# Patient Record
Sex: Male | Born: 1955 | Race: White | Hispanic: No | Smoking: Former smoker
Health system: Southern US, Community
[De-identification: ages and names within clinical notes are randomized; demographics above are authoritative.]

## PROBLEM LIST (undated history)

## (undated) DIAGNOSIS — G629 Polyneuropathy, unspecified: Secondary | ICD-10-CM

## (undated) DIAGNOSIS — E349 Endocrine disorder, unspecified: Secondary | ICD-10-CM

## (undated) DIAGNOSIS — T1491XA Suicide attempt, initial encounter: Secondary | ICD-10-CM

## (undated) DIAGNOSIS — E785 Hyperlipidemia, unspecified: Secondary | ICD-10-CM

## (undated) DIAGNOSIS — I10 Essential (primary) hypertension: Secondary | ICD-10-CM

## (undated) DIAGNOSIS — F329 Major depressive disorder, single episode, unspecified: Secondary | ICD-10-CM

## (undated) DIAGNOSIS — R0989 Other specified symptoms and signs involving the circulatory and respiratory systems: Secondary | ICD-10-CM

## (undated) DIAGNOSIS — A4902 Methicillin resistant Staphylococcus aureus infection, unspecified site: Secondary | ICD-10-CM

## (undated) DIAGNOSIS — E119 Type 2 diabetes mellitus without complications: Secondary | ICD-10-CM

## (undated) DIAGNOSIS — M25569 Pain in unspecified knee: Secondary | ICD-10-CM

## (undated) DIAGNOSIS — G8929 Other chronic pain: Secondary | ICD-10-CM

## (undated) DIAGNOSIS — F32A Depression, unspecified: Secondary | ICD-10-CM

## (undated) HISTORY — DX: Pain in unspecified knee: M25.569

## (undated) HISTORY — DX: Other specified symptoms and signs involving the circulatory and respiratory systems: R09.89

## (undated) HISTORY — DX: Methicillin resistant Staphylococcus aureus infection, unspecified site: A49.02

## (undated) HISTORY — DX: Essential (primary) hypertension: I10

## (undated) HISTORY — DX: Other chronic pain: G89.29

## (undated) HISTORY — PX: OTHER SURGICAL HISTORY: SHX169

## (undated) HISTORY — DX: Polyneuropathy, unspecified: G62.9

## (undated) HISTORY — DX: Suicide attempt, initial encounter: T14.91XA

## (undated) HISTORY — DX: Hyperlipidemia, unspecified: E78.5

## (undated) HISTORY — DX: Type 2 diabetes mellitus without complications: E11.9

## (undated) HISTORY — DX: Depression, unspecified: F32.A

## (undated) HISTORY — DX: Endocrine disorder, unspecified: E34.9

## (undated) HISTORY — DX: Major depressive disorder, single episode, unspecified: F32.9

---
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
## 1999-11-26 ENCOUNTER — Emergency Department (HOSPITAL_COMMUNITY): Admission: EM | Admit: 1999-11-26 | Discharge: 1999-11-26 | Payer: Self-pay | Admitting: Emergency Medicine

## 1999-11-28 ENCOUNTER — Encounter: Payer: Self-pay | Admitting: Urology

## 1999-11-28 ENCOUNTER — Encounter: Admission: RE | Admit: 1999-11-28 | Discharge: 1999-11-28 | Payer: Self-pay | Admitting: Urology

## 1999-12-05 ENCOUNTER — Encounter: Admission: RE | Admit: 1999-12-05 | Discharge: 1999-12-05 | Payer: Self-pay | Admitting: Urology

## 1999-12-05 ENCOUNTER — Encounter: Payer: Self-pay | Admitting: Urology

## 2000-02-03 ENCOUNTER — Encounter: Payer: Self-pay | Admitting: Urology

## 2000-02-03 ENCOUNTER — Inpatient Hospital Stay (HOSPITAL_COMMUNITY): Admission: EM | Admit: 2000-02-03 | Discharge: 2000-02-07 | Payer: Self-pay | Admitting: *Deleted

## 2000-02-04 ENCOUNTER — Encounter: Payer: Self-pay | Admitting: Urology

## 2000-02-18 ENCOUNTER — Ambulatory Visit (HOSPITAL_COMMUNITY): Admission: RE | Admit: 2000-02-18 | Discharge: 2000-02-18 | Payer: Self-pay | Admitting: Urology

## 2000-02-18 ENCOUNTER — Encounter: Payer: Self-pay | Admitting: Urology

## 2004-08-21 ENCOUNTER — Ambulatory Visit: Payer: Self-pay | Admitting: Family Medicine

## 2004-08-27 ENCOUNTER — Ambulatory Visit: Payer: Self-pay | Admitting: Family Medicine

## 2004-09-27 ENCOUNTER — Ambulatory Visit: Payer: Self-pay | Admitting: Family Medicine

## 2004-10-27 ENCOUNTER — Ambulatory Visit: Payer: Self-pay | Admitting: Family Medicine

## 2007-06-30 ENCOUNTER — Encounter: Payer: Self-pay | Admitting: Physician Assistant

## 2008-03-29 HISTORY — PX: COLONOSCOPY: SHX174

## 2008-04-06 ENCOUNTER — Encounter: Payer: Self-pay | Admitting: Internal Medicine

## 2008-04-06 ENCOUNTER — Ambulatory Visit (HOSPITAL_COMMUNITY): Admission: RE | Admit: 2008-04-06 | Discharge: 2008-04-06 | Payer: Self-pay | Admitting: Internal Medicine

## 2008-04-06 ENCOUNTER — Ambulatory Visit: Payer: Self-pay | Admitting: Internal Medicine

## 2008-12-10 ENCOUNTER — Observation Stay (HOSPITAL_COMMUNITY): Admission: EM | Admit: 2008-12-10 | Discharge: 2008-12-13 | Payer: Self-pay | Admitting: Emergency Medicine

## 2008-12-26 ENCOUNTER — Encounter: Payer: Self-pay | Admitting: Physician Assistant

## 2009-07-18 ENCOUNTER — Encounter: Payer: Self-pay | Admitting: Physician Assistant

## 2009-07-29 ENCOUNTER — Encounter: Payer: Self-pay | Admitting: Physician Assistant

## 2009-10-22 ENCOUNTER — Encounter: Payer: Self-pay | Admitting: Physician Assistant

## 2009-12-12 ENCOUNTER — Ambulatory Visit: Payer: Self-pay | Admitting: Family Medicine

## 2009-12-12 DIAGNOSIS — I739 Peripheral vascular disease, unspecified: Secondary | ICD-10-CM | POA: Insufficient documentation

## 2009-12-12 DIAGNOSIS — E785 Hyperlipidemia, unspecified: Secondary | ICD-10-CM | POA: Insufficient documentation

## 2009-12-12 DIAGNOSIS — I10 Essential (primary) hypertension: Secondary | ICD-10-CM | POA: Insufficient documentation

## 2009-12-12 DIAGNOSIS — E1169 Type 2 diabetes mellitus with other specified complication: Secondary | ICD-10-CM | POA: Insufficient documentation

## 2009-12-12 DIAGNOSIS — E669 Obesity, unspecified: Secondary | ICD-10-CM | POA: Insufficient documentation

## 2009-12-12 LAB — HM DIABETES FOOT EXAM

## 2009-12-13 ENCOUNTER — Encounter: Payer: Self-pay | Admitting: Physician Assistant

## 2009-12-19 ENCOUNTER — Telehealth: Payer: Self-pay | Admitting: Physician Assistant

## 2009-12-19 ENCOUNTER — Emergency Department (HOSPITAL_COMMUNITY): Admission: EM | Admit: 2009-12-19 | Discharge: 2009-12-19 | Payer: Self-pay | Admitting: Emergency Medicine

## 2009-12-20 ENCOUNTER — Ambulatory Visit: Payer: Self-pay | Admitting: Endocrinology

## 2009-12-20 ENCOUNTER — Telehealth: Payer: Self-pay | Admitting: Physician Assistant

## 2009-12-20 ENCOUNTER — Ambulatory Visit: Payer: Self-pay | Admitting: Family Medicine

## 2009-12-31 ENCOUNTER — Ambulatory Visit: Payer: Self-pay | Admitting: Endocrinology

## 2010-01-13 ENCOUNTER — Telehealth: Payer: Self-pay | Admitting: Family Medicine

## 2010-01-13 ENCOUNTER — Ambulatory Visit: Payer: Self-pay | Admitting: Family Medicine

## 2010-01-13 ENCOUNTER — Ambulatory Visit (HOSPITAL_COMMUNITY): Admission: RE | Admit: 2010-01-13 | Discharge: 2010-01-13 | Payer: Self-pay | Admitting: Family Medicine

## 2010-01-13 DIAGNOSIS — R0602 Shortness of breath: Secondary | ICD-10-CM | POA: Insufficient documentation

## 2010-01-13 DIAGNOSIS — M79609 Pain in unspecified limb: Secondary | ICD-10-CM | POA: Insufficient documentation

## 2010-01-13 DIAGNOSIS — I509 Heart failure, unspecified: Secondary | ICD-10-CM | POA: Insufficient documentation

## 2010-01-13 LAB — CONVERTED CEMR LAB
CO2: 18 meq/L — ABNORMAL LOW (ref 19–32)
Calcium: 8.9 mg/dL (ref 8.4–10.5)
Chloride: 106 meq/L (ref 96–112)
Creatinine, Ser: 0.87 mg/dL (ref 0.40–1.50)
Sodium: 140 meq/L (ref 135–145)

## 2010-01-14 ENCOUNTER — Ambulatory Visit (HOSPITAL_COMMUNITY): Admission: RE | Admit: 2010-01-14 | Discharge: 2010-01-14 | Payer: Self-pay | Admitting: Family Medicine

## 2010-01-21 ENCOUNTER — Telehealth: Payer: Self-pay | Admitting: Physician Assistant

## 2010-01-27 ENCOUNTER — Ambulatory Visit: Payer: Self-pay | Admitting: Family Medicine

## 2010-01-27 DIAGNOSIS — E291 Testicular hypofunction: Secondary | ICD-10-CM | POA: Insufficient documentation

## 2010-01-27 DIAGNOSIS — F341 Dysthymic disorder: Secondary | ICD-10-CM | POA: Insufficient documentation

## 2010-01-27 DIAGNOSIS — B029 Zoster without complications: Secondary | ICD-10-CM | POA: Insufficient documentation

## 2010-01-29 ENCOUNTER — Encounter: Payer: Self-pay | Admitting: Physician Assistant

## 2010-02-03 LAB — CONVERTED CEMR LAB
Cholesterol: 147 mg/dL (ref 0–200)
HDL: 36 mg/dL — ABNORMAL LOW (ref >39)
LDL Cholesterol: 62 mg/dL (ref 0–99)
Triglycerides: 246 mg/dL — ABNORMAL HIGH (ref <150)
VLDL: 49 mg/dL — ABNORMAL HIGH (ref 0–40)

## 2010-02-14 ENCOUNTER — Telehealth: Payer: Self-pay | Admitting: Physician Assistant

## 2010-02-28 ENCOUNTER — Ambulatory Visit: Payer: Self-pay | Admitting: Physician Assistant

## 2010-03-04 ENCOUNTER — Telehealth: Payer: Self-pay | Admitting: Family Medicine

## 2010-03-18 ENCOUNTER — Telehealth: Payer: Self-pay | Admitting: Physician Assistant

## 2010-03-24 ENCOUNTER — Ambulatory Visit: Payer: Self-pay | Admitting: Orthopedic Surgery

## 2010-03-24 DIAGNOSIS — G5600 Carpal tunnel syndrome, unspecified upper limb: Secondary | ICD-10-CM | POA: Insufficient documentation

## 2010-03-25 ENCOUNTER — Encounter (INDEPENDENT_AMBULATORY_CARE_PROVIDER_SITE_OTHER): Payer: Self-pay | Admitting: *Deleted

## 2010-03-31 ENCOUNTER — Encounter: Payer: Self-pay | Admitting: Orthopedic Surgery

## 2010-04-01 ENCOUNTER — Telehealth: Payer: Self-pay | Admitting: Physician Assistant

## 2010-04-01 ENCOUNTER — Ambulatory Visit: Payer: Self-pay | Admitting: Family Medicine

## 2010-04-01 DIAGNOSIS — R5383 Other fatigue: Secondary | ICD-10-CM

## 2010-04-01 DIAGNOSIS — R5381 Other malaise: Secondary | ICD-10-CM | POA: Insufficient documentation

## 2010-04-02 ENCOUNTER — Encounter: Payer: Self-pay | Admitting: Physician Assistant

## 2010-04-04 ENCOUNTER — Telehealth: Payer: Self-pay | Admitting: Physician Assistant

## 2010-04-10 ENCOUNTER — Telehealth: Payer: Self-pay | Admitting: Family Medicine

## 2010-05-07 ENCOUNTER — Ambulatory Visit: Payer: Self-pay | Admitting: Orthopedic Surgery

## 2010-05-12 ENCOUNTER — Ambulatory Visit: Payer: Self-pay | Admitting: Endocrinology

## 2010-05-12 LAB — CONVERTED CEMR LAB
Creatinine,U: 63.1 mg/dL
Microalb, Ur: 11.2 mg/dL — ABNORMAL HIGH (ref 0.0–1.9)

## 2010-05-13 ENCOUNTER — Telehealth: Payer: Self-pay | Admitting: Endocrinology

## 2010-05-14 ENCOUNTER — Telehealth: Payer: Self-pay | Admitting: Family Medicine

## 2010-05-16 ENCOUNTER — Telehealth: Payer: Self-pay | Admitting: Endocrinology

## 2010-07-03 ENCOUNTER — Telehealth (INDEPENDENT_AMBULATORY_CARE_PROVIDER_SITE_OTHER): Payer: Self-pay | Admitting: *Deleted

## 2010-07-07 ENCOUNTER — Telehealth: Payer: Self-pay | Admitting: Family Medicine

## 2010-07-07 DIAGNOSIS — G8929 Other chronic pain: Secondary | ICD-10-CM | POA: Insufficient documentation

## 2010-07-15 ENCOUNTER — Telehealth (INDEPENDENT_AMBULATORY_CARE_PROVIDER_SITE_OTHER): Payer: Self-pay | Admitting: *Deleted

## 2010-07-21 ENCOUNTER — Telehealth: Payer: Self-pay | Admitting: Endocrinology

## 2010-07-23 ENCOUNTER — Ambulatory Visit
Admission: RE | Admit: 2010-07-23 | Discharge: 2010-07-23 | Payer: Self-pay | Source: Home / Self Care | Attending: Family Medicine | Admitting: Family Medicine

## 2010-07-23 ENCOUNTER — Encounter: Payer: Self-pay | Admitting: Endocrinology

## 2010-07-23 ENCOUNTER — Encounter: Payer: Self-pay | Admitting: Family Medicine

## 2010-07-29 NOTE — Progress Notes (Signed)
Summary: medication  Phone Note Call from Patient   Summary of Call: needs a fluid pill. pt is swelling up. [facility] [phone] Initial call taken by: Rudene Anda,  April 10, 2010 9:56 AM  Follow-up for Phone Call        Advise pt that I prescribed furosemide for him.  This is the same water pill he was on previusly.  Also prescribed K+. He needs to take one on the days he takes the fluid pill Follow-up by: Esperanza Sheets PA,  April 10, 2010 1:07 PM  Additional Follow-up for Phone Call Additional follow up Details #1::        patient aware Additional Follow-up by: Everitt Amber LPN,  April 10, 2010 1:47 PM    New/Updated Medications: FUROSEMIDE 20 MG TABS (FUROSEMIDE) take one daily as needed for swelling KLOR-CON M20 20 MEQ CR-TABS (POTASSIUM CHLORIDE CRYS CR) take one daily one the days take furosemide Prescriptions: KLOR-CON M20 20 MEQ CR-TABS (POTASSIUM CHLORIDE CRYS CR) take one daily one the days take furosemide  #30 x 1   Entered by:   Esperanza Sheets PA   Authorized by:   Syliva Overman MD   Signed by:   Esperanza Sheets PA on 04/10/2010   Method used:   Electronically to        [facility]* (retail)       [address] [place], [place]  [address]       Ph: [phone]       Fax: [phone]   RxID:   [id number] FUROSEMIDE 20 MG TABS (FUROSEMIDE) take one daily as needed for swelling  #30 x 1   Entered by:   Esperanza Sheets PA   Authorized by:   Syliva Overman MD   Signed by:   Esperanza Sheets PA on 04/10/2010   Method used:   Electronically to        [facility] (retail)       [address] [place], [place]  [address]       Ph: [phone]       Fax: [phone]   RxID:   [id number]

## 2010-07-29 NOTE — Progress Notes (Signed)
Summary: refill  Phone Note Call from Patient   Summary of Call: machine is element infopia. sugar tester  [phone]  Initial call taken by: Rudene Anda,  April 01, 2010 2:18 PM  Follow-up for Phone Call        Can you write script for this/supplies.   Additional Follow-up for Phone Call Additional follow up Details #1::        Done Additional Follow-up by: Esperanza Sheets PA,  April 02, 2010 9:11 AM

## 2010-07-29 NOTE — Assessment & Plan Note (Signed)
Summary: office visit- room 1   Vital Signs:  Patient profile:   55 year old male Height:      71.5 inches Weight:      426 pounds BMI:     58.80 O2 Sat:      93 % on Room air Pulse rate:   87 / minute Resp:     16 per minute BP sitting:   110 / 60  (left arm)  Vitals Entered By: Adella Hare LPN (January 27, 2010 10:40 AM) CC: follow-up visit Is Patient Diabetic? Yes Did you bring your meter with you today? Yes Comments did not bring meds to ov   Referring Provider:  sampson  CC:  follow-up visit.  History of Present Illness: Nicholas [name] presents today for check up.  Nicholas [name] states that he is not sleeping well.  His depression and anxiety are bothering him.  Currently taking Citalopram 40 mg.  Had tried Prozac in the past, but didnt help.  No SI, or HI.  He is also on Lamictal.    Also has joint pains.  Knees, back & Rt thumb area. Hx of DJD.  Was using Tramadol in the past 3 times a day & worked well for him.  Nicholas [name] is seeing Endocrinologist for mgmt of Diabetes.  Blood sugars are improving though not controlled yet. He is going to the [facility] regularly for exercise. Also c/o a painful spot on his Rt back x 4 days.  Is worried that this is MRSA.  States he had  a MRSA infection yrs ago.  Nicholas [name] states this area is very painful.  Worse to touch, even leaning back against a chair.       Allergies (verified): No Known Drug Allergies  Past History:  Past medical history reviewed for relevance to current acute and chronic problems.  Past Medical History: Diabetes mellitus, type II Hyperlipidemia Testosterone deficency Hypertension Poor leg circulation MRSA Chronic knee pain Depression Glaucoma Hx of suicide attempt  Review of Systems General:  Denies chills and fever. MS:  Complains of joint pain and low back pain; denies joint redness and joint swelling. Derm:  Complains of lesion(s) and rash; denies itching. Psych:  Complains of anxiety and depression; denies suicidal  thoughts/plans and thoughts /plans of harming others.  Physical Exam  General:  Well-developed,well-nourished,in no acute distress; alert,appropriate and cooperative throughout examination Head:  Normocephalic and atraumatic without obvious abnormalities. No apparent alopecia or balding. Ears:  External ear exam shows no significant lesions or deformities.  Otoscopic examination reveals clear canals, tympanic membranes are intact bilaterally without bulging, retraction, inflammation or discharge. Hearing is grossly normal bilaterally. Nose:  External nasal examination shows no deformity or inflammation. Nasal mucosa are pink and moist without lesions or exudates. Mouth:  Oral mucosa and oropharynx without lesions or exudates.   Neck:  No deformities, masses, or tenderness noted. Lungs:  Normal respiratory effort, chest expands symmetrically. Lungs are clear to auscultation, no crackles or wheezes. Heart:  Normal rate and regular rhythm. S1 and S2 normal without gallop, murmur, click, rub or other extra sounds. Skin:  scabbed erythematous papules grouped, in linear distribution Rt posterior thorax, approx T7 dermatome  Erythma bilat anterior lower extremities, with scabbed area on Lt anterior shin Cervical Nodes:  No lymphadenopathy noted Psych:  Cognition and judgment appear intact. Alert and cooperative with normal attention span and concentration. No apparent delusions, illusions, hallucinations   Impression & Recommendations:  Problem # 1:  HERPES ZOSTER (ICD-053.9) Assessment New Discussed  shingles with Nicholas [name]. Rx Valacyclovir, and Lidoderm patches for pain.  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  Nicholas [name] declined stating his legs are getting better.  Problem # 4:  DIABETES MELLITUS,  TYPE II, UNCONTROLLED (ICD-250.02) Assessment: Comment Only  His updated medication list for this problem includes:    Metformin Hcl 1000 Mg Tabs (Metformin hcl) ..... One tab by mouth two times a day    Aspirin 81 Mg Tabs (Aspirin) ..... One tab by mouth once daily    Lisinopril 20 Mg Tabs (Lisinopril) ..... One tab by mouth once daily    Humulin 70/30 70-30 % Susp (Insulin isophane & regular) .Marland KitchenMarland KitchenMarland KitchenMarland Kitchen 170 units two times a day, and 1 cc syringes two times a day  Complete Medication List: 1)  Niaspan 750 Mg Cr-tabs (Niacin (antihyperlipidemic)) .... Two tabs by mouth at bedtime 2)  Metformin Hcl 1000 Mg Tabs (Metformin hcl) .... One tab by mouth two times a day 3)  Androgel Pump 1 % Gel (Testosterone) .... Apply four pumps once a day as directed 4)  Aspirin 81 Mg Tabs (Aspirin) .... One tab by mouth once daily 5)  Simvastatin 40 Mg Tabs (Simvastatin) .... One tab by mouth once daily 6)  Lisinopril 20 Mg Tabs (Lisinopril) .... One tab by mouth once daily 7)  Nabumetone 750 Mg Tabs (Nabumetone) .... One tab by mouth two times a day 8)  Lamotrigine 200 Mg Tabs (Lamotrigine) .... One tab by mouth once daily 9)  Combigan 0.2-0.5 % Soln (Brimonidine tartrate-timolol) .... One drop in each eye two times a day 10)  Bd Pen Needle Short U/f 31g X 8 Mm Misc (Insulin pen needle) .... Use two times a day as directed 11)  Humulin 70/30 70-30 % Susp (Insulin isophane & regular) .Marland KitchenMarland Kitchen. 170 units two times a day, and 1 cc syringes two times a day 12)  Citalopram Hydrobromide 40 Mg Tabs (Citalopram hydrobromide) .Marland Kitchen.. 1 tablet once daily 13)  Tramadol Hcl 50 Mg Tabs (Tramadol hcl) .... Take 1 tablet by mouth three times a day as needed 14)  Furosemide 40 Mg Tabs (Furosemide) .... Take 1 tablet by mouth two times a day 15)  Potassium Chloride Crys Cr 20 Meq Cr-tabs (Potassium chloride crys cr) .... Take 1 tablet by mouth two times a day 16)  Valacyclovir Hcl 1 Gm Tabs (Valacyclovir hcl) .... Take 1 three times a  day x 7 days for shingles 17)  Lidoderm 5 % Ptch (Lidocaine) .... Apply patch to shingles x 12 hrs, then off x 12 hrs.  Other Orders: Glucose, (CBG) ([address]) T-Testosterone; Total [phone]) T-Lipid Profile [phone])  Patient Instructions: 1)  Please schedule a follow-up appointment in 1 month. 2)  I have referred you to mental health. 3)  I have refilled your Tramadol for pain. 4)  I have prescribed medication for your shingles. 5)  Continue seeing Dr Everardo All for your diabetes. Prescriptions: LIDODERM 5 % PTCH (LIDOCAINE) apply patch to shingles x 12 hrs, then off x 12 hrs.  #10 patches x 0   Entered and Authorized by:   Esperanza Sheets PA   Signed by:   Esperanza Sheets PA on 01/27/2010   Method used:   Electronically to        [facility] (retail)       [address]  [place], [place]  [address]       Ph: [phone]       Fax: [phone]   RxID:   [id number] TRAMADOL HCL 50 MG TABS (TRAMADOL HCL) Take 1 tablet by mouth three times a day as needed  #90 x 1   Entered and Authorized by:   Esperanza Sheets PA   Signed by:   Esperanza Sheets PA on 01/27/2010   Method used:   Electronically to        [facility] (retail)       [address]       [place], [place]  [address]       Ph: [phone]       Fax: [phone]   RxID:   [id number] VALACYCLOVIR HCL 1 GM TABS (VALACYCLOVIR HCL) take 1 three times a day x 7 days for shingles  #21 x 0   Entered and Authorized by:   Esperanza Sheets PA   Signed by:   Esperanza Sheets PA on 01/27/2010   Method used:   Electronically to        [facility] (retail)       [address]       [place], [place]  [address]       Ph: [phone]       Fax: [phone]   RxID:   [id number]   Laboratory Results   Blood Tests   Date/Time Received: January 27, 2010 10:41 AM  Date/Time Reported: January 27, 2010 10:41  AM   Glucose (random): 229 mg/dL   (Normal Range: 51-761)

## 2010-07-29 NOTE — Progress Notes (Signed)
Summary: insulin rx  Phone Note From Pharmacy   Caller: [facility]* Details for Reason: New Rx Summary of Call: [facility] sent fax to clarify new rx for pt's insulin. They want to verify that the units are correct and that the pt is to receive vials instead of pens. Initial call taken by: Brenton Grills CMA Duncan Dull),  May 13, 2010 10:03 AM  Follow-up for Phone Call        yes.  i have reduced to 170 units am adn 160 pm Follow-up by: Minus Breeding MD,  May 13, 2010 12:32 PM  Additional Follow-up for Phone Call Additional follow up Details #1::        Pharmacist called and informed of new rx Additional Follow-up by: Brenton Grills CMA Duncan Dull),  May 13, 2010 2:23 PM    New/Updated Medications: HUMULIN 70/30 70-30 % SUSP (INSULIN ISOPHANE & REGULAR) 170 units with breakfast, and 160 with evening meal), and 1 cc syringes (2 syringes, two times a day) Prescriptions: HUMULIN 70/30 70-30 % SUSP (INSULIN ISOPHANE & REGULAR) 170 units with breakfast, and 160 with evening meal), and 1 cc syringes (2 syringes, two times a day)  #13 vials x 11   Entered and Authorized by:   Minus Breeding MD   Signed by:   Minus Breeding MD on 05/13/2010   Method used:   Electronically to        [facility] (retail)       [address] [place], [place]  [address]       Ph: [phone]       Fax: [phone]   RxID:   [id number]

## 2010-07-29 NOTE — Assessment & Plan Note (Signed)
Summary: f/u appt/#/cd   Vital Signs:  Patient profile:   55 year old male Height:      71.5 inches (181.61 cm) Weight:      454.38 pounds (206.54 kg) BMI:     62.72 O2 Sat:      93 % on Room air Temp:     98.5 degrees F (36.94 degrees C) oral Pulse rate:   97 / minute BP sitting:   120 / 70  (left arm) Cuff size:   large  Vitals Entered By: Brenton Grills CMA Duncan Dull) (May 12, 2010 1:09 PM)  O2 Flow:  Room air CC: Follow-up visit/pt is no longer takign Furosemide, Postassium Chloride or Meloxicam/aj Is Patient Diabetic? Yes   Referring Provider:  Esperanza Sheets, PA Primary Provider:  Esperanza Sheets PA  CC:  Follow-up visit/pt is no longer takign Furosemide and Postassium Chloride or Meloxicam/aj.  History of Present Illness: no cbg record, but states cbg's are sometimes mildly low in the morning.  he otherwise does not know what time of day. pt asks about getting refill for pain meds.  i have advised him to direct this request to dr simpson.  Current Medications (verified): 1)  Niaspan 750 Mg Cr-Tabs (Niacin (Antihyperlipidemic)) .... Two Tabs By Mouth At Bedtime 2)  Metformin Hcl 1000 Mg Tabs (Metformin Hcl) .... One Tab By Mouth Two Times A Day 3)  Androgel Pump 1 % Gel (Testosterone) .... Apply 3 Pumps Once A Day As Directed 4)  Aspirin 81 Mg Tabs (Aspirin) .... One Tab By Mouth Once Daily 5)  Simvastatin 40 Mg Tabs (Simvastatin) .... One Tab By Mouth Once Daily 6)  Lisinopril 20 Mg Tabs (Lisinopril) .... One Tab By Mouth Once Daily 7)  Nabumetone 750 Mg Tabs (Nabumetone) .... One Tab By Mouth Two Times A Day 8)  Lamotrigine 200 Mg Tabs (Lamotrigine) .... One Tab By Mouth Once Daily 9)  Combigan 0.2-0.5 % Soln (Brimonidine Tartrate-Timolol) .... One Drop in Each Eye Two Times A Day 10)  Bd Pen Needle Short U/f 31g X 8 Mm Misc (Insulin Pen Needle) .... Use Two Times A Day As Directed 11)  Humulin 70/30 70-30 % Susp (Insulin Isophane & Regular) .Marland KitchenMarland KitchenMarland Kitchen 170 Units Two Times A  Day, and 1 Cc Syringes Two Times A Day 12)  Citalopram Hydrobromide 40 Mg Tabs (Citalopram Hydrobromide) .Marland Kitchen.. 1 Tablet Once Daily 13)  Tramadol Hcl 50 Mg Tabs (Tramadol Hcl) .... Take 1 Tablet By Mouth Three Times A Day As Needed 14)  Meloxicam 15 Mg Tabs (Meloxicam) .... Take 1 Daily For Arm Pain 15)  Hydrocodone-Acetaminophen 7.5-325 Mg Tabs (Hydrocodone-Acetaminophen) .... Take One Tablet Two Times A Day For Carpal Tunnel Pain 16)  Neurontin 100 Mg Caps (Gabapentin) .Marland Kitchen.. 1 By Mouth Three Times A Day 17)  Alphagan P 0.1 % Soln (Brimonidine Tartrate) .... One Drop in Each Eye Two Times A Day 18)  Furosemide 20 Mg Tabs (Furosemide) .... Take One Daily As Needed For Swelling 19)  Klor-Con M20 20 Meq Cr-Tabs (Potassium Chloride Crys Cr) .... Take One Daily One The Days Take Furosemide 20)  Alphagan P 0.15 % Soln (Brimonidine Tartrate) .Marland Kitchen.. 1 Drop in Both Eyes Two Times A Day 21)  Vitamin B-6 50 Mg Tabs (Pyridoxine Hcl) .Marland Kitchen.. 1 By Mouth Two Times A Day  Allergies (verified): No Known Drug Allergies  Past History:  Past Medical History: Last updated: 01/27/2010 Diabetes mellitus, type II Hyperlipidemia Testosterone deficency Hypertension Poor leg circulation MRSA Chronic knee pain Depression  Glaucoma Hx of suicide attempt  Review of Systems  The patient denies syncope.    Physical Exam  General:  morbidly obese.  no distress  Pulses:  dorsalis pedis intact bilat, but decreased from normal.  Extremities:  no deformity.  no ulcer on the feet.  feet are of normal temp, but there are hyperpigmented patches (healed ulcers) at the legs.  no edema.  there are bilteral varicosities.  mycotic toenails.   Neurologic:  sensation is intact to touch on the feet, but decreased from normal. Additional Exam:  Hemoglobin A1C       [H]  7.0 %    Impression & Recommendations:  Problem # 1:  DIABETES MELLITUS, TYPE II, UNCONTROLLED (ICD-250.02) this control is too aggressive, given this  regimen, chosen for its simplitity, which does match insulin to her changing needs throughout the day  Medications Added to Medication List This Visit: 1)  Humulin 70/30 70-30 % Susp (Insulin isophane & regular) .Marland KitchenMarland Kitchen. 170 units two times a day, and 1 cc syringes 2-two times a day 2)  Alphagan P 0.15 % Soln (Brimonidine tartrate) .Marland Kitchen.. 1 drop in both eyes two times a day 3)  Vitamin B-6 50 Mg Tabs (Pyridoxine hcl) .Marland Kitchen.. 1 by mouth two times a day  Other Orders: TLB-A1C / Hgb A1C (Glycohemoglobin) (83036-A1C) TLB-Microalbumin/Creat Ratio, Urine (82043-MALB) Est. Patient Level III ([address])  Patient Instructions: 1)  check your blood sugar 2 times a day.  vary the time of day when you check, between before the 3 meals, and at bedtime.  also check if you have symptoms of your blood sugar being too high or too low.  please keep a record of the readings and bring it to your next appointment here.  please call us sooner if you are having low blood sugar episodes.   2)  blood tests are being ordered for you today.  please call [phone] to hear your test results. 3)  pending the test results, please continue humulin 70/30 to 170 units two times a day (just before the first and last meals of the day).   4)  Please schedule a follow-up appointment in 1 month.   5)  (update: i left message on phone tree: reduce pm insulin to 160 units) Prescriptions: HUMULIN 70/30 70-30 % SUSP (INSULIN ISOPHANE & REGULAR) 170 units two times a day, and 1 cc syringes 2-two times a day  #7 vials x 11   Entered and Authorized by:   Minus Breeding MD   Signed by:   Minus Breeding MD on 05/12/2010   Method used:   Electronically to        [facility] (retail)       [address]       [place], [place]  [address]       Ph: [phone]       Fax: [phone]   RxID:   [id number]    Orders Added: 1)  TLB-A1C / Hgb A1C (Glycohemoglobin) [83036-A1C] 2)  TLB-Microalbumin/Creat Ratio,  Urine [82043-MALB] 3)  Est. Patient Level III [[address]]

## 2010-07-29 NOTE — Progress Notes (Signed)
Summary: insulin & syringes  Phone Note Call from Patient Call back at Home Phone [phone]   Caller: Patient Call For: Dr. Everardo All Reason for Call: Talk to Nurse Summary of Call: Pt left msg on vm saw md on Monday was suppose to send in rx for his strips and insulin. Per pharamcy have'nt recieved. Requesting call back Initial call taken by: Orlan Leavens RMA,  May 16, 2010 3:00 PM  Follow-up for Phone Call        Called pt back he states pharmacy didnt recieved rx for insulin & syringes. Let hin know will resend to [facility] Follow-up by: Orlan Leavens RMA,  May 16, 2010 3:04 PM    Prescriptions: HUMULIN 70/30 70-30 % SUSP (INSULIN ISOPHANE & REGULAR) 170 units with breakfast, and 160 with evening meal), and 1 cc syringes (2 syringes, two times a day)  #13 vials x 11   Entered by:   Orlan Leavens RMA   Authorized by:   Minus Breeding MD   Signed by:   Orlan Leavens RMA on 05/16/2010   Method used:   Faxed to ...       [facility] (retail)       [address]       [place], [place]  [address]       Ph: [phone]       Fax: [phone]   RxID:   [id number] BD PEN NEEDLE SHORT U/F 31G X 8 MM MISC (INSULIN PEN NEEDLE) use two times a day as directed  #2 boxes x 5   Entered by:   Orlan Leavens RMA   Authorized by:   Minus Breeding MD   Signed by:   Orlan Leavens RMA on 05/16/2010   Method used:   Faxed to ...       [facility] (retail)       [address]       [place], [place]  [address]       Ph: [phone]       Fax: [phone]   RxID:   [id number]

## 2010-07-29 NOTE — Miscellaneous (Signed)
Summary: Immunizations  Immunizations   Imported By: Lind Guest 12/18/2009 11:38:06  _____________________________________________________________________  External Attachment:    Type:   Image     Comment:   External Document

## 2010-07-29 NOTE — Assessment & Plan Note (Signed)
Summary: 6 WK RE-CK FOL'G NCS/UHC/CAF   Visit Type:  Follow-up Referring Provider:  Esperanza Sheets, PA Primary Provider:  Esperanza Sheets PA  CC:  recheck cts.  History of Present Illness:  I saw Nicholas [name] in the office today for a followup visit.  He is a 55 years old man with the complaint of:  CTS.  NCS for review in EMR.  Today is 6 week recheck after Bracing, B6, Neurontin 100 mg three times a day and NCS.  Stil some numbness in fingers, his arm and wrist does not hurt anymore.  80 percent better.  He does have some basal joint arthritis symptoms as well, which we will treat with exercise.  He is basically 80% better as he said, think we can continue his medications and exercise, as well as splinting.     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 Patient Level II ([address])  Patient Instructions: 1)  CONTINUE MEDS AND BRACING  2)  RETURN AS NEEDED  3)  CALL FOR REFILLS AS NEEDED   Orders Added: 1)  Est. Patient Level II [[address]]

## 2010-07-29 NOTE — Assessment & Plan Note (Signed)
Summary: follow up- bilateral arm pain- room 1   Vital Signs:  Patient profile:   55 year old male Height:      71.5 inches Weight:      443.50 pounds BMI:     61.21 O2 Sat:      97 % on Room air Pulse rate:   69 / minute Resp:     16 per minute BP sitting:   100 / 60  (left arm)  Vitals Entered By: Adella Hare LPN (February 28, 2010 10:08 AM)  Nutrition Counseling: Patient's BMI is greater than 25 and therefore counseled on weight management options. CC: numbness, tingling, and pain in both arms Is Patient Diabetic? Yes Comments did not bring meds to ov   Referring Provider:  sampson  CC:  numbness, tingling, and and pain in both arms.  History of Present Illness: Pt reports numbness and tingling in bilat hands and wrists x 2-3 weeks.  + HS awakening.  also worse when drawing up insulin into syringe.  No neck pain.  No previous hx of .  No trauma.  Hx of DM.  Is seeing endocrinologist for mgmt. Pt brought BS readings for the last couple weeks with him.  His blood sugars have definitely improved, but he is not controlled yet.  Still is having issues with proper diet and eating regulary due to financial circumstances. Hx of htn. Taking medications as prescribed and denies side effects.  No headache, chest pain or palpitations. Depression is doing better.  Taking meds as prescribed.  No HI or SI.   Allergies (verified): No Known Drug Allergies  Past History:  Past medical history reviewed for relevance to current acute and chronic problems.  Past Medical History: Reviewed history from 01/27/2010 and no changes required. Diabetes mellitus, type II Hyperlipidemia Testosterone deficency Hypertension Poor leg circulation MRSA Chronic knee pain Depression Glaucoma Hx of suicide attempt  Review of Systems CV:  Denies chest pain or discomfort, palpitations, and swelling of hands. Resp:  Denies shortness of breath. MS:  Complains of muscle aches; denies joint pain,  joint redness, joint swelling, and thoracic pain. Neuro:  Complains of numbness and tingling.  Physical Exam  General:  Well-developed,well-nourished,in no acute distress; alert,appropriate and cooperative throughout examination Head:  Normocephalic and atraumatic without obvious abnormalities. No apparent alopecia or balding. Ears:  External ear exam shows no significant lesions or deformities.  Otoscopic examination reveals clear canals, tympanic membranes are intact bilaterally without bulging, retraction, inflammation or discharge. Hearing is grossly normal bilaterally. Nose:  External nasal examination shows no deformity or inflammation. Nasal mucosa are pink and moist without lesions or exudates. Mouth:  Oral mucosa and oropharynx without lesions or exudates.  Neck:  No deformities, masses, or tenderness noted. Lungs:  Normal respiratory effort, chest expands symmetrically. Lungs are clear to auscultation, no crackles or wheezes. Heart:  Normal rate and regular rhythm. S1 and S2 normal without gallop, murmur, click, rub or other extra sounds. Msk:  Bilat UE:  normal ROM and no joint swelling.  Pt does report some mild TTP Rt lateral and medial epicondyles of elbow.  Also has reproducable pain in ventral forearm with resistance to internal rotation of forearm/elbow. Pulses:  R radial normal and L radial normal.  Cap refill < 3 sec. Extremities:  No clubbing, cyanosis, edema, or deformity noted with normal full range of motion of all joints.   Neurologic:  sensation intact to light touch and DTRs symmetrical and normal.  Neg Tinnels  and Phalens bilat. Cervical Nodes:  No lymphadenopathy noted Psych:  Cognition and judgment appear intact. Alert and cooperative with normal attention span and concentration. No apparent delusions, illusions, hallucinations   Impression & Recommendations:  Problem # 1:  ARM PAIN (ICD-729.5) Assessment New  Bilat.  Probable tendonitis.     Orders: Orthopedic Referral (Ortho) Depo- Medrol 80mg  (J1040) Ketorolac-Toradol 15mg  (Y8657) Admin of Therapeutic Inj  intramuscular or subcutaneous (84696)  Problem # 2:  DIABETES MELLITUS, TYPE II, UNCONTROLLED (ICD-250.02) Assessment: Comment Only Improving.  Will continue f/u with endocrinologist.  His updated medication list for this problem includes:    Metformin Hcl 1000 Mg Tabs (Metformin hcl) ..... One tab by mouth two times a day    Aspirin 81 Mg Tabs (Aspirin) ..... One tab by mouth once daily    Lisinopril 20 Mg Tabs (Lisinopril) ..... One tab by mouth once daily    Humulin 70/30 70-30 % Susp (Insulin isophane & regular) .Marland KitchenMarland KitchenMarland KitchenMarland Kitchen 170 units two times a day, and 1 cc syringes two times a day  Problem # 3:  HYPERTENSION (ICD-401.9) Assessment: Comment Only  His updated medication list for this problem includes:    Lisinopril 20 Mg Tabs (Lisinopril) ..... One tab by mouth once daily    Furosemide 40 Mg Tabs (Furosemide) .Marland Kitchen... Take 1 tablet by mouth two times a day  BP today: 100/60 Prior BP: 110/60 (01/27/2010)  Labs Reviewed: K+: 5.0 (01/13/2010) Creat: : 0.87 (01/13/2010)   Chol: 147 (01/29/2010)   HDL: 36 (01/29/2010)   LDL: 62 (01/29/2010)   TG: 246 (01/29/2010)  Problem # 4:  DEPRESSION/ANXIETY (ICD-300.4) Assessment: Improved Pt states he is doing much better emotionally this mos.  He continues to go to the [facility] for exercise.  Is saddened by his weight and financial situation.  Complete Medication List: 1)  Niaspan 750 Mg Cr-tabs (Niacin (antihyperlipidemic)) .... Two tabs by mouth at bedtime 2)  Metformin Hcl 1000 Mg Tabs (Metformin hcl) .... One tab by mouth two times a day 3)  Androgel Pump 1 % Gel (Testosterone) .... Apply 3 pumps once a day as directed 4)  Aspirin 81 Mg Tabs (Aspirin) .... One tab by mouth once daily 5)  Simvastatin 40 Mg Tabs (Simvastatin) .... One tab by mouth once daily 6)  Lisinopril 20 Mg Tabs (Lisinopril) .... One tab by mouth once  daily 7)  Nabumetone 750 Mg Tabs (Nabumetone) .... One tab by mouth two times a day 8)  Lamotrigine 200 Mg Tabs (Lamotrigine) .... One tab by mouth once daily 9)  Combigan 0.2-0.5 % Soln (Brimonidine tartrate-timolol) .... One drop in each eye two times a day 10)  Bd Pen Needle Short U/f 31g X 8 Mm Misc (Insulin pen needle) .... Use two times a day as directed 11)  Humulin 70/30 70-30 % Susp (Insulin isophane & regular) .Marland KitchenMarland Kitchen. 170 units two times a day, and 1 cc syringes two times a day 12)  Citalopram Hydrobromide 40 Mg Tabs (Citalopram hydrobromide) .Marland Kitchen.. 1 tablet once daily 13)  Tramadol Hcl 50 Mg Tabs (Tramadol hcl) .... Take 1 tablet by mouth three times a day as needed 14)  Furosemide 40 Mg Tabs (Furosemide) .... Take 1 tablet by mouth two times a day 15)  Potassium Chloride Crys Cr 20 Meq Cr-tabs (Potassium chloride crys cr) .... Take 1 tablet by mouth two times a day 16)  Meloxicam 15 Mg Tabs (Meloxicam) .... Take 1 daily for arm pain  Patient Instructions: 1)  Please schedule a follow-up  appointment in 1 month. 2)  You have received Depo Medrol and Toradol injections today to help with your arm pain. 3)  I have prescribed Meloxicam also for your arms 4)  I am referring you to an orthopedic dr for evaluation. 5)  Continue follow up with Dr Everardo All. Prescriptions: MELOXICAM 15 MG TABS (MELOXICAM) take 1 daily for arm pain  #30 x 1   Entered and Authorized by:   Esperanza Sheets PA   Signed by:   Esperanza Sheets PA on 02/28/2010   Method used:   Electronically to        [facility] (retail)       [address] [place], [place]  [address]       Ph: [phone]       Fax: [phone]   RxID:   [id number]      Medication Administration  Injection # 1:    Medication: Depo- Medrol 80mg     Diagnosis: ARM PAIN (ICD-729.5)    Route: IM    Site: RUOQ gluteus    Exp Date: 09/2010    Lot #: [id number]    Mfr: Pharmacia    Comments: 80mg  given      Patient tolerated injection without complications    Given by: Everitt Amber LPN (February 28, 2010 11:19 AM)  Injection # 2:    Medication: Ketorolac-Toradol 15mg     Diagnosis: ARM PAIN (ICD-729.5)    Route: IM    Site: LUOQ gluteus    Exp Date: 08/2011    Lot #: [id number]     Mfr: [email or web address]    Comments: 60mg  given     Patient tolerated injection without complications    Given by: Everitt Amber LPN (February 28, 2010 11:20 AM)  Orders Added: 1)  Orthopedic Referral [Ortho] 2)  Depo- Medrol 80mg  [J1040] 3)  Ketorolac-Toradol 15mg  [J1885] 4)  Admin of Therapeutic Inj  intramuscular or subcutaneous [96372] 5)  Est. Patient Level IV [47425]

## 2010-07-29 NOTE — Assessment & Plan Note (Signed)
Summary: new patient - room 1   Vital Signs:  Patient profile:   55 year old male Height:      71.75 inches Weight:      415.75 pounds BMI:     56.98 O2 Sat:      97 % on Room air Pulse rate:   72 / minute Resp:     16 per minute BP sitting:   100 / 60  (left arm)  Vitals Entered By: Adella Hare LPN (December 12, 2009 9:58 AM)  Nutrition Counseling: Patient's BMI is greater than 25 and therefore counseled on weight management options. CC: new patient Is Patient Diabetic? Yes Did you bring your meter with you today? No Pain Assessment Patient in pain? yes     Location: left knee Intensity: 9 Type: sharp and aches Onset of pain  Chronic   CC:  new patient.  History of Present Illness: New pt here to establish care with new PCP.  Pt is concerned about his diabtetes.  His blood sugars run in the 400's & 500's. Has been in the 600's as recent as a last month. Pt brough his recent BS readings.  See scanned copy. Currently on Metformin and Levemir.  Was on one other medicine about 3 yrs ago but doesnt remember the name of it. He exercises at the [facility].  Frequency varies.  Not going regularly recently.  Pt states he has had decreased circulation bilat LE.  He has had testing in the past.  This is part of the reason he is on disability.  Will need to get previous records.  Pt denies pains in LE even with ambulation.  Hx of htn. Taking medications as prescribed. No headache, chest pain or palpitations.  Hx of depression.  Taking Lamictal.  Admits has problems with road rage.  No prev psychiatrist eval.  c/o fatigue.  sleep ok. He is interested in bariatric surgery. Pt states he has blood work done approx 1 mos ago.  Will try to get records.   Current Medications (verified): 1)  Niaspan 750 Mg Cr-Tabs (Niacin (Antihyperlipidemic)) .... Two Tabs By Mouth At Bedtime 2)  Metformin Hcl 1000 Mg Tabs (Metformin Hcl) .... One Tab By Mouth Two Times A Day 3)  Androgel Pump 1 % Gel  (Testosterone) .... Apply Four Pumps Once A Day As Directed 4)  Levemir Flexpen 100 Unit/ml Soln (Insulin Detemir) .... Inject 62 Units Subcutaneously At Bedtime As Directed 5)  Aspirin 81 Mg Tabs (Aspirin) .... One Tab By Mouth Once Daily 6)  Simvastatin 40 Mg Tabs (Simvastatin) .... One Tab By Mouth Once Daily 7)  Lisinopril 20 Mg Tabs (Lisinopril) .... One Tab By Mouth Once Daily 8)  Nabumetone 750 Mg Tabs (Nabumetone) .... One Tab By Mouth Two Times A Day 9)  Lamotrigine 200 Mg Tabs (Lamotrigine) .... One Tab By Mouth Once Daily 10)  Combigan 0.2-0.5 % Soln (Brimonidine Tartrate-Timolol) .... One Drop in Each Eye Two Times A Day  Allergies (verified): No Known Drug Allergies  Past History:  Past medical, surgical, family and social histories (including risk factors) reviewed for relevance to current acute and chronic problems.  Past Medical History: Diabetes mellitus, type II Hyperlipidemia Testosterone deficency Hypertension Poor leg circulation MRSA Chronic knee pain Depression Glaucoma  Past Surgical History: Surgery for kidney stones  Family History: Reviewed history and no changes required. mother deceased- DM, Leukemia father deceased- Heart dz, colon cancer, brain hemmorage, parkinsons, altzheimers, glacoma 5 brothers- health status unknown 5 sisters-  health status unknown  Social History: Reviewed history and no changes required. Disabled  since 2004 or 2005 Separated no dependents Never Smoked Alcohol use-no Drug use-no Regular exercise-yes- [employer] Status:  never Drug Use:  no Does Patient Exercise:  yes  Review of Systems ENT:  Denies earache, nasal congestion, and sore throat. CV:  Denies chest pain or discomfort and palpitations. Resp:  Denies cough and shortness of breath. GI:  Denies abdominal pain, change in bowel habits, indigestion, nausea, and vomiting. Psych:  Complains of depression and easily angered; denies suicidal thoughts/plans  and thoughts /plans of harming others.  Physical Exam  General:  Well-developed,well-nourished,in no acute distress; alert,appropriate and cooperative throughout examination Head:  Normocephalic and atraumatic without obvious abnormalities. No apparent alopecia or balding. Ears:  External ear exam shows no significant lesions or deformities.  Otoscopic examination reveals clear canals, tympanic membranes are intact bilaterally without bulging, retraction, inflammation or discharge. Hearing is grossly normal bilaterally. Nose:  External nasal examination shows no deformity or inflammation. Nasal mucosa are pink and moist without lesions or exudates. Mouth:  Oral mucosa and oropharynx without lesions or exudates.  Neck:  No deformities, masses, or tenderness noted. Lungs:  Normal respiratory effort, chest expands symmetrically. Lungs are clear to auscultation, no crackles or wheezes. Heart:  Normal rate and regular rhythm. S1 and S2 normal without gallop, murmur, click, rub or other extra sounds. Pulses:  R posterior tibial absent, R dorsalis pedis absent, L posterior tibial absent, and L dorsalis pedis absent.   Extremities:  Bilat feet cool to touch.  No PTE. Neurologic:  alert & oriented X3, sensation intact to light touch, and gait normal.   Skin:  Purplish hyperpigmentation anterior bilateral lower legs.  Cervical Nodes:  No lymphadenopathy noted Psych:  Cognition and judgment appear intact. Alert and cooperative with normal attention span and concentration. No apparent delusions, illusions, hallucinations  Diabetes Management Exam:    Foot Exam (with socks and/or shoes not present):       Sensory-Monofilament:          Left foot: diminished          Right foot: diminished       Sensory-other: Decreased sensation bilat great toes to monofilament.       Inspection:          Left foot: normal          Right foot: normal       Nails:          Left foot: normal          Right foot:  normal   Impression & Recommendations:  Problem # 1:  DIABETES MELLITUS, TYPE II (ICD-250.00) Assessment Comment Only Previous records & recent labs requested. Discussed current meds & initiation of Humulin 70/30 with Dr Lodema Hong.  His updated medication list for this problem includes:    Metformin Hcl 1000 Mg Tabs (Metformin hcl) ..... One tab by mouth two times a day    Levemir Flexpen 100 Unit/ml Soln (Insulin detemir) ..... Inject 62 units subcutaneously at bedtime as directed    Aspirin 81 Mg Tabs (Aspirin) ..... One tab by mouth once daily    Lisinopril 20 Mg Tabs (Lisinopril) ..... One tab by mouth once daily    Humulin 70/30 Pen 70-30 % Susp (Insulin isophane & regular) ..... Use 7 units two times a day as directed  Orders: Endocrinology Referral (Endocrine) Misc. Referral (Misc. 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  Has been the same x yrs. Requested previous records.  Suspect pt may need another arterial doppler exam.  Problem # 3:  HYPERTENSION (ICD-401.9) Assessment: Improved  His updated medication list for this problem includes:    Lisinopril 20 Mg Tabs (Lisinopril) ..... One tab by mouth once daily  Orders: Misc. Referral (Misc. Ref)  BP today: 100/60  Problem # 4:  MORBID OBESITY (ICD-278.01)  Orders: Misc. Referral (Misc. Ref)  Ht: 71.75 (12/12/2009)   Wt: 415.75 (12/12/2009)   BMI: 56.98 (12/12/2009)  Complete Medication List: 1)  Niaspan 750 Mg Cr-tabs (Niacin (antihyperlipidemic)) .... Two tabs by mouth at bedtime 2)  Metformin Hcl 1000 Mg Tabs (Metformin hcl) .... One tab by mouth two times a day 3)  Androgel Pump 1 % Gel (Testosterone) .... Apply four pumps once a day as directed 4)  Levemir Flexpen 100 Unit/ml Soln (Insulin detemir) .... Inject 62 units subcutaneously at bedtime as directed 5)  Aspirin 81 Mg Tabs (Aspirin) .... One tab  by mouth once daily 6)  Simvastatin 40 Mg Tabs (Simvastatin) .... One tab by mouth once daily 7)  Lisinopril 20 Mg Tabs (Lisinopril) .... One tab by mouth once daily 8)  Nabumetone 750 Mg Tabs (Nabumetone) .... One tab by mouth two times a day 9)  Lamotrigine 200 Mg Tabs (Lamotrigine) .... One tab by mouth once daily 10)  Combigan 0.2-0.5 % Soln (Brimonidine tartrate-timolol) .... One drop in each eye two times a day 11)  Humulin 70/30 Pen 70-30 % Susp (Insulin isophane & regular) .... Use 7 units two times a day as directed 12)  Bd Pen Needle Short U/f 31g X 8 Mm Misc (Insulin pen needle) .... Use two times a day as directed  Patient Instructions: 1)  Please schedule a follow-up appointment in 2 weeks. 2)  It is important that you exercise regularly at least 20 minutes 5 times a week. If you develop chest pain, have severe difficulty breathing, or feel very tired , stop exercising immediately and seek medical attention. 3)  You need to lose weight. Consider a lower calorie diet and regular exercise.  4)  I have added Humulin 70/30 to use 3 units before breakfast and lunch. 5)  Continue to check your blood sugars regularly. 6)  I have referred you to endocrinology (diabetes specialist) and for bariatric surgery consultation. Prescriptions: BD PEN NEEDLE SHORT U/F 31G X 8 MM MISC (INSULIN PEN NEEDLE) use two times a day as directed  #100 x 2   Entered by:   Adella Hare LPN   Authorized by:   Esperanza Sheets PA   Signed by:   Adella Hare LPN on 16/03/9603   Method used:   Electronically to        [facility] (retail)       [address]       [place], [place]  [address]       Ph: [phone]       Fax: [phone]   RxID:   [id number] HUMULIN 70/30 PEN 70-30 % SUSP (INSULIN ISOPHANE & REGULAR) use 7 units two times a day as directed  #1 mos x 2   Entered and Authorized by:   Esperanza Sheets PA   Signed by:   Esperanza Sheets PA on 12/12/2009   Method  used:   Electronically to  [facility] (retail)       [address]       [place], [place]  [address]       Ph: [phone]       Fax: [phone]   RxID:   [id number]

## 2010-07-29 NOTE — Letter (Signed)
Summary: TEST STRIPS  TEST STRIPS   Imported By: Lind Guest 04/03/2010 16:01:00  _____________________________________________________________________  External Attachment:    Type:   Image     Comment:   External Document

## 2010-07-29 NOTE — Progress Notes (Signed)
Summary: referral  Phone Note Call from Patient   Summary of Call: pt has appt with dr. Everardo All for 12/20/2009. Pt is going and faxed over records to office  Initial call taken by: Rudene Anda,  December 20, 2009 2:25 PM  Follow-up for Phone Call        Noted. Follow-up by: Esperanza Sheets PA,  December 23, 2009 8:13 AM

## 2010-07-29 NOTE — Assessment & Plan Note (Signed)
Summary: F UP room 3   Vital Signs:  Patient profile:   54 year old male Height:      71.5 inches Weight:      453 pounds BMI:     62.53 O2 Sat:      95 % Pulse rate:   95 / minute Resp:     16 per minute BP sitting:   118 / 70  (left arm) Cuff size:   large  Vitals Entered By: Everitt Amber LPN (April 01, 2010 11:00 AM) CC: Follow up chronic problems   Referring Provider:  Esperanza Sheets, PA Primary Provider:  Esperanza Sheets PA  CC:  Follow up chronic problems.  History of Present Illness: Pt presents today for check up.  Has seen Dr Romeo Apple and had EMG.  Dx with severe CTS.  Wearing braces at bedtime.  Has started Neurontin.  Only wants surgery as a last resort.  Still having alot of pain even with current meds.  Hx of DM. States blood sugars have been running better. [id number] this am.  Has not been back to see endocrinologist due to expense.  Continuing meds as prescribed, including insulin. No previous pneumovax.  Needs flu vac also.  Hx of htn. Taking medications as prescribed and denies side effects.  No headache, chest pain or palpitations.    Current Medications (verified): 1)  Niaspan 750 Mg Cr-Tabs (Niacin (Antihyperlipidemic)) .... Two Tabs By Mouth At Bedtime 2)  Metformin Hcl 1000 Mg Tabs (Metformin Hcl) .... One Tab By Mouth Two Times A Day 3)  Androgel Pump 1 % Gel (Testosterone) .... Apply 3 Pumps Once A Day As Directed 4)  Aspirin 81 Mg Tabs (Aspirin) .... One Tab By Mouth Once Daily 5)  Simvastatin 40 Mg Tabs (Simvastatin) .... One Tab By Mouth Once Daily 6)  Lisinopril 20 Mg Tabs (Lisinopril) .... One Tab By Mouth Once Daily 7)  Nabumetone 750 Mg Tabs (Nabumetone) .... One Tab By Mouth Two Times A Day 8)  Lamotrigine 200 Mg Tabs (Lamotrigine) .... One Tab By Mouth Once Daily 9)  Combigan 0.2-0.5 % Soln (Brimonidine Tartrate-Timolol) .... One Drop in Each Eye Two Times A Day 10)  Bd Pen Needle Short U/f 31g X 8 Mm Misc (Insulin Pen Needle) .... Use Two Times  A Day As Directed 11)  Humulin 70/30 70-30 % Susp (Insulin Isophane & Regular) .Marland KitchenMarland KitchenMarland Kitchen 170 Units Two Times A Day, and 1 Cc Syringes Two Times A Day 12)  Citalopram Hydrobromide 40 Mg Tabs (Citalopram Hydrobromide) .Marland Kitchen.. 1 Tablet Once Daily 13)  Tramadol Hcl 50 Mg Tabs (Tramadol Hcl) .... Take 1 Tablet By Mouth Three Times A Day As Needed 14)  Meloxicam 15 Mg Tabs (Meloxicam) .... Take 1 Daily For Arm Pain 15)  Hydrocodone-Acetaminophen 5-325 Mg Tabs (Hydrocodone-Acetaminophen) .... One To Two Tabs By Mouth Every 6-8 Hours As Needed For Severe Pain 16)  Neurontin 100 Mg Caps (Gabapentin) .Marland Kitchen.. 1 By Mouth Three Times A Day 17)  Alphagan P 0.1 % Soln (Brimonidine Tartrate) .... One Drop in Each Eye Two Times A Day  Allergies (verified): No Known Drug Allergies PMH reviewed for relevance  Review of Systems General:  Denies chills and fever. ENT:  Denies ear discharge, nasal congestion, and sore throat. CV:  Denies chest pain or discomfort and palpitations. Resp:  Denies cough and shortness of breath. MS:  Complains of muscle aches. Psych:  Complains of easily angered.  Physical Exam  General:  Well-developed,well-nourished,in no acute distress; alert,appropriate  and cooperative throughout examination Head:  Normocephalic and atraumatic without obvious abnormalities. No apparent alopecia or balding. Ears:  External ear exam shows no significant lesions or deformities.  Otoscopic examination reveals clear canals, tympanic membranes are intact bilaterally without bulging, retraction, inflammation or discharge. Hearing is grossly normal bilaterally. Nose:  External nasal examination shows no deformity or inflammation. Nasal mucosa are pink and moist without lesions or exudates. Mouth:  Oral mucosa and oropharynx without lesions or exudates.  Neck:  No deformities, masses, or tenderness noted. Lungs:  Normal respiratory effort, chest expands symmetrically. Lungs are clear to auscultation, no crackles  or wheezes. Heart:  Normal rate and regular rhythm. S1 and S2 normal without gallop, murmur, click, rub or other extra sounds. Cervical Nodes:  No lymphadenopathy noted Psych:  Cognition and judgment appear intact. Alert and cooperative with normal attention span and concentration. No apparent delusions, illusions, hallucinations   Impression & Recommendations:  Problem # 1:  DIABETES MELLITUS, TYPE II, UNCONTROLLED (ICD-250.02) Assessment Comment Only  His updated medication list for this problem includes:    Metformin Hcl 1000 Mg Tabs (Metformin hcl) ..... One tab by mouth two times a day    Aspirin 81 Mg Tabs (Aspirin) ..... One tab by mouth once daily    Lisinopril 20 Mg Tabs (Lisinopril) ..... One tab by mouth once daily    Humulin 70/30 70-30 % Susp (Insulin isophane & regular) .Marland KitchenMarland KitchenMarland KitchenMarland Kitchen 170 units two times a day, and 1 cc syringes two times a day  Orders: T-CMP with estimated GFR ([id number]) T-Urine Microalbumin w/creat. ratio [phone])  Problem # 2:  HYPERTENSION (ICD-401.9) Assessment: Comment Only  The following medications were removed from the medication list:    Furosemide 40 Mg Tabs (Furosemide) .Marland Kitchen... Take 1 tablet by mouth two times a day His updated medication list for this problem includes:    Lisinopril 20 Mg Tabs (Lisinopril) ..... One tab by mouth once daily  Orders: T-CMP with estimated GFR ([id number])  BP today: 118/70 Prior BP: 100/60 (02/28/2010)  Labs Reviewed: K+: 5.0 (01/13/2010) Creat: : 0.87 (01/13/2010)   Chol: 147 (01/29/2010)   HDL: 36 (01/29/2010)   LDL: 62 (01/29/2010)   TG: 246 (01/29/2010)  Problem # 3:  CARPAL TUNNEL SYNDROME, BILATERAL (ICD-354.0) Assessment: Comment Only  Problem # 4:  HYPOGONADISM (ICD-257.2) Assessment: Comment Only  Orders: T-Testosterone; Total [phone]) T-PSA [phone])  Complete Medication List: 1)  Niaspan 750 Mg Cr-tabs (Niacin (antihyperlipidemic)) .... Two tabs by mouth at bedtime 2)   Metformin Hcl 1000 Mg Tabs (Metformin hcl) .... One tab by mouth two times a day 3)  Androgel Pump 1 % Gel (Testosterone) .... Apply 3 pumps once a day as directed 4)  Aspirin 81 Mg Tabs (Aspirin) .... One tab by mouth once daily 5)  Simvastatin 40 Mg Tabs (Simvastatin) .... One tab by mouth once daily 6)  Lisinopril 20 Mg Tabs (Lisinopril) .... One tab by mouth once daily 7)  Nabumetone 750 Mg Tabs (Nabumetone) .... One tab by mouth two times a day 8)  Lamotrigine 200 Mg Tabs (Lamotrigine) .... One tab by mouth once daily 9)  Combigan 0.2-0.5 % Soln (Brimonidine tartrate-timolol) .... One drop in each eye two times a day 10)  Bd Pen Needle Short U/f 31g X 8 Mm Misc (Insulin pen needle) .... Use two times a day as directed 11)  Humulin 70/30 70-30 % Susp (Insulin isophane & regular) .Marland KitchenMarland Kitchen. 170 units two times a day, and 1 cc syringes two times a day  12)  Citalopram Hydrobromide 40 Mg Tabs (Citalopram hydrobromide) .Marland Kitchen.. 1 tablet once daily 13)  Tramadol Hcl 50 Mg Tabs (Tramadol hcl) .... Take 1 tablet by mouth three times a day as needed 14)  Meloxicam 15 Mg Tabs (Meloxicam) .... Take 1 daily for arm pain 15)  Hydrocodone-acetaminophen 7.5-325 Mg Tabs (Hydrocodone-acetaminophen) .... Take one tablet two times a day for carpal tunnel pain 16)  Neurontin 100 Mg Caps (Gabapentin) .Marland Kitchen.. 1 by mouth three times a day 17)  Alphagan P 0.1 % Soln (Brimonidine tartrate) .... One drop in each eye two times a day  Other Orders: T-Lipid Profile [phone]) T-CBC No Diff ([id number]) T-TSH [phone]) Influenza Vaccine NON MCR (57846) Pneumococcal Vaccine ([address]) Admin 1st Vaccine (28413) Admin 1st Vaccine [facility]) [phone])  Patient Instructions: 1)  Please schedule a follow-up appointment in 3 months. 2)  It is important that you exercise regularly at least 20 minutes 5 times a week. If you develop chest pain, have severe difficulty breathing, or feel very tired , stop exercising immediately and seek  medical attention. 3)  You need to lose weight. Consider a lower calorie diet and regular exercise.  4)  You have received a flu vaccine and Pneumonia shot today. 5)  Continue follow up with Dr Romeo Apple regarding your Carpal Tunnel. 6)  I have increased the strenth of your Hydrocodone pain medication.  Do not take more than 2 a day. Prescriptions: MELOXICAM 15 MG TABS (MELOXICAM) take 1 daily for arm pain  #30 x 1   Entered by:   Everitt Amber LPN   Authorized by:   Esperanza Sheets PA   Signed by:   Everitt Amber LPN on 27/25/3664   Method used:   Electronically to        [facility]* (retail)       [address]       [place], [place]  [address]       Ph: [phone]       Fax: [phone]   RxID:   [id number] TRAMADOL HCL 50 MG TABS (TRAMADOL HCL) Take 1 tablet by mouth three times a day as needed  #90 Each x 0   Entered by:   Everitt Amber LPN   Authorized by:   Esperanza Sheets PA   Signed by:   Everitt Amber LPN on 35/57/3220   Method used:   Electronically to        [facility]* (retail)       [address]       [place], [place]  [address]       Ph: [phone]       Fax: [phone]   RxID:   [id number] METFORMIN HCL 1000 MG TABS (METFORMIN HCL) one tab by mouth two times a day  #60 x 1   Entered by:   Everitt Amber LPN   Authorized by:   Esperanza Sheets PA   Signed by:   Everitt Amber LPN on 00/93/8182   Method used:   Electronically to        [facility]* (retail)       [address]       [place], [place]  [address]       Ph: [phone]       Fax: [phone]   RxID:   [id number] HYDROCODONE-ACETAMINOPHEN 7.5-325 MG TABS (  HYDROCODONE-ACETAMINOPHEN) take one tablet two times a day for carpal tunnel pain  #60 x 1   Entered and Authorized by:   Esperanza Sheets PA   Signed by:   Esperanza Sheets PA on 04/01/2010   Method used:   Printed then faxed to ...       [facility]* (retail)       [address]       [place], [place]  [address]       Ph: [phone]       Fax: [phone]   RxID:   [id number]     Pneumovax    Vaccine Type: Pneumovax    Site: left deltoid    Mfr: Merck    Dose: 0.5 ml    Route: IM    Given by: Mauricia Area    Exp. Date: 09/08/2013    Lot #: [id number]  Influenza Vaccine    Vaccine Type: Fluvax Non-MCR    Site: right deltoid    Mfr: novartis    Dose: 0.5 ml    Route: IM    Given by: Mauricia Area CMA    Exp. Date: 10/2010    Lot #: [id number]

## 2010-07-29 NOTE — Progress Notes (Signed)
  Phone Note Call from Patient   Summary of Call: Can patient have refill on [facility]ie[facility]'s Androgel 1% pump 150gm? Initial call taken by: Everitt Amber LPN,  April 04, 2010 10:11 AM  Follow-up for Phone Call        Yes, he is no longer seeing [facility].  His Androgel directions should now be 3 pumps daily. Follow-up by: Esperanza Sheets PA,  April 04, 2010 10:30 AM  Additional Follow-up for Phone Call Additional follow up Details #1::        faxed in Additional Follow-up by: Everitt Amber LPN,  April 04, 2010 11:09 AM    Prescriptions: ANDROGEL PUMP 1 % GEL (TESTOSTERONE) apply 3 pumps once a day as directed  #39month x 3   Entered by:   Everitt Amber LPN   Authorized by:   Esperanza Sheets PA   Signed by:   Everitt Amber LPN on 16/03/9603   Method used:   Printed then faxed to ...       [facility]* (retail)       [address]       [place], [place]  [address]       Ph: [phone]       Fax: [phone]   RxID:   [id number]

## 2010-07-29 NOTE — Progress Notes (Signed)
Summary: med  Phone Note Call from Patient   Summary of Call: pt would like to get something called in that is stronger. medicine that was called in isn't working. [phone] [facility]  Initial call taken by: Rudene Anda,  March 04, 2010 8:04 AM  Follow-up for Phone Call        Advise pt to continue taking the Meloxicam that was originally prescribed.  This should also help with inflammation in the tissue even though it is not providing pain relief.  He may also ice the areas to help as needed. Rx Hydrocodone 5/325 #30 take one every 8 hrs as needed for severe pain.  No rf. Tell pt Ok to take this with his other meds, including Meloxicam. Follow-up by: Esperanza Sheets PA,  March 04, 2010 8:52 AM  Additional Follow-up for Phone Call Additional follow up Details #1::        rx sent and patient aware Additional Follow-up by: Adella Hare LPN,  March 04, 2010 9:14 AM    New/Updated Medications: HYDROCODONE-ACETAMINOPHEN 5-325 MG TABS (HYDROCODONE-ACETAMINOPHEN) one tab by mouth every 8 hours as needed for severe pain Prescriptions: HYDROCODONE-ACETAMINOPHEN 5-325 MG TABS (HYDROCODONE-ACETAMINOPHEN) one tab by mouth every 8 hours as needed for severe pain  #30 x 0   Entered by:   Adella Hare LPN   Authorized by:   Esperanza Sheets PA   Signed by:   Adella Hare LPN on 11/91/4782   Method used:   Printed then faxed to ...       [facility]* (retail)       [address]       [place], [place]  [address]       Ph: [phone]       Fax: [phone]   RxID:   [id number] HYDROCODONE-ACETAMINOPHEN 5-325 MG TABS (HYDROCODONE-ACETAMINOPHEN) one tab by mouth every 8 hours as needed for severe pain  #30 x 0   Entered by:   Adella Hare LPN   Authorized by:   Syliva Overman MD   Signed by:   Adella Hare LPN on 47/42/5956   Method used:   Printed then faxed to ...       [facility]* (retail)       [address]  [place], [place]  [address]       Ph: [phone]       Fax: [phone]   RxID:   [id number]  will reprint with dawns name

## 2010-07-29 NOTE — Progress Notes (Signed)
Summary: MEDICINE  Phone Note Call from Patient   Summary of Call: THE PAIN MEDICINE IS NOT WORKING CAN YOU GIVE HIM A LITTLE SOMETHING STRONGER SEND  IN TO [facility] CALL BACK AND IF HE DOES NOT ANSWER LEAVE A MESSAGE Initial call taken by: Lind Guest,  March 18, 2010 11:33 AM  Follow-up for Phone Call        He can take 2 hydrocodone every 6-8 hrs as needed. When is his appt with ortho? Follow-up by: Esperanza Sheets PA,  March 18, 2010 11:52 AM  Additional Follow-up for Phone Call Additional follow up Details #1::        returned call, left message Additional Follow-up by: Adella Hare LPN,  March 18, 2010 2:02 PM    New/Updated Medications: HYDROCODONE-ACETAMINOPHEN 5-325 MG TABS (HYDROCODONE-ACETAMINOPHEN) one to two tabs by mouth every 6-8 hours as needed for severe pain Prescriptions: HYDROCODONE-ACETAMINOPHEN 5-325 MG TABS (HYDROCODONE-ACETAMINOPHEN) one to two tabs by mouth every 6-8 hours as needed for severe pain  #60 x 0   Entered by:   Adella Hare LPN   Authorized by:   Esperanza Sheets PA   Signed by:   Adella Hare LPN on 16/03/9603   Method used:   Printed then faxed to ...       [facility]* (retail)       [address]       [place], [place]  [address]       Ph: [phone]       Fax: [phone]   RxID:   [id number]

## 2010-07-29 NOTE — Assessment & Plan Note (Signed)
Summary: BILAT ARM PAIN,?TENDONITIS/REF M.SIMPSON/CAF   Visit Type:  new patient Referring Provider:  Esperanza Sheets, PA Primary Provider:  Esperanza Sheets PA  CC:  arm pain.  History of Present Illness: I saw Nicholas [name] in the office today for an initial visit.  He is a 55 years old man with the complaint of:  arms and hands.  Xrays today.  Meds: EMR, Norco 5, Tramadol, Mobic, Relafen and ASA for pain.   Our patient today presents with sharp throbbing stabbing burning severe pain in both hands which has become constant associated with numbness and tingling of his fingertips with some swelling.  Most of the pain is from his wrist down to the fingers with numbness and burning although he gets some forearm pain at times.  He is diabetic but his diabetes has been well-controlled for the last 4 months.  No other treatment listed other than some elastic wrist braces which she wears at night    Allergies (verified): No Known Drug Allergies  Family History: mother deceased- DM, Leukemia father deceased- Heart dz, colon cancer, brain hemmorage, parkinsons, altzheimers, glacoma 5 brothers- health status unknown 5 sisters- health status unknown FH of Cancer:  Family History of Diabetes Family History Coronary Heart Disease male < 54  Social History: Disabled  since 2004 or 2005 Separated no dependents Never Smoked Alcohol use-no nho caffeine use Drug use-no Regular exercise-yes- [employer] 12th grade ed  Review of Systems Constitutional:  Complains of weight gain; denies weight loss, fever, chills, and fatigue. Cardiovascular:  Denies chest pain, palpitations, fainting, and murmurs. Respiratory:  Denies short of breath, wheezing, couch, tightness, pain on inspiration, and snoring . Gastrointestinal:  Denies heartburn, nausea, vomiting, diarrhea, constipation, and blood in your stools. Genitourinary:  Denies frequency, urgency, difficulty urinating, painful urination, flank pain,  and bleeding in urine. Neurologic:  Denies numbness, tingling, unsteady gait, dizziness, tremors, and seizure. Musculoskeletal:  Denies joint pain, swelling, instability, stiffness, redness, heat, and muscle pain. Endocrine:  Denies excessive thirst, exessive urination, and heat or cold intolerance. Psychiatric:  Denies nervousness, depression, anxiety, and hallucinations. Skin:  Denies changes in the skin, poor healing, rash, itching, and redness. HEENT:  Denies blurred or double vision, eye pain, redness, and watering. Immunology:  Denies seasonal allergies, sinus problems, and allergic to bee stings. Hemoatologic:  Denies easy bleeding and brusing.  Physical Exam  Additional Exam:  vital signs are as recorded  Patient is large well-developed well-nourished woman hygiene acceptable  Normal perfusion in his hands no edema  No lymphadenopathy in the upper extremities  Skin upper extremities normal  Sensory changes in the hands bilaterally mostly in the fingertips in terms of soft touch.  Position sense normal  Coordination normal  He's awake alert oriented x3 mood and affect are normal  Date is noncontributory  Both hands appear to be normal on inspection he has some tenderness at the fingertips bilaterally no tenderness in the wrist itself some mild tenderness in the hand.  Has normal range of motion equal strength  Both wrist joints are stable.  No tenderness over the carpal tunnel.     Impression & Recommendations:  Problem # 1:  CARPAL TUNNEL SYNDROME, BILATERAL (ICD-354.0) Assessment New  most likely carpal tunnel syndrome  Recommended initial treatment with Neurontin 100 mg 3 times a day, bilateral night splints, vitamin B6 100 mg twice a day, also would like get nerve conduction study to confirm  Followup 6 weeks  Orders: Neurology Referral (Neuro) New Patient Level III (  [address])  Medications Added to Medication List This Visit: 1)  Neurontin 100 Mg Caps  (Gabapentin) .Marland Kitchen.. 1 by mouth three times a day  Patient Instructions: 1)  NCS uppers 2)  Neurontin three times a day 3)  Over the counter vitamin B6 100mg  twice a day 4)  Braces at night 5)  come back in 6 weeks Prescriptions: NEURONTIN 100 MG CAPS (GABAPENTIN) 1 by mouth three times a day  #90 x 2   Entered and Authorized by:   Fuller Canada MD   Signed by:   Fuller Canada MD on 03/24/2010   Method used:   Faxed to ...       [facility]* (retail)       [address]       [place], [place]  [address]       Ph: [phone]       Fax: [phone]   RxID:   [id number]

## 2010-07-29 NOTE — Progress Notes (Signed)
  Phone Note Call from Patient   Summary of Call: patient states his pain meds tramadol and hydrocodone not working, he is still having pain in hands and knees states he takes as prescribed but it is not controlling his pain, can you make any changes for him?  please see me for any questions re this, i know this patient Initial call taken by: Adella Hare LPN,  May 14, 2010 11:21 AM  Follow-up for Phone Call        NEEDS TO SEE PAIN SPECIAL[facility] T FO PAIN MANAGEMENT, PLS LERTME KNOW WHO HE WANTS TO SEE, I DO NOT PLAN [facility]N BEING RESPONSIBLE FOR HIS PAIN MANAGEMENT Follow-up by: Syliva Overman MD,  May 15, 2010 12:29 PM  Additional Follow-up for Phone Call Additional follow up Details #1::        patient requests to be referred to someone in [place] for pain management, but wants to wait until the first of the year for financial reasons  also he would like to know if you can give him somthing stronger until he can get into pain management the first of the year Additional Follow-up by: Adella Hare LPN,  May 15, 2010 2:01 PM    Additional Follow-up for Phone Call Additional follow up Details #2::    pls let pt know dose inc not possible without iov will refill up to 2 months, he should be coming in  by january, at that time any cahnges will be adressed, pls give 2 refills Follow-up by: Syliva Overman MD,  May 15, 2010 4:57 PM  Additional Follow-up for Phone Call Additional follow up Details #3:: Details for Additional Follow-up Action Taken: rx sent, patient aware Additional Follow-up by: Adella Hare LPN,  May 16, 2010 2:20 PM  Prescriptions: HYDROCODONE-ACETAMINOPHEN 7.5-325 MG TABS (HYDROCODONE-ACETAMINOPHEN) take one tablet two times a day for carpal tunnel pain  #60 x 1   Entered by:   Adella Hare LPN   Authorized by:   Syliva Overman MD   Signed by:   Adella Hare LPN on 35/00/9381   Method used:   Printed then faxed to ...       [facility]* (retail)       [address]       [place], [place]  [address]       Ph: [phone]       Fax: [phone]   RxID:   [id number] TRAMADOL HCL 50 MG TABS (TRAMADOL HCL) Take 1 tablet by mouth three times a day as needed  #90 Each x 1   Entered by:   Adella Hare LPN   Authorized by:   Syliva Overman MD   Signed by:   Adella Hare LPN on 15/40/0867   Method used:   Printed then faxed to ...       [facility]* (retail)       [address]       [place], [place]  [address]       Ph: [phone]       Fax: [phone]   RxID:   [id number]

## 2010-07-29 NOTE — Assessment & Plan Note (Signed)
Summary: sugars- room1   Vital Signs:  Patient profile:   55 year old male Height:      71.75 inches Weight:      415.25 pounds BMI:     56.92 O2 Sat:      93 % on Room air Pulse rate:   79 / minute Resp:     16 per minute BP sitting:   100 / 60  (left arm)  Vitals Entered By: Adella Hare LPN (December 20, 2009 9:03 AM) CC: blood sugars high Is Patient Diabetic? Yes Did you bring your meter with you today? Yes Pain Assessment Patient in pain? no      Comments did not bring meds to ov   CC:  blood sugars high.  History of Present Illness: Pt presents today with hyperglycemia.   He was at ER yesterday.  Blood sugar 210 when arrived home from ER yesterday after receiving regular insulin.  Then  589 at 4:50 this am , then 453 at 7:05 am. Pt aware that his diet is poor.  He states that he cant financially afford to eat the proper things.  He eats spagetti most of the time.  He threw out a big pot of spaghetti this morning though after he saw how high his blood sugars were again after eating it last night.  He is urinating freq and thirsty all of the time.  He is tired and has no energy.   We scheduled an appt with endocrinology for 01-06-10. Pt states he had called and rescheduled til later in  July because he did not have the money to go.  But states his sister has since told him that she will loan him the money so now he says he can go sooner.    Allergies (verified): No Known Drug Allergies  Past History:  Past medical history reviewed for relevance to current acute and chronic problems.  Past Medical History: Reviewed history from 12/12/2009 and no changes required. Diabetes mellitus, type II Hyperlipidemia Testosterone deficency Hypertension Poor leg circulation MRSA Chronic knee pain Depression Glaucoma  Review of Systems General:  Complains of fatigue; denies chills, fever, and loss of appetite. CV:  Denies chest pain or discomfort and palpitations. Resp:   Denies shortness of breath. GI:  Denies abdominal pain, nausea, and vomiting. GU:  Complains of nocturia and urinary frequency. Endo:  Complains of excessive thirst and excessive urination.  Physical Exam  General:  alert, normal appearance, cooperative to examination, good hygiene, and overweight-appearing.   Head:  Normocephalic and atraumatic without obvious abnormalities. No apparent alopecia or balding. Ears:  External ear exam shows no significant lesions or deformities.  Otoscopic examination reveals clear canals, tympanic membranes are intact bilaterally without bulging, retraction, inflammation or discharge. Hearing is grossly normal bilaterally. Nose:  External nasal examination shows no deformity or inflammation. Nasal mucosa are pink and moist without lesions or exudates. Mouth:  Oral mucosa and oropharynx without lesions or exudates.   Neck:  No deformities, masses, or tenderness noted. Lungs:  Normal respiratory effort, chest expands symmetrically. Lungs are clear to auscultation, no crackles or wheezes. Heart:  Normal rate and regular rhythm. S1 and S2 normal without gallop, murmur, click, rub or other extra sounds. Cervical Nodes:  No lymphadenopathy noted Psych:  Cognition and judgment appear intact. Alert and cooperative with normal attention span and concentration. No apparent delusions, illusions, hallucinations   Impression & Recommendations:  Problem # 1:  DIABETES MELLITUS, TYPE II, UNCONTROLLED (ICD-250.02) Assessment Unchanged  His updated medication list for this problem includes:    Metformin Hcl 1000 Mg Tabs (Metformin hcl) ..... One tab by mouth two times a day    Levemir Flexpen 100 Unit/ml Soln (Insulin detemir) ..... Inject 62 units subcutaneously at bedtime as directed    Aspirin 81 Mg Tabs (Aspirin) ..... One tab by mouth once daily    Lisinopril 20 Mg Tabs (Lisinopril) ..... One tab by mouth once daily    Humulin 70/30 Pen 70-30 % Susp (Insulin isophane  & regular) ..... Use 7 units two times a day as directed    Novolog Flexpen 100 Unit/ml Soln (Insulin aspart) ..... Use before breakfast and lunch according to sliding scale  Orders: Insulin 5 units (X9147) Admin of Therapeutic Inj  intramuscular or subcutaneous (82956)  Complete Medication List: 1)  Niaspan 750 Mg Cr-tabs (Niacin (antihyperlipidemic)) .... Two tabs by mouth at bedtime 2)  Metformin Hcl 1000 Mg Tabs (Metformin hcl) .... One tab by mouth two times a day 3)  Androgel Pump 1 % Gel (Testosterone) .... Apply four pumps once a day as directed 4)  Levemir Flexpen 100 Unit/ml Soln (Insulin detemir) .... Inject 62 units subcutaneously at bedtime as directed 5)  Aspirin 81 Mg Tabs (Aspirin) .... One tab by mouth once daily 6)  Simvastatin 40 Mg Tabs (Simvastatin) .... One tab by mouth once daily 7)  Lisinopril 20 Mg Tabs (Lisinopril) .... One tab by mouth once daily 8)  Nabumetone 750 Mg Tabs (Nabumetone) .... One tab by mouth two times a day 9)  Lamotrigine 200 Mg Tabs (Lamotrigine) .... One tab by mouth once daily 10)  Combigan 0.2-0.5 % Soln (Brimonidine tartrate-timolol) .... One drop in each eye two times a day 11)  Humulin 70/30 Pen 70-30 % Susp (Insulin isophane & regular) .... Use 7 units two times a day as directed 12)  Bd Pen Needle Short U/f 31g X 8 Mm Misc (Insulin pen needle) .... Use two times a day as directed 13)  Novolog Flexpen 100 Unit/ml Soln (Insulin aspart) .... Use before breakfast and lunch according to sliding scale  Other Orders: Glucose, (CBG) (21308)  Patient Instructions: 1)  Keep your appt next week. 2)  We are going to try to get you into the Diabetes Dr sooner. 3)  Increase your Humulin 70/30 insulin to 10 units before breakfast and lunch. 4)  I have prescribed Novolog (regular insulin).  You will use this before breakfast and lunch according to sliding scale.  You must eat after using this insulin. 5)  I have given you a glycemic index diet to  help with your food choices. 6)  Sliding Scale: 7)  If your sugar is:                          Dose of regular (Novalog) insulin 8)  201-250                                             3 units 9)  251- 300                                            5 units 10)  301-350  7 units 11)  351-400                                             9 units 12)  401-450                                            11 units 13)  451-500                                             13 units 14)  501-550                                             15 units 15)  > 550                                                 Go To ER Prescriptions: NOVOLOG FLEXPEN 100 UNIT/ML SOLN (INSULIN ASPART) use before breakfast and lunch according to sliding scale  #3 pens x 1   Entered and Authorized by:   Esperanza Sheets PA   Signed by:   Esperanza Sheets PA on 12/20/2009   Method used:   Electronically to        [facility]* (retail)       [address] [place], [place]  [address]       Ph: [phone]       Fax: [phone]   RxID:   [id number]   Laboratory Results   Blood Tests   Date/Time Received: December 20, 2009 9:04 AM  Date/Time Reported: December 20, 2009 9:04 AM    Comments: sugar exceeds 444- to high for machine to read      Medication Administration  Injection # 1:    Medication: Insulin 5 units    Diagnosis: DIABETES MELLITUS, TYPE II, UNCONTROLLED (ICD-250.02)    Route: SQ    Site: L deltoid    Exp Date: 5/13    Lot #: [id number]    Mfr: novo nordisk    Comments: 11units given    Patient tolerated injection without complications    Given by: Adella Hare LPN (December 23, 2009 3:30 PM)  Orders Added: 1)  Glucose, (CBG) [82962] 2)  Est. Patient Level IV [01027] 3)  Insulin 5 units [J1815] 4)  Admin of Therapeutic Inj  intramuscular or subcutaneous [25366]

## 2010-07-29 NOTE — Progress Notes (Signed)
Summary: nurse  Phone Note Call from Patient   Summary of Call: pt would like to speak with the nurse [phone] Initial call taken by: Rudene Anda,  February 14, 2010 11:28 AM  Follow-up for Phone Call        returned call, no answer Follow-up by: Adella Hare LPN,  February 14, 2010 1:31 PM  Additional Follow-up for Phone Call Additional follow up Details #1::        states he has tingling in wrists and fingers, braces help  thinks he may have carpel tunnel Additional Follow-up by: Adella Hare LPN,  February 14, 2010 1:35 PM    Additional Follow-up for Phone Call Additional follow up Details #2::    He has an appt 03-07-10.  I can check this then, unless he thinks he needs to come in sooner. Since the braces are helping he can continue to wear them. Follow-up by: Esperanza Sheets PA,  February 17, 2010 8:18 AM  Additional Follow-up for Phone Call Additional follow up Details #3:: Details for Additional Follow-up Action Taken: called patient, no answer Additional Follow-up by: Adella Hare LPN,  February 17, 2010 11:45 AM  patient aware Adella Hare LPN  February 17, 2010 3:07 PM

## 2010-07-29 NOTE — Progress Notes (Signed)
Summary: CALL  Phone Note Call from Patient   Summary of Call: CALL BACK AT [phone] Initial call taken by: Lind Guest,  December 19, 2009 8:06 AM  Follow-up for Phone Call        Blood sugars still running high.  No change in blood sugars with addition of 70/30 insulin.  Using 3 units am & lunch.  Still using same dose of Lantus at bedtime.  Fasting blood sugars 500+ and night times upper 300's to 400.  Advised pt to increase his 70/30 to 7 units two times a day, and to give himself an additional 4 units this am since he had 3 units approx 45 min ago.  Follow-up by: Esperanza Sheets PA,  December 19, 2009 8:12 AM     Appended Document: CALL Pt has endocrinology appt 01-06-10 with Dr Everardo All

## 2010-07-29 NOTE — Progress Notes (Signed)
Summary: [facility] referral  Phone Note Call from Patient   Summary of Call: [facility] in [place] stated they thought  it would be better for him to stay at hospital and be eval.  pam [phone] would like to know what doc thinks about this. Also called [facility] across [address] and they stated they had nothing and for me to call [facility] they got pts in for that.   Initial call taken by: Rudene Anda,  January 13, 2010 4:26 PM  Follow-up for Phone Call        pls call pt and see how he is doing, let hiom know labs were good an no clot in his legs. i f he continues to have alot of shortness of breath and swelling he needs to go directly to the Ed for further evaluation, the first available card visit is not before August and that is their recommendation, ensure he understands pls Follow-up by: Syliva Overman MD,  January 14, 2010 9:22 AM  Additional Follow-up for Phone Call Additional follow up Details #1::        Patient aware Additional Follow-up by: Everitt Amber LPN,  January 14, 2010 9:31 AM     Appended Document: [facility] referral Patient has appointment Wed, Aug 10th at 3:20pm.

## 2010-07-29 NOTE — Assessment & Plan Note (Signed)
Summary: NEW ENDO DIABETES II MEDICARE PT-PER SALLY/DR SIMPSON/PC PHY-...   Vital Signs:  Patient profile:   55 year old male Height:      71.75 inches (182.25 cm) Weight:      415.25 pounds (188.75 kg) BMI:     56.92 O2 Sat:      94 % on Room air Temp:     98.0 degrees F (36.67 degrees C) oral Pulse rate:   88 / minute BP sitting:   118 / 74  (left arm) Cuff size:   large  Vitals Entered By: Brenton Grills MA (December 20, 2009 3:23 PM)  O2 Flow:  Room air CC: new endo pt/diabetes/aj Is Patient Diabetic? Yes Did you bring your meter with you today? Yes   Referring Provider:  sampson  CC:  new endo pt/diabetes/aj.  History of Present Illness: pt states 15 years h/o dm.  he unaware of any chronic complications.  he has been on insulin x 3 years.  he takes humulin 70/30, 10 units bid and levemir 60 units qhs.  today, he started novolog for as needed use.  he brings a record of his cbg's which i have reviewed today.  it varies from 200-600, with no trend throughout the day.     pt says his diet is "fair," and exercise is "good."   symptomatically, pt states few weeks of severe dryness of the mouth, but no associated pain.   he says the cost of a medication is a primary concern.  he says "i want to take a generic insulin if all possible."   Current Medications (verified): 1)  Niaspan 750 Mg Cr-Tabs (Niacin (Antihyperlipidemic)) .... Two Tabs By Mouth At Bedtime 2)  Metformin Hcl 1000 Mg Tabs (Metformin Hcl) .... One Tab By Mouth Two Times A Day 3)  Androgel Pump 1 % Gel (Testosterone) .... Apply Four Pumps Once A Day As Directed 4)  Levemir Flexpen 100 Unit/ml Soln (Insulin Detemir) .... Inject 62 Units Subcutaneously At Bedtime As Directed 5)  Aspirin 81 Mg Tabs (Aspirin) .... One Tab By Mouth Once Daily 6)  Simvastatin 40 Mg Tabs (Simvastatin) .... One Tab By Mouth Once Daily 7)  Lisinopril 20 Mg Tabs (Lisinopril) .... One Tab By Mouth Once Daily 8)  Nabumetone 750 Mg Tabs  (Nabumetone) .... One Tab By Mouth Two Times A Day 9)  Lamotrigine 200 Mg Tabs (Lamotrigine) .... One Tab By Mouth Once Daily 10)  Combigan 0.2-0.5 % Soln (Brimonidine Tartrate-Timolol) .... One Drop in Each Eye Two Times A Day 11)  Humulin 70/30 Pen 70-30 % Susp (Insulin Isophane & Regular) .... Use 7 Units Two Times A Day As Directed 12)  Bd Pen Needle Short U/f 31g X 8 Mm Misc (Insulin Pen Needle) .... Use Two Times A Day As Directed 13)  Novolog Flexpen 100 Unit/ml Soln (Insulin Aspart) .... Use Before Breakfast and Lunch According To Sliding Scale  Allergies (verified): No Known Drug Allergies  Past History:  Past Medical History: Last updated: 12/12/2009 Diabetes mellitus, type II Hyperlipidemia Testosterone deficency Hypertension Poor leg circulation MRSA Chronic knee pain Depression Glaucoma  Family History: Reviewed history from 12/12/2009 and no changes required. mother deceased- DM, Leukemia father deceased- Heart dz, colon cancer, brain hemmorage, parkinsons, altzheimers, glacoma 5 brothers- health status unknown 5 sisters- health status unknown  Social History: Reviewed history from 12/12/2009 and no changes required. Disabled  since 2004 or 2005 Separated no dependents Never Smoked Alcohol use-no Drug use-no Regular exercise-yes- [employer]  Review of Systems       The patient complains of depression.         denies blurry vision, headache, chest pain, sob, n/v, urinary frequency, cramps, excessive diaphoresis, memory loss, hypoglycemia, rhinorrhea, and easy bruising.  he has lost weight, due to his renewed diet and exercise efforts.   Physical Exam  General:  obese.  .ns  Head:  head: no deformity eyes: no periorbital swelling, no proptosis external nose and ears are normal mouth: no lesion seen.  mucous membrands are moist Neck:  Supple without thyroid enlargement or tenderness.  Lungs:  Clear to auscultation bilaterally. Normal respiratory effort.    Heart:  Regular rate and rhythm without murmurs or gallops noted. Normal S1,S2.   Msk:  muscle bulk and strength are grossly normal.  no obvious joint swelling.  gait is normal and steady  Pulses:  dorsalis pedis intact bilat, but decreased from normal.  no carotid bruit  Extremities:  no deformity.  no ulcer on the feet.  feet are of normal color and temp, but there are hyperpigmented patches at the legs.  no edema.  there are bilteral varicosities.  mycotic toenails.   Neurologic:  cn 2-12 grossly intact.   readily moves all 4's.   sensation is intact to touch on the feet, but decreased from normal. Skin:  normal texture and temp.  no rash.  not diaphoretic  Cervical Nodes:  No significant adenopathy.  Psych:  Alert and cooperative; normal mood and affect; normal attention span and concentration.     Impression & Recommendations:  Problem # 1:  DIABETES MELLITUS, TYPE II, UNCONTROLLED (ICD-250.02) needs increased rx  Problem # 2:  INADEQUATE MATERIAL RESOURCES (ICD-V60.2) this limits rx  of #1  Problem # 3:  weight loss possibly due to #1  Medications Added to Medication List This Visit: 1)  Humulin 70/30 70-30 % Susp (Insulin isophane & regular) .... 50 units two times a day, and syringes two times a day  Other Orders: Consultation Level IV (19147)  Patient Instructions: 1)  good diet and exercise habits significanly improve the control of your diabetes.  please let me know if you wish to be referred to a dietician.  high blood sugar is very risky to your health.  you should see an eye doctor every year. 2)  controlling your blood pressure and cholesterol drastically reduces the damage diabetes does to your body.  this also applies to quitting smoking.  please discuss these with your doctor.  you should take an aspirin every day, unless you have been advised by a doctor not to. 3)  we will need to take this complex situation in stages 4)  check your blood sugar 2 times a day.   vary the time of day when you check, between before the 3 meals, and at bedtime.  also check if you have symptoms of your blood sugar being too high or too low.  please keep a record of the readings and bring it to your next appointment here.  please call us sooner if you are having low blood sugar episodes. 5)  for now, stop levemir and novolog. 6)  increase humulin 70/30 to 50 units two times a day.  then increase to 60 units two times a day, and then 70 units two times a day, if necessary until blood sugar decreases to the low-100's at some time of day. 7)  Please schedule a follow-up appointment in 2 weeks. Prescriptions: HUMULIN 70/30 70-30 %  SUSP (INSULIN ISOPHANE & REGULAR) 50 units two times a day, and syringes two times a day  #4 vials x 11   Entered and Authorized by:   Minus Breeding MD   Signed by:   Minus Breeding MD on 12/20/2009   Method used:   Electronically to        [facility] (retail)       [address]       [place], [place]  [address]       Ph: [phone]       Fax: [phone]   RxID:   [id number]

## 2010-07-29 NOTE — Letter (Signed)
Summary: PEDOMETER STEPS PER DAY  PEDOMETER STEPS PER DAY   Imported By: Lind Guest 12/13/2009 08:48:24  _____________________________________________________________________  External Attachment:    Type:   Image     Comment:   External Document

## 2010-07-29 NOTE — Letter (Signed)
Summary: medical history  medical history   Imported By: Lind Guest 12/12/2009 11:17:35  _____________________________________________________________________  External Attachment:    Type:   Image     Comment:   External Document

## 2010-07-29 NOTE — Progress Notes (Signed)
Summary: CALL BACK  Phone Note Call from Patient   Summary of Call: NEEDS TO SEE DAWN CALL HIM BACK AT [phone]  LEFT MESSAGE Initial call taken by: Lind Guest,  January 13, 2010 8:15 AM  Follow-up for Phone Call        patient complain of swelling in legs, states feels like he is swollen everywhere, states feels like its been going on a week  states he cant afford er or urgent care Follow-up by: Adella Hare LPN,  January 13, 2010 8:23 AM  Additional Follow-up for Phone Call Additional follow up Details #1::        advised appt today Additional Follow-up by: Adella Hare LPN,  January 13, 2010 10:05 AM

## 2010-07-29 NOTE — Assessment & Plan Note (Signed)
Summary: swelling   Vital Signs:  Patient profile:   55 year old male Height:      71.5 inches Weight:      444.75 pounds BMI:     61.39 O2 Sat:      96 % Pulse rate:   80 / minute Pulse rhythm:   regular Resp:     16 per minute BP sitting:   120 / 68 Cuff size:   large  Vitals Entered By: Everitt Amber LPN (January 13, 2010 2:33 PM)  Nutrition Counseling: Patient's BMI is greater than 25 and therefore counseled on weight management options. CC: Started swelling about 2 weeks ago, Has gained 30lbs in a couple weeks, and has been breathing harder and hurting all over   CC:  Started swelling about 2 weeks ago, Has gained 30lbs in a couple weeks, and and has been breathing harder and hurting all over.  History of Present Illness: Pt call sin with a 2 week h/o generalised swelling and increased pain all over but espescially in his legs. He is an uncontrolled diabetic whio is now being treeated by an endocrinologist, he states his sugars have improved significantly, he tests diligently at least trwice daily and has his record. Thesugars are still primarily over 250, but an improvement. he has no hypoglycemic episode. HJe swims regularly for exercise. He reports orthopne, has to sleep in a recliner, also pND and exertional fatingue, he denies chest pain, but has little exercise toleranc AND MULTIPLE RISK FACTORS FOR coronary artery disease. HE HAS HAD NO CARDIOLOGY EVAL   Current Medications (verified): 1)  Niaspan 750 Mg Cr-Tabs (Niacin (Antihyperlipidemic)) .... Two Tabs By Mouth At Bedtime 2)  Metformin Hcl 1000 Mg Tabs (Metformin Hcl) .... One Tab By Mouth Two Times A Day 3)  Androgel Pump 1 % Gel (Testosterone) .... Apply Four Pumps Once A Day As Directed 4)  Aspirin 81 Mg Tabs (Aspirin) .... One Tab By Mouth Once Daily 5)  Simvastatin 40 Mg Tabs (Simvastatin) .... One Tab By Mouth Once Daily 6)  Lisinopril 20 Mg Tabs (Lisinopril) .... One Tab By Mouth Once Daily 7)  Nabumetone 750 Mg  Tabs (Nabumetone) .... One Tab By Mouth Two Times A Day 8)  Lamotrigine 200 Mg Tabs (Lamotrigine) .... One Tab By Mouth Once Daily 9)  Combigan 0.2-0.5 % Soln (Brimonidine Tartrate-Timolol) .... One Drop in Each Eye Two Times A Day 10)  Bd Pen Needle Short U/f 31g X 8 Mm Misc (Insulin Pen Needle) .... Use Two Times A Day As Directed 11)  Humulin 70/30 70-30 % Susp (Insulin Isophane & Regular) .Marland KitchenMarland KitchenMarland Kitchen 170 Units Two Times A Day, and 1 Cc Syringes Two Times A Day 12)  Citalopram Hydrobromide 40 Mg Tabs (Citalopram Hydrobromide) .Marland Kitchen.. 1 Tablet Once Daily  Allergies (verified): No Known Drug Allergies  Review of Systems General:  Denies chills and fatigue. Eyes:  Denies discharge and red eye. ENT:  Denies hoarseness, nasal congestion, sinus pressure, and sore throat. CV:  Complains of difficulty breathing at night, difficulty breathing while lying down, fatigue, shortness of breath with exertion, swelling of feet, and weight gain; denies chest pain or discomfort and palpitations; 40 pound weight gain in 2 weeks. Resp:  Complains of shortness of breath; denies cough and sputum productive. GI:  Denies abdominal pain, constipation, diarrhea, nausea, and vomiting. GU:  Denies urinary frequency and urinary hesitancy. MS:  pain and swelling behind both calves, left grtr than right, bilateral knee pain left greater than right.  Endo:  Denies excessive hunger, excessive thirst, and excessive urination; blood sugars greatly improved sinceseeing endo. Heme:  Denies bleeding. Allergy:  Denies hives or rash and itching eyes.  Physical Exam  General:  Pleasant morbidly obese male in discomfort , but no obvious C/p distress. Mucosa moist, neck adequate ROM , no adenopathy or JVD.  Head:  Normocephalic and atraumatic without obvious abnormalities. No apparent alopecia or balding. Eyes:  vision grossly intact.   Ears:  R ear normal and L ear normal.   Nose:  no external deformity.   Lungs:  Normal respiratory  effort, chest expands symmetrically. Lungs are clear to auscultation, no crackles or wheezes. Heart:  Normal rate and regular rhythm. S1 and S2 normal without gallop, murmur, click, rub or other extra sounds. Abdomen:  obese non tender , positive bowel sounds Extremities:  decreased ROM spine hips and knees. Bilateral calf tenderness, 2 plus pitting edema bilateraly Neurologic:  alert & oriented X3 and cranial nerves II-XII intact.   Cervical Nodes:  No lymphadenopathy noted Psych:  Oriented X3, memory intact for recent and remote, normally interactive, and good eye contact.     Impression & Recommendations:  Problem # 1:  CALF PAIN, BILATERAL (ICD-729.5) Assessment Comment Only  Orders: Radiology Referral (Radiology),  Problem # 2:  CHF (ICD-428.0) Assessment: Deteriorated  His updated medication list for this problem includes:    Aspirin 81 Mg Tabs (Aspirin) ..... One tab by mouth once daily    Lisinopril 20 Mg Tabs (Lisinopril) ..... One tab by mouth once daily    Furosemide 40 Mg Tabs (Furosemide) .Marland Kitchen... Take 1 tablet by mouth two times a day  Orders: T-BNP  (B Natriuretic Peptide) ([address]) Furosemide- Lasix Injection (J1940) Admin of Therapeutic Inj  intramuscular or subcutaneous ([address])Future Orders: Cardiology Referral (Cardiology) ... 01/15/2010, needed and pasrt due  Problem # 3:  DIABETES MELLITUS, TYPE II, UNCONTROLLED (ICD-250.02) Assessment: Comment Only  His updated medication list for this problem includes:    Metformin Hcl 1000 Mg Tabs (Metformin hcl) ..... One tab by mouth two times a day    Aspirin 81 Mg Tabs (Aspirin) ..... One tab by mouth once daily    Lisinopril 20 Mg Tabs (Lisinopril) ..... One tab by mouth once daily    Humulin 70/30 70-30 % Susp (Insulin isophane & regular) .Marland KitchenMarland KitchenMarland KitchenMarland Kitchen 170 units two times a day, and 1 cc syringes two times a day reports improvement  Problem # 4:  DYSPNEA (ICD-786.05) Assessment: Deteriorated  Orders: CXR- 2view  (CXR)Future Orders: Cardiology Referral (Cardiology) ... 01/15/2010  Problem # 5:  HYPERLIPIDEMIA (ICD-272.4) Assessment: Comment Only  His updated medication list for this problem includes:    Niaspan 750 Mg Cr-tabs (Niacin (antihyperlipidemic)) .Marland Kitchen..Marland Kitchen Two tabs by mouth at bedtime    Simvastatin 40 Mg Tabs (Simvastatin) ..... One tab by mouth once daily  Complete Medication List: 1)  Niaspan 750 Mg Cr-tabs (Niacin (antihyperlipidemic)) .... Two tabs by mouth at bedtime 2)  Metformin Hcl 1000 Mg Tabs (Metformin hcl) .... One tab by mouth two times a day 3)  Androgel Pump 1 % Gel (Testosterone) .... Apply four pumps once a day as directed 4)  Aspirin 81 Mg Tabs (Aspirin) .... One tab by mouth once daily 5)  Simvastatin 40 Mg Tabs (Simvastatin) .... One tab by mouth once daily 6)  Lisinopril 20 Mg Tabs (Lisinopril) .... One tab by mouth once daily 7)  Nabumetone 750 Mg Tabs (Nabumetone) .... One tab by mouth two times a day 8)  Lamotrigine 200 Mg Tabs (Lamotrigine) .... One tab by mouth once daily 9)  Combigan 0.2-0.5 % Soln (Brimonidine tartrate-timolol) .... One drop in each eye two times a day 10)  Bd Pen Needle Short U/f 31g X 8 Mm Misc (Insulin pen needle) .... Use two times a day as directed 11)  Humulin 70/30 70-30 % Susp (Insulin isophane & regular) .Marland KitchenMarland Kitchen. 170 units two times a day, and 1 cc syringes two times a day 12)  Citalopram Hydrobromide 40 Mg Tabs (Citalopram hydrobromide) .Marland Kitchen.. 1 tablet once daily 13)  Tramadol Hcl 50 Mg Tabs (Tramadol hcl) .... Take 1 tablet by mouth two times a day as needed 14)  Furosemide 40 Mg Tabs (Furosemide) .... Take 1 tablet by mouth two times a day 15)  Potassium Chloride Crys Cr 20 Meq Cr-tabs (Potassium chloride crys cr) .... Take 1 tablet by mouth two times a day  Other Orders: T-Basic Metabolic Panel [id number])  Patient Instructions: 1)  F/UI in 7 to 10 days with  Pa 2)  Labs today, chem 7 and BNP  not stat 3)  CXR today also venous  doppler . 4)  You will be referred to cardiology Prescriptions: POTASSIUM CHLORIDE CRYS CR 20 MEQ CR-TABS (POTASSIUM CHLORIDE CRYS CR) Take 1 tablet by mouth two times a day  #40 x 0   Entered and Authorized by:   Syliva Overman MD   Signed by:   Syliva Overman MD on 01/13/2010   Method used:   Electronically to        [facility]* (retail)       [address] [place], [place]  [address]       Ph: [phone]       Fax: [phone]   RxID:   [id number] FUROSEMIDE 40 MG TABS (FUROSEMIDE) Take 1 tablet by mouth two times a day  #40 x 0   Entered and Authorized by:   Syliva Overman MD   Signed by:   Syliva Overman MD on 01/13/2010   Method used:   Electronically to        [facility]* (retail)       [address] [place], [place]  [address]       Ph: [phone]       Fax: [phone]   RxID:   [id number] TRAMADOL HCL 50 MG TABS (TRAMADOL HCL) Take 1 tablet by mouth two times a day as needed  #20 x 0   Entered and Authorized by:   Syliva Overman MD   Signed by:   Syliva Overman MD on 01/13/2010   Method used:   Electronically to        [facility]* (retail)       [address]       [place], [place]  [address]       Ph: [phone]       Fax: [phone]   RxID:   [id number]    Medication Administration  Injection # 1:    Medication: Furosemide- Lasix Injection    Diagnosis: CHF (ICD-428.0)    Route: IM    Site: LUOQ gluteus    Exp Date: 08/2010    Lot #: [id number]    Mfr: hospira    Comments: 10mg  given  Patient tolerated injection without complications    Given by: Everitt Amber LPN (January 13, 2010 3:36 PM)  Orders Added: 1)  CXR- 2view [CXR] 2)  Est. Patient Level IV [[address]] 3)  T-BNP  (B Natriuretic Peptide) [[id number]] 4)  T-Basic Metabolic Panel [[id number]] 5)  Radiology Referral [Radiology] 6)  Furosemide-  Lasix Injection [J1940] 7)  Admin of Therapeutic Inj  intramuscular or subcutaneous [96[address]] 8)  Cardiology Referral [Cardiology]

## 2010-07-29 NOTE — Miscellaneous (Signed)
Summary: faxed referral for ncs to [facility]  Clinical Lists Changes

## 2010-07-29 NOTE — Progress Notes (Signed)
Summary: MEDICINE  Phone Note Call from Patient   Summary of Call: NEEDS HIS TRAMADOL CALLED INTO [facility] Initial call taken by: Lind Guest,  January 21, 2010 8:37 AM  Follow-up for Phone Call        Rx Called In Follow-up by: Adella Hare LPN,  January 21, 2010 10:29 AM    Prescriptions: TRAMADOL HCL 50 MG TABS (TRAMADOL HCL) Take 1 tablet by mouth two times a day as needed  #20 x 0   Entered by:   Adella Hare LPN   Authorized by:   Esperanza Sheets PA   Signed by:   Adella Hare LPN on 16/03/9603   Method used:   Electronically to        [facility] (retail)       [address]       [place], [place]  [address]       Ph: [phone]       Fax: [phone]   RxID:   [id number]   Appended Document: MEDICINE patient aware

## 2010-07-29 NOTE — Assessment & Plan Note (Signed)
Summary: 2 WK ROV Nicholas [name]  RS'D SOONER PER TRIAGE/NWS   Vital Signs:  Patient profile:   55 year old male Height:      71.75 inches (182.25 cm) Weight:      415.25 pounds (188.75 kg) BMI:     56.92 O2 Sat:      93 % on Room air Temp:     97.0 degrees F (36.11 degrees C) oral Pulse rate:   70 / minute BP sitting:   112 / 60  (left arm) Cuff size:   large  Vitals Entered By: Brenton Grills MA (December 31, 2009 8:52 AM)  O2 Flow:  Room air CC: 2 wk f/u/aj   Vision Screening:Left eye w/o correction: 20 / 30 Right Eye w/o correction: 20 / 40 Both eyes w/o correction:  20/ 25        Vision Entered By: Brenton Grills MA (December 31, 2009 9:07 AM)   Referring Provider:  sampson  CC:  2 wk f/u/aj .  History of Present Illness: pt has increased humulin 70/30, to 160 units two times a day.  he brings a record of his cbg's which i have reviewed today.  it varies from 150-250, with no trend throughout the day.   pt states he feels well in general.  Current Medications (verified): 1)  Niaspan 750 Mg Cr-Tabs (Niacin (Antihyperlipidemic)) .... Two Tabs By Mouth At Bedtime 2)  Metformin Hcl 1000 Mg Tabs (Metformin Hcl) .... One Tab By Mouth Two Times A Day 3)  Androgel Pump 1 % Gel (Testosterone) .... Apply Four Pumps Once A Day As Directed 4)  Aspirin 81 Mg Tabs (Aspirin) .... One Tab By Mouth Once Daily 5)  Simvastatin 40 Mg Tabs (Simvastatin) .... One Tab By Mouth Once Daily 6)  Lisinopril 20 Mg Tabs (Lisinopril) .... One Tab By Mouth Once Daily 7)  Nabumetone 750 Mg Tabs (Nabumetone) .... One Tab By Mouth Two Times A Day 8)  Lamotrigine 200 Mg Tabs (Lamotrigine) .... One Tab By Mouth Once Daily 9)  Combigan 0.2-0.5 % Soln (Brimonidine Tartrate-Timolol) .... One Drop in Each Eye Two Times A Day 10)  Bd Pen Needle Short U/f 31g X 8 Mm Misc (Insulin Pen Needle) .... Use Two Times A Day As Directed 11)  Humulin 70/30 70-30 % Susp (Insulin Isophane & Regular) .... 50 Units Two Times A Day, and  Syringes Two Times A Day 12)  Citalopram Hydrobromide 40 Mg Tabs (Citalopram Hydrobromide) .Marland Kitchen.. 1 Tablet Once Daily  Allergies (verified): No Known Drug Allergies  Past History:  Past Medical History: Last updated: 12/12/2009 Diabetes mellitus, type II Hyperlipidemia Testosterone deficency Hypertension Poor leg circulation MRSA Chronic knee pain Depression Glaucoma  Review of Systems  The patient denies hypoglycemia.    Physical Exam  General:  morbidly obese.  no distress  Skin:  insulin injection sites at anterior abdomen are normal, except for a few ecchymoses.   Impression & Recommendations:  Problem # 1:  DIABETES MELLITUS, TYPE II, UNCONTROLLED (ICD-250.02) Assessment Improved  Medications Added to Medication List This Visit: 1)  Humulin 70/30 70-30 % Susp (Insulin isophane & regular) .Marland KitchenMarland Kitchen. 170 units two times a day, and 1 cc syringes two times a day 2)  Citalopram Hydrobromide 40 Mg Tabs (Citalopram hydrobromide) .Marland Kitchen.. 1 tablet once daily  Other Orders: Est. Patient Level III ([address])  Patient Instructions: 1)  check your blood sugar 2 times a day.  vary the time of day when you check, between before  the 3 meals, and at bedtime.  also check if you have symptoms of your blood sugar being too high or too low.  please keep a record of the readings and bring it to your next appointment here.  please call us sooner if you are having low blood sugar episodes. 2)  increase humulin 70/30 to 170 units two times a day (just before the first and last meals of the day). 3)  Please schedule a follow-up appointment in 1 month. Prescriptions: HUMULIN 70/30 70-30 % SUSP (INSULIN ISOPHANE & REGULAR) 170 units two times a day, and 1 cc syringes two times a day  #11 vials x 11   Entered and Authorized by:   Minus Breeding MD   Signed by:   Minus Breeding MD on 12/31/2009   Method used:   Electronically to        [facility] (retail)       [address]       [place], [place]  [address]       Ph: [phone]       Fax: [phone]   RxID:   [id number]

## 2010-07-29 NOTE — Letter (Signed)
Summary: history form  history form   Imported By: Jacklynn Ganong 03/25/2010 12:17:06  _____________________________________________________________________  External Attachment:    Type:   Image     Comment:   External Document

## 2010-07-29 NOTE — Letter (Signed)
Summary: *Orthopedic Consult Note  [facility]  [address] [place] [address]  [place], [place] [address]   Phone: [phone]  Fax: [phone]    Re:    CRISTIAN DAVITT DOB:    24-Jan-1956   Dear: Claris Che   Thank you for requesting that we see the above patient for consultation.  A copy of the detailed office note will be sent under separate cover, for your review.  Evaluation today is consistent with:  1)  CARPAL TUNNEL SYNDROME, BILATERAL (ICD-354.0)   Our recommendation is for: carpal tunnel bracing, vitamin B6, Neurontin 100 mg 3 times a day, nerve conduction study followup 6 weeks       Thank you for this opportunity to look after your patient.  Sincerely,   Terrance Mass. MD.

## 2010-07-31 NOTE — Progress Notes (Signed)
Summary: [facility] call  Phone Note From Other Clinic   Caller: [facility] Summary of Call: [facility] called stating that pt was in there officefor chronic pain. They stated they could help him with the pain but couldn't give out pain medicine. Pt got on phone and stated he didn't need the therpy part for the pain. He need pain medicine. He wants to be referred to dr. Gerilyn Pilgrim so he can get meds are whoever gives pain medicine. I'm not sure what you would like for me to do. Will you please let me know to  refer him are don't.  Initial call taken by: Rudene Anda,  July 15, 2010 3:31 PM  Follow-up for Phone Call        pls refer to Dr Gerilyn Pilgrim for chronic pain management, does this place [facility] not prescribe pain meds, if so we will not refer there again for pain scripts, order put in referral box also Follow-up by: Syliva Overman MD,  July 15, 2010 5:22 PM  Additional Follow-up for Phone Call Additional follow up Details #1::        pt was referred to dr. Gerilyn Pilgrim for pain management. pt was called and told that dr. Harriett Sine office would call him with appt and time. pt aware Additional Follow-up by: Rudene Anda,  July 17, 2010 10:21 AM

## 2010-07-31 NOTE — Progress Notes (Signed)
Summary: needs ref for a pain management dr  Phone Note Call from Patient   Summary of Call: called last month and said that he was told that he he would be ref. to a pain management doctor and wants to go to the one in [place] he was told that one in [place] and one in [place] so he wants to go to [place] Initial call taken by: Lind Guest,  July 03, 2010 10:30 AM  Follow-up for Phone Call        referral put in box Follow-up by: Syliva Overman MD,  July 07, 2010 1:12 PM  Additional Follow-up for Phone Call Additional follow up Details #1::        [facility] [address] [place], [place], [place] [address] [phone] called and made appt for 07/09/2010 8:00. pt notified  Additional Follow-up by: Rudene Anda,  July 07, 2010 2:33 PM

## 2010-07-31 NOTE — Letter (Signed)
Summary: diabetic shoes  diabetic shoes   Imported By: Lind Guest 07/23/2010 17:22:13  _____________________________________________________________________  External Attachment:    Type:   Image     Comment:   External Document

## 2010-07-31 NOTE — Progress Notes (Signed)
Summary: OV due  Phone Note Outgoing Call Call back at [facility] Phone [phone]   Call placed by: Brenton Grills CMA Duncan Dull),  July 21, 2010 2:39 PM Call placed to: Patient Details for Reason: OV due Summary of Call: Per MD, pt is due for F/U OV.  Follow-up for Phone Call        Appointment scheduled 09/05/2010 1:00pm Follow-up by: Brenton Grills CMA Duncan Dull),  July 21, 2010 2:54 PM

## 2010-07-31 NOTE — Progress Notes (Signed)
Summary: medicine  Phone Note Call from Patient   Summary of Call: left mesaage that the phar. will be faxing over some rx and also calling about medication call back  Initial call taken by: Lind Guest,  July 07, 2010 11:42 AM  Follow-up for Phone Call        he cancelled appt in Jan his pain mx will be thru pain specialist as discussed before, he needs to keep feb appt re his diabetes, he recentrly changed his request to a pain doc in [place] , I do not know when they will see him, i will not refill hiscurrent pain meds unless he comes in pls explain Follow-up by: Syliva Overman MD,  July 07, 2010 1:10 PM  Additional Follow-up for Phone Call Additional follow up Details #1::        Not sure why this is with me again, pls adress refill requests as outlined above , none on pain med Additional Follow-up by: Syliva Overman MD,  July 07, 2010 2:51 PM  New Problems: OTHER CHRONIC PAIN (ICD-338.29)   Additional Follow-up for Phone Call Additional follow up Details #2::    Patient states pharmacy will fax [place] for his refills. Follow-up by: Adella Hare LPN,  July 07, 2010 3:24 PM  New Problems: OTHER CHRONIC PAIN (ICD-338.29)

## 2010-08-04 ENCOUNTER — Encounter: Payer: Self-pay | Admitting: Family Medicine

## 2010-08-05 ENCOUNTER — Encounter: Payer: Self-pay | Admitting: Family Medicine

## 2010-08-06 NOTE — Medication Information (Signed)
Summary: Humulin/[employer]  Humulin/[employer]   Imported By: Sherian Rein 08/01/2010 11:42:28  _____________________________________________________________________  External Attachment:    Type:   Image     Comment:   External Document

## 2010-08-07 ENCOUNTER — Ambulatory Visit: Payer: Self-pay | Admitting: Family Medicine

## 2010-08-14 NOTE — Assessment & Plan Note (Signed)
Summary: leg   Vital Signs:  Patient profile:   55 year old male Height:      71.5 inches Weight:      462 pounds O2 Sat:      99 % on Room air Pulse rate:   97 / minute Pulse rhythm:   regular Resp:     16 per minute BP sitting:   124 / 60  (left arm)  Vitals Entered By: Adella Hare LPN (July 23, 2010 2:50 PM)  O2 Flow:  Room air CC: left leg infection Is Patient Diabetic? Yes Comments did not bring meds to ov   Primary Care Provider:  Syliva Overman MD  CC:  left leg infection.  History of Present Illness: Accidentally grazed left leg  6 days ago when he was attempting to put on a shoe with his legs crossed, this has happened in the past with the other leg. He has had purulent drainage from the leg, denies fever or chills. Reports  that he has not been doing well. He has been unable to exerciose , and has gained alot of weight , which he finds extremely depressing Denies recent fever or chills. Denies sinus pressure, nasal congestion , ear pain or sore throat. Denies chest congestion, or cough productive of sputum. Denies chest pain, palpitations, PND, orthopnea or leg swelling. Denies abdominal pain, nausea, vomitting, diarrhea or constipation. Denies change in bowel movements or bloody stool. Denies dysuria , frequency, incontinence or hesitancy.  Denies headaches, vertigo, seizures.      Allergies (verified): No Known Drug Allergies  Past History:  Past medical, surgical, family and social histories (including risk factors) reviewed, and no changes noted (except as noted below).  Past Medical History: Diabetes mellitus, type II, since 1996 Hyperlipidemia Testosterone deficency Hypertension Poor leg circulation MRSA Chronic knee pain Depression Glaucoma Hx of suicide attempt  Past Surgical History: Reviewed history from 12/12/2009 and no changes required. Surgery for kidney stones  Family History: Reviewed history from 03/24/2010 and no  changes required. mother deceased- DM, Leukemia father deceased- Heart dz, colon cancer, brain hemmorage, parkinsons, altzheimers, glacoma 5 brothers- health status unknown 5 sisters- health status unknown FH of Cancer:  Family History of Diabetes Family History Coronary Heart Disease male < 65  Social History: Reviewed history from 03/24/2010 and no changes required. Disabled  since 2004 or 2005 Separated no dependents Never Smoked Alcohol use-no nho caffeine use Drug use-no Regular exercise-yes- [employer] 12th grade ed  Review of Systems      See HPI Eyes:  Denies discharge and red eye. Derm:  Complains of lesion(s) and poor wound healing. Psych:  Complains of depression; denies suicidal thoughts/plans, thoughts of violence, and unusual visions or sounds. Endo:  Denies excessive thirst and excessive urination. Heme:  Complains of abnormal bruising. Allergy:  Denies hives or rash and itching eyes.  Physical Exam  General:  Morbidly obese male in no C/p distress. HEENT: No facial asymmetry,  EOMI, No sinus tenderness, TM's Clear, oropharynx  pink and moist.   Chest: Clear to auscultation bilaterally.  CVS: S1, S2, No murmurs, No S3.   Abd: Soft, Nontender.  MS: decreased  ROM spine, hips, shoulders and knees.  Ext: No edema.   CNS: CN 2-12 intact, power tone and sensation normal throughout.   Skin: ulcer on left leg anterior aspect, erythema and purulent drainage Psych: Good eye contact, normal affect.  Memory intact, not anxious or depressed appearing.   Diabetes Management Exam:    Foot  Exam (with socks and/or shoes not present):       Sensory-Monofilament:          Left foot: diminished          Right foot: diminished       Inspection:          Left foot: normal          Right foot: normal       Nails:          Left foot: thickened          Right foot: thickened   Impression & Recommendations:  Problem # 1:  DIABETIC ULCER, LEFT LEG (ICD-707.10) Assessment  Comment Only  Orders: Rocephin  250mg  (Y7829) Admin of Therapeutic Inj  intramuscular or subcutaneous (56213) Medicare Electronic Prescription (Y8657)  Problem # 2:  OTHER CHRONIC PAIN (ICD-338.29) Assessment: Unchanged  Orders: Medicare Electronic Prescription (Q4696)  Problem # 3:  CARPAL TUNNEL SYNDROME, BILATERAL (ICD-354.0) Assessment: Unchanged  Problem # 4:  HYPERTENSION (ICD-401.9) Assessment: Unchanged  His updated medication list for this problem includes:    Lisinopril 20 Mg Tabs (Lisinopril) ..... One tab by mouth once daily  BP today: 124/60 Prior BP: 120/70 (05/12/2010)  Labs Reviewed: K+: 5.0 (01/13/2010) Creat: : 0.87 (01/13/2010)   Chol: 147 (01/29/2010)   HDL: 36 (01/29/2010)   LDL: 62 (01/29/2010)   TG: 246 (01/29/2010)  Problem # 5:  MORBID OBESITY (ICD-278.01) Assessment: Deteriorated  Ht: 71.5 (07/23/2010)   Wt: 462 (07/23/2010)   BMI: 62.72 (05/12/2010)  Complete Medication List: 1)  Niaspan 750 Mg Cr-tabs (Niacin (antihyperlipidemic)) .... Two tabs by mouth at bedtime 2)  Metformin Hcl 1000 Mg Tabs (Metformin hcl) .... One tab by mouth two times a day 3)  Androgel Pump 1 % Gel (Testosterone) .... Apply 3 pumps once a day as directed 4)  Aspirin 81 Mg Tabs (Aspirin) .... One tab by mouth once daily 5)  Simvastatin 40 Mg Tabs (Simvastatin) .... One tab by mouth once daily 6)  Lisinopril 20 Mg Tabs (Lisinopril) .... One tab by mouth once daily 7)  Nabumetone 750 Mg Tabs (Nabumetone) .... One tab by mouth two times a day 8)  Lamotrigine 200 Mg Tabs (Lamotrigine) .... One tab by mouth once daily 9)  Combigan 0.2-0.5 % Soln (Brimonidine tartrate-timolol) .... One drop in each eye two times a day 10)  Bd Pen Needle Short U/f 31g X 8 Mm Misc (Insulin pen needle) .... Use two times a day as directed 11)  Humulin 70/30 70-30 % Susp (Insulin isophane & regular) .Marland KitchenMarland Kitchen. 170 units with breakfast, and 160 with evening meal), and 1 cc syringes (2 syringes, two times  a day) 12)  Citalopram Hydrobromide 40 Mg Tabs (Citalopram hydrobromide) .Marland Kitchen.. 1 tablet once daily 13)  Tramadol Hcl 50 Mg Tabs (Tramadol hcl) .... Take 1 tablet by mouth three times a day as needed 14)  Hydrocodone-acetaminophen 7.5-325 Mg Tabs (Hydrocodone-acetaminophen) .... Take one tablet two times a day for carpal tunnel pain 15)  Neurontin 100 Mg Caps (Gabapentin) .Marland Kitchen.. 1 by mouth three times a day 16)  Alphagan P 0.1 % Soln (Brimonidine tartrate) .... One drop in each eye two times a day 17)  Alphagan P 0.15 % Soln (Brimonidine tartrate) .Marland Kitchen.. 1 drop in both eyes two times a day 18)  Vitamin B-6 50 Mg Tabs (Pyridoxine hcl) .Marland Kitchen.. 1 by mouth two times a day 19)  Doxycycline Hyclate 100 Mg Caps (Doxycycline hyclate) .... Take 1 capsule by mouth  two times a day  Patient Instructions: 1)  Cancel sooner appt. 2)  F/U in 3 months. 3)  You are getting Rocephin 500mg  Im today for the leg ulcer and a 10  course of antibiotic. 4)  Keep area clean and dry, and cover loosesly with dry gauze dressing.At least once daily, up to 3 times daily. 5)  Call if ulcer is not healing well for a referral to a wound Doc Prescriptions: HYDROCODONE-ACETAMINOPHEN 7.5-325 MG TABS (HYDROCODONE-ACETAMINOPHEN) take one tablet two times a day for carpal tunnel pain  #60 x 1   Entered by:   Adella Hare LPN   Authorized by:   Syliva Overman MD   Signed by:   Adella Hare LPN on 16/03/9603   Method used:   Printed then faxed to ...       [facility]* (retail)       [address]       [place], [place]  [address]       Ph: [phone]       Fax: [phone]   RxID:   [id number] DOXYCYCLINE HYCLATE 100 MG CAPS (DOXYCYCLINE HYCLATE) Take 1 capsule by mouth two times a day  #20 x 0   Entered and Authorized by:   Syliva Overman MD   Signed by:   Syliva Overman MD on 07/23/2010   Method used:   Electronically to        [facility]* (retail)       [address]  [address] [place], [place]  [address]       Ph: [phone]       Fax: [phone]   RxID:   [id number]    Medication Administration  Injection # 1:    Medication: Rocephin  250mg     Diagnosis: DIABETIC ULCER, LEFT LEG (ICD-707.10)    Route: IM    Site: L deltoid    Exp Date: 05/14    Lot #: [id number]    Mfr: [email or web address]    Comments: rocephin 500mg  given    Patient tolerated injection without complications    Given by: Adella Hare LPN (July 23, 2010 4:11 PM)  Orders Added: 1)  Est. Patient Level IV [09323] 2)  Rocephin  250mg  [J0696] 3)  Admin of Therapeutic Inj  intramuscular or subcutaneous [96372] 4)  Medicare Electronic Prescription [G8553]     Medication Administration  Injection # 1:    Medication: Rocephin  250mg     Diagnosis: DIABETIC ULCER, LEFT LEG (ICD-707.10)    Route: IM    Site: L deltoid    Exp Date: 05/14    Lot #: [id number]    Mfr: [email or web address]    Comments: rocephin 500mg  given    Patient tolerated injection without complications    Given by: Adella Hare LPN (July 23, 2010 4:11 PM)  Orders Added: 1)  Est. Patient Level IV [02542] 2)  Rocephin  250mg  [J0696] 3)  Admin of Therapeutic Inj  intramuscular or subcutaneous [96372] 4)  Medicare Electronic Prescription [H0623]

## 2010-08-14 NOTE — Letter (Signed)
Summary: eye exam  eye exam   Imported By: Lind Guest 08/06/2010 11:30:35  _____________________________________________________________________  External Attachment:    Type:   Image     Comment:   External Document

## 2010-08-22 ENCOUNTER — Telehealth (INDEPENDENT_AMBULATORY_CARE_PROVIDER_SITE_OTHER): Payer: Self-pay | Admitting: *Deleted

## 2010-08-22 ENCOUNTER — Encounter: Payer: Self-pay | Admitting: Endocrinology

## 2010-08-24 ENCOUNTER — Encounter: Payer: Self-pay | Admitting: Endocrinology

## 2010-09-04 NOTE — Progress Notes (Signed)
Summary: PA-Humulin  Phone Note From Pharmacy   Summary of Call: PA-Humulin faxed to [facility] @ [phone], awaiting approval. Dagoberto Reef  August 22, 2010 9:11 AM Approved until 08/25/11, pt aware. Initial call taken by: Dagoberto Reef,  August 26, 2010 12:21 PM

## 2010-09-04 NOTE — Medication Information (Signed)
Summary: Prior Authorization for Humulin / [employer]  Prior Authorization for Humulin / [employer]   Imported By: Lennie Odor 08/27/2010 15:05:06  _____________________________________________________________________  External Attachment:    Type:   Image     Comment:   External Document

## 2010-09-04 NOTE — Medication Information (Signed)
Summary: Approved for Humulin / [employer]  Approved for Humulin / [employer]   Imported By: Lennie Odor 08/27/2010 15:06:09  _____________________________________________________________________  External Attachment:    Type:   Image     Comment:   External Document

## 2010-09-05 ENCOUNTER — Ambulatory Visit: Payer: Self-pay | Admitting: Endocrinology

## 2010-09-14 LAB — BASIC METABOLIC PANEL
Calcium: 9.4 mg/dL (ref 8.4–10.5)
Creatinine, Ser: 1.04 mg/dL (ref 0.4–1.5)
GFR calc Af Amer: >60 mL/min (ref >60)
Sodium: 132 mEq/L — ABNORMAL LOW (ref 135–145)

## 2010-09-14 LAB — GLUCOSE, CAPILLARY
Glucose-Capillary: 322 mg/dL — ABNORMAL HIGH (ref 70–99)
Glucose-Capillary: 330 mg/dL — ABNORMAL HIGH (ref 70–99)
Glucose-Capillary: 432 mg/dL — ABNORMAL HIGH (ref 70–99)

## 2010-09-14 LAB — DIFFERENTIAL
Lymphocytes Relative: 22 % (ref 12–46)
Lymphs Abs: 2.2 10*3/uL (ref 0.7–4.0)
Monocytes Relative: 8 % (ref 3–12)
Neutro Abs: 6.6 10*3/uL (ref 1.7–7.7)
Neutrophils Relative %: 66 % (ref 43–77)

## 2010-09-14 LAB — CBC
Hemoglobin: 14.7 g/dL (ref 13.0–17.0)
Platelets: 208 10*3/uL (ref 150–400)
RBC: 4.84 MIL/uL (ref 4.22–5.81)
WBC: 10.1 10*3/uL (ref 4.0–10.5)

## 2010-09-16 ENCOUNTER — Other Ambulatory Visit: Payer: Self-pay | Admitting: *Deleted

## 2010-09-16 DIAGNOSIS — M79606 Pain in leg, unspecified: Secondary | ICD-10-CM

## 2010-09-16 MED ORDER — GABAPENTIN 100 MG PO CAPS: 100.0000 mg | ORAL_CAPSULE | Freq: Three times a day (TID) | ORAL | Status: DC

## 2010-09-29 ENCOUNTER — Telehealth: Payer: Self-pay | Admitting: Family Medicine

## 2010-09-30 ENCOUNTER — Other Ambulatory Visit: Payer: Self-pay

## 2010-09-30 DIAGNOSIS — R600 Localized edema: Secondary | ICD-10-CM

## 2010-09-30 MED ORDER — POTASSIUM CHLORIDE CRYS ER 20 MEQ PO TBCR: 20.0000 meq | EXTENDED_RELEASE_TABLET | Freq: Every day | ORAL | Status: DC

## 2010-09-30 MED ORDER — FUROSEMIDE 20 MG PO TABS: 20.0000 mg | ORAL_TABLET | Freq: Every day | ORAL | Status: DC

## 2010-09-30 NOTE — Telephone Encounter (Signed)
Advise stop steam room, and erx furosemide 20mg  one daily as needed #10 only, also potassium one daily with furosemide #10 only pls

## 2010-09-30 NOTE — Telephone Encounter (Signed)
Patient aware. Meds sent to [facility]

## 2010-10-06 ENCOUNTER — Ambulatory Visit: Payer: Self-pay | Admitting: Family Medicine

## 2010-10-06 ENCOUNTER — Other Ambulatory Visit: Payer: Self-pay | Admitting: Family Medicine

## 2010-10-06 LAB — GLUCOSE, CAPILLARY
Glucose-Capillary: 161 mg/dL — ABNORMAL HIGH (ref 70–99)
Glucose-Capillary: 164 mg/dL — ABNORMAL HIGH (ref 70–99)
Glucose-Capillary: 178 mg/dL — ABNORMAL HIGH (ref 70–99)
Glucose-Capillary: 184 mg/dL — ABNORMAL HIGH (ref 70–99)
Glucose-Capillary: 110 mg/dL — ABNORMAL HIGH (ref 70–99)
Glucose-Capillary: 142 mg/dL — ABNORMAL HIGH (ref 70–99)
Glucose-Capillary: 214 mg/dL — ABNORMAL HIGH (ref 70–99)

## 2010-10-06 LAB — DIFFERENTIAL
Basophils Absolute: 0 10*3/uL (ref 0.0–0.1)
Basophils Absolute: 0.1 10*3/uL (ref 0.0–0.1)
Basophils Relative: 0 % (ref 0–1)
Eosinophils Absolute: 0.4 10*3/uL (ref 0.0–0.7)
Eosinophils Relative: 4 % (ref 0–5)
Eosinophils Relative: 4 % (ref 0–5)
Eosinophils Relative: 4 % (ref 0–5)
Lymphocytes Relative: 20 % (ref 12–46)
Lymphocytes Relative: 16 % (ref 12–46)
Lymphocytes Relative: 19 % (ref 12–46)
Lymphs Abs: 2.2 10*3/uL (ref 0.7–4.0)
Lymphs Abs: 2.3 10*3/uL (ref 0.7–4.0)
Lymphs Abs: 2.4 10*3/uL (ref 0.7–4.0)
Monocytes Absolute: 1 10*3/uL (ref 0.1–1.0)
Monocytes Absolute: 0.8 10*3/uL (ref 0.1–1.0)
Monocytes Absolute: 0.9 10*3/uL (ref 0.1–1.0)
Monocytes Relative: 8 % (ref 3–12)
Monocytes Relative: 7 % (ref 3–12)
Neutro Abs: 8 10*3/uL — ABNORMAL HIGH (ref 1.7–7.7)

## 2010-10-06 LAB — CBC
HCT: 38.6 % — ABNORMAL LOW (ref 39.0–52.0)
HCT: 40.2 % (ref 39.0–52.0)
HCT: 38.6 % — ABNORMAL LOW (ref 39.0–52.0)
HCT: 39.2 % (ref 39.0–52.0)
Hemoglobin: 13.6 g/dL (ref 13.0–17.0)
Hemoglobin: 13.5 g/dL (ref 13.0–17.0)
Hemoglobin: 13.7 g/dL (ref 13.0–17.0)
MCHC: 35 g/dL (ref 30.0–36.0)
MCV: 89.2 fL (ref 78.0–100.0)
RBC: 4.32 MIL/uL (ref 4.22–5.81)
RBC: 4.5 MIL/uL (ref 4.22–5.81)
RDW: 15.1 % (ref 11.5–15.5)
RDW: 14.9 % (ref 11.5–15.5)
WBC: 11.2 10*3/uL — ABNORMAL HIGH (ref 4.0–10.5)
WBC: 11.7 10*3/uL — ABNORMAL HIGH (ref 4.0–10.5)
WBC: 12.2 10*3/uL — ABNORMAL HIGH (ref 4.0–10.5)

## 2010-10-06 LAB — BASIC METABOLIC PANEL
CO2: 29 mEq/L (ref 19–32)
CO2: 27 mEq/L (ref 19–32)
Chloride: 103 mEq/L (ref 96–112)
Chloride: 104 mEq/L (ref 96–112)
Creatinine, Ser: 0.96 mg/dL (ref 0.4–1.5)
GFR calc Af Amer: >60 mL/min (ref >60)
GFR calc non Af Amer: >60 mL/min (ref >60)
Glucose, Bld: 199 mg/dL — ABNORMAL HIGH (ref 70–99)
Potassium: 4 mEq/L (ref 3.5–5.1)
Potassium: 4.6 mEq/L (ref 3.5–5.1)
Sodium: 137 mEq/L (ref 135–145)
Sodium: 138 mEq/L (ref 135–145)

## 2010-10-06 LAB — HEMOGLOBIN A1C
Hgb A1c MFr Bld: 7.2 % — ABNORMAL HIGH (ref 4.6–6.1)
Mean Plasma Glucose: 160 mg/dL

## 2010-10-06 LAB — COMPREHENSIVE METABOLIC PANEL
Alkaline Phosphatase: 68 U/L (ref 39–117)
BUN: 9 mg/dL (ref 6–23)
BUN: 9 mg/dL (ref 6–23)
CO2: 26 mEq/L (ref 19–32)
CO2: 29 mEq/L (ref 19–32)
Calcium: 9.4 mg/dL (ref 8.4–10.5)
Chloride: 102 mEq/L (ref 96–112)
Creatinine, Ser: 0.88 mg/dL (ref 0.4–1.5)
GFR calc non Af Amer: >60 mL/min (ref >60)
Glucose, Bld: 149 mg/dL — ABNORMAL HIGH (ref 70–99)
Glucose, Bld: 156 mg/dL — ABNORMAL HIGH (ref 70–99)
Potassium: 3.9 mEq/L (ref 3.5–5.1)
Total Bilirubin: 0.5 mg/dL (ref 0.3–1.2)

## 2010-10-06 LAB — WOUND CULTURE

## 2010-10-16 ENCOUNTER — Other Ambulatory Visit: Payer: Self-pay | Admitting: Family Medicine

## 2010-10-27 ENCOUNTER — Telehealth: Payer: Self-pay | Admitting: Family Medicine

## 2010-10-27 NOTE — Telephone Encounter (Signed)
Advise and erx tessalon perles 100mg  one daily as needed for cough #30 refill 1 pls

## 2010-10-28 MED ORDER — BENZONATATE 100 MG PO CAPS: 100.0000 mg | ORAL_CAPSULE | Freq: Three times a day (TID) | ORAL | Status: DC | PRN

## 2010-10-28 NOTE — Telephone Encounter (Signed)
Med sent as prescribed

## 2010-11-11 NOTE — Discharge Summary (Signed)
Nicholas [name], Nicholas [name] NO.:  [id number]   MEDICAL RECORD NO.:  [id number]          PATIENT TYPE:  OBV   LOCATION:  A313                          FACILITY:  [facility]   PHYSICIAN:  Renee Ramus, MD       DATE OF BIRTH:  02-08-1956   DATE OF ADMISSION:  12/10/2008  DATE OF DISCHARGE:  LH                               DISCHARGE SUMMARY   PRIMARY DISCHARGE DIAGNOSIS:  Methicillin-resistant staphylococcus  aureus cellulitis.   SECONDARY DIAGNOSES:  1. 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  2. Morbid obesity.  3. Diabetes mellitus type 1.  4. Hypercholesterolemia.  5. Hypertriglyceridemia.  6. Peripheral vascular disease.  7. Depression.  8. Onychomycosis.   HOSPITAL COURSE:  1. MRSA cellulitis.  The patient is a 55 year old male who was      admitted secondary to left foot pain.  The patient was diagnosed      with cellulitis.  He was started on vancomycin and Levaquin.  When      a culture was obtained, it was found to be MRSA with sensitivities      to Levaquin.  He will be continued on Levaquin 750 mg p.o. daily      x14 days post discharge.  The patient will follow up with his      surgeon.  Plain films showed deformities in the foot but it was not      thought to be secondary to osteomyelitis rather avascular necrosis.      The patient was too large to fit into an MRI and further      examination was thought to be deferred until after treatment of      underlying infection.  2. Diabetes mellitus type 1.  The patient will continue on his insulin      regimen.  3. Hypercholesterolemia.  The patient will continue his statin      therapy.  4. Hypertriglyceridemia.  The patient will continue with Niaspan.  5. Peripheral vascular disease.  The patient has been placed on      aspirin 81 mg p.o. daily.  6. Depression.  The patient will continue SSRI.  7. Morbid obesity.  The patient has a therapeutic thyroid and has been      counseled  with respect to weight loss.  8. Onychomycosis.  The patient will continue Lamisil 250 mg p.o. daily      x12 weeks with followup with his primary care physician and      surgeon.   Labs of note,  1. Leukocytosis, white count of 12.2 decreasing to 11.2.  2. Blood glucose elevated between the ranges of 119 and 214.  3. Normal BUN and creatinine.  4. Hemoglobin A1c of 7.2.  5. CRP initially of 8.7.  6. TSH of 3.4 with a free T4 of 0.88.   STUDIES:  1. Bilateral venous ultrasounds showing no evidence of deep venous      thrombosis.  2. Plain film of left foot showing irregularity and sclerosis      involving the head of the second metatarsal  of the left foot which      may be secondary to trauma or surgery or avascular necrosis.      Osteomyelitis myelitis was not excluded and soft tissue swelling      was also noted.   MEDICATIONS ON DISCHARGE:  1. Lisinopril initially 20 mg p.o. b.i.d., this had been changed to 40      mg p.o. daily.  2. Lamictal 200 mg p.o. daily.  3. Betimol ophthalmic ribbons 1 both eyes daily of 0.5% strength.  4. Simvastatin 40 mg p.o. nightly.  5. Citalopram 40 mg p.o. nightly.  6. Metformin which has been discontinued.  7. Niaspan 1500 mg p.o. nightly.  8. Tramadol 50 mg p.o. q.i.d.  9. AndroGel 1% apply nightly.  10.Levemir 64 units subcu daily.  11.Levofloxacin 750 mg p.o. daily x14 days.  12.Lamisil 250 mg p.o. daily x12 weeks.  13.Aspirin 81 mg p.o. daily.   There are no labs or studies pending at the time of discharge.  The  patient is in stable condition and anxious for discharge.   Time spent 35 minutes.      Renee Ramus, MD  Electronically Signed     JF/MEDQ  D:  12/13/2008  T:  12/14/2008  Job:  [id number]

## 2010-11-11 NOTE — Op Note (Signed)
NAME:  Nicholas [name], Nicholas [name] NO.:  [id number]   MEDICAL RECORD NO.:  [id number]          PATIENT TYPE:  AMB   LOCATION:  DAY                           FACILITY:  [facility]   PHYSICIAN:  R. Roetta Sessions, M.D. DATE OF BIRTH:  11/23/1955   DATE OF PROCEDURE:  04/06/2008  DATE OF DISCHARGE:                               OPERATIVE REPORT   INDICATIONS FOR PROCEDURE:  A 55 year old gentleman with no lower GI  tract symptoms (except for a small amount of blood per rectum this  morning with the use of enemas first time ever) referred at courtesy of  Dr. Clarise Cruz in [facility] in [place], [place] for colorectal  cancer screening.  He has never had his lower GI tract evaluated.  Family history is significant for father with colon cancer diagnosed in  his early 43s.  Colonoscopy is now being done as screening maneuver.  Risks, benefits, alternatives, and limitations have been reviewed,  questions answered, he is agreeable.  Please see documentation in the  medical record.   PROCEDURE NOTE:  O2 saturation, blood pressure, pulse, and respirations  were monitored throughout the entire procedure.   CONSCIOUS SEDATION:  Versed 5 mg IV, Demerol 75 mg IV in divided doses.   INSTRUMENT:  Pentax video chip system.   FINDINGS:  Digital rectal exam revealed single external hemorrhoidal  tag.  Otherwise was unremarkable.  Endoscopic Findings:  Prep was  adequate.  Colon:  Colonic mucosa was surveyed from the rectosigmoid  junction through the left transverse, right colon to the appendiceal  orifice, ileocecal valve, and cecum.  These structures were well seen  and photographed for the record.  From this level, scope was slowly and  cautiously withdrawn.  All previously mentioned mucosal surfaces were  again seen.  In the opposite the ileocecal valve, there was a 4-mm  bilobed sessile polyp, which was cold biopsy/removed.  There were 2  diminutive polyps in the rectosigmoid junction,  which were cold  biopsied.  Otherwise, the colonic mucosa appeared normal.  Scope was  pulled down the rectum.  Thorough examination of the rectal mucosa  including retroflexed view of the anal verge demonstrated no other  abnormalities.  The patient tolerated the procedure well and was  reactive in Endoscopy.   IMPRESSION:  1. Single external hemorrhoidal tag, otherwise normal rectum.  2. Two diminutive rectosigmoid polyps status post cold biopsy.  3. Polyp in the opposite the ileocecal valve status post cold biopsy.      Remainder of colonic mucosa appeared normal.   RECOMMENDATIONS:  1. Follow up on path.  2. Further recommendations to follow.      Jonathon Bellows, M.D.  Electronically Signed     RMR/MEDQ  D:  04/06/2008  T:  04/06/2008  Job:  [id number]   cc:   Joya Salm  Fax: [phone]

## 2010-11-11 NOTE — H&P (Signed)
NAME:  Nicholas [name], Nicholas [name] NO.:  [id number]   MEDICAL RECORD NO.:  [id number]          PATIENT TYPE:  EMS   LOCATION:  ED                            FACILITY:  [facility]   PHYSICIAN:  Renee Ramus, MD       DATE OF BIRTH:  1955/07/27   DATE OF ADMISSION:  12/10/2008  DATE OF DISCHARGE:  LH                              HISTORY & PHYSICAL   PRIMARY CARE PHYSICIAN:  Joya Salm   HISTORY OF PRESENT ILLNESS:  The patient is a 55 year old male with  complaints of left foot pain x4 days prior to admission.  The patient  actually has not had any appreciable amount of pain in his foot.  He  does have an excoriation or crusted lesion on the dorsal surface of his  left foot, and he was concerned that it was getting worse.  The patient  does not have a primary care physician in the area.  The patient does  have chronic tinea pedis.  The patient has risk factors for mild  immunocompromise including diabetes, morbid obesity, and poor hygiene.  The patient denies fevers, chills, night sweats, nausea, vomiting, PND,  or orthopnea.  Denies chest pain, shortness of breath, diarrhea, or  constipation.  The patient has had negative ultrasound of his lower  extremities for clot.   PAST MEDICAL HISTORY:  1. Hypertension.  2. Hyperlipidemia.  3. Depression.  4. Diabetes mellitus type 1.  5. Hypertriglyceridemia.  6. Hormone replacement therapy with testosterone.  7. History of peripheral vascular disease without known incident.   SOCIAL HISTORY:  The patient denies alcohol or tobacco use.  He was  separated from his wife.  He lives alone.   FAMILY HISTORY:  Not available.   REVIEW OF SYSTEMS:  All other comprehensive review of systems are  negative.   MEDICATIONS:  The patient has sensitivity to aspirin which causes  abdominal pain, but he is unsure of the dosage and when I asked him  specifically, he said he was taking Naprosyn which he felt was aspirin.  Otherwise, he has no  known drug allergies.   CURRENT MEDICATIONS:  1. Lisinopril 20 mg p.o. b.i.d.  2. Lamictal 200 mg p.o. daily.  3. Patanol ophthalmic drops 1 both eyes daily 0.5%.  4. Simvastatin 40 mg p.o. nightly.  5. Citalopram 40 mg p.o. nightly.  6. Metformin 1 g p.o. b.i.d.  7. Niaspan 1500 mg p.o. nightly.  8. Tramadol 50 mg p.o. q.i.d.  9. AndroGel 1% apply nightly, 1 tube.  10.Levemir 64 units subcu daily nightly.   PHYSICAL EXAMINATION:  GENERAL:  This is a morbidly obese white male  currently in no apparent distress.  VITAL SIGNS:  Blood pressure 156/93, heart rate 65, respiratory rate 19,  temperature 97.5.  HEENT:  No jugular venous distention or lymphadenopathy.  Oropharynx  appears clear.  Mucous membranes pink and moist.  Extraocular muscles  are intact.  CARDIOVASCULAR:  He has a regular rate and rhythm without murmurs, rubs,  or gallops.  PULMONARY:  Lungs are clear to auscultation bilaterally.  ABDOMEN:  Morbidly obese, nontender,  and nondistended.  He has no  rebound.  He has no guarding.  EXTREMITIES:  He has chronic venous stasis changes in the lower  extremities bilaterally.  He has evidence of tinea pedis as well as  onychomycosis.  He has a 2-3 cm lesion which is crusted and somewhat  oozing on the dorsal surface of his left foot.  A +1 to +2 woody edema.  NEURO:  Cranial nerves II through XII are grossly intact.  He has no  focal neurological deficits.   STUDIES:  1. Left foot film shows irregular bony surfaces which raised a      question of developing Charcot joint.  He also has swelling or the      possibility of gas formation.  2. Ultrasound of lower extremity shows no evidence of DVT.   LABORATORY DATA:  White count 9.9, H&H 13.5 and 38.6, MCV 90, platelets  188.  Sodium 138, potassium 4.6, chloride 104, bicarb 27, BUN 15,  creatinine 0.9, glucose 199.   ASSESSMENT AND PLAN:  1. Cellulitis.  The patient will not be able to fit into an MRI tube,      so we  will obtain CT scan of left foot to further evaluate for      possible osteomyelitis.  We will treat empirically with cefazolin 2      g IV q.8 h., but I do not believe the patient is showing active      signs of osteomyelitis and edema questionable.  He has no active      cellulitis, certainly has a chronic crusted wound.  They will have      a wound care to take a look at, but although I am putting him on      empiric antibiotics for now, he may not require these for any      extended duration.  2. Diabetes mellitus type 1, currently uncontrolled.  We will continue      with Levemir and add sliding scale insulin and hold metformin.  3. Hypercholesterolemia.  We will check a full lipid panel.  Continue      statin therapy.  4. Hypertriglyceridemia.  We will hold niacin for now and check full      lipid panel.  5. Hormone replacement therapy with testosterone.  We will hold      testosterone gel for now.  6. Peripheral vascular disease.  We will place the patient on      antiplatelet therapy, i.e., aspirin 81 mg p.o. daily and assess his      tolerance to this.  7. Depression.  Continue Lamictal and SSRI.  8. Tinea pedis, onychomycosis.  We will place the patient on Lamisil.  9. Disposition.  The patient is full code.   H and P was constructed by reviewing past medical history conferring  with emergency medical room physician reviewing the emergency medical  record.  Time spent 1 hour.      Renee Ramus, MD  Electronically Signed     JF/MEDQ  D:  12/10/2008  T:  12/11/2008  Job:  [id number]   cc:   Joya Salm  Fax: [phone]

## 2010-11-11 NOTE — Consult Note (Signed)
NAMEMARVELOUS, Nicholas [name] NO.:  [id number]   MEDICAL RECORD NO.:  [id number]          PATIENT TYPE:  OBV   LOCATION:  A313                          FACILITY:  [facility]   PHYSICIAN:  J. Darreld Mclean, M.D. DATE OF BIRTH:  [birth year]   DATE OF CONSULTATION:  12/11/2008  DATE OF DISCHARGE:                                 CONSULTATION   The patient at the request of Dr. Rito Ehrlich.   The patient is a 55 year old male with pain and tenderness in his left  foot.  He has got a cellulitis of the left foot.  There is a question  about some changes on his second metatarsal head and possibilities with  gas formation and his further review of the x-rays.  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   The patient's wound as of the dorsum of the left foot between the fifth  and fourth toe and third and fourth toe has got a bad moist on athlete's  foot type infection, tinea pedis, and some early cellulitis on the  dorsum of the foot.  The patient's sed rate is 25.  The patient's white  count was slightly elevated.  He is a diabetic.  He is massively obese.  He has been going to the [facility] and working out in the full and other  activities, and I think he is developed athlete's foot secondary to that  and his feet's and toes were very moist between the fifth toe,  particularly in the webspace in the fourth toe changes here related to  his tinea pedis.  I reviewed the chart.  I reviewed the history and  physical.  I reviewed the ER records.  These all incorporated by  reference, I will not repeat them again.   IMPRESSION:  1. 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  I do not      think this is infected and I do not think this is osteomyelitis.  2. Active supervision of cellulitis of the left foot toward the little      toe side, dorsum of the foot with  concurrent tinea pedis (athlete's      foot infection.)   PLAN:  Treat with antibiotics as you are doing, can take p.o.  antibiotics, Keflex would be acceptable, Levaquin, and Cipro.  I also  treated the tinea pedis.  He needs to keep caught between his toes and  dry out the area between his toes the way to moist up.  He may need to  see a dermatologist for his tinea pedis.   As far as his second metatarsal, I think this is old longstanding not  unusual with the patient and is body build, and I do not think he has  active chronic osteomyelitis.   I will see him as needed.  Again, he may need to a see dermatologist.           ______________________________  Shela Commons. Darreld Mclean, M.D.  JWK/MEDQ  D:  12/11/2008  T:  12/12/2008  Job:  [id number]

## 2010-11-12 ENCOUNTER — Telehealth: Payer: Self-pay | Admitting: Family Medicine

## 2010-11-12 NOTE — Telephone Encounter (Signed)
Patient called in and states that the benzonatate 100 mg isn't working.  He states that he goes and swims 3x a week, and that he does breath the gases in from the chlorine of the pool.  He says that he has been coughing up fleam and that it is like he has smoked for 20 years.  Please advise patient, I did let him know that Dr. Lodema Hong was out of the office until next week.  I also told him if he felt worse then what he does now to go to urgent care or ER. He said he didn't feel bad, and he was going to swim, you may leave message on machine.

## 2010-11-13 NOTE — Telephone Encounter (Signed)
He states when he goes to the [facility] and he breaths in that strong chlorine that it makes him have trouble breathing but he loves going to swim. Luann offered appt May 31 and to call back in the meantime for cancellations. Advised urgent care but he didn't want to go there

## 2010-11-14 NOTE — Discharge Summary (Signed)
[facility]  Patient:    Nicholas [name], Nicholas [name]                    MRN: [id number] Adm. Date:  [id number] Disc. Date: [id number] Attending:  Thermon Leyland                           Discharge Summary  ADMISSION DIAGNOSES:  1. Urosepsis.  2. Ureteral calculus.  3. Hydronephrosis.  4. Septicemia.  5. Diabetes.  6. Peripheral vascular disease.  7. Depression.  8. Morbid obesity.  9. Hypercholesterolemia. 10. ______ infection. 11. Tobacco use.  DISCHARGE DIAGNOSES:  1. Urosepsis.  2. Ureteral calculus.  3. Hydronephrosis.  4. Septicemia.  5. Diabetes.  6. Peripheral vascular disease.  7. Depression.  8. Morbid obesity.  9. Hypercholesterolemia. 10. ______ infection. 11. Tobacco use.  PROCEDURES:  Cystoscopy, retrograde studies and ureteral catheterization performed on February 03, 2000.  HISTORY OF PRESENT ILLNESS:  This 55 year old male has a past history of renal calculi, all which he has past.  The patient has been evaluated in the past by Dr. Isabel Caprice.  The patient presented to the emergency room with left-sided pain. Since he weighs over 500 pounds, he did not have a CT scan, but an IVP revealed an 8 x 12 mm stone in the left pelvis with associated hydronephrosis. The patient began to develop a temperature all the way up to 102 degrees.  He had a markedly abnormal urinalysis and multiple white cells and had a elevated white count of 17.8.  The patient was felt to be urgently sick and arrangements were made for him to be admitted to the hospital and taken directly to the operating room for stent placement.  The patients past medical history is well described in the admission history and physical.  MEDICATIONS ON ADMISSION:  Prozac, Zocor, Glucotrol, Accupril, Glucophage, Trental, Alphagan solution.  HOSPITAL COURSE:  The patient was taken to the operating room where cystoscopy and stent placement were performed.  The patients  postoperative course was unremarkable.  He had blood cultures done which came back negative.  The patients temperature decreased to normal once the stent had been placed. Gram-negative rods were noted in the urine, but for reasons unknown to me a urine culture was not sent despite having been appropriately ordered.  The patient was treated with Tobramycin.  Because of the gram-negative rods the blood cultures showed Proteus mirabilis that were pan-sensitive.  The patient was carefully followed by Dr. Isabel Caprice, who had been his physician in the past.  The patient was ready for discharge home by February 07, 2000.  The patient was afebrile at that time.  He was sensitive to quinolones and was sent home on Tequin as well as Vicodin.  He will have followup with Dr. Isabel Caprice, who will make arrangements to take out the stone, most likely with ureteroscopy. DD:  02/20/00 TD:  02/23/00 Job: [id number]

## 2010-11-14 NOTE — H&P (Signed)
[facility]  Patient:    Nicholas [name], Nicholas [name]                    MRN: [id number] Adm. Date:  [id number] Attending:  Londell Moh                         History and Physical  ADMITTING DIAGNOSES: 1. Hydronephrosis. 2. Sepsis. 3. Left renal calculus. 4. Diabetes. 5. Peripheral vascular disease. 6. Depression.  HISTORY OF PRESENT ILLNESS:  This 55 year old male has a past history of renal calculi, all of which he passed.  The patient has been evaluated in the past by Dr. Barron Alvine.  The patient presented to the emergency room today with left-sided pain.  Since he weighs over 500 pounds, he did not have a CT scan but had an IVP, which revealed an 8 x 12 mm stone in the left pelvis with associated hydronephrosis.  The patient was afebrile when he presented to the emergency room but during the evaluation, temperature shot up to 102 degrees. The patient had laboratory studies done, which showed a markedly abnormal urinalysis with multiple white cells as well as some red cells.  The patients CBC was abnormal with an elevated white count of 17.8.  The patients hematocrit was normal at 41.2.  He also had a metabolic panel, which showed a glucose of 224.  He is known to be a diabetic.  He had an acetone that was negative.  The patient was felt to have impending sepsis due to the combination of urinary tract infection and stone, and is to be admitted for emergent stent placement.  PAST MEDICAL HISTORY:  A brief history was obtained.  The patient notes that he has diabetes but has no known heart disease.  He is known to have peripheral vascular disease and elevated cholesterol.  He is also known to have depression.  The patient denies any previous surgery.  MEDICATIONS:  Prozac, Zocor, Glucotrol, Accupril, Glucophage, and Trental, as well as Alphagan eye drops.  The exact dosages were not available.  The patient was too ill to give significant details.   For the same reason, a full family history, social history, and review of systems was not obtained.  PHYSICAL EXAMINATION:  VITAL SIGNS:   The patients temperature was 97 but increased to 102 during the evaluation in the emergency room.  Pulse was 90, respirations 20, temperature 97.2.  GENERAL:  He is a well-developed, very overweight male sweating profusely secondary to impending sepsis.  ABDOMEN:  Soft and very obese.  The patient had demonstrable left flank pain.  LUNGS:  His lungs showed some rales and rhonchi.  GENITOURINARY:  Normal testicles bilaterally.  The patient is not circumcised.  EXTREMITIES:  Marked venous stasis changes.  LABORATORY DATA:  Laboratory values were as described above.  The patient had a normal EKG.  IVP has been described above.  IMPRESSION:  Hydronephrosis and impending sepsis.  PLAN:  Admit following placement of stent for decompression of the left kidney, initiation of IV antibiotics, and careful management of his diabetes. DD:  02/03/00 TD:  02/04/00 Job: [id number]

## 2010-11-14 NOTE — Op Note (Signed)
[facility]  Patient:    Nicholas [name], Nicholas [name]                    MRN: [id number] Proc. Date: 02/03/00 Adm. Date:  [id number] Attending:  Londell Moh                           Operative Report  PREOPERATIVE DIAGNOSES: 1. Left ureteral calculus. 2. Hydronephrosis. 3. Sepsis.  POSTOPERATIVE DIAGNOSES: 1. Left ureteral calculus. 2. Hydronephrosis. 3. Sepsis.  PROCEDURE:  Cystoscopy, left retrograde and left double-J catheter insertion.  SURGEON:  Jamison Neighbor, M.D.  ANESTHESIA:  General.  COMPLICATIONS:  None.  DRAINS:  A 7-French by 28 cm double-J catheter and a 16-French Foley catheter.  BRIEF HISTORY:  This is a 55 year old male who had passed a stone on one occasion.  The patient felt reasonably well until this morning when he began to develop pain in the left hand side.  He came to the emergency room for evaluation of left flank pain.  At the time he presented to the emergency room, he was afebrile, but during the evaluation his temperature went up to 102 degrees.  An IVP was obtained as the patient is too large to undergo a CAT scan.  This revealed an 8 x 12 mm proximal left ureteral catheter with marked hydronephrosis.  The patient was found on his laboratory studies to have a white count of 17.4 and a markedly abnormal urine.  For that reason, I felt that the patient needs emergency stent placement.  He understands the risks and benefits of the procedure, and informed consent was obtained.  He realizes that later on he will need therapy to take out the stone, but this is needed emergently to decompress the kidney.  DESCRIPTION OF PROCEDURE:  After the successful induction of general anesthesia, the patient was placed in the dorsolithotomy position and prepped with Betadine and draped in the usual sterile fashion.  Cystoscopy was performed and the urethra was visualized through its entirety and found to be normal beyond the  verumontanum.  There was minimal prostatic enlargement.  The bladder was carefully inspected.  It was free of any tumor or stones.  Both ureteral orifices were of normal configuration and location.  Attempt at passage of a left ureteral catheter was difficult as there was some angulation of the ureteral orifice.  A standard guidewire would not pass, but a glidewire could be passed up into the kidney.  The patient had a ureteral catheter advanced over this to the the kidney.  The patient was found to have a highly nephrotic kidney and what appeared to be a filling defect and distal to the stone at the UPJ.  It was very difficult to get a good image of this area. The patient weighs over 500 pounds.  The glidewire was removed and a snare guidewire was passed and allowed to coil up within the upper portion of the kidney.  A 7-French x 28 cm double-J catheter was passed over the wire.  It did appear to coil up in the upper pole and also in the bladder.  The bladder was drained.  The patient had a Foley catheter inserted for monitoring purposes.  The patient tolerated the procedure well and was taken to the recovery room in good condition. DD:  02/03/00 TD:  02/04/00 Job: [id number]

## 2010-11-14 NOTE — Op Note (Signed)
[facility]  Patient:    Nicholas [name], Nicholas [name]                    MRN: [id number] Proc. Date: 02/18/00 Adm. Date:  [id number] Attending:  Thermon Leyland                           Operative Report  PREOPERATIVE DIAGNOSES:  1. Left proximal ureteral calculus.  2. Left hydronephrosis.  3. History of urosepsis.  POSTOPERATIVE DIAGNOSES:  1. Left proximal ureteral calculus.  2. Left hydronephrosis.  3. History of urosepsis.  PROCEDURE:  Cystoscopy, retrograde pyelogram, removal of double J stent, rigid ureteroscopy, flexible ureteroscopy, holmium laser lithotripsy.  SURGEON:  Dr. Isabel Caprice.  ANESTHESIA:  General.  INDICATIONS FOR PROCEDURE:  Nicholas [name] is a 55 year old male. He is morbidly obese and weighs approximately 500 pounds. He was seen in my office as an outpatient for what appeared to be mild pyelonephritis as well as a question of a concurrent stone. He improved dramatically over several days and became completely asymptomatic and was doing quite well on follow-up. He later presented with apparent urosepsis to the [facility] Emergency Room. There an intervenous pyelogram revealed what appeared to be an 8-12 mm proximal left ureteral calculus. The patient was grossly septic. Dr. Logan Bores who was on-call placed a double J stent to decompress the system and the patient was kept for several days for intravenous antibiotics. He subsequently left the hospital and has done well. He presents now for definitive management of his stone.  TECHNIQUE AND FINDINGS:  The patient was brought to the operating room where he had successful induction of general anesthesia. He was placed in lithotomy position and prepped and draped in the usual manner. Cystoscopy revealed the stent to be in good position which was subsequently removed. Retrograde pyelogram appeared to show a filling defect but it was difficult to say because of the patients body habitus and  it was very difficult to interpret any images on the fluoro screen. A guidewire was able to be placed in the left renal pelvis without difficulty. We used a long rigid ureteroscope and were able to get up almost to the ureteral pelvic junction but were unable to visualize the stone. His anatomy precluded going further into the renal pelvis. For that reason, I went ahead and used the access sheath and then placed a flexible ureteroscope through that. Right in the most proximal aspect of the ureter just underneath the ureteral pelvic junction, an 8-10 mm yellow stone was encountered. Laser lithotripsy was used with the holmium fiber. The stone was very soft and fragmented very easily into many small pieces none larger than a couple millimeters. Quick endoscopy of the renal pelvis and caliceal system did not reveal any obvious additional stones although it would be difficult to say with certainty. Since the patient had an indwelling stent for 2 weeks and the ureteroscopy and holmium laser was so atraumatic, I did not feel that repeat stent placement was really necessary. For that reason, we simply removed the guidewire. I drained his bladder. Lidocaine jelly was installed and he was brought to the recovery room in stable condition. DD:  02/18/00 TD:  02/19/00 Job: [id number]

## 2010-11-19 ENCOUNTER — Encounter: Payer: Self-pay | Admitting: Family Medicine

## 2010-11-20 ENCOUNTER — Ambulatory Visit (INDEPENDENT_AMBULATORY_CARE_PROVIDER_SITE_OTHER): Payer: Medicare PPO | Admitting: Family Medicine

## 2010-11-20 ENCOUNTER — Encounter: Payer: Self-pay | Admitting: Family Medicine

## 2010-11-20 VITALS — BP 110/60 | HR 72 | Resp 16 | Ht 71.5 in | Wt >= 6400 oz

## 2010-11-20 DIAGNOSIS — R5381 Other malaise: Secondary | ICD-10-CM

## 2010-11-20 DIAGNOSIS — R05 Cough: Secondary | ICD-10-CM

## 2010-11-20 DIAGNOSIS — L219 Seborrheic dermatitis, unspecified: Secondary | ICD-10-CM | POA: Insufficient documentation

## 2010-11-20 DIAGNOSIS — I10 Essential (primary) hypertension: Secondary | ICD-10-CM

## 2010-11-20 DIAGNOSIS — E785 Hyperlipidemia, unspecified: Secondary | ICD-10-CM

## 2010-11-20 DIAGNOSIS — R5383 Other fatigue: Secondary | ICD-10-CM

## 2010-11-20 DIAGNOSIS — B369 Superficial mycosis, unspecified: Secondary | ICD-10-CM | POA: Insufficient documentation

## 2010-11-20 DIAGNOSIS — IMO0001 Reserved for inherently not codable concepts without codable children: Secondary | ICD-10-CM

## 2010-11-20 DIAGNOSIS — E119 Type 2 diabetes mellitus without complications: Secondary | ICD-10-CM

## 2010-11-20 DIAGNOSIS — R059 Cough, unspecified: Secondary | ICD-10-CM | POA: Insufficient documentation

## 2010-11-20 MED ORDER — DOXYCYCLINE HYCLATE 100 MG PO TABS: 100.0000 mg | ORAL_TABLET | Freq: Two times a day (BID) | ORAL | Status: AC

## 2010-11-20 MED ORDER — CLOTRIMAZOLE-BETAMETHASONE 1-0.05 % EX CREA: TOPICAL_CREAM | CUTANEOUS | Status: DC

## 2010-11-20 MED ORDER — BENZONATATE 100 MG PO CAPS: 100.0000 mg | ORAL_CAPSULE | Freq: Three times a day (TID) | ORAL | Status: DC | PRN

## 2010-11-20 MED ORDER — KETOCONAZOLE 2 % EX SHAM: MEDICATED_SHAMPOO | CUTANEOUS | Status: AC

## 2010-11-20 NOTE — Progress Notes (Signed)
  Subjective:    Patient ID: Nicholas [name], male    DOB: May 28, 1956, 55 y.o.   MRN: [id number]  HPI The PT is here for follow up and re-evaluation of chronic medical conditions, medication management and review of recent lab and radiology data.  Preventive health is updated, specifically  Cancer screening,  and Immunization.   Questions or concerns regarding consultations or procedures which the PT has had in the interim are  addressed. The PT denies any adverse reactions to current medications since the last visit.  There are  new concerns about his cough and skin condition.   Pt having pain management through dr Gerilyn Pilgrim and is happy with this.  He has been dx with carpal tunnel and seen ortho for this.  He has been treated for glaucoma for years.sees dr Nile Riggs.  C/o chest congestion espescially when he is swimming, chronic cough and chest congestion  Does not test sugars regularly,  C/o flaking red areas ovre eyebrows and on cheeks, no purulent drainage from same   Review of Systems Denies recent fever or chills. Denies sinus pressure, nasal congestion, ear pain or sore throat.  Denies chest pains, palpitations, paroxysmal nocturnal dyspnea, orthopnea and leg swelling Denies abdominal pain, nausea, vomiting,diarrhea or constipation.  D Chronic back  pain,and limitation in mobility. Denies headaches, seizure, numbness, or tingling. Denies depression, anxiety or insomnia.       Objective:   Physical Exam Patient alert and oriented and in no Cardiopulmonary distress.  HEENT: No facial asymmetry, EOMI, no sinus tenderness,   Neck supple Chest: decreased air entry, scattered crackles  CVS: S1, S2 no murmurs, no S3.  ABD: Soft non tender. .  Ext: No edema  MS:  ROM spine, shoulders, hips and knees.  Skin: Intact, hyperpigmentation of legs due to stasis, dermamtitis with erythematous macular rash on forehead and cheeks.  Psych: Good eye contact, normal affect.  Memory intact not anxious or depressed appearing.  CNS: CN 2-12 intact, power, tone and sensation normal throughout.        Assessment & Plan:

## 2010-11-20 NOTE — Assessment & Plan Note (Signed)
deteriorated 

## 2010-11-20 NOTE — Patient Instructions (Signed)
F/u in 4 months.  Fasting labs are past due, pls get asap.   You will get med sent in for dandruff on your scalp, you can compare the cost with NIZORAL shampoo which is over the counter.   Cream is prescribed to be applied sparingly twice daily over the flaking areas of your eyebrows  For 1 week then as needed.  Medication for the cough is prescribed, and a 1 week course of antibiotics , both for the cough and the rash on your face

## 2010-11-24 NOTE — Assessment & Plan Note (Signed)
Pt to have labs asap, to determine control the importance of testing is stressed. He is considering changing to local endo

## 2010-11-24 NOTE — Assessment & Plan Note (Signed)
Controlled, no change in medication  

## 2010-11-24 NOTE — Assessment & Plan Note (Signed)
New diagnosis and severe, med to be started, will call for derm eval if no improvement

## 2010-11-24 NOTE — Assessment & Plan Note (Signed)
Improved, pt to continue med on as needed basis

## 2010-11-24 NOTE — Assessment & Plan Note (Signed)
Deteriorated. Patient re-educated about  the importance of commitment to a  minimum of 150 minutes of exercise per week. The importance of healthy food choices with portion control discussed. Encouraged to start a food diary, count calories and to consider  joining a support group. Sample diet sheets offered. Goals set by the patient for the next several months.    

## 2010-12-01 ENCOUNTER — Ambulatory Visit: Payer: Self-pay | Admitting: Family Medicine

## 2010-12-04 LAB — HEMOGLOBIN A1C
Hgb A1c MFr Bld: 6.2 % — ABNORMAL HIGH (ref <5.7)
Mean Plasma Glucose: 131 mg/dL — ABNORMAL HIGH (ref <117)

## 2010-12-04 LAB — LIPID PANEL: HDL: 40 mg/dL (ref >39)

## 2010-12-05 LAB — CBC WITH DIFFERENTIAL/PLATELET
Hemoglobin: 13.4 g/dL (ref 13.0–17.0)
Lymphocytes Relative: 20 % (ref 12–46)
Lymphs Abs: 1.7 10*3/uL (ref 0.7–4.0)
MCH: 27.9 pg (ref 26.0–34.0)
Monocytes Relative: 16 % — ABNORMAL HIGH (ref 3–12)
Neutro Abs: 5 10*3/uL (ref 1.7–7.7)
Neutrophils Relative %: 60 % (ref 43–77)
RBC: 4.8 MIL/uL (ref 4.22–5.81)

## 2010-12-05 LAB — COMPLETE METABOLIC PANEL WITH GFR
CO2: 28 mEq/L (ref 19–32)
GFR, Est African American: >60 mL/min (ref >60)
GFR, Est Non African American: >60 mL/min (ref >60)
Glucose, Bld: 177 mg/dL — ABNORMAL HIGH (ref 70–99)
Sodium: 141 mEq/L (ref 135–145)
Total Bilirubin: 0.8 mg/dL (ref 0.3–1.2)
Total Protein: 6.4 g/dL (ref 6.0–8.3)

## 2010-12-10 ENCOUNTER — Other Ambulatory Visit: Payer: Self-pay | Admitting: Family Medicine

## 2010-12-15 ENCOUNTER — Other Ambulatory Visit: Payer: Self-pay | Admitting: Family Medicine

## 2010-12-22 ENCOUNTER — Other Ambulatory Visit: Payer: Self-pay | Admitting: Family Medicine

## 2011-01-20 ENCOUNTER — Other Ambulatory Visit: Payer: Self-pay | Admitting: Family Medicine

## 2011-02-25 ENCOUNTER — Encounter: Payer: Self-pay | Admitting: Family Medicine

## 2011-02-26 ENCOUNTER — Ambulatory Visit (INDEPENDENT_AMBULATORY_CARE_PROVIDER_SITE_OTHER): Payer: Medicare PPO | Admitting: Family Medicine

## 2011-02-26 ENCOUNTER — Encounter: Payer: Self-pay | Admitting: Family Medicine

## 2011-02-26 VITALS — BP 90/50 | HR 69 | Resp 16 | Ht 71.5 in | Wt >= 6400 oz

## 2011-02-26 DIAGNOSIS — Z13820 Encounter for screening for osteoporosis: Secondary | ICD-10-CM

## 2011-02-26 DIAGNOSIS — R05 Cough: Secondary | ICD-10-CM

## 2011-02-26 DIAGNOSIS — E785 Hyperlipidemia, unspecified: Secondary | ICD-10-CM

## 2011-02-26 DIAGNOSIS — E119 Type 2 diabetes mellitus without complications: Secondary | ICD-10-CM

## 2011-02-26 DIAGNOSIS — R5381 Other malaise: Secondary | ICD-10-CM

## 2011-02-26 DIAGNOSIS — R059 Cough, unspecified: Secondary | ICD-10-CM

## 2011-02-26 DIAGNOSIS — R5383 Other fatigue: Secondary | ICD-10-CM

## 2011-02-26 DIAGNOSIS — L219 Seborrheic dermatitis, unspecified: Secondary | ICD-10-CM

## 2011-02-26 DIAGNOSIS — I10 Essential (primary) hypertension: Secondary | ICD-10-CM

## 2011-02-26 DIAGNOSIS — E291 Testicular hypofunction: Secondary | ICD-10-CM

## 2011-02-26 DIAGNOSIS — F341 Dysthymic disorder: Secondary | ICD-10-CM

## 2011-02-26 MED ORDER — DOXYCYCLINE HYCLATE 100 MG PO TABS: 100.0000 mg | ORAL_TABLET | Freq: Two times a day (BID) | ORAL | Status: AC

## 2011-02-26 MED ORDER — BENZONATATE 100 MG PO CAPS: 100.0000 mg | ORAL_CAPSULE | Freq: Three times a day (TID) | ORAL | Status: DC | PRN

## 2011-02-26 NOTE — Patient Instructions (Addendum)
F/u in 3.5 months.  Labs today and fasting labs before next visit  LABWORK  NEEDS TO BE DONE BETWEEN 3 TO 7 DAYS BEFORE YOUR NEXT SCEDULED  VISIT.  THIS WILL IMPROVE THE QUALITY OF YOUR CARE.   You are referred to local diabetes doc.  Med is sent for the congestion and cough  Continue to exercise regularly

## 2011-02-26 NOTE — Progress Notes (Signed)
  Subjective:    Patient ID: Nicholas [name], male    DOB: [birth year], 55 y.o.   MRN: [id number]  HPI Continues  To feel as though something is stuck in his throat, and when he brings it up it looks yellow. No fever or chills. Still has had no sleep study.Recently signed up to enter the bariatric program for weight loss. Reports good blood sugar readings, denies polyuria, polydipsia, blurred vision or hypoglycemic episodes.Tests blood sugars daily.    Review of Systems See HPI Denies recent fever or chills. Denies sinus pressure, nasal congestion, ear pain or sore throat.  Denies chest pains, palpitations and leg swelling Denies abdominal pain, nausea, vomiting,diarrhea or constipation.   Denies dysuria, frequency, hesitancy or incontinence. Denies joint pain, swelling and limitation in mobility. Denies headaches, seizures, numbness, or tingling. Denies depression, anxiety or insomnia. Denies skin break down or rash.        Objective:   Physical Exam Patient alert and oriented and in no cardiopulmonary distress.  HEENT: No facial asymmetry, EOMI, no sinus tenderness,  oropharynx pink and moist.  Neck supple no adenopathy.  Chest:decreased ai entry scattered crackles  CVS: S1, S2 no murmurs, no S3.  ABD: obese non tender. Bowel sounds normal.  Ext: No edema  MS: decreased  ROM spine, shoulders, hips and knees.  Skin: Intact, no ulcerations or rash noted.  Psych: Good eye contact, normal affect. Memory intact not anxious or depressed appearing.  CNS: CN 2-12 intact, power, tone and sensation normal throughout.        Assessment & Plan:

## 2011-02-27 LAB — TESTOSTERONE: Testosterone: 41.71 ng/dL — ABNORMAL LOW (ref 250–890)

## 2011-02-27 LAB — COMPLETE METABOLIC PANEL WITH GFR
Alkaline Phosphatase: 70 U/L (ref 39–117)
BUN: 18 mg/dL (ref 6–23)
GFR, Est African American: >60 mL/min (ref >60)
Glucose, Bld: 76 mg/dL (ref 70–99)
Total Bilirubin: 0.3 mg/dL (ref 0.3–1.2)

## 2011-02-27 LAB — HEMOGLOBIN A1C
Hgb A1c MFr Bld: 6.1 % — ABNORMAL HIGH (ref <5.7)
Mean Plasma Glucose: 128 mg/dL — ABNORMAL HIGH (ref <117)

## 2011-02-27 LAB — TSH: TSH: 3.096 u[IU]/mL (ref 0.350–4.500)

## 2011-03-01 LAB — VITAMIN D 1,25 DIHYDROXY: Vitamin D 1, 25 (OH)2 Total: 29 pg/mL (ref 18–72)

## 2011-03-09 ENCOUNTER — Other Ambulatory Visit: Payer: Self-pay | Admitting: Family Medicine

## 2011-03-14 ENCOUNTER — Other Ambulatory Visit: Payer: Self-pay | Admitting: Family Medicine

## 2011-03-24 ENCOUNTER — Telehealth: Payer: Self-pay | Admitting: Family Medicine

## 2011-03-24 ENCOUNTER — Ambulatory Visit: Payer: Medicare PPO | Admitting: Family Medicine

## 2011-03-24 NOTE — Telephone Encounter (Signed)
Patient has been constipated for 5 days now. No pain or swelling in abdomen just feels like something needs to come out but can't. Has tried Exlax, Milk of Magnesia, mag citrate and miralax with no BM. Wants to know if there is something stronger (RX) that he can get? Nicholas [name]

## 2011-03-24 NOTE — Telephone Encounter (Signed)
Called patient left message

## 2011-03-25 ENCOUNTER — Telehealth: Payer: Self-pay | Admitting: Family Medicine

## 2011-03-25 LAB — BASIC METABOLIC PANEL
BUN: 19 mg/dL (ref 6–23)
CO2: 29 mEq/L (ref 19–32)
Chloride: 104 mEq/L (ref 96–112)
Potassium: 5.6 mEq/L — ABNORMAL HIGH (ref 3.5–5.3)

## 2011-03-25 LAB — LIPID PANEL
LDL Cholesterol: 56 mg/dL (ref 0–99)
Triglycerides: 105 mg/dL (ref <150)
VLDL: 21 mg/dL (ref 0–40)

## 2011-03-25 LAB — HEPATIC FUNCTION PANEL
Albumin: 4.2 g/dL (ref 3.5–5.2)
Indirect Bilirubin: 0.3 mg/dL (ref 0.0–0.9)
Total Bilirubin: 0.4 mg/dL (ref 0.3–1.2)
Total Protein: 6.6 g/dL (ref 6.0–8.3)

## 2011-03-25 MED ORDER — MILK AND MOLASSES ENEMA: 1.0000 | Freq: Once | RECTAL | Status: DC

## 2011-03-25 NOTE — Telephone Encounter (Signed)
Sent in and patient aware 

## 2011-03-25 NOTE — Telephone Encounter (Signed)
pls contact [facility] call in milk of molasses enema x 1 let pt know if this does not work he needs to go to the ED

## 2011-03-28 NOTE — Assessment & Plan Note (Signed)
Hyperlipidemia:Low fat diet discussed and encouraged.  F/u lab shows improvement though HDL is low, as activity increases this will improve

## 2011-03-28 NOTE — Assessment & Plan Note (Signed)
Improved. Pt applauded on succesful weight loss through lifestyle change, and encouraged to continue same. Weight loss goal set for the next several months.  

## 2011-03-28 NOTE — Assessment & Plan Note (Signed)
Currently controlled, but will follow with local endo moving forward

## 2011-03-28 NOTE — Assessment & Plan Note (Signed)
Will check testosterone level, may need referral to urology

## 2011-03-28 NOTE — Assessment & Plan Note (Signed)
Controlled, no change in medication  

## 2011-03-29 ENCOUNTER — Other Ambulatory Visit: Payer: Self-pay | Admitting: *Deleted

## 2011-03-29 MED ORDER — SIMVASTATIN 40 MG PO TABS: 40.0000 mg | ORAL_TABLET | Freq: Every day | ORAL | Status: DC

## 2011-03-29 NOTE — Assessment & Plan Note (Signed)
Improved

## 2011-03-29 NOTE — Assessment & Plan Note (Signed)
Controlled, no change in medication  

## 2011-03-29 NOTE — Assessment & Plan Note (Signed)
Tessalon perles and doxycycline prescribed 

## 2011-03-30 ENCOUNTER — Other Ambulatory Visit: Payer: Self-pay | Admitting: Family Medicine

## 2011-03-31 LAB — GLUCOSE, CAPILLARY: Glucose-Capillary: 125 — ABNORMAL HIGH

## 2011-04-20 ENCOUNTER — Encounter: Payer: Self-pay | Admitting: Family Medicine

## 2011-04-20 ENCOUNTER — Ambulatory Visit (INDEPENDENT_AMBULATORY_CARE_PROVIDER_SITE_OTHER): Payer: Medicare PPO | Admitting: Family Medicine

## 2011-04-20 VITALS — BP 110/70 | HR 82 | Resp 16 | Ht 71.5 in | Wt >= 6400 oz

## 2011-04-20 DIAGNOSIS — I10 Essential (primary) hypertension: Secondary | ICD-10-CM

## 2011-04-20 DIAGNOSIS — E785 Hyperlipidemia, unspecified: Secondary | ICD-10-CM

## 2011-04-20 DIAGNOSIS — E119 Type 2 diabetes mellitus without complications: Secondary | ICD-10-CM

## 2011-04-20 DIAGNOSIS — E669 Obesity, unspecified: Secondary | ICD-10-CM

## 2011-04-20 DIAGNOSIS — E291 Testicular hypofunction: Secondary | ICD-10-CM

## 2011-04-20 DIAGNOSIS — M549 Dorsalgia, unspecified: Secondary | ICD-10-CM

## 2011-04-20 MED ORDER — PREDNISONE (PAK) 5 MG PO TABS: 5.0000 mg | ORAL_TABLET | ORAL | Status: AC

## 2011-04-20 MED ORDER — KETOROLAC TROMETHAMINE 60 MG/2ML IM SOLN
60.0000 mg | Freq: Once | INTRAMUSCULAR | Status: AC
Start: 2011-04-20 — End: 2011-04-20
  Administered 2011-04-20: 60 mg via INTRAMUSCULAR

## 2011-04-20 MED ORDER — RANITIDINE HCL 150 MG PO TABS: 150.0000 mg | ORAL_TABLET | Freq: Two times a day (BID) | ORAL | Status: DC

## 2011-04-20 MED ORDER — CYCLOBENZAPRINE HCL 10 MG PO TABS: 10.0000 mg | ORAL_TABLET | Freq: Three times a day (TID) | ORAL | Status: DC | PRN

## 2011-04-20 NOTE — Assessment & Plan Note (Signed)
Toradol administered, anti inflammatories and muscle relaxants prescribed

## 2011-04-20 NOTE — Patient Instructions (Signed)
F/u in 4 to 6 weeks. You are being treated for acute back pain with sciiatica.  Toradol injection in the office today  3 meds are sent to your pharmacy.  It is important you take the testosterone supplement every day.  Rept testosterone level early morning in 6 weeks before next visit

## 2011-04-20 NOTE — Progress Notes (Signed)
  Subjective:    Patient ID: Nicholas [name], male    DOB: May 29, 1956, 55 y.o.   MRN: [id number]  HPI 10 day h/o back pain radiating down left posterior thigh , started when he twisted sharply in the pool. Has had in the past years ago.Denies lower ext weakness or numbness, or incontinence of stool or urine No other concerns that are new.   Review of Systems  Denies recent fever or chills. Denies sinus pressure, nasal congestion, ear pain or sore throat. Denies chest congestion, productive cough or wheezing. Denies chest pains, palpitations and leg swelling Denies abdominal pain, nausea, vomiting,diarrhea or constipation.   Denies dysuria, frequency, hesitancy or incontinence. Denies headaches, seizures, numbness, or tingling. Denies uncontrolled  depression, anxiety or insomnia. Denies skin break down or rash. Denies polyuria, polydipsia , blurred  vision or hypoglycemic episodes. He tests regularly and is followed by endo        Objective:   Physical Exam  Patient alert and oriented and in no cardiopulmonary distress.  HEENT: No facial asymmetry, EOMI, no sinus tenderness,  oropharynx pink and moist.  Neck supple no adenopathy.  Chest: Clear to auscultation bilaterally.  CVS: S1, S2 no murmurs, no S3.  ABD: Soft non tender. Bowel sounds normal.  Ext: No edema  [id number]  ROM spine tender over low back and SI joint, shoulders, hips and knees.  Skin: Intact, no ulcerations or rash noted.  Psych: Good eye contact, normal affect. Memory intact not anxious or depressed appearing.  CNS: CN 2-12 intact, power, tone and sensation normal throughout.       Assessment & Plan:

## 2011-04-29 ENCOUNTER — Telehealth: Payer: Self-pay | Admitting: Family Medicine

## 2011-05-01 ENCOUNTER — Other Ambulatory Visit: Payer: Self-pay | Admitting: Family Medicine

## 2011-05-01 ENCOUNTER — Telehealth: Payer: Self-pay | Admitting: Family Medicine

## 2011-05-01 MED ORDER — TRAMADOL HCL 50 MG PO TABS: 50.0000 mg | ORAL_TABLET | Freq: Four times a day (QID) | ORAL | Status: AC | PRN

## 2011-05-01 NOTE — Telephone Encounter (Signed)
I refilled the tramadol, if he needs more than that needs OV next week with available provider, when available

## 2011-05-01 NOTE — Telephone Encounter (Signed)
Advise short course tramadol sent in, and to be careful

## 2011-05-01 NOTE — Telephone Encounter (Signed)
Patient aware. Appt for monday

## 2011-05-01 NOTE — Telephone Encounter (Signed)
Called patient, unavailable.

## 2011-05-01 NOTE — Telephone Encounter (Signed)
Can he have something called in or does he need OV?

## 2011-05-03 NOTE — Assessment & Plan Note (Signed)
Hyperlipidemia:Low fat diet discussed and encouraged.  Controlled, no change in medication   

## 2011-05-03 NOTE — Assessment & Plan Note (Signed)
Unchanged. Patient re-educated about  the importance of commitment to a  minimum of 150 minutes of exercise per week. The importance of healthy food choices with portion control discussed. Encouraged to start a food diary, count calories and to consider  joining a support group. Sample diet sheets offered. Goals set by the patient for the next several months.    

## 2011-05-03 NOTE — Assessment & Plan Note (Signed)
Controlled and followed by endo 

## 2011-05-03 NOTE — Assessment & Plan Note (Signed)
Controlled, no change in medication  

## 2011-05-04 ENCOUNTER — Encounter: Payer: Self-pay | Admitting: Family Medicine

## 2011-05-04 ENCOUNTER — Ambulatory Visit (INDEPENDENT_AMBULATORY_CARE_PROVIDER_SITE_OTHER): Payer: Medicare Other | Admitting: Family Medicine

## 2011-05-04 VITALS — BP 114/74 | HR 101 | Resp 16 | Ht 71.5 in | Wt >= 6400 oz

## 2011-05-04 DIAGNOSIS — M549 Dorsalgia, unspecified: Secondary | ICD-10-CM

## 2011-05-04 DIAGNOSIS — E785 Hyperlipidemia, unspecified: Secondary | ICD-10-CM

## 2011-05-04 DIAGNOSIS — E119 Type 2 diabetes mellitus without complications: Secondary | ICD-10-CM

## 2011-05-04 DIAGNOSIS — E291 Testicular hypofunction: Secondary | ICD-10-CM

## 2011-05-04 DIAGNOSIS — I10 Essential (primary) hypertension: Secondary | ICD-10-CM

## 2011-05-04 MED ORDER — PREDNISONE (PAK) 5 MG PO TABS: 5.0000 mg | ORAL_TABLET | ORAL | Status: AC

## 2011-05-04 MED ORDER — KETOROLAC TROMETHAMINE 60 MG/2ML IM SOLN
60.0000 mg | Freq: Once | INTRAMUSCULAR | Status: AC
  Administered 2011-05-04: 60 mg via INTRAMUSCULAR

## 2011-05-04 NOTE — Telephone Encounter (Signed)
PATIENT WAS SEEN IN OFFICE TODAY 

## 2011-05-04 NOTE — Assessment & Plan Note (Signed)
Deteriorated. Toradol 60mg  im in offioce also pred dose pack , pt may take one extra tramadol 50mg  tab totalling 4 daily for the next week, will send a not to pain doc also

## 2011-05-04 NOTE — Assessment & Plan Note (Signed)
Controlled, no change in medication  

## 2011-05-04 NOTE — Patient Instructions (Addendum)
F/u in 3 months, cancel sooner appt please  Toradol injection today for pain.  Start prednisone dose pack tomorrow  Take a maximum of 1 extra tramadol tablet at bedtime, making 4 total per day (50 mg tablets) for the next 1 week only if needed  A healthy diet is rich in fruit, vegetables and whole grains. Poultry fish, nuts and beans are a healthy choice for protein rather then red meat. A low sodium diet and drinking 64 ounces of water daily is generally recommended. Oils and sweet should be limited. Carbohydrates especially for those who are diabetic or overweight, should be limited to 30-45 gram per meal. It is important to eat on a regular schedule, at least 3 times daily. Snacks should be primarily fruits, vegetables or nuts.

## 2011-05-04 NOTE — Progress Notes (Signed)
  Subjective:    Patient ID: Nicholas [name], male    DOB: 04-21-1956, 55 y.o.   MRN: [id number]  HPI Pt reports tripping over his dog accidentally on 04/25/2011. Since that time he has called in reporting that he has increased back pain.I refilled tramadol last week  Which had been prescribed  By his pain doc with whom he has a contract, This was done in accident and I will need to contact the prescribing doc. C/o spasm of left inner thigh, denies incontinence of stool or urine, also denies lower extremity weakness or new  numbness Pt also requesting diabetic shoes   Review of Systems See HPI Denies recent fever or chills. Denies sinus pressure, nasal congestion, ear pain or sore throat. Denies chest congestion, productive cough or wheezing. Denies chest pains, palpitations and leg swelling Denies abdominal pain, nausea, vomiting,diarrhea or constipation.   Denies dysuria, frequency, hesitancy or incontinence. Denies headaches, seizures,  or tingling. Denies depression, anxiety or insomnia. Denies skin break down or rash.        Objective:   Physical Exam Patient alert and oriented and in no cardiopulmonary distress.Morbidly obese  HEENT: No facial asymmetry, EOMI, no sinus tenderness,  oropharynx pink and moist.  Neck supple no adenopathy.  Chest: Clear to auscultation bilaterally.  CVS: S1, S2 no murmurs, no S3.Decreased dorsalis pedis bilaterally with cool extremities  ABD: Soft non tender. Bowel sounds normal.  Ext: No edema  MS: decreased  ROM spine,tender over left SI joint, shoulders, hips and knees.  Skin: Intact, tinea pedis and onychomycosis  Psych: Good eye contact, normal affect. Memory intact not anxious or depressed appearing.  CNS: CN 2-12 intact, power, normal throughout.  Diabetic Foot Check:  Appearance -calluses, bilateral onychomycosis and tinea pedis Skin -calluses and severe fungal infection bilateraslly Sensation - grossly intact to light  touch Monofilament testing -  Right - Great toe, medial, central, lateral ball and posterior foot severely diminished Left - Great toe, medial, central, lateral ball and posterior foot intact severely diminished Pulses Left - Dorsalis Pedis and Posterior Tibia  diminished Right - Dorsalis Pedis and Posterior Tibia diminished      Assessment & Plan:

## 2011-05-04 NOTE — Assessment & Plan Note (Signed)
Controlled, no change in medication Request for diabetic shoes granted based on exam on day of visit, script provided, Pt to continue to follow with endo

## 2011-05-04 NOTE — Assessment & Plan Note (Signed)
Deteriorated. Patient re-educated about  the importance of commitment to a  minimum of 150 minutes of exercise per week. The importance of healthy food choices with portion control discussed. Encouraged to start a food diary, count calories and to consider  joining a support group. Sample diet sheets offered. Goals set by the patient for the next several months.    

## 2011-05-09 ENCOUNTER — Other Ambulatory Visit: Payer: Self-pay | Admitting: Family Medicine

## 2011-05-10 NOTE — Assessment & Plan Note (Signed)
Hyperlipidemia:Low fat diet discussed and encouraged.  Continue current med

## 2011-05-10 NOTE — Assessment & Plan Note (Signed)
Continue supplemental testosterone

## 2011-05-25 ENCOUNTER — Other Ambulatory Visit: Payer: Self-pay | Admitting: Family Medicine

## 2011-05-29 ENCOUNTER — Other Ambulatory Visit: Payer: Self-pay | Admitting: Family Medicine

## 2011-06-04 ENCOUNTER — Ambulatory Visit: Payer: Medicare PPO | Admitting: Family Medicine

## 2011-07-03 ENCOUNTER — Telehealth: Payer: Self-pay | Admitting: Family Medicine

## 2011-07-03 NOTE — Telephone Encounter (Signed)
Pt states the he needs hospital bed because he needs to be able to elevate his feet.  He states that he is not able to sleep in a regular bed because of his weight and when he lays flat he is unable to breathe.

## 2011-07-03 NOTE — Telephone Encounter (Signed)
Called and left message for pt to return call.  

## 2011-07-06 NOTE — Telephone Encounter (Signed)
rx written pls fax and let him know

## 2011-07-07 ENCOUNTER — Ambulatory Visit: Payer: Medicare PPO | Admitting: Family Medicine

## 2011-07-07 NOTE — Telephone Encounter (Signed)
Order faxed and patient aware.

## 2011-07-10 ENCOUNTER — Other Ambulatory Visit: Payer: Self-pay | Admitting: Family Medicine

## 2011-07-23 ENCOUNTER — Other Ambulatory Visit: Payer: Self-pay | Admitting: Family Medicine

## 2011-08-14 ENCOUNTER — Other Ambulatory Visit: Payer: Self-pay | Admitting: Family Medicine

## 2011-08-18 ENCOUNTER — Ambulatory Visit (INDEPENDENT_AMBULATORY_CARE_PROVIDER_SITE_OTHER): Payer: Medicare Other | Admitting: Family Medicine

## 2011-08-18 ENCOUNTER — Encounter: Payer: Self-pay | Admitting: Family Medicine

## 2011-08-18 DIAGNOSIS — G8929 Other chronic pain: Secondary | ICD-10-CM

## 2011-08-18 DIAGNOSIS — E119 Type 2 diabetes mellitus without complications: Secondary | ICD-10-CM

## 2011-08-18 DIAGNOSIS — F341 Dysthymic disorder: Secondary | ICD-10-CM

## 2011-08-18 DIAGNOSIS — E785 Hyperlipidemia, unspecified: Secondary | ICD-10-CM

## 2011-08-18 DIAGNOSIS — E291 Testicular hypofunction: Secondary | ICD-10-CM

## 2011-08-18 DIAGNOSIS — I10 Essential (primary) hypertension: Secondary | ICD-10-CM

## 2011-08-18 NOTE — Patient Instructions (Signed)
F/u in 6 month  It is important that you exercise regularly at least 30 minutes 5 times a week. If you develop chest pain, have severe difficulty breathing, or feel very tired, stop exercising immediately and seek medical attention    A healthy diet is rich in fruit, vegetables and whole grains. Poultry fish, nuts and beans are a healthy choice for protein rather then red meat. A low sodium diet and drinking 64 ounces of water daily is generally recommended. Oils and sweet should be limited. Carbohydrates especially for those who are diabetic or overweight, should be limited to 34-45 gram per meal. It is important to eat on a regular schedule, at least 3 times daily. Snacks should be primarily fruits, vegetables or nuts.   Weight loss goal of 12 to 18 pounds.  No med changes  Fasting lipid, cmp and EGFR in 51month and 3 weeks

## 2011-08-18 NOTE — Progress Notes (Signed)
  Subjective:    Patient ID: Nicholas [name], male    DOB: 1955-12-06, 56 y.o.   MRN: [id number]  HPI 1 month h/o right upper ext tremor, sees dr Gerilyn Pilgrim in  2 weeks who will further address this. He sees him regularly for chronic pain management. Pt reports good blood sugar control, denies hypoglycemic episodes, follows with local endocrinologist. Denies uncontrolled depression, suicidal orhomicidal ideation. Has had no recent fever or chills   Review of Systems See HPI Denies recent fever or chills. Denies sinus pressure, nasal congestion, ear pain or sore throat. Denies chest congestion, productive cough or wheezing. Denies chest pains, palpitations and leg swelling Denies abdominal pain, nausea, vomiting,diarrhea or constipation.   Denies dysuria, frequency, hesitancy or incontinence. Chronic back pain and limitation n mobility Denies headaches, seizures, numbness, or tingling. Denies uncontrolled  depression, anxiety or insomnia. Denies skin break down or rash.        Objective:   Physical Exam Patient alert and oriented and in no cardiopulmonary distress.  HEENT: No facial asymmetry, EOMI, no sinus tenderness,  oropharynx pink and moist.  Neck supple no adenopathy.  Chest: Clear to auscultation bilaterally.  CVS: S1, S2 no murmurs, no S3.  ABD: Soft non tender. Bowel sounds normal.  Ext: No edema  [id number]  ROM spine, shoulders, hips and knees.  Skin: Intact, no ulcerations or rash noted.  Psych: Good eye contact, normal affect. Memory intact not anxious or depressed appearing.  CNS: CN 2-12 intact, power, tone and sensation normal throughout.        Assessment & Plan:

## 2011-09-06 ENCOUNTER — Encounter: Payer: Self-pay | Admitting: Family Medicine

## 2011-09-06 NOTE — Assessment & Plan Note (Signed)
Controlled, no change in medication  

## 2011-09-06 NOTE — Assessment & Plan Note (Signed)
Followed by endo, controlled 

## 2011-09-06 NOTE — Assessment & Plan Note (Signed)
Continue pain management as before 

## 2011-09-06 NOTE — Assessment & Plan Note (Signed)
Continue androgel as before

## 2011-09-06 NOTE — Assessment & Plan Note (Signed)
Hyperlipidemia:Low fat diet discussed and encouraged.  Controlled, no change in medication Need updated profile at next visit

## 2011-09-06 NOTE — Assessment & Plan Note (Signed)
Improved. Pt applauded on succesful weight loss through lifestyle change, and encouraged to continue same. Weight loss goal set for the next several months.  

## 2011-09-10 ENCOUNTER — Other Ambulatory Visit: Payer: Self-pay | Admitting: Family Medicine

## 2011-09-29 ENCOUNTER — Other Ambulatory Visit: Payer: Self-pay | Admitting: Family Medicine

## 2011-10-15 ENCOUNTER — Other Ambulatory Visit: Payer: Self-pay | Admitting: Family Medicine

## 2011-10-29 ENCOUNTER — Other Ambulatory Visit: Payer: Self-pay | Admitting: Family Medicine

## 2011-11-09 ENCOUNTER — Other Ambulatory Visit: Payer: Self-pay | Admitting: Family Medicine

## 2011-11-24 ENCOUNTER — Ambulatory Visit (INDEPENDENT_AMBULATORY_CARE_PROVIDER_SITE_OTHER): Payer: Medicare Other | Admitting: Family Medicine

## 2011-11-24 ENCOUNTER — Encounter: Payer: Self-pay | Admitting: Family Medicine

## 2011-11-24 VITALS — BP 142/84 | HR 84 | Resp 18 | Ht 71.5 in | Wt >= 6400 oz

## 2011-11-24 DIAGNOSIS — E119 Type 2 diabetes mellitus without complications: Secondary | ICD-10-CM

## 2011-11-24 DIAGNOSIS — I10 Essential (primary) hypertension: Secondary | ICD-10-CM

## 2011-11-24 DIAGNOSIS — L97929 Non-pressure chronic ulcer of unspecified part of left lower leg with unspecified severity: Secondary | ICD-10-CM | POA: Insufficient documentation

## 2011-11-24 DIAGNOSIS — F341 Dysthymic disorder: Secondary | ICD-10-CM

## 2011-11-24 DIAGNOSIS — Z23 Encounter for immunization: Secondary | ICD-10-CM

## 2011-11-24 DIAGNOSIS — L97909 Non-pressure chronic ulcer of unspecified part of unspecified lower leg with unspecified severity: Secondary | ICD-10-CM

## 2011-11-24 LAB — BASIC METABOLIC PANEL
CO2: 26 mEq/L (ref 19–32)
Calcium: 9.5 mg/dL (ref 8.4–10.5)
Chloride: 106 mEq/L (ref 96–112)
Glucose, Bld: 131 mg/dL — ABNORMAL HIGH (ref 70–99)
Potassium: 5.8 mEq/L — ABNORMAL HIGH (ref 3.5–5.3)
Sodium: 139 mEq/L (ref 135–145)

## 2011-11-24 LAB — CBC WITH DIFFERENTIAL/PLATELET
Eosinophils Absolute: 0.4 10*3/uL (ref 0.0–0.7)
Hemoglobin: 13.8 g/dL (ref 13.0–17.0)
Lymphocytes Relative: 18 % (ref 12–46)
Lymphs Abs: 2 10*3/uL (ref 0.7–4.0)
Monocytes Relative: 7 % (ref 3–12)
Neutro Abs: 7.9 10*3/uL — ABNORMAL HIGH (ref 1.7–7.7)
Neutrophils Relative %: 72 % (ref 43–77)
Platelets: 310 10*3/uL (ref 150–400)
RBC: 4.67 MIL/uL (ref 4.22–5.81)
WBC: 11.2 10*3/uL — ABNORMAL HIGH (ref 4.0–10.5)

## 2011-11-24 MED ORDER — CEFTRIAXONE SODIUM 1 G IJ SOLR
500.0000 mg | Freq: Once | INTRAMUSCULAR | Status: AC
  Administered 2011-11-24: 500 mg via INTRAMUSCULAR

## 2011-11-24 MED ORDER — DOXYCYCLINE HYCLATE 100 MG PO TABS: 100.0000 mg | ORAL_TABLET | Freq: Two times a day (BID) | ORAL | Status: AC

## 2011-11-24 NOTE — Progress Notes (Signed)
Wound cleaned with normal saline. Dressing applied to left leg.  No adherent dressing with no tape.

## 2011-11-24 NOTE — Progress Notes (Signed)
  Subjective:    Patient ID: Nicholas [name], male    DOB: 1956-03-14, 56 y.o.   MRN: [id number]  HPI Pt in with a 2 week h/o worsening ulcer on the left  leg. States he accidentally skinned the area, has tried to nurse it back to complete healing but instead of improving it is progressively worsening , and he is "scared as hell" Blood sugar control is not as good as he would like and it has been, he continues to follow, since current ulcer this has worsened with fasting sugars averaging 140 approximately     Review of Systems  See HPI Denies recent fever or chills. Denies sinus pressure, nasal congestion, ear pain or sore throat. Denies chest congestion, productive cough or wheezing. Denies chest pains, palpitations and leg swelling Denies abdominal pain, nausea, vomiting,diarrhea or constipation.   Denies dysuria, frequency, hesitancy or incontinence. Chronic  joint pain,  and limitation in mobility. Denies headaches, seizures, numbness, or tingling.      Objective:   Physical Exam Patient alert and oriented and in no cardiopulmonary distress.Anxious  HEENT: No facial asymmetry, EOMI, no sinus tenderness,  oropharynx pink and moist.  Neck supple no adenopathy.  Chest: Clear to auscultation bilaterally.Decreased air entry bilateraly  CVS: S1, S2 no murmurs, no S3.  ABD: Soft non tender. Bowel sounds normal.  Ext: No edema  MS: decreased  ROM spine, shoulders, hips and knees.  Skin: stage 2 ulcer on anterior left  leg, maximal diameter approximately 8 inches. No visible purulent drainage.  Psych: Good eye contact, normal affect. Memory intact  anxious but not depressed appearing.  CNS: CN 2-12 intact, power,  normal throughout.        Assessment & Plan:

## 2011-11-24 NOTE — Patient Instructions (Signed)
F/u in 6 weeks.  You will get TdaP and Ropcephin 500mg  IM in office today. Antibiotics are also sent to your pharmacy  Dressing will be loosely applied over ulcer in office after cleansing with normal saline  You are refered to wound clinic in [facility] for management of left leg ulcer till healed  CBC and diff today and chem 7

## 2011-11-30 NOTE — Assessment & Plan Note (Signed)
Followed  by endo, control not as good in the past based on most recent labs

## 2011-11-30 NOTE — Assessment & Plan Note (Signed)
Controlled, no change in medication  

## 2011-11-30 NOTE — Assessment & Plan Note (Signed)
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

## 2011-11-30 NOTE — Assessment & Plan Note (Signed)
Improved. Pt applauded on succesful weight loss through lifestyle change, and encouraged to continue same. Weight loss goal set for the next several months.  

## 2011-12-09 ENCOUNTER — Other Ambulatory Visit: Payer: Self-pay | Admitting: Neurology

## 2011-12-09 ENCOUNTER — Ambulatory Visit (HOSPITAL_COMMUNITY)
Admission: RE | Admit: 2011-12-09 | Discharge: 2011-12-09 | Disposition: A | Discharge status: Home / Self Care | Payer: Medicare Other | Source: Ambulatory Visit | Attending: Neurology | Admitting: Neurology

## 2011-12-09 DIAGNOSIS — M79609 Pain in unspecified limb: Secondary | ICD-10-CM | POA: Insufficient documentation

## 2011-12-09 DIAGNOSIS — R52 Pain, unspecified: Secondary | ICD-10-CM

## 2011-12-09 DIAGNOSIS — M549 Dorsalgia, unspecified: Secondary | ICD-10-CM

## 2011-12-09 DIAGNOSIS — M76899 Other specified enthesopathies of unspecified lower limb, excluding foot: Secondary | ICD-10-CM | POA: Insufficient documentation

## 2011-12-09 DIAGNOSIS — M25559 Pain in unspecified hip: Secondary | ICD-10-CM | POA: Insufficient documentation

## 2011-12-09 DIAGNOSIS — M545 Low back pain, unspecified: Secondary | ICD-10-CM | POA: Insufficient documentation

## 2012-01-04 ENCOUNTER — Other Ambulatory Visit: Payer: Self-pay | Admitting: Family Medicine

## 2012-01-05 ENCOUNTER — Other Ambulatory Visit: Payer: Self-pay | Admitting: Family Medicine

## 2012-01-14 ENCOUNTER — Other Ambulatory Visit: Payer: Self-pay | Admitting: Family Medicine

## 2012-01-28 ENCOUNTER — Other Ambulatory Visit: Payer: Self-pay | Admitting: Family Medicine

## 2012-02-13 ENCOUNTER — Other Ambulatory Visit: Payer: Self-pay | Admitting: Family Medicine

## 2012-02-16 ENCOUNTER — Ambulatory Visit: Payer: Medicare Other | Admitting: Family Medicine

## 2012-02-18 ENCOUNTER — Other Ambulatory Visit: Payer: Self-pay | Admitting: Family Medicine

## 2012-02-25 ENCOUNTER — Encounter: Payer: Self-pay | Admitting: Family Medicine

## 2012-02-25 ENCOUNTER — Ambulatory Visit (INDEPENDENT_AMBULATORY_CARE_PROVIDER_SITE_OTHER): Payer: Medicare Other | Admitting: Family Medicine

## 2012-02-25 VITALS — BP 104/64 | HR 74 | Resp 16 | Ht 71.5 in | Wt >= 6400 oz

## 2012-02-25 DIAGNOSIS — R5381 Other malaise: Secondary | ICD-10-CM

## 2012-02-25 DIAGNOSIS — I10 Essential (primary) hypertension: Secondary | ICD-10-CM

## 2012-02-25 DIAGNOSIS — M549 Dorsalgia, unspecified: Secondary | ICD-10-CM

## 2012-02-25 DIAGNOSIS — E119 Type 2 diabetes mellitus without complications: Secondary | ICD-10-CM

## 2012-02-25 DIAGNOSIS — Z125 Encounter for screening for malignant neoplasm of prostate: Secondary | ICD-10-CM

## 2012-02-25 DIAGNOSIS — E785 Hyperlipidemia, unspecified: Secondary | ICD-10-CM

## 2012-02-25 DIAGNOSIS — F341 Dysthymic disorder: Secondary | ICD-10-CM

## 2012-02-25 DIAGNOSIS — E291 Testicular hypofunction: Secondary | ICD-10-CM

## 2012-02-25 MED ORDER — SIMVASTATIN 40 MG PO TABS: 40.0000 mg | ORAL_TABLET | Freq: Every day | ORAL | Status: DC

## 2012-02-25 MED ORDER — CITALOPRAM HYDROBROMIDE 40 MG PO TABS: ORAL_TABLET | ORAL | Status: DC

## 2012-02-25 MED ORDER — LAMOTRIGINE 200 MG PO TABS: ORAL_TABLET | ORAL | Status: DC

## 2012-02-25 MED ORDER — LISINOPRIL 10 MG PO TABS: 10.0000 mg | ORAL_TABLET | Freq: Every day | ORAL | Status: DC

## 2012-02-25 NOTE — Patient Instructions (Addendum)
F/u end December.Call if you need me before  Congrats on weight loss, keep it up ,and continue regular exercise.  Dose reduction in zestril to 10mg  daily, blood pressure is low.  Fasting lipid, cmp and EGFR , TSH and PSA end December .  You are referred to surgeon re surgery for weight loss

## 2012-02-26 ENCOUNTER — Telehealth: Payer: Self-pay | Admitting: Family Medicine

## 2012-02-26 DIAGNOSIS — I10 Essential (primary) hypertension: Secondary | ICD-10-CM

## 2012-02-26 MED ORDER — LISINOPRIL 10 MG PO TABS: 10.0000 mg | ORAL_TABLET | Freq: Every day | ORAL | Status: DC

## 2012-02-26 NOTE — Telephone Encounter (Signed)
Pt aware that he needs to reduce his BP med and it was sent in

## 2012-02-29 NOTE — Assessment & Plan Note (Signed)
Controlled, no change in medication Followed by endocrine 

## 2012-02-29 NOTE — Assessment & Plan Note (Signed)
Controlled, no change in medication DASH diet and commitment to daily physical activity for a minimum of 30 minutes discussed and encouraged, as a part of hypertension management. The importance of attaining a healthy weight is also discussed.  

## 2012-02-29 NOTE — Assessment & Plan Note (Signed)
Continue testosterone supplement

## 2012-02-29 NOTE — Assessment & Plan Note (Signed)
Hyperlipidemia:Low fat diet discussed and encouraged.  Updated lab at next visit, I am certain pt has had lab this year by endo

## 2012-02-29 NOTE — Assessment & Plan Note (Addendum)
Improved. Pt applauded on succesful weight loss through lifestyle change, and encouraged to continue same. Weight loss goal set for the next several months.  

## 2012-02-29 NOTE — Assessment & Plan Note (Signed)
Controlled, no change in medication Improved with weight loss 

## 2012-02-29 NOTE — Assessment & Plan Note (Signed)
Treated by pain specialoist , and happy with level of control, also improved with weight loss

## 2012-02-29 NOTE — Progress Notes (Signed)
  Subjective:    Patient ID: Nicholas [name], male    DOB: 28-Mar-1956, 56 y.o.   MRN: [id number]  HPI The PT is here for follow up and re-evaluation of chronic medical conditions, medication management and review of any available recent lab and radiology data.  Preventive health is updated, specifically  Cancer screening and Immunization.   Questions or concerns regarding consultations or procedures which the PT has had in the interim are  addressed. The PT denies any adverse reactions to current medications since the last visit.  There are no new concerns.  There are no specific complaints  Tests blood sugars regularly, has done extremely well in the past several months, increased physical activity, swimming , with excellent weight loss, still extremely interested in surgical intervention, and I do  Believe that he would do very well       Review of Systems See HPI Denies recent fever or chills. Denies sinus pressure, nasal congestion, ear pain or sore throat. Denies chest congestion, productive cough or wheezing. Denies chest pains, palpitations and leg swelling Denies abdominal pain, nausea, vomiting,diarrhea or constipation.   Denies dysuria, frequency, hesitancy or incontinence. C/o  joint pain,  and limitation in mobility. Denies headaches, seizures, numbness, or tingling. Denies uncontrolled  depression, anxiety or insomnia.Reports symptom improvement, a lot of this due to succesful weight loss Denies skin break down or rash.        Objective:   Physical Exam Patient alert and oriented and in no cardiopulmonary distress.  HEENT: No facial asymmetry, EOMI, no sinus tenderness,  oropharynx pink and moist.  Neck supple no adenopathy.  Chest: Clear to auscultation bilaterally.  CVS: S1, S2 no murmurs, no S3.  ABD: Soft non tender. Bowel sounds normal.  Ext: No edema  MS: Adequate though reduced  ROM spine, shoulders, hips and knees.  Skin: Intact, no ulcerations   Noted.Hyperpigmentation of skin on anterior lower extremities  Psych: Good eye contact, normal affect. Memory intact not anxious or depressed appearing.  CNS: CN 2-12 intact, power, normal throughout.        Assessment & Plan:

## 2012-03-16 ENCOUNTER — Other Ambulatory Visit: Payer: Self-pay | Admitting: Family Medicine

## 2012-05-05 ENCOUNTER — Other Ambulatory Visit: Payer: Self-pay | Admitting: Family Medicine

## 2012-06-01 ENCOUNTER — Other Ambulatory Visit: Payer: Self-pay

## 2012-06-01 MED ORDER — TESTOSTERONE 12.5 MG/ACT (1%) TD GEL: 3.0000 "application " | Freq: Three times a day (TID) | TRANSDERMAL | Status: DC

## 2012-06-10 ENCOUNTER — Other Ambulatory Visit: Payer: Self-pay | Admitting: Family Medicine

## 2012-06-11 LAB — COMPLETE METABOLIC PANEL WITH GFR
ALT: 16 U/L (ref 0–53)
Albumin: 4.3 g/dL (ref 3.5–5.2)
CO2: 31 mEq/L (ref 19–32)
Chloride: 102 mEq/L (ref 96–112)
GFR, Est African American: >89 mL/min
Glucose, Bld: 172 mg/dL — ABNORMAL HIGH (ref 70–99)
Potassium: 5.7 mEq/L — ABNORMAL HIGH (ref 3.5–5.3)
Sodium: 142 mEq/L (ref 135–145)
Total Bilirubin: 0.4 mg/dL (ref 0.3–1.2)
Total Protein: 6.5 g/dL (ref 6.0–8.3)

## 2012-06-11 LAB — LIPID PANEL
Cholesterol: 140 mg/dL (ref 0–200)
Triglycerides: 150 mg/dL — ABNORMAL HIGH (ref <150)
VLDL: 30 mg/dL (ref 0–40)

## 2012-06-14 ENCOUNTER — Encounter: Payer: Self-pay | Admitting: Family Medicine

## 2012-06-14 ENCOUNTER — Ambulatory Visit (INDEPENDENT_AMBULATORY_CARE_PROVIDER_SITE_OTHER): Payer: Medicare Other | Admitting: Family Medicine

## 2012-06-14 VITALS — BP 120/72 | HR 74 | Resp 16 | Ht 71.5 in | Wt >= 6400 oz

## 2012-06-14 DIAGNOSIS — F341 Dysthymic disorder: Secondary | ICD-10-CM

## 2012-06-14 DIAGNOSIS — I10 Essential (primary) hypertension: Secondary | ICD-10-CM

## 2012-06-14 DIAGNOSIS — E119 Type 2 diabetes mellitus without complications: Secondary | ICD-10-CM

## 2012-06-14 DIAGNOSIS — E785 Hyperlipidemia, unspecified: Secondary | ICD-10-CM

## 2012-06-14 DIAGNOSIS — G8929 Other chronic pain: Secondary | ICD-10-CM

## 2012-06-14 MED ORDER — LAMOTRIGINE 200 MG PO TABS: ORAL_TABLET | ORAL | Status: DC

## 2012-06-14 MED ORDER — SIMVASTATIN 40 MG PO TABS: 40.0000 mg | ORAL_TABLET | Freq: Every day | ORAL | Status: DC

## 2012-06-14 MED ORDER — LISINOPRIL 10 MG PO TABS: 10.0000 mg | ORAL_TABLET | Freq: Every day | ORAL | Status: DC

## 2012-06-14 MED ORDER — CYCLOBENZAPRINE HCL 10 MG PO TABS: ORAL_TABLET | ORAL | Status: DC

## 2012-06-14 NOTE — Patient Instructions (Addendum)
F/u in 4 month, for rectal exam  CONGRATS on excellent lifestyle changes with consistent weight loss, keep it up, you are an inspiration!!!  Please get your flu vaccine at the pharmacy , you need this  We will f/u on colonoscopy result , and be in touch if necessary   Please cut back on cheese   PSA and tSH  Are added to current lab  PLEASE log into "My chart" to see results  Fasting lipid, cmp and EGFR in 4 month

## 2012-06-14 NOTE — Progress Notes (Signed)
  Subjective:    Patient ID: Nicholas [name], male    DOB: Mar 17, 1956, 56 y.o.   MRN: [id number]  HPI The PT is here for follow up and re-evaluation of chronic medical conditions, medication management and review of any available recent lab and radiology data.  Preventive health is updated, specifically  Cancer screening and Immunization.   Questions or concerns regarding consultations or procedures which the PT has had in the interim are  addressed. The PT denies any adverse reactions to current medications since the last visit.  There are no new concerns. Continues to do exceptionally with lifestyle change leading to improved health was inspired in February of this year and has remained commited to increased regular exercise and control of calories. Feels much better There are no specific complaints       Review of Systems See HPI Denies recent fever or chills. Denies sinus pressure, nasal congestion, ear pain or sore throat. Denies chest congestion, productive cough or wheezing. Denies chest pains, palpitations and leg swelling Denies abdominal pain, nausea, vomiting,diarrhea or constipation.   Denies dysuria, frequency, hesitancy or incontinence. Chronic  joint pain,  Denies headaches, seizures, numbness, or tingling. Denies uncontrolled  depression, anxiety or insomnia.This is improving Denies skin break down or rash.        Objective:   Physical Exam  Patient alert and oriented and in no cardiopulmonary distress.  HEENT: No facial asymmetry, EOMI, no sinus tenderness,  oropharynx pink and moist.  Neck supple no adenopathy.  Chest: Clear to auscultation bilaterally.  CVS: S1, S2 no murmurs, no S3.  ABD: Soft non tender. Bowel sounds normal.  Ext: No edema  MS: Adequate ROM spine, shoulders, hips and knees.  Skin: Intact, no ulcerations or rash noted.  Psych: Good eye contact, normal affect. Memory intact not anxious or depressed appearing.  CNS: CN 2-12  intact, power, tone and sensation normal throughout.       Assessment & Plan:

## 2012-07-09 NOTE — Assessment & Plan Note (Signed)
Followed by endo and controlled 

## 2012-07-09 NOTE — Assessment & Plan Note (Signed)
Controlled, no change in medication  

## 2012-07-09 NOTE — Assessment & Plan Note (Signed)
Treated through [facility] and well controlled

## 2012-07-09 NOTE — Assessment & Plan Note (Signed)
Hyperlipidemia:Low fat diet discussed and encouraged. Triglycerides slightly elevated, needs to cut back on butter and cheese. No med change

## 2012-07-09 NOTE — Assessment & Plan Note (Signed)
Improved. Pt applauded on succesful weight loss through lifestyle change, and encouraged to continue same. Weight loss goal set for the next several months.  

## 2012-07-09 NOTE — Assessment & Plan Note (Signed)
Controlled, no change in medication DASH diet and commitment to daily physical activity for a minimum of 30 minutes discussed and encouraged, as a part of hypertension management. The importance of attaining a healthy weight is also discussed.  

## 2012-07-24 ENCOUNTER — Other Ambulatory Visit: Payer: Self-pay | Admitting: Family Medicine

## 2012-08-18 ENCOUNTER — Other Ambulatory Visit: Payer: Self-pay

## 2012-08-19 ENCOUNTER — Other Ambulatory Visit: Payer: Self-pay | Admitting: Family Medicine

## 2012-08-19 MED ORDER — TESTOSTERONE 20.25 MG/ACT (1.62%) TD GEL: 2.0000 | Freq: Every morning | TRANSDERMAL | Status: DC

## 2012-08-31 ENCOUNTER — Other Ambulatory Visit: Payer: Self-pay

## 2012-08-31 MED ORDER — SIMVASTATIN 40 MG PO TABS: 40.0000 mg | ORAL_TABLET | Freq: Every day | ORAL | Status: DC

## 2012-10-18 LAB — COMPLETE METABOLIC PANEL WITH GFR
ALT: 14 U/L (ref 0–53)
AST: 14 U/L (ref 0–37)
Alkaline Phosphatase: 74 U/L (ref 39–117)
CO2: 28 mEq/L (ref 19–32)
GFR, Est African American: >89 mL/min
Sodium: 142 mEq/L (ref 135–145)
Total Bilirubin: 0.5 mg/dL (ref 0.3–1.2)
Total Protein: 6.6 g/dL (ref 6.0–8.3)

## 2012-10-18 LAB — LIPID PANEL
HDL: 39 mg/dL — ABNORMAL LOW (ref >39)
LDL Cholesterol: 59 mg/dL (ref 0–99)

## 2012-10-20 ENCOUNTER — Other Ambulatory Visit: Payer: Self-pay | Admitting: Family Medicine

## 2012-10-25 ENCOUNTER — Encounter: Payer: Self-pay | Admitting: Family Medicine

## 2012-10-25 ENCOUNTER — Ambulatory Visit (INDEPENDENT_AMBULATORY_CARE_PROVIDER_SITE_OTHER): Payer: Medicare Other | Admitting: Family Medicine

## 2012-10-25 VITALS — BP 122/68 | HR 74 | Resp 18 | Ht 71.5 in | Wt >= 6400 oz

## 2012-10-25 DIAGNOSIS — E291 Testicular hypofunction: Secondary | ICD-10-CM

## 2012-10-25 DIAGNOSIS — F32A Depression, unspecified: Secondary | ICD-10-CM

## 2012-10-25 DIAGNOSIS — F341 Dysthymic disorder: Secondary | ICD-10-CM

## 2012-10-25 DIAGNOSIS — F329 Major depressive disorder, single episode, unspecified: Secondary | ICD-10-CM

## 2012-10-25 DIAGNOSIS — E119 Type 2 diabetes mellitus without complications: Secondary | ICD-10-CM

## 2012-10-25 DIAGNOSIS — I10 Essential (primary) hypertension: Secondary | ICD-10-CM

## 2012-10-25 DIAGNOSIS — M549 Dorsalgia, unspecified: Secondary | ICD-10-CM

## 2012-10-25 DIAGNOSIS — E785 Hyperlipidemia, unspecified: Secondary | ICD-10-CM

## 2012-10-25 MED ORDER — LISINOPRIL 5 MG PO TABS: 5.0000 mg | ORAL_TABLET | Freq: Every day | ORAL | Status: DC

## 2012-10-25 MED ORDER — CITALOPRAM HYDROBROMIDE 20 MG PO TABS: 20.0000 mg | ORAL_TABLET | Freq: Every day | ORAL | Status: DC

## 2012-10-25 NOTE — Progress Notes (Signed)
  Subjective:    Patient ID: Nicholas [name], male    DOB: 10/25/1955, 57 y.o.   MRN: [id number]  HPI The PT is here for follow up and re-evaluation of chronic medical conditions, medication management and review of any available recent lab and radiology data.  Preventive health is updated, specifically  Cancer screening and Immunization.   Has stopped taking pain meds for the past 3 month, and wants to get rid of as much medication as is possible The PT denies any adverse reactions to current medications since the last visit.  C/o slow weight loss, wants help, remains comited to at least 5 days per week of swimming which he enjoys, does not know how to count calories and wants help with this       Review of Systems See HPI Denies recent fever or chills. Denies sinus pressure, nasal congestion, ear pain or sore throat. Denies chest congestion, productive cough or wheezing. Denies chest pains, palpitations and leg swelling Denies abdominal pain, nausea, vomiting,diarrhea or constipation.   Denies dysuria, frequency, hesitancy or incontinence. Improved back pain and on no more medication. Denies headaches, seizures, numbness, or tingling. Denies uncontrolled depression, anxiety or insomnia.Willing to reduce med dose Denies skin break down or rash.        Objective:   Physical Exam Patient alert and oriented and in no cardiopulmonary distress.  HEENT: No facial asymmetry, EOMI, no sinus tenderness,  oropharynx pink and moist.  Neck supple no adenopathy.  Chest: Clear to auscultation bilaterally.  CVS: S1, S2 no murmurs, no S3.  ABD: Soft non tender. Bowel sounds normal.  Ext: No edema  MS: Adequate ROM spine, shoulders, hips and knees.  Skin: Intact, no ulcerations or rash noted.  Psych: Good eye contact, normal affect. Memory intact not anxious or depressed appearing.  CNS: CN 2-12 intact, power, tone and sensation normal throughout.        Assessment & Plan:

## 2012-10-25 NOTE — Patient Instructions (Addendum)
F/u in 4 month, please call if you need me before  You are referred for individual counselling for weight loss at the hospital, this is free.  Congrats on recent weight loss , continue.  Dose reduced for celexa, lotensin and discontinue the testosterone gel  Please keep active and motivated you have lost 80 pounds in 2 years!  Non fasting hematocrit and chem 7 in 4 month pls  Weight loss goal of 16 pounds  You will get an 1800 cal diet sheet  Meds as listed are what you will continue to take

## 2012-10-30 NOTE — Assessment & Plan Note (Signed)
Hyperlipidemia:Low fat diet discussed and encouraged.  Controlled, reduced med dosage

## 2012-10-30 NOTE — Assessment & Plan Note (Signed)
Pt will discontinue testosterone supplement which he has been on for years, if feels need to resume will go through urology

## 2012-10-30 NOTE — Assessment & Plan Note (Signed)
Improved with weight loss and exercise, has discontinued all pain meds

## 2012-10-30 NOTE — Assessment & Plan Note (Signed)
Well controlled, treated by endo Patient advised to reduce carb and sweets, commit to regular physical activity, take meds as prescribed, test blood as directed, and attempt to lose weight, to improve blood sugar control. Referred to nutritionist for counseling to help with weight loss by c

## 2012-10-30 NOTE — Assessment & Plan Note (Signed)
Improved. Pt applauded on succesful weight loss through lifestyle change, and encouraged to continue same. Weight loss goal set for the next several months.  

## 2012-10-30 NOTE — Assessment & Plan Note (Signed)
Controlled, no change in medication DASH diet and commitment to daily physical activity for a minimum of 30 minutes discussed and encouraged, as a part of hypertension management. The importance of attaining a healthy weight is also discussed.  

## 2012-10-30 NOTE — Assessment & Plan Note (Signed)
Improved with regular exercise and weight loss. Will reduce doe of celexa

## 2012-11-08 ENCOUNTER — Ambulatory Visit (INDEPENDENT_AMBULATORY_CARE_PROVIDER_SITE_OTHER): Payer: Medicare Other | Admitting: Family Medicine

## 2012-11-08 ENCOUNTER — Encounter: Payer: Self-pay | Admitting: Family Medicine

## 2012-11-08 VITALS — BP 110/70 | HR 66 | Resp 20 | Ht 71.5 in | Wt >= 6400 oz

## 2012-11-08 DIAGNOSIS — M25519 Pain in unspecified shoulder: Secondary | ICD-10-CM

## 2012-11-08 DIAGNOSIS — M25569 Pain in unspecified knee: Secondary | ICD-10-CM

## 2012-11-08 DIAGNOSIS — M25512 Pain in left shoulder: Secondary | ICD-10-CM

## 2012-11-08 DIAGNOSIS — M25562 Pain in left knee: Secondary | ICD-10-CM

## 2012-11-08 MED ORDER — KETOROLAC TROMETHAMINE 60 MG/2ML IJ SOLN
60.0000 mg | Freq: Once | INTRAMUSCULAR | Status: AC
  Administered 2012-11-08: 60 mg via INTRAMUSCULAR

## 2012-11-08 MED ORDER — METHYLPREDNISOLONE ACETATE 80 MG/ML IJ SUSP
80.0000 mg | Freq: Once | INTRAMUSCULAR | Status: AC
  Administered 2012-11-08: 80 mg via INTRAMUSCULAR

## 2012-11-08 MED ORDER — IBUPROFEN 800 MG PO TABS: 800.0000 mg | ORAL_TABLET | Freq: Three times a day (TID) | ORAL | Status: DC | PRN

## 2012-11-08 MED ORDER — ESOMEPRAZOLE MAGNESIUM 20 MG PO CPDR: 20.0000 mg | DELAYED_RELEASE_CAPSULE | Freq: Every day | ORAL | Status: DC

## 2012-11-08 MED ORDER — PREDNISONE 5 MG PO TABS: 5.0000 mg | ORAL_TABLET | Freq: Two times a day (BID) | ORAL | Status: AC

## 2012-11-08 NOTE — Progress Notes (Signed)
  Subjective:    Patient ID: Nicholas [name], male    DOB: 1955/11/01, 57 y.o.   MRN: [id number]  HPI 1 week ago pt swam for 3 hours instead of 2 hours, since then he has had increased right shoulder and left knee pain and swelling. Otherwise , doing well with no other  concerns or complaints    Review of Systems See HPI Denies recent fever or chills. Denies sinus pressure, nasal congestion, ear pain or sore throat. Denies chest congestion, productive cough or wheezing. Denies chest pains, palpitations and leg swelling Denies abdominal pain, nausea, vomiting,diarrhea or constipation.   Denies dysuria, frequency, hesitancy or incontinence.  Denies headaches, seizures, numbness, or tingling. Denies depression, anxiety or insomnia. Denies skin break down or rash.        Objective:   Physical Exam  Patient alert and oriented and in no cardiopulmonary distress.  HEENT: No facial asymmetry, EOMI, no sinus tenderness,  oropharynx pink and moist.  Neck supple no adenopathy.  Chest: Clear to auscultation bilaterally.  CVS: S1, S2 no murmurs, no S3.  ABD: Soft non tender. Bowel sounds normal.  Ext: No edema  MS: Adequate ROM spine,decreased ROM right  shoulder, and left  Knee which is swollen and tender.  Skin: Intact, no ulcerations or rash noted.  Psych: Good eye contact, normal affect. Memory intact not anxious or depressed appearing.  CNS: CN 2-12 intact, power, tone and sensation normal throughout.       Assessment & Plan:

## 2012-11-08 NOTE — Patient Instructions (Addendum)
F/u as before.  You have acute arthritis of left knee and acute pain in right shoulder due to overuse.  Please do not overdo exercise as you can injure yourself, as you have.  Two injections in the office today, toradol and depo medrol, both are anti inflammatory. Then start today ibuprofen and prednisone. Also nexium one daily to reduce stomach irritation. Continue to use bengay as the topical rub to the knee.  Blood pressure is excellent

## 2012-11-10 NOTE — Addendum Note (Signed)
Addended by: Kandis Fantasia B on: 11/10/2012 04:43 PM   Modules accepted: Orders

## 2012-11-11 ENCOUNTER — Telehealth (HOSPITAL_COMMUNITY): Payer: Self-pay | Admitting: Dietician

## 2012-11-11 ENCOUNTER — Ambulatory Visit (HOSPITAL_COMMUNITY)
Admission: RE | Admit: 2012-11-11 | Discharge: 2012-11-11 | Disposition: A | Discharge status: Home / Self Care | Payer: Medicare Other | Source: Ambulatory Visit | Attending: Family Medicine | Admitting: Family Medicine

## 2012-11-11 ENCOUNTER — Ambulatory Visit (INDEPENDENT_AMBULATORY_CARE_PROVIDER_SITE_OTHER): Payer: Medicare Other | Admitting: Family Medicine

## 2012-11-11 ENCOUNTER — Encounter: Payer: Self-pay | Admitting: Family Medicine

## 2012-11-11 VITALS — BP 120/80 | HR 74 | Resp 16 | Wt >= 6400 oz

## 2012-11-11 DIAGNOSIS — L089 Local infection of the skin and subcutaneous tissue, unspecified: Secondary | ICD-10-CM | POA: Insufficient documentation

## 2012-11-11 DIAGNOSIS — E119 Type 2 diabetes mellitus without complications: Secondary | ICD-10-CM | POA: Insufficient documentation

## 2012-11-11 MED ORDER — SULFAMETHOXAZOLE-TRIMETHOPRIM 800-160 MG PO TABS: 1.0000 | ORAL_TABLET | Freq: Two times a day (BID) | ORAL | Status: AC

## 2012-11-11 NOTE — Patient Instructions (Signed)
Keep toe dry and clean Start antibiotics Stop Prednisone Appt with wound clinic Get the xray of your foot

## 2012-11-11 NOTE — Telephone Encounter (Signed)
Called pt at 1430. He reports he is not interested in scheduling an appointment at this time. Provided RD contact information. Pt appreciative of call.

## 2012-11-11 NOTE — Progress Notes (Signed)
  Subjective:    Patient ID: Nicholas [name], male    DOB: May 18, 1956, 57 y.o.   MRN: [id number]  HPI Pt here with right great toe infection. Noticed a tiny black spot 1 week ago, last night looked at toe and noticed it had cracked wide open on bottom also noticed black area getting larger. Has tingling in toe, no pain. No drainage noted besides blood He swims for exercise, he did have a large callus on toe Last seen by podiatry 6 weeks ago On prednisone currently for arthritis   Review of Systems - per abpve  GEN- denies fatigue, fever, weight loss,weakness, recent illness CVS- denies chest pain, palpitations MSK- per above, +joint pain GI- denies diarrhea, N/V      Objective:   Physical Exam  GEN-NAD, alert and oriented x 3, obese EXT- Right great toe- black area ? Eschar size of quarter, +swelling of toe, mild pain with flexion, 1.5cm linear opening across sole of foot and toe, +bleeding, erythema covering great toe. Venous stasis changes +edema      Assessment & Plan:

## 2012-11-11 NOTE — Assessment & Plan Note (Addendum)
No evidence of osteomyelitis on xray He has an eschar appealing lesion and a laceration in toe  Start antibiotics Stop prednisone Bandage applied  Wound appt 1st thing next week for continue care, may need debridement

## 2012-11-11 NOTE — Telephone Encounter (Signed)
Received referral from Dr. Lodema Hong [facility]) for dx: obesity, diabetes. Per MD, pt is very motivated has has lost 80# intentionally in the past 2 years.

## 2012-11-14 ENCOUNTER — Telehealth: Payer: Self-pay | Admitting: Family Medicine

## 2012-11-14 ENCOUNTER — Other Ambulatory Visit: Payer: Self-pay | Admitting: Family Medicine

## 2012-11-14 MED ORDER — HYDROCODONE-ACETAMINOPHEN 5-325 MG PO TABS: 1.0000 | ORAL_TABLET | ORAL | Status: DC | PRN

## 2012-11-14 NOTE — Telephone Encounter (Signed)
And let patient know 

## 2012-11-14 NOTE — Telephone Encounter (Signed)
Med faxed.  Unable to leave msg for patient.  No voicemail.

## 2012-11-14 NOTE — Telephone Encounter (Signed)
Hydrocodone sent, please let pt know

## 2012-11-14 NOTE — Telephone Encounter (Signed)
Patient aware.

## 2012-11-17 ENCOUNTER — Other Ambulatory Visit: Payer: Self-pay | Admitting: Family Medicine

## 2012-11-21 DIAGNOSIS — M25511 Pain in right shoulder: Secondary | ICD-10-CM | POA: Insufficient documentation

## 2012-11-21 NOTE — Assessment & Plan Note (Signed)
Acute flare, anti inflammatories administered and prescribed

## 2012-11-21 NOTE — Assessment & Plan Note (Signed)
Overuse the trigger to this . Anti inflammatories administered and prescribed. Pt counseled against over use

## 2012-12-22 ENCOUNTER — Other Ambulatory Visit: Payer: Self-pay | Admitting: Family Medicine

## 2012-12-26 ENCOUNTER — Encounter: Payer: Self-pay | Admitting: Family Medicine

## 2012-12-27 ENCOUNTER — Other Ambulatory Visit: Payer: Self-pay | Admitting: Family Medicine

## 2013-01-02 NOTE — Telephone Encounter (Signed)
Do you want to fill this? 

## 2013-01-02 NOTE — Telephone Encounter (Signed)
This is from Dr Gerilyn Pilgrim pls forward

## 2013-01-31 ENCOUNTER — Other Ambulatory Visit: Payer: Self-pay | Admitting: Family Medicine

## 2013-01-31 LAB — CBC
HCT: 44 % (ref 39.0–52.0)
Hemoglobin: 14.2 g/dL (ref 13.0–17.0)
MCH: 29.1 pg (ref 26.0–34.0)
MCV: 90.2 fL (ref 78.0–100.0)
RBC: 4.88 MIL/uL (ref 4.22–5.81)
WBC: 9.3 10*3/uL (ref 4.0–10.5)

## 2013-02-22 ENCOUNTER — Encounter: Payer: Self-pay | Admitting: Family Medicine

## 2013-02-22 ENCOUNTER — Telehealth: Payer: Self-pay

## 2013-02-22 NOTE — Telephone Encounter (Signed)
Got a bill for 06/14/2012 that said his PSA total screening was not covered and it was $40. He did not get a prostate exam and doesn't know why he got a bill for this. Please advise. I see where a PSA was charged at the office visit? Did this mean a prostate exam was done?

## 2013-02-23 NOTE — Telephone Encounter (Signed)
Based on OV notes from 06/14/12 - looks like PSA testing was added to Lab order.

## 2013-02-23 NOTE — Telephone Encounter (Signed)
My understanding when I was taught coding was that once the blood  test was ENTERED , under preventive care screen by   MD ordering then  billing to occur bills, this is separate from the rectal exam which includes physical examination of the prostate, which can be billed under a separate code which I never bill based on info I was given. You  need to check on this because this is my understanding and what I have been consistently doing, and has not been a problem until now, thanks

## 2013-03-02 ENCOUNTER — Other Ambulatory Visit: Payer: Self-pay | Admitting: Family Medicine

## 2013-03-06 ENCOUNTER — Other Ambulatory Visit: Payer: Self-pay | Admitting: Family Medicine

## 2013-03-08 ENCOUNTER — Encounter: Payer: Self-pay | Admitting: Family Medicine

## 2013-03-08 ENCOUNTER — Ambulatory Visit (INDEPENDENT_AMBULATORY_CARE_PROVIDER_SITE_OTHER): Payer: Medicare Other | Admitting: Family Medicine

## 2013-03-08 VITALS — BP 118/78 | HR 77 | Resp 16 | Ht 71.5 in | Wt >= 6400 oz

## 2013-03-08 DIAGNOSIS — E1129 Type 2 diabetes mellitus with other diabetic kidney complication: Secondary | ICD-10-CM

## 2013-03-08 DIAGNOSIS — Z23 Encounter for immunization: Secondary | ICD-10-CM

## 2013-03-08 DIAGNOSIS — F341 Dysthymic disorder: Secondary | ICD-10-CM

## 2013-03-08 DIAGNOSIS — N058 Unspecified nephritic syndrome with other morphologic changes: Secondary | ICD-10-CM

## 2013-03-08 DIAGNOSIS — E1121 Type 2 diabetes mellitus with diabetic nephropathy: Secondary | ICD-10-CM

## 2013-03-08 DIAGNOSIS — I10 Essential (primary) hypertension: Secondary | ICD-10-CM

## 2013-03-08 DIAGNOSIS — M549 Dorsalgia, unspecified: Secondary | ICD-10-CM

## 2013-03-08 MED ORDER — PREDNISONE 5 MG PO TABS: 5.0000 mg | ORAL_TABLET | Freq: Two times a day (BID) | ORAL | Status: AC

## 2013-03-08 MED ORDER — BUPROPION HCL ER (XL) 150 MG PO TB24: 150.0000 mg | ORAL_TABLET | Freq: Every day | ORAL | Status: DC

## 2013-03-08 MED ORDER — METHYLPREDNISOLONE ACETATE 80 MG/ML IJ SUSP
80.0000 mg | Freq: Once | INTRAMUSCULAR | Status: AC
  Administered 2013-03-08: 80 mg via INTRAMUSCULAR

## 2013-03-08 MED ORDER — KETOROLAC TROMETHAMINE 60 MG/2ML IJ SOLN
60.0000 mg | Freq: Once | INTRAMUSCULAR | Status: AC
  Administered 2013-03-08: 60 mg via INTRAMUSCULAR

## 2013-03-08 NOTE — Progress Notes (Signed)
  Subjective:    Patient ID: Nicholas [name], male    DOB: [birth year], 57 y.o.   MRN: [id number]  HPI 1 week h/o severe low back pain radiating to left knee, rated at a 10. Denies lower extremity weakness  Or numbness, feels as though this was aggravated by recent attempt at resuming swimming with over exertion of himself and residual muscle strain. Denies incontinence of stool or urine C/o de motivation as far as regular exercise is concerned and he has subsequently regained weight which is depressing him further. Of note, he had attempted to reduce dosage of antidepressant , with a view to discontinuing eventually. He is not suicidal or homicidal   Review of Systems See HPI Denies recent fever or chills. Denies sinus pressure, nasal congestion, ear pain or sore throat. Denies chest congestion, productive cough or wheezing. Denies chest pains, palpitations and leg swelling Denies abdominal pain, nausea, vomiting,diarrhea or constipation.   Denies dysuria, frequency, hesitancy or incontinence.  Denies headaches, seizures, numbness, or tingling. . Denies skin break down or rash.        Objective:   Physical Exam Patient alert and oriented and in no cardiopulmonary distress.Pt in pain   HEENT: No facial asymmetry, EOMI, no sinus tenderness,  oropharynx pink and moist.  Neck supple no adenopathy.  Chest: Clear to auscultation bilaterally.  CVS: S1, S2 no murmurs, no S3.  ABD: Soft non tender. Bowel sounds normal.  Ext: No edema  MS: decreased  ROM lumbar spine, left  hip and knee.  Skin: Intact, no ulcerations or rash noted.  Psych: Good eye contact, normal affect. Memory intact not anxious or depressed appearing.  CNS: CN 2-12 intact, power, tone and sensation normal throughout.        Assessment & Plan:

## 2013-03-08 NOTE — Patient Instructions (Addendum)
F/u in 8 weeks.  New additional medication for depression , which will help with re motivation to exercise and with weight loss, continue celexa as before. Call in 4 to 6 weeks if not feeling better please  Toradol 60mg  and depo medrol 80 mg Im for back pain to left knee, also ibuprofen 800mg  one 3 times daily fior 1 week, an prednisone 5mg  one twice daily is sent in for 5 days  Flu vaccine today.  Reconsider gastric  By[pass, I believe this will help you alot

## 2013-03-23 ENCOUNTER — Telehealth: Payer: Self-pay | Admitting: Family Medicine

## 2013-03-23 ENCOUNTER — Telehealth: Payer: Self-pay

## 2013-03-23 NOTE — Telephone Encounter (Signed)
See previous message.  Awaiting response from Md.

## 2013-03-23 NOTE — Telephone Encounter (Signed)
Advise OTC diabetic robitussin , lots of fluids, tlyenol for aches and pain , can schedule for next Monday, if he does not get better without antibiotic (If he felt sick enough I think he would come in this week)

## 2013-03-24 NOTE — Telephone Encounter (Signed)
Patient aware and will call back if still feeling sick next week

## 2013-03-27 ENCOUNTER — Encounter: Payer: Self-pay | Admitting: Family Medicine

## 2013-03-27 ENCOUNTER — Ambulatory Visit (HOSPITAL_COMMUNITY)
Admission: RE | Admit: 2013-03-27 | Discharge: 2013-03-27 | Disposition: A | Discharge status: Home / Self Care | Payer: Medicare Other | Source: Ambulatory Visit | Attending: Family Medicine | Admitting: Family Medicine

## 2013-03-27 ENCOUNTER — Ambulatory Visit (INDEPENDENT_AMBULATORY_CARE_PROVIDER_SITE_OTHER): Payer: Medicare Other | Admitting: Family Medicine

## 2013-03-27 VITALS — BP 112/70 | HR 86 | Resp 20 | Ht 71.5 in | Wt >= 6400 oz

## 2013-03-27 DIAGNOSIS — R918 Other nonspecific abnormal finding of lung field: Secondary | ICD-10-CM | POA: Insufficient documentation

## 2013-03-27 DIAGNOSIS — R059 Cough, unspecified: Secondary | ICD-10-CM | POA: Insufficient documentation

## 2013-03-27 DIAGNOSIS — M549 Dorsalgia, unspecified: Secondary | ICD-10-CM

## 2013-03-27 DIAGNOSIS — F341 Dysthymic disorder: Secondary | ICD-10-CM

## 2013-03-27 DIAGNOSIS — J209 Acute bronchitis, unspecified: Secondary | ICD-10-CM

## 2013-03-27 DIAGNOSIS — R05 Cough: Secondary | ICD-10-CM | POA: Insufficient documentation

## 2013-03-27 MED ORDER — SULFAMETHOXAZOLE-TRIMETHOPRIM 800-160 MG PO TABS: 1.0000 | ORAL_TABLET | Freq: Two times a day (BID) | ORAL | Status: DC

## 2013-03-27 MED ORDER — CEFTRIAXONE SODIUM 1 G IJ SOLR
500.0000 mg | Freq: Once | INTRAMUSCULAR | Status: AC
  Administered 2013-03-27: 500 mg via INTRAMUSCULAR

## 2013-03-27 MED ORDER — PREDNISONE 5 MG PO TABS: 5.0000 mg | ORAL_TABLET | Freq: Two times a day (BID) | ORAL | Status: AC

## 2013-03-27 MED ORDER — PROMETHAZINE-DM 6.25-15 MG/5ML PO SYRP: ORAL_SOLUTION | ORAL | Status: DC

## 2013-03-27 NOTE — Assessment & Plan Note (Signed)
Refer for surgical eval for bypass directly

## 2013-03-27 NOTE — Assessment & Plan Note (Signed)
Acute onset after excessive swimming toradol and depo medrol followed by oral meds

## 2013-03-27 NOTE — Assessment & Plan Note (Signed)
Deteriorated , resume meds

## 2013-03-27 NOTE — Assessment & Plan Note (Signed)
Not much change with rsuming antibiotics at this time

## 2013-03-27 NOTE — Assessment & Plan Note (Signed)
Excellent response to anti inflammatories, resolved

## 2013-03-27 NOTE — Assessment & Plan Note (Signed)
Improved control and treated by endo

## 2013-03-27 NOTE — Progress Notes (Signed)
  Subjective:    Patient ID: Nicholas [name], male    DOB: 10-Nov-1955, 57 y.o.   MRN: [id number]  HPI 2 week h/o excessive chest congestion and cough ever since getting the flu vaccine. Sputum is brown, denies sinus pressure , post nasal drainage , sore throat or ear pain.Has had intermittent chills and fever. Chest pain from excessive cough Has al;so  Now decided to try direct access to surgeon for gastric bypass, had unfortunate experience in the past , when after completing all paper work etc this "got lost" Has decided needs the surgery as he still has not started exercising   Review of Systems See HPI Denies chest pains, palpitations and leg swelling Denies abdominal pain, nausea, vomiting,diarrhea or constipation.   Denies dysuria, frequency, hesitancy or incontinence. Denies joint pain, swelling and limitation in mobility. Denies headaches, seizures, numbness, or tingling. Denies depression, anxiety or insomnia. Denies skin break down or rash.         Objective:   Physical Exam  Patient alert and oriented and in no cardiopulmonary distress.  HEENT: No facial asymmetry, EOMI, no sinus tenderness,  oropharynx pink and moist.  Neck supple no adenopathy.TM clear  Chest: decreased air entry scattered crackles, no wheezes  CVS: S1, S2 no murmurs, no S3.  ABD: Soft non tender. Bowel sounds normal.  Ext: No edema  MS: Adequate ROM spine, shoulders, hips and knees.  Skin: Intact, no ulcerations or rash noted.  Psych: Good eye contact, normal affect. Memory intact not anxious or depressed appearing.  CNS: CN 2-12 intact, power, tone and sensation normal throughout.       Assessment & Plan:

## 2013-03-27 NOTE — Assessment & Plan Note (Signed)
Deteriorated. Patient re-educated about  the importance of commitment to a  minimum of 150 minutes of exercise per week. The importance of healthy food choices with portion control discussed. Encouraged to start a food diary, count calories and to consider  joining a support group. Sample diet sheets offered. Goals set by the patient for the next several months.    

## 2013-03-27 NOTE — Assessment & Plan Note (Signed)
Rocephin in office followed by 10 day antibiotic course with prednisone and cough suppressant  CXR today

## 2013-03-27 NOTE — Patient Instructions (Addendum)
F/u as before   You have acute bronchitis, Rocephin 500mg  IM in the office and antibiotics, 5 day course of prednisone and cough suppressant syrup prescribed  Please get a CXr today also  You are being referred  Directly to  A a surgeon for gastric bypass surgery, we will call you back on this

## 2013-03-27 NOTE — Assessment & Plan Note (Signed)
Controlled, no change in medication DASH diet and commitment to daily physical activity for a minimum of 30 minutes discussed and encouraged, as a part of hypertension management. The importance of attaining a healthy weight is also discussed.  

## 2013-03-28 ENCOUNTER — Telehealth: Payer: Self-pay | Admitting: Family Medicine

## 2013-03-28 ENCOUNTER — Other Ambulatory Visit: Payer: Self-pay | Admitting: Family Medicine

## 2013-03-28 DIAGNOSIS — J189 Pneumonia, unspecified organism: Secondary | ICD-10-CM

## 2013-03-28 NOTE — Telephone Encounter (Signed)
r he feels better

## 2013-03-28 NOTE — Telephone Encounter (Signed)
Noted, thanks!

## 2013-04-03 ENCOUNTER — Other Ambulatory Visit: Payer: Self-pay | Admitting: Family Medicine

## 2013-04-14 ENCOUNTER — Other Ambulatory Visit: Payer: Self-pay | Admitting: Family Medicine

## 2013-04-17 ENCOUNTER — Ambulatory Visit (HOSPITAL_COMMUNITY)
Admission: RE | Admit: 2013-04-17 | Discharge: 2013-04-17 | Disposition: A | Discharge status: Home / Self Care | Payer: Medicare Other | Source: Ambulatory Visit | Attending: Family Medicine | Admitting: Family Medicine

## 2013-04-17 ENCOUNTER — Ambulatory Visit (INDEPENDENT_AMBULATORY_CARE_PROVIDER_SITE_OTHER): Payer: Medicare Other | Admitting: Family Medicine

## 2013-04-17 ENCOUNTER — Encounter: Payer: Self-pay | Admitting: Family Medicine

## 2013-04-17 ENCOUNTER — Encounter (INDEPENDENT_AMBULATORY_CARE_PROVIDER_SITE_OTHER): Payer: Self-pay

## 2013-04-17 VITALS — BP 128/78 | HR 78 | Temp 97.5°F | Resp 16 | Ht 71.5 in | Wt >= 6400 oz

## 2013-04-17 DIAGNOSIS — L84 Corns and callosities: Secondary | ICD-10-CM

## 2013-04-17 DIAGNOSIS — I10 Essential (primary) hypertension: Secondary | ICD-10-CM

## 2013-04-17 DIAGNOSIS — N058 Unspecified nephritic syndrome with other morphologic changes: Secondary | ICD-10-CM

## 2013-04-17 DIAGNOSIS — E1121 Type 2 diabetes mellitus with diabetic nephropathy: Secondary | ICD-10-CM

## 2013-04-17 DIAGNOSIS — E1129 Type 2 diabetes mellitus with other diabetic kidney complication: Secondary | ICD-10-CM

## 2013-04-17 DIAGNOSIS — E11628 Type 2 diabetes mellitus with other skin complications: Secondary | ICD-10-CM

## 2013-04-17 DIAGNOSIS — E1169 Type 2 diabetes mellitus with other specified complication: Secondary | ICD-10-CM

## 2013-04-17 DIAGNOSIS — J209 Acute bronchitis, unspecified: Secondary | ICD-10-CM

## 2013-04-17 DIAGNOSIS — B351 Tinea unguium: Secondary | ICD-10-CM

## 2013-04-17 DIAGNOSIS — J189 Pneumonia, unspecified organism: Secondary | ICD-10-CM | POA: Insufficient documentation

## 2013-04-17 DIAGNOSIS — E785 Hyperlipidemia, unspecified: Secondary | ICD-10-CM

## 2013-04-17 DIAGNOSIS — F341 Dysthymic disorder: Secondary | ICD-10-CM

## 2013-04-17 LAB — CBC WITH DIFFERENTIAL/PLATELET
Basophils Absolute: 0 10*3/uL (ref 0.0–0.1)
Basophils Relative: 0 % (ref 0–1)
Eosinophils Absolute: 0.3 10*3/uL (ref 0.0–0.7)
HCT: 46.6 % (ref 39.0–52.0)
MCHC: 33.5 g/dL (ref 30.0–36.0)
MCV: 91.7 fL (ref 78.0–100.0)
Monocytes Absolute: 0.9 10*3/uL (ref 0.1–1.0)
Neutro Abs: 7.4 10*3/uL (ref 1.7–7.7)
Neutrophils Relative %: 64 % (ref 43–77)
RDW: 14.7 % (ref 11.5–15.5)

## 2013-04-17 MED ORDER — PROMETHAZINE-DM 6.25-15 MG/5ML PO SYRP: ORAL_SOLUTION | ORAL | Status: AC

## 2013-04-17 MED ORDER — AMOXICILLIN-POT CLAVULANATE 500-125 MG PO TABS: 1.0000 | ORAL_TABLET | Freq: Three times a day (TID) | ORAL | Status: AC

## 2013-04-17 MED ORDER — BENZONATATE 100 MG PO CAPS: 100.0000 mg | ORAL_CAPSULE | Freq: Three times a day (TID) | ORAL | Status: DC | PRN

## 2013-04-17 MED ORDER — CEFTRIAXONE SODIUM 500 MG IJ SOLR
500.0000 mg | Freq: Once | INTRAMUSCULAR | Status: AC
  Administered 2013-04-17: 500 mg via INTRAMUSCULAR

## 2013-04-17 NOTE — Progress Notes (Signed)
  Subjective:    Patient ID: Nicholas [name], male    DOB: 12-17-1955, 57 y.o.   MRN: [id number]  HPI Pt in for fu of pneumonia and c/o not feeling much better, states he has ongoing fatigue die to excrsssive cough , which is productive of grey/green sputum , keeps  Him up a lot at night and he has persisitent chills with low grade undocumented fever States sugars have been elevated with the illness, fasting as high as in the 150's. Depression is less severe now that he is on medication, but remains an issue , esp in light of illness and ongoing weight gain, denies suicidal or homicidal ideation, no interest in therpay, staes for financial reasons   Review of Systems See HPI     Denies sinus pressure, c/o  nasal congestion, ear pain or sore throat.wit post nasal drainage which is foul tasting Denies  palpitations and leg swelling , c/o chest pain with cough . Denies abdominal pain, nausea, vomiting,diarrhea or constipation.   Denies dysuria, frequency, hesitancy or incontinence. Chronic joint pain , swelling and limitation in mobility. Denies headaches, seizures, numbness, or tingling. C/o thickened skin with rash, scaling and calluses on soles    Objective:   Physical Exam  Patient alert and oriented and in no cardiopulmonary distress.  HEENT: No facial asymmetry, EOMI, no sinus tenderness,  oropharynx pink and moist.  Neck decreased ROM, no adenopathy.  Chest: decreased air entry, though adequate with crackles in mid zone left lung, few wheezes  CVS: S1, S2 no murmurs, no S3.decreased DP bilaterally  ABD: Soft non tender. Bowel sounds normal.  Ext: No edema  MS: decreased  ROM spine, shoulders, hips and knees.  Skin: Intact, severe bilateral tinea pedis and onychomycosis with calluses on both feet  Psych: Good eye contact, normal affect. Memory intact not anxious or depressed appearing.  CNS: CN 2-12 intact, power,normal throughout.       Assessment & Plan:

## 2013-04-17 NOTE — Patient Instructions (Addendum)
F/u in 5 weeks, call if not improving please  Rocephin 500mg  IM in office   CBc and diff stat this morning, sputum to be sent for culture and CXR today  Augmentin, decongestants, and cough suppressant have been sent to your pharmacy  When I see recent labs from Dr Fransico Him ,I will know if I  Can prescribe antifungal tablet for 4 months for you for fungal foot infection and let you know  You do need foot care and I strongly recommend testing of the circulation in your feet   Hope that you improve soon  Blood pressure in the office today is excellent

## 2013-04-20 LAB — RESPIRATORY CULTURE OR RESPIRATORY AND SPUTUM CULTURE
Gram Stain: NONE SEEN
Gram Stain: NONE SEEN
Organism ID, Bacteria: NORMAL

## 2013-05-01 ENCOUNTER — Encounter: Payer: Self-pay | Admitting: Internal Medicine

## 2013-05-02 ENCOUNTER — Other Ambulatory Visit: Payer: Self-pay | Admitting: Family Medicine

## 2013-05-03 ENCOUNTER — Ambulatory Visit: Payer: Medicare Other | Admitting: Family Medicine

## 2013-05-21 ENCOUNTER — Telehealth: Payer: Self-pay | Admitting: Family Medicine

## 2013-05-21 DIAGNOSIS — E11628 Type 2 diabetes mellitus with other skin complications: Secondary | ICD-10-CM | POA: Insufficient documentation

## 2013-05-21 DIAGNOSIS — B351 Tinea unguium: Secondary | ICD-10-CM | POA: Insufficient documentation

## 2013-05-21 NOTE — Assessment & Plan Note (Signed)
Pt needs help through podiatry services due to the condition of his feet. States however , that currently unable tpo afford to go due to high co pay. Advised hi strongly against attemping his own footcare and he will start oral anti fungal mas soon a his hepatic function is verified as normal

## 2013-05-21 NOTE — Assessment & Plan Note (Signed)
Severe bilateral involvemnent of toenails

## 2013-05-21 NOTE — Telephone Encounter (Signed)
Pls contact pt, let him know that I was able to review August labs from Dr Fransico Him and his liver function is normal, so he is able to take oral medication for his severe fungal toenail and foot infection safely. Pls hen send in the ordered terbinafine one daily for 4 months total, let him know name and duration of treatment ALSO, ps check wit endo, pls ask them to send a microalb if done in the past 12 months if you get it pls enter date in chart under health maintainance  So pink box will then go away. If he has had none pt needs to submit one to [facility] asap Pls let me know what is going on with the microalb

## 2013-05-21 NOTE — Assessment & Plan Note (Signed)
Unchanged, pt again becoming frustrated , not willing to pursue option of bypass surgery as yet Patient re-educated about  the importance of commitment to a  minimum of 150 minutes of exercise per week. The importance of healthy food choices with portion control discussed. Encouraged to start a food diary, count calories and to consider  joining a support group. Sample diet sheets offered. Goals set by the patient for the next several months.

## 2013-05-21 NOTE — Assessment & Plan Note (Signed)
Followed by endo and well controlled, may need microalb will need to check on this

## 2013-05-21 NOTE — Assessment & Plan Note (Signed)
Controlled, no change in medication DASH diet and commitment to daily physical activity for a minimum of 30 minutes discussed and encouraged, as a part of hypertension management. The importance of attaining a healthy weight is also discussed.  

## 2013-05-21 NOTE — Assessment & Plan Note (Addendum)
Improved with resumption of medication, but still behind where he has been in the past, some of this is because of weight gain. Not actively suicidal or homicidal

## 2013-05-21 NOTE — Assessment & Plan Note (Signed)
Pt continus to be very symptomatic despite recent antibiotic course. Lab and radiologic data to be obtained, including sputum c/s , additional antibiotic course prescribeed based on symptoms, also as pt is in high risk category for complications of lung infection as he is diabetic

## 2013-05-21 NOTE — Assessment & Plan Note (Signed)
Improved and controlled based on lab data from 01/2013 Hyperlipidemia:Low fat diet discussed and encouraged.  No med change

## 2013-05-23 NOTE — Telephone Encounter (Signed)
Sent to Dr. Fransico Him for microalb. Patient has appt on 11/26.  Will notfiy.

## 2013-05-24 ENCOUNTER — Encounter: Payer: Self-pay | Admitting: Family Medicine

## 2013-05-24 ENCOUNTER — Other Ambulatory Visit: Payer: Self-pay

## 2013-05-24 ENCOUNTER — Ambulatory Visit (INDEPENDENT_AMBULATORY_CARE_PROVIDER_SITE_OTHER): Payer: Medicare Other | Admitting: Family Medicine

## 2013-05-24 ENCOUNTER — Encounter (INDEPENDENT_AMBULATORY_CARE_PROVIDER_SITE_OTHER): Payer: Self-pay

## 2013-05-24 VITALS — BP 118/70 | HR 85 | Resp 16 | Ht 71.5 in | Wt >= 6400 oz

## 2013-05-24 DIAGNOSIS — E1121 Type 2 diabetes mellitus with diabetic nephropathy: Secondary | ICD-10-CM

## 2013-05-24 DIAGNOSIS — L84 Corns and callosities: Secondary | ICD-10-CM

## 2013-05-24 DIAGNOSIS — B351 Tinea unguium: Secondary | ICD-10-CM

## 2013-05-24 DIAGNOSIS — E11628 Type 2 diabetes mellitus with other skin complications: Secondary | ICD-10-CM

## 2013-05-24 DIAGNOSIS — E785 Hyperlipidemia, unspecified: Secondary | ICD-10-CM

## 2013-05-24 DIAGNOSIS — J9801 Acute bronchospasm: Secondary | ICD-10-CM

## 2013-05-24 DIAGNOSIS — N058 Unspecified nephritic syndrome with other morphologic changes: Secondary | ICD-10-CM

## 2013-05-24 DIAGNOSIS — F341 Dysthymic disorder: Secondary | ICD-10-CM

## 2013-05-24 DIAGNOSIS — E1169 Type 2 diabetes mellitus with other specified complication: Secondary | ICD-10-CM

## 2013-05-24 DIAGNOSIS — E1129 Type 2 diabetes mellitus with other diabetic kidney complication: Secondary | ICD-10-CM

## 2013-05-24 MED ORDER — TERBINAFINE HCL 250 MG PO TABS: 250.0000 mg | ORAL_TABLET | Freq: Every day | ORAL | Status: AC

## 2013-05-24 MED ORDER — ALBUTEROL SULFATE HFA 108 (90 BASE) MCG/ACT IN AERS: INHALATION_SPRAY | RESPIRATORY_TRACT | Status: DC

## 2013-05-24 NOTE — Progress Notes (Signed)
   Subjective:    Patient ID: Nicholas [name], male    DOB: 12/11/1955, 57 y.o.   MRN: [id number]  HPI The PT is here for follow up af pneumonia  and re-evaluation of chronic medical conditions, medication management and review of any available recent lab and radiology data.  Preventive health is updated, specifically  Cancer screening and Immunization.   Follows with endo re his diabetes which is adequately controlled.  Still unwilling to  "go through" seminar before direct discussion with surgeon re gastric bypass, I suspect anyhow , that he will need to lose weight  Before surgery is performed  . The PT denies any adverse reactions to current medications since the last visit.  States he is back in [place], and has started back at the [facility], but notes cough and shortness of breath on exposure to the chlorine, had not noted in the recent past, however with colder weather , may have been a problem in the past, will try albuterol pre exercise only. Desperate need of diabetic foot care but owes bills , will see if he can bbe referred to [facility] which will accept him      Review of Systems See HPI Denies recent fever or chills. Denies sinus pressure, nasal congestion, ear pain or sore throat.  Denies chest pains, palpitations and leg swelling Denies abdominal pain, nausea, vomiting,diarrhea or constipation.   Denies dysuria, frequency, hesitancy or incontinence. Chronic limitation in mobility primarily due to size and joint stiffness Denies headaches, seizures, numbness, or tingling. Denies uncontrolled depression, anxiety or insomnia.       Objective:   Physical Exam Patient alert and oriented and in no cardiopulmonary distress.  HEENT: No facial asymmetry, EOMI, no sinus tenderness,  oropharynx pink and moist.  Neck supple no adenopathy.  Chest: Clear to auscultation bilaterally.decreased though adequate air entry  CVS: S1, S2 no murmurs, no S3.  ABD: Soft non tender.  l.  Ext: No edema  MS: Adequate though reduced  ROM spine, shoulders, hips and knees.    Psych: Good eye contact, normal affect. Memory intact not anxious or depressed appearing.  CNS: CN 2-12 intact, power,  normal throughout.        Assessment & Plan:

## 2013-05-24 NOTE — Patient Instructions (Addendum)
F/u in 4 month, call if you need me before  Continue current dose of citalopram , I am glad  That you are doing much better.  Micraoalb from office today  New is albuterol inhaler to use before swimming to see if this helps th o reduce the cough, if no improvement, then consider another form of exercise    You will be contacted about referral both to podiatry and to see a surgeon re bypass surery  All the VERY BEST for the new year

## 2013-05-25 LAB — MICROALBUMIN / CREATININE URINE RATIO
Creatinine, Urine: 50.5 mg/dL
Microalb Creat Ratio: 87.5 mg/g — ABNORMAL HIGH (ref 0.0–30.0)
Microalb, Ur: 4.42 mg/dL — ABNORMAL HIGH (ref 0.00–1.89)

## 2013-05-26 ENCOUNTER — Telehealth: Payer: Self-pay | Admitting: Family Medicine

## 2013-05-26 NOTE — Assessment & Plan Note (Signed)
Uncontrolled Hyperlipidemia:Low fat diet discussed and encouraged.  Updated lab next visit if  Not done by endo

## 2013-05-26 NOTE — Assessment & Plan Note (Signed)
Unchanged. Patient re-educated about  the importance of commitment to a  minimum of 150 minutes of exercise per week. The importance of healthy food choices with portion control discussed. Encouraged to start a food diary, count calories and to consider  joining a support group. Sample diet sheets offered. Goals set by the patient for the next several months.    

## 2013-05-26 NOTE — Assessment & Plan Note (Signed)
Marked improvement, no changes in med

## 2013-05-26 NOTE — Telephone Encounter (Signed)
pls see if you  Can set up a podietry appt with Dr Kem Kays or [facility] office , ensure he will go to [place], before scheduling if needed, he reports financial issues in [place]. Also let him know  That he will need to go through general conference for weight loss before direct consultation with surgeon, (based on what I see in your last note)I think he will be unwilling to do this, but there is nothing that I can do about this since it the standard protocol

## 2013-05-26 NOTE — Assessment & Plan Note (Signed)
Followed by endocrinology, deterioration in hba1C to 7 per pt report Patient advised to reduce carb and sweets, commit to regular physical activity, take meds as prescribed, test blood as directed, and attempt to lose weight, to improve blood sugar control.

## 2013-05-26 NOTE — Assessment & Plan Note (Signed)
Trial of albuterol pre exercise

## 2013-05-26 NOTE — Assessment & Plan Note (Signed)
Will attempt appt with podiatry, he needs this

## 2013-06-02 ENCOUNTER — Other Ambulatory Visit: Payer: Self-pay | Admitting: Family Medicine

## 2013-07-02 ENCOUNTER — Other Ambulatory Visit: Payer: Self-pay | Admitting: Family Medicine

## 2013-07-24 ENCOUNTER — Ambulatory Visit (INDEPENDENT_AMBULATORY_CARE_PROVIDER_SITE_OTHER): Payer: Medicare HMO | Admitting: Family Medicine

## 2013-07-24 ENCOUNTER — Encounter: Payer: Self-pay | Admitting: Family Medicine

## 2013-07-24 ENCOUNTER — Encounter (INDEPENDENT_AMBULATORY_CARE_PROVIDER_SITE_OTHER): Payer: Self-pay

## 2013-07-24 VITALS — BP 124/70 | HR 72 | Temp 97.8°F | Resp 16 | Ht 71.5 in | Wt >= 6400 oz

## 2013-07-24 DIAGNOSIS — J208 Acute bronchitis due to other specified organisms: Secondary | ICD-10-CM

## 2013-07-24 DIAGNOSIS — M1990 Unspecified osteoarthritis, unspecified site: Secondary | ICD-10-CM | POA: Insufficient documentation

## 2013-07-24 DIAGNOSIS — J209 Acute bronchitis, unspecified: Secondary | ICD-10-CM

## 2013-07-24 DIAGNOSIS — M129 Arthropathy, unspecified: Secondary | ICD-10-CM

## 2013-07-24 DIAGNOSIS — F341 Dysthymic disorder: Secondary | ICD-10-CM

## 2013-07-24 DIAGNOSIS — J309 Allergic rhinitis, unspecified: Secondary | ICD-10-CM | POA: Insufficient documentation

## 2013-07-24 DIAGNOSIS — I10 Essential (primary) hypertension: Secondary | ICD-10-CM

## 2013-07-24 DIAGNOSIS — B9689 Other specified bacterial agents as the cause of diseases classified elsewhere: Secondary | ICD-10-CM | POA: Insufficient documentation

## 2013-07-24 MED ORDER — SULFAMETHOXAZOLE-TMP DS 800-160 MG PO TABS: 1.0000 | ORAL_TABLET | Freq: Two times a day (BID) | ORAL | Status: DC

## 2013-07-24 MED ORDER — CEFTRIAXONE SODIUM 500 MG IJ SOLR
500.0000 mg | Freq: Once | INTRAMUSCULAR | Status: AC
  Administered 2013-07-24: 500 mg via INTRAMUSCULAR

## 2013-07-24 MED ORDER — KETOROLAC TROMETHAMINE 60 MG/2ML IM SOLN
60.0000 mg | Freq: Once | INTRAMUSCULAR | Status: AC
  Administered 2013-07-24: 60 mg via INTRAMUSCULAR

## 2013-07-24 MED ORDER — FLUTICASONE PROPIONATE 50 MCG/ACT NA SUSP: 2.0000 | Freq: Every day | NASAL | Status: DC

## 2013-07-24 MED ORDER — METHYLPREDNISOLONE ACETATE 80 MG/ML IJ SUSP
80.0000 mg | Freq: Once | INTRAMUSCULAR | Status: AC
  Administered 2013-07-24: 80 mg via INTRAMUSCULAR

## 2013-07-24 MED ORDER — PREDNISONE 5 MG PO TABS: 5.0000 mg | ORAL_TABLET | Freq: Two times a day (BID) | ORAL | Status: AC

## 2013-07-24 NOTE — Assessment & Plan Note (Addendum)
Rocephin and antibiotics at home for 10 days, pt to call if no improvement or worsening symptoms wil order CXr and labs at that time

## 2013-07-24 NOTE — Patient Instructions (Signed)
F/u in 4.5 month, call if you need me before  You are being treated for acute bronchitis, allergic rhinitis and joint pains in fingers which is uncontrolled  Please call within the next week if you worsen.   Injections will be given in the office and medication is sent in as discussed  It is important that you exercise regularly at least 30 minutes 5 times a week. If you develop chest pain, have severe difficulty breathing, or feel very tired, stop exercising immediately and seek medical attention   A healthy diet is rich in fruit, vegetables and whole grains. Poultry fish, nuts and beans are a healthy choice for protein rather then red meat. A low sodium diet and drinking 64 ounces of water daily is generally recommended. Oils and sweet should be limited. Carbohydrates especially for those who are diabetic or overweight, should be limited to 34-45 gram per meal. It is important to eat on a regular schedule, at least 3 times daily. Snacks should be primarily fruits, vegetables or nuts.

## 2013-07-24 NOTE — Progress Notes (Signed)
   Subjective:    Patient ID: Nicholas [name], male    DOB: 11-21-1955, 58 y.o.   MRN: [id number]  HPI  5 day h/o excessive head congestion and sneezing, also started having  Cough and grey sputum, no documented fever or chills, but had prolonged respiratory infection and pneumonia last year C/o pain and stiffness in right thumb base with some triggering and generalized pain and swelling in hands, has benefited from injections in the past and requests same States blood sugars are doing fairly well , denies polyuria, polydipsia or blurred vision Followed by endo Depression is controlled , and he is again back at the gym as well as involved in programs at his [place] See HPI Denies chest pains, palpitations and leg swelling Denies abdominal pain, nausea, vomiting,diarrhea or constipation.   Denies dysuria, frequency, hesitancy or incontinence.  Denies headaches, seizures, numbness, or tingling. Denies uncontrolled depression, anxiety or insomnia. Denies skin break down or rash.        Objective:   Physical Exam Patient alert and oriented and in no cardiopulmonary distress.  HEENT: No facial asymmetry, EOMI ,watery erythematous conjunctiva, no sinus tenderness,  oropharynx pink and moist.  Neck supple no adenopathy.Nasal mucosa erythematous and edematous  Chest: Adequate air entry bilateral crackles, no wheezes  CVS: S1, S2 no murmurs, no S3.  ABD: Soft non tender. Bowel sounds normal.  Ext: No edema  MS: decreased  ROM spine, shoulders, hips and knees.Jointds of fingers are swollen and tender wit reduced mobility  Skin: Intact, no ulcerations or rash noted.  Psych: Good eye contact, normal affect. Memory intact not anxious or depressed appearing.  CNS: CN 2-12 intact, power, normal throughout.        Assessment & Plan:

## 2013-07-24 NOTE — Assessment & Plan Note (Addendum)
toradol and depo medrol in office due to incrased and uncontrolled bilateral hand pain, followed by a short course of prednisone

## 2013-07-26 ENCOUNTER — Other Ambulatory Visit: Payer: Self-pay | Admitting: Family Medicine

## 2013-07-31 NOTE — Assessment & Plan Note (Signed)
Unchanged. Patient re-educated about  the importance of commitment to a  minimum of 150 minutes of exercise per week. The importance of healthy food choices with portion control discussed. Encouraged to start a food diary, count calories and to consider  joining a support group. Sample diet sheets offered. Goals set by the patient for the next several months.    

## 2013-07-31 NOTE — Assessment & Plan Note (Signed)
Currently controlled on medication, no change at this time

## 2013-07-31 NOTE — Assessment & Plan Note (Signed)
Uncontrolled , depo medrol in office followed by short course of prednisone °

## 2013-07-31 NOTE — Assessment & Plan Note (Signed)
Controlled, no change in medication DASH diet and commitment to daily physical activity for a minimum of 30 minutes discussed and encouraged, as a part of hypertension management. The importance of attaining a healthy weight is also discussed.  

## 2013-08-10 ENCOUNTER — Other Ambulatory Visit: Payer: Self-pay | Admitting: Family Medicine

## 2013-08-14 ENCOUNTER — Other Ambulatory Visit: Payer: Self-pay | Admitting: Family Medicine

## 2013-08-25 ENCOUNTER — Other Ambulatory Visit: Payer: Self-pay | Admitting: Family Medicine

## 2013-08-30 ENCOUNTER — Ambulatory Visit (HOSPITAL_COMMUNITY)
Admission: RE | Admit: 2013-08-30 | Discharge: 2013-08-30 | Disposition: A | Discharge status: Home / Self Care | Payer: Medicare HMO | Source: Ambulatory Visit | Attending: Family Medicine | Admitting: Family Medicine

## 2013-08-30 ENCOUNTER — Ambulatory Visit (INDEPENDENT_AMBULATORY_CARE_PROVIDER_SITE_OTHER): Payer: Commercial Managed Care - HMO | Admitting: Family Medicine

## 2013-08-30 ENCOUNTER — Encounter (INDEPENDENT_AMBULATORY_CARE_PROVIDER_SITE_OTHER): Payer: Self-pay

## 2013-08-30 VITALS — BP 120/70 | HR 91 | Temp 98.3°F | Resp 16 | Ht 72.0 in | Wt >= 6400 oz

## 2013-08-30 DIAGNOSIS — A499 Bacterial infection, unspecified: Secondary | ICD-10-CM

## 2013-08-30 DIAGNOSIS — R05 Cough: Secondary | ICD-10-CM | POA: Insufficient documentation

## 2013-08-30 DIAGNOSIS — B9689 Other specified bacterial agents as the cause of diseases classified elsewhere: Secondary | ICD-10-CM

## 2013-08-30 DIAGNOSIS — E1121 Type 2 diabetes mellitus with diabetic nephropathy: Secondary | ICD-10-CM

## 2013-08-30 DIAGNOSIS — R059 Cough, unspecified: Secondary | ICD-10-CM | POA: Insufficient documentation

## 2013-08-30 DIAGNOSIS — J209 Acute bronchitis, unspecified: Secondary | ICD-10-CM

## 2013-08-30 DIAGNOSIS — M25562 Pain in left knee: Secondary | ICD-10-CM

## 2013-08-30 DIAGNOSIS — N058 Unspecified nephritic syndrome with other morphologic changes: Secondary | ICD-10-CM

## 2013-08-30 DIAGNOSIS — M25569 Pain in unspecified knee: Secondary | ICD-10-CM

## 2013-08-30 DIAGNOSIS — J208 Acute bronchitis due to other specified organisms: Secondary | ICD-10-CM

## 2013-08-30 DIAGNOSIS — E1129 Type 2 diabetes mellitus with other diabetic kidney complication: Secondary | ICD-10-CM

## 2013-08-30 DIAGNOSIS — F341 Dysthymic disorder: Secondary | ICD-10-CM

## 2013-08-30 DIAGNOSIS — D72829 Elevated white blood cell count, unspecified: Secondary | ICD-10-CM

## 2013-08-30 MED ORDER — CITALOPRAM HYDROBROMIDE 40 MG PO TABS: 40.0000 mg | ORAL_TABLET | Freq: Every day | ORAL | Status: DC

## 2013-08-30 MED ORDER — MELOXICAM 15 MG PO TABS: 15.0000 mg | ORAL_TABLET | Freq: Every day | ORAL | Status: DC

## 2013-08-30 MED ORDER — BENZONATATE 100 MG PO CAPS: 100.0000 mg | ORAL_CAPSULE | Freq: Two times a day (BID) | ORAL | Status: DC | PRN

## 2013-08-30 MED ORDER — TRAMADOL HCL 50 MG PO TABS: ORAL_TABLET | ORAL | Status: DC

## 2013-08-30 MED ORDER — PENICILLIN V POTASSIUM 500 MG PO TABS: 500.0000 mg | ORAL_TABLET | Freq: Three times a day (TID) | ORAL | Status: DC

## 2013-08-30 NOTE — Patient Instructions (Signed)
F/u end April  cXR today and CBC and diff also sputum for c/s ifable  Decongestant and penicillin prescribed  Increase citalopram for depression to 40mg  one daily  Toradol in office for left knee pain, new is daily meloxicam, and tramadol one daily, take tylenol 325 mg one daily with the tramdol  [facility] you improve , call if not

## 2013-08-31 ENCOUNTER — Telehealth: Payer: Self-pay | Admitting: Family Medicine

## 2013-08-31 LAB — CBC WITH DIFFERENTIAL/PLATELET
Basophils Absolute: 0.2 10*3/uL — ABNORMAL HIGH (ref 0.0–0.1)
Basophils Relative: 1 % (ref 0–1)
EOS ABS: 0.3 10*3/uL (ref 0.0–0.7)
EOS PCT: 2 % (ref 0–5)
HCT: 44.7 % (ref 39.0–52.0)
HEMOGLOBIN: 15 g/dL (ref 13.0–17.0)
LYMPHS ABS: 2.9 10*3/uL (ref 0.7–4.0)
Lymphocytes Relative: 18 % (ref 12–46)
MCH: 29.4 pg (ref 26.0–34.0)
MCHC: 33.6 g/dL (ref 30.0–36.0)
MCV: 87.6 fL (ref 78.0–100.0)
MONO ABS: 1.8 10*3/uL — AB (ref 0.1–1.0)
MONOS PCT: 11 % (ref 3–12)
Neutro Abs: 11 10*3/uL — ABNORMAL HIGH (ref 1.7–7.7)
Neutrophils Relative %: 68 % (ref 43–77)
Platelets: 319 10*3/uL (ref 150–400)
RBC: 5.1 MIL/uL (ref 4.22–5.81)
RDW: 15.5 % (ref 11.5–15.5)
WBC: 16.2 10*3/uL — ABNORMAL HIGH (ref 4.0–10.5)

## 2013-08-31 MED ORDER — KETOROLAC TROMETHAMINE 30 MG/ML IJ SOLN
60.0000 mg | Freq: Once | INTRAMUSCULAR | Status: AC
  Administered 2013-08-30: 60 mg via INTRAMUSCULAR

## 2013-09-01 ENCOUNTER — Ambulatory Visit (INDEPENDENT_AMBULATORY_CARE_PROVIDER_SITE_OTHER): Payer: Medicare HMO

## 2013-09-01 ENCOUNTER — Encounter (INDEPENDENT_AMBULATORY_CARE_PROVIDER_SITE_OTHER): Payer: Self-pay

## 2013-09-01 VITALS — BP 122/74

## 2013-09-01 DIAGNOSIS — D72829 Elevated white blood cell count, unspecified: Secondary | ICD-10-CM

## 2013-09-01 NOTE — Telephone Encounter (Signed)
Went over his discharge instructions and he understands how to take his meds

## 2013-09-03 LAB — RESPIRATORY CULTURE OR RESPIRATORY AND SPUTUM CULTURE
GRAM STAIN: NONE SEEN
Organism ID, Bacteria: NORMAL

## 2013-09-04 DIAGNOSIS — D72829 Elevated white blood cell count, unspecified: Secondary | ICD-10-CM

## 2013-09-04 LAB — CBC WITH DIFFERENTIAL/PLATELET
Basophils Absolute: 0 10*3/uL (ref 0.0–0.1)
Basophils Relative: 0 % (ref 0–1)
EOS ABS: 0.3 10*3/uL (ref 0.0–0.7)
Eosinophils Relative: 2 % (ref 0–5)
HCT: 46 % (ref 39.0–52.0)
Hemoglobin: 14.9 g/dL (ref 13.0–17.0)
Lymphocytes Relative: 18 % (ref 12–46)
Lymphs Abs: 2.4 10*3/uL (ref 0.7–4.0)
MCH: 29.1 pg (ref 26.0–34.0)
MCHC: 32.4 g/dL (ref 30.0–36.0)
MCV: 89.8 fL (ref 78.0–100.0)
Monocytes Absolute: 1.1 10*3/uL — ABNORMAL HIGH (ref 0.1–1.0)
Monocytes Relative: 8 % (ref 3–12)
NEUTROS PCT: 72 % (ref 43–77)
Neutro Abs: 9.5 10*3/uL — ABNORMAL HIGH (ref 1.7–7.7)
PLATELETS: 293 10*3/uL (ref 150–400)
RBC: 5.12 MIL/uL (ref 4.22–5.81)
RDW: 14.7 % (ref 11.5–15.5)
WBC: 13.2 10*3/uL — AB (ref 4.0–10.5)

## 2013-09-04 MED ORDER — CEFTRIAXONE SODIUM 1 G IJ SOLR
500.0000 mg | Freq: Once | INTRAMUSCULAR | Status: AC
  Administered 2013-09-04: 500 mg via INTRAMUSCULAR

## 2013-09-04 NOTE — Progress Notes (Signed)
Patient in for rocephin injection.  Injection given IM.  Patient aware that he should have stat labs drawn.

## 2013-09-08 ENCOUNTER — Telehealth: Payer: Self-pay

## 2013-09-08 ENCOUNTER — Ambulatory Visit (HOSPITAL_COMMUNITY)
Admission: RE | Admit: 2013-09-08 | Discharge: 2013-09-08 | Disposition: A | Discharge status: Home / Self Care | Payer: Medicare HMO | Source: Ambulatory Visit | Attending: Family Medicine | Admitting: Family Medicine

## 2013-09-08 DIAGNOSIS — R0982 Postnasal drip: Secondary | ICD-10-CM | POA: Insufficient documentation

## 2013-09-08 DIAGNOSIS — B9689 Other specified bacterial agents as the cause of diseases classified elsewhere: Secondary | ICD-10-CM

## 2013-09-08 DIAGNOSIS — J329 Chronic sinusitis, unspecified: Secondary | ICD-10-CM

## 2013-09-08 DIAGNOSIS — J208 Acute bronchitis due to other specified organisms: Secondary | ICD-10-CM

## 2013-09-08 LAB — CBC WITH DIFFERENTIAL/PLATELET
BASOS ABS: 0.1 10*3/uL (ref 0.0–0.1)
Basophils Relative: 1 % (ref 0–1)
Eosinophils Absolute: 0.4 10*3/uL (ref 0.0–0.7)
Eosinophils Relative: 3 % (ref 0–5)
HEMATOCRIT: 47.1 % (ref 39.0–52.0)
Hemoglobin: 15.2 g/dL (ref 13.0–17.0)
LYMPHS PCT: 19 % (ref 12–46)
Lymphs Abs: 2.8 10*3/uL (ref 0.7–4.0)
MCH: 29.7 pg (ref 26.0–34.0)
MCHC: 32.3 g/dL (ref 30.0–36.0)
MCV: 92.2 fL (ref 78.0–100.0)
MONO ABS: 1.2 10*3/uL — AB (ref 0.1–1.0)
Monocytes Relative: 8 % (ref 3–12)
Neutro Abs: 10.3 10*3/uL — ABNORMAL HIGH (ref 1.7–7.7)
Neutrophils Relative %: 69 % (ref 43–77)
PLATELETS: 268 10*3/uL (ref 150–400)
RBC: 5.11 MIL/uL (ref 4.22–5.81)
RDW: 14.8 % (ref 11.5–15.5)
WBC: 14.9 10*3/uL — ABNORMAL HIGH (ref 4.0–10.5)

## 2013-09-08 LAB — BASIC METABOLIC PANEL
BUN: 25 mg/dL — ABNORMAL HIGH (ref 6–23)
CO2: 30 mEq/L (ref 19–32)
CREATININE: 1.15 mg/dL (ref 0.50–1.35)
Calcium: 9.8 mg/dL (ref 8.4–10.5)
Chloride: 98 mEq/L (ref 96–112)
Glucose, Bld: 338 mg/dL — ABNORMAL HIGH (ref 70–99)
Potassium: 5.3 mEq/L (ref 3.5–5.3)
SODIUM: 140 meq/L (ref 135–145)

## 2013-09-08 MED ORDER — FIRST-DUKES MOUTHWASH MT SUSP: OROMUCOSAL | Status: DC

## 2013-09-08 MED ORDER — OSELTAMIVIR PHOSPHATE 75 MG PO CAPS: 75.0000 mg | ORAL_CAPSULE | Freq: Two times a day (BID) | ORAL | Status: DC

## 2013-09-08 MED ORDER — PROMETHAZINE-DM 6.25-15 MG/5ML PO SYRP: 5.0000 mL | ORAL_SOLUTION | Freq: Every day | ORAL | Status: DC

## 2013-09-08 MED ORDER — LEVOFLOXACIN 750 MG PO TABS: 750.0000 mg | ORAL_TABLET | Freq: Every day | ORAL | Status: DC

## 2013-09-08 NOTE — Telephone Encounter (Signed)
Labs ordered and meds sent. Will call Dr when stat results are available

## 2013-09-08 NOTE — Telephone Encounter (Signed)
Still taking Penicillin and tessalon. 3 days left on antibiotic. Still coughing up yellow phlegm (coughing so hard and forceful at night he can't sleep) sneezing and sore throat. Doesn't feel like the penicillin is helping. Wants something else to help him. Please advise

## 2013-09-08 NOTE — Telephone Encounter (Signed)
pls order cbc and diff and chem 7 stat, pls erx levaquin 750 mg one daily #10  tamiflu one twice daily #10 and also phenergan DM one teaspoon at bedtime as needed x170ml refill zero, dukes magic mouthwashh 100cc three time daily garglr , swish and swallow x 300cc  Also order sinus xray complete, (I will do this)  He is to be contacted re lab results today. Pls call me with them and I will let you know what to do, if WBC is 15 or more with a shift I will send him to the ED  His contact # is his cell phone [phone]

## 2013-09-08 NOTE — Telephone Encounter (Signed)
Per Dr. Electa Sniff him to take meds prescribed today and if feeling any worse to go to the ED. Appt scheduled for Tuesday.

## 2013-09-11 ENCOUNTER — Encounter (INDEPENDENT_AMBULATORY_CARE_PROVIDER_SITE_OTHER): Payer: Self-pay

## 2013-09-11 ENCOUNTER — Ambulatory Visit (INDEPENDENT_AMBULATORY_CARE_PROVIDER_SITE_OTHER): Payer: Medicare HMO

## 2013-09-11 VITALS — BP 120/70 | Wt >= 6400 oz

## 2013-09-11 DIAGNOSIS — J329 Chronic sinusitis, unspecified: Secondary | ICD-10-CM

## 2013-09-11 MED ORDER — CEFTRIAXONE SODIUM 500 MG IJ SOLR
500.0000 mg | Freq: Once | INTRAMUSCULAR | Status: AC
  Administered 2013-09-11: 500 mg via INTRAMUSCULAR

## 2013-09-11 NOTE — Progress Notes (Signed)
Patient in to get IM rocephin 500mg  daily for 5 days for sinusitis

## 2013-09-12 ENCOUNTER — Ambulatory Visit: Payer: Medicare HMO | Admitting: Family Medicine

## 2013-09-12 ENCOUNTER — Ambulatory Visit (INDEPENDENT_AMBULATORY_CARE_PROVIDER_SITE_OTHER): Payer: Medicare HMO

## 2013-09-12 VITALS — BP 138/74 | Wt >= 6400 oz

## 2013-09-12 DIAGNOSIS — J329 Chronic sinusitis, unspecified: Secondary | ICD-10-CM

## 2013-09-12 MED ORDER — CEFTRIAXONE SODIUM 1 G IJ SOLR
500.0000 mg | Freq: Once | INTRAMUSCULAR | Status: AC
  Administered 2013-09-12: 500 mg via INTRAMUSCULAR

## 2013-09-12 NOTE — Progress Notes (Signed)
Patient in for #2 in series of 5 of Rocephin injections.  Injection given IM in right gluteal.  No sign or symptom of adverse reaction.  No voiced complaints.

## 2013-09-13 ENCOUNTER — Ambulatory Visit: Payer: Medicare Other | Admitting: Family Medicine

## 2013-09-13 ENCOUNTER — Ambulatory Visit (INDEPENDENT_AMBULATORY_CARE_PROVIDER_SITE_OTHER): Payer: Medicare HMO

## 2013-09-13 VITALS — BP 126/76 | Wt >= 6400 oz

## 2013-09-13 DIAGNOSIS — J329 Chronic sinusitis, unspecified: Secondary | ICD-10-CM

## 2013-09-13 MED ORDER — CEFTRIAXONE SODIUM 1 G IJ SOLR
500.0000 mg | Freq: Once | INTRAMUSCULAR | Status: AC
  Administered 2013-09-13: 500 mg via INTRAMUSCULAR

## 2013-09-13 NOTE — Progress Notes (Signed)
Patient in for injection #4 in series of 5.  Rocephin given Im in left gluteal.  No signs or symptoms of adverse reaction.  No voiced complaints.  Patient will return in the am for next injection.

## 2013-09-14 ENCOUNTER — Ambulatory Visit (INDEPENDENT_AMBULATORY_CARE_PROVIDER_SITE_OTHER): Payer: Medicare HMO

## 2013-09-14 VITALS — BP 120/70 | Wt >= 6400 oz

## 2013-09-14 DIAGNOSIS — J329 Chronic sinusitis, unspecified: Secondary | ICD-10-CM

## 2013-09-14 MED ORDER — CEFTRIAXONE SODIUM 500 MG IJ SOLR
500.0000 mg | Freq: Once | INTRAMUSCULAR | Status: AC
  Administered 2013-09-14: 500 mg via INTRAMUSCULAR

## 2013-09-14 NOTE — Progress Notes (Signed)
Patient received IM rocephin 500mg  per Dr in [place] with no complications

## 2013-09-15 ENCOUNTER — Ambulatory Visit: Payer: Medicare HMO

## 2013-10-05 ENCOUNTER — Other Ambulatory Visit: Payer: Self-pay

## 2013-10-11 ENCOUNTER — Other Ambulatory Visit: Payer: Self-pay | Admitting: Family Medicine

## 2013-11-06 ENCOUNTER — Telehealth: Payer: Self-pay | Admitting: Family Medicine

## 2013-11-06 ENCOUNTER — Other Ambulatory Visit: Payer: Self-pay | Admitting: Family Medicine

## 2013-11-06 DIAGNOSIS — I739 Peripheral vascular disease, unspecified: Secondary | ICD-10-CM

## 2013-11-06 NOTE — Telephone Encounter (Signed)
Do you agree with advising patient to gargle with salt water for mouth.  And monitor for leg?

## 2013-11-07 NOTE — Telephone Encounter (Signed)
Yes I do , and since pt requests/suggests wound center for leg refer to [facility], he has chronic leg stasis

## 2013-11-07 NOTE — Telephone Encounter (Signed)
Patient aware and referred to wound center in [place]

## 2013-11-07 NOTE — Addendum Note (Signed)
Addended by: Denman George B on: 11/07/2013 12:18 PM   Modules accepted: Orders

## 2013-11-09 NOTE — Telephone Encounter (Signed)
Noted, thanks!

## 2013-11-13 ENCOUNTER — Telehealth: Payer: Self-pay

## 2013-11-13 MED ORDER — NYSTATIN 100000 UNIT/GM EX POWD
CUTANEOUS | Status: DC
Start: 2013-11-13 — End: 2014-03-01

## 2013-11-13 MED ORDER — FLUCONAZOLE 150 MG PO TABS: ORAL_TABLET | ORAL | Status: DC

## 2013-11-13 NOTE — Addendum Note (Signed)
Addended by: Denman George B on: 11/13/2013 10:46 AM   Modules accepted: Orders

## 2013-11-13 NOTE — Telephone Encounter (Signed)
Patient aware and meds sent to requested pharmacy.

## 2013-11-13 NOTE — Telephone Encounter (Signed)
pls erx and adbvise fluconaziole 150mg  once daily #5 and mycostatin powder twice daily for 10 days , then as needed

## 2013-11-16 ENCOUNTER — Telehealth: Payer: Self-pay | Admitting: Family Medicine

## 2013-11-16 NOTE — Telephone Encounter (Signed)
Noted  

## 2013-11-21 ENCOUNTER — Telehealth: Payer: Self-pay

## 2013-11-21 MED ORDER — NYSTATIN-TRIAMCINOLONE 100000-0.1 UNIT/GM-% EX OINT: 1.0000 "application " | TOPICAL_OINTMENT | Freq: Two times a day (BID) | CUTANEOUS | Status: DC

## 2013-11-21 NOTE — Telephone Encounter (Signed)
Med changed to cream form

## 2013-11-28 ENCOUNTER — Encounter: Payer: Self-pay | Admitting: Family Medicine

## 2013-11-28 ENCOUNTER — Other Ambulatory Visit: Payer: Self-pay | Admitting: Family Medicine

## 2013-11-28 ENCOUNTER — Ambulatory Visit (INDEPENDENT_AMBULATORY_CARE_PROVIDER_SITE_OTHER): Payer: Medicare HMO | Admitting: Family Medicine

## 2013-11-28 VITALS — BP 118/76 | HR 73 | Resp 16 | Ht 72.0 in | Wt >= 6400 oz

## 2013-11-28 DIAGNOSIS — N058 Unspecified nephritic syndrome with other morphologic changes: Secondary | ICD-10-CM

## 2013-11-28 DIAGNOSIS — B379 Candidiasis, unspecified: Secondary | ICD-10-CM | POA: Insufficient documentation

## 2013-11-28 DIAGNOSIS — E1121 Type 2 diabetes mellitus with diabetic nephropathy: Secondary | ICD-10-CM

## 2013-11-28 DIAGNOSIS — F32A Depression, unspecified: Secondary | ICD-10-CM

## 2013-11-28 DIAGNOSIS — R5381 Other malaise: Secondary | ICD-10-CM

## 2013-11-28 DIAGNOSIS — E785 Hyperlipidemia, unspecified: Secondary | ICD-10-CM

## 2013-11-28 DIAGNOSIS — E291 Testicular hypofunction: Secondary | ICD-10-CM

## 2013-11-28 DIAGNOSIS — Z113 Encounter for screening for infections with a predominantly sexual mode of transmission: Secondary | ICD-10-CM

## 2013-11-28 DIAGNOSIS — I10 Essential (primary) hypertension: Secondary | ICD-10-CM

## 2013-11-28 DIAGNOSIS — F3289 Other specified depressive episodes: Secondary | ICD-10-CM

## 2013-11-28 DIAGNOSIS — B369 Superficial mycosis, unspecified: Secondary | ICD-10-CM

## 2013-11-28 DIAGNOSIS — R5383 Other fatigue: Secondary | ICD-10-CM

## 2013-11-28 DIAGNOSIS — F329 Major depressive disorder, single episode, unspecified: Secondary | ICD-10-CM

## 2013-11-28 DIAGNOSIS — E1129 Type 2 diabetes mellitus with other diabetic kidney complication: Secondary | ICD-10-CM

## 2013-11-28 MED ORDER — FLUCONAZOLE 150 MG PO TABS: 150.0000 mg | ORAL_TABLET | Freq: Once | ORAL | Status: DC

## 2013-11-28 MED ORDER — CLOTRIMAZOLE-BETAMETHASONE 1-0.05 % EX CREA: TOPICAL_CREAM | CUTANEOUS | Status: DC

## 2013-11-28 NOTE — Patient Instructions (Signed)
F/u in 4 month, call i if you need me before  RPR and CBc and diff today   Fluconazole is sent in for fungal infection

## 2013-11-29 LAB — CBC
HEMATOCRIT: 42.5 % (ref 39.0–52.0)
Hemoglobin: 14 g/dL (ref 13.0–17.0)
MCH: 29.4 pg (ref 26.0–34.0)
MCHC: 32.9 g/dL (ref 30.0–36.0)
MCV: 89.1 fL (ref 78.0–100.0)
Platelets: 211 10*3/uL (ref 150–400)
RBC: 4.77 MIL/uL (ref 4.22–5.81)
RDW: 16 % — ABNORMAL HIGH (ref 11.5–15.5)
WBC: 8.8 10*3/uL (ref 4.0–10.5)

## 2013-11-29 LAB — HSV 2 ANTIBODY, IGG: HSV 2 Glycoprotein G Ab, IgG: <0.1 IV

## 2013-12-01 ENCOUNTER — Other Ambulatory Visit: Payer: Self-pay | Admitting: Family Medicine

## 2013-12-01 ENCOUNTER — Telehealth: Payer: Self-pay | Admitting: *Deleted

## 2013-12-01 NOTE — Telephone Encounter (Signed)
Pt called and LMOM about his blood work results, pt would like to know if they have came back yet. Please advise

## 2013-12-01 NOTE — Telephone Encounter (Signed)
Patient aware of recent lab results

## 2013-12-07 ENCOUNTER — Other Ambulatory Visit: Payer: Self-pay | Admitting: Family Medicine

## 2013-12-13 ENCOUNTER — Ambulatory Visit: Payer: Medicare HMO | Admitting: Family Medicine

## 2013-12-27 ENCOUNTER — Other Ambulatory Visit: Payer: Self-pay | Admitting: Family Medicine

## 2014-01-03 LAB — HM DIABETES EYE EXAM

## 2014-01-05 ENCOUNTER — Other Ambulatory Visit: Payer: Self-pay | Admitting: Family Medicine

## 2014-01-09 ENCOUNTER — Ambulatory Visit (INDEPENDENT_AMBULATORY_CARE_PROVIDER_SITE_OTHER): Payer: Medicare HMO | Admitting: Family Medicine

## 2014-01-09 ENCOUNTER — Encounter: Payer: Self-pay | Admitting: Family Medicine

## 2014-01-09 ENCOUNTER — Other Ambulatory Visit: Payer: Self-pay | Admitting: Family Medicine

## 2014-01-09 VITALS — BP 118/70 | HR 71 | Temp 98.3°F | Resp 16 | Ht 72.0 in | Wt >= 6400 oz

## 2014-01-09 DIAGNOSIS — R1084 Generalized abdominal pain: Secondary | ICD-10-CM

## 2014-01-09 DIAGNOSIS — R0789 Other chest pain: Secondary | ICD-10-CM

## 2014-01-09 DIAGNOSIS — N058 Unspecified nephritic syndrome with other morphologic changes: Secondary | ICD-10-CM

## 2014-01-09 DIAGNOSIS — M549 Dorsalgia, unspecified: Secondary | ICD-10-CM

## 2014-01-09 DIAGNOSIS — E1129 Type 2 diabetes mellitus with other diabetic kidney complication: Secondary | ICD-10-CM

## 2014-01-09 DIAGNOSIS — M546 Pain in thoracic spine: Secondary | ICD-10-CM

## 2014-01-09 DIAGNOSIS — E1121 Type 2 diabetes mellitus with diabetic nephropathy: Secondary | ICD-10-CM

## 2014-01-09 DIAGNOSIS — E291 Testicular hypofunction: Secondary | ICD-10-CM

## 2014-01-09 DIAGNOSIS — I10 Essential (primary) hypertension: Secondary | ICD-10-CM

## 2014-01-09 MED ORDER — HYDROCODONE-ACETAMINOPHEN 5-325 MG PO TABS: 1.0000 | ORAL_TABLET | Freq: Every day | ORAL | Status: DC

## 2014-01-09 NOTE — Progress Notes (Signed)
Subjective:    Patient ID: Nicholas [name], male    DOB: [birth year], 58 y.o.   MRN: [id number]  HPI The PT is here for follow up and re-evaluation of chronic medical conditions, medication management and review of any available recent lab and radiology data.  Preventive health is updated, specifically  Cancer screening and Immunization.   . The PT denies any adverse reactions to current medications since the last visit.  3 week h/o abdomninal pain has upcoming court case Back pain is worse, tramdol not helping up to a 5 or 6 pain at 8 or 9       Review of Systems See HPI Denies recent fever or chills. Denies sinus pressure, nasal congestion, ear pain or sore throat. Denies chest congestion, productive cough or wheezing. Denies chest pains, palpitations and leg swelling    Denies dysuria, frequency, hesitancy or incontinence.  Denies headaches, seizures, numbness, or tingling. Denies uncontrolled  depression, anxiety or insomnia. Denies skin break down or rash.        Objective:   Physical Exam BP 118/70 mmHg  Pulse 71  Temp(Src) 98.3 F (36.8 C) (Oral)  Resp 16  Ht 6' (1.829 m)  Wt 445 lb 6.4 oz (202.032 kg)  BMI 60.39 kg/m2  SpO2 94% Patient alert and oriented and in no cardiopulmonary distress.  HEENT: No facial asymmetry, EOMI,   oropharynx pink and moist.  Neck supple no JVD, no mass.  Chest: Clear to auscultation bilaterally.No reproducible chest wall tenderness  CVS: S1, S2 no murmurs, no S3.Regular rate.  ABD: Soft non tender. No guarding or rebound, obese  Ext: No edema  MS: Reduced  ROM spine, shoulders, hips and knees.  Skin: Intact, no ulcerations or rash noted.  Psych: Good eye contact, flat  affect. Memory intact not anxious or depressed appearing.  CNS: CN 2-12 intact, power,  normal throughout.no focal deficits noted.        Assessment & Plan:  Hypogonadism in male Chronic fatigue with low energy level, refer to urology for eval  and manaegemnt   Abdominal pain Refer to gI for furhter eval, pt has h/o colon polyps   Atypical chest pain EKG in office shows normal sinus nrythm with no ischemic changes, despite multiple CV risk factors   Back pain with radiation Uncontrolled, pt to d/c meloxicam, he will start once daily hydrocodone , pain contract signed at [place] loss encouraged which is a major source of frustration for him   Hypertension goal BP (blood pressure) < 130/80 Controlled, no change in medication DASH diet and commitment to daily physical activity for a minimum of 30 minutes discussed and encouraged, as a part of hypertension management. The importance of attaining a healthy weight is also discussed.    Type 2 diabetes mellitus with diabetic nephropathy Uncontrolled, treated by endo, recent deterioration in control and over all health, encoyuraged pt to again re commit to improving his health Patient advised to reduce carb and sweets, commit to regular physical activity, take meds as prescribed, test blood as directed, and attempt to lose weight, to improve blood sugar control.    MORBID OBESITY Deteriorated. Patient re-educated about  the importance of commitment to a  minimum of 150 minutes of exercise per week. The importance of healthy food choices with portion control discussed. Encouraged to start a food diary, count calories and to consider  joining a support group. Sample diet sheets offered. Goals set by the patient for the next several months.

## 2014-01-09 NOTE — Patient Instructions (Addendum)
Annual wellness in 4 month, call if you need me before  You are referred to urology for eval of low testosterone  You are referred to Dr Sydell Axon for eval of abdominal pain  Stop meloxicam and tramadol , one hydrocone at bedtime for back pain and sign pain contract today  Hope you start feeling better soon  EKG shows no sign of heart damage

## 2014-01-16 ENCOUNTER — Encounter: Payer: Self-pay | Admitting: Internal Medicine

## 2014-01-22 ENCOUNTER — Encounter: Payer: Self-pay | Admitting: Family Medicine

## 2014-01-22 ENCOUNTER — Telehealth: Payer: Self-pay | Admitting: Family Medicine

## 2014-01-22 DIAGNOSIS — M549 Dorsalgia, unspecified: Secondary | ICD-10-CM

## 2014-01-22 NOTE — Assessment & Plan Note (Signed)
Chronic fatigue with low energy level, refer to urology for eval and manaegemnt

## 2014-01-22 NOTE — Telephone Encounter (Signed)
Pls cal pt, establish specific part of the back which hurts upper , mid or low back, document what he rates pain at  And is it radiatng  Pls order xray of appropriate part of back, also refer to Dr Merlene Laughter for pain mx (based on his weight , MRI is not a likely option) Dr Merlene Laughter was treating him for chronic pain in the  Past  (I recommend adding gabapentin 300mg  one daily for pain mx , if he afrees, pls send in #30 refill 1   ??? pls ask

## 2014-01-23 ENCOUNTER — Ambulatory Visit (HOSPITAL_COMMUNITY)
Admission: RE | Admit: 2014-01-23 | Discharge: 2014-01-23 | Disposition: A | Discharge status: Home / Self Care | Payer: Medicare HMO | Source: Ambulatory Visit | Attending: Family Medicine | Admitting: Family Medicine

## 2014-01-23 DIAGNOSIS — J189 Pneumonia, unspecified organism: Secondary | ICD-10-CM

## 2014-01-23 MED ORDER — GABAPENTIN 300 MG PO CAPS: 300.0000 mg | ORAL_CAPSULE | Freq: Every day | ORAL | Status: DC

## 2014-01-23 NOTE — Telephone Encounter (Signed)
Gabapentin sent per Dr and Odette Horns ordered. Patient aware

## 2014-01-23 NOTE — Telephone Encounter (Signed)
Spoke with patient.  He states that he is having lower back pain that radiates down left leg.  Is open to trying Gabapentin.  Does not want to go through with referral for pain management.  States that he has recently been taking more of Hydrocodone that what is prescribed to cope with the pain.  Xray placed.

## 2014-01-23 NOTE — Addendum Note (Signed)
Addended by: Denman George B on: 01/23/2014 11:15 AM   Modules accepted: Orders

## 2014-01-23 NOTE — Addendum Note (Signed)
Addended by: Denman George B on: 01/23/2014 04:26 PM   Modules accepted: Orders

## 2014-01-25 ENCOUNTER — Other Ambulatory Visit: Payer: Self-pay | Admitting: Family Medicine

## 2014-02-08 ENCOUNTER — Other Ambulatory Visit: Payer: Self-pay | Admitting: Family Medicine

## 2014-02-12 ENCOUNTER — Other Ambulatory Visit: Payer: Self-pay | Admitting: Family Medicine

## 2014-02-21 ENCOUNTER — Encounter: Payer: Self-pay | Admitting: Gastroenterology

## 2014-02-21 ENCOUNTER — Other Ambulatory Visit: Payer: Self-pay | Admitting: Internal Medicine

## 2014-02-21 ENCOUNTER — Ambulatory Visit (INDEPENDENT_AMBULATORY_CARE_PROVIDER_SITE_OTHER): Payer: Medicare HMO | Admitting: Gastroenterology

## 2014-02-21 VITALS — BP 113/67 | HR 83 | Temp 97.7°F | Ht 72.0 in | Wt >= 6400 oz

## 2014-02-21 DIAGNOSIS — R1013 Epigastric pain: Secondary | ICD-10-CM

## 2014-02-21 DIAGNOSIS — Z860101 Personal history of adenomatous and serrated colon polyps: Secondary | ICD-10-CM | POA: Insufficient documentation

## 2014-02-21 DIAGNOSIS — Z86010 Personal history of colonic polyps: Secondary | ICD-10-CM

## 2014-02-21 MED ORDER — PEG 3350-KCL-NA BICARB-NACL 420 G PO SOLR: 4000.0000 mL | ORAL | Status: DC

## 2014-02-21 NOTE — Patient Instructions (Signed)
1. Colonoscopy and upper endoscopy with Dr. Rourk. See separate instructions. 

## 2014-02-21 NOTE — Progress Notes (Signed)
Primary Care Physician:  Tula Nakayama, MD  Primary Gastroenterologist:  Garfield Cornea, MD   Chief Complaint  Patient presents with  . Abdominal Pain    x 3 months    HPI:  Nicholas [name] is a 58 y.o. male here at the request of Dr. Moshe Cipro for further evaluation of  mid-abdominal pain for past three months. associated with nausea but no vomiting. Not necessarily related to food but sometimes better with soft foods.  Had PNA last year for 7 months, lots of antibiotics. Never hospitalized. No heartburn, no dysphagia. Regular BMs. Occasional constipation if has to take a pain pill. No melena, brbpr. 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    Current Outpatient Prescriptions  Medication Sig Dispense Refill  . aspirin (ASPIRIN LOW DOSE) 81 MG EC tablet Take 81 mg by mouth daily. Take one tablet by mouth once a day       . brimonidine (ALPHAGAN P) 0.1 % SOLN Place 1 drop into both eyes 2 (two) times daily. One drop in each eye two times a day       . brimonidine-timolol (COMBIGAN) 0.2-0.5 % ophthalmic solution Place 1 drop into both eyes every 12 (twelve) hours. One drop in each eye two times a day        . buPROPion (WELLBUTRIN XL) 150 MG 24 hr tablet TAKE ONE TABLET BY MOUTH DAILY.  30 tablet  2  . Canagliflozin (INVOKANA) 100 MG TABS Take 1 tablet by mouth daily.      . citalopram (CELEXA) 40 MG tablet TAKE 1 TABLET BY MOUTH ONCE A DAY.  30 tablet  3  . clotrimazole-betamethasone (LOTRISONE) cream APPLY TO AFFECTED AREAS TWICE DAILY.  45 g  0  . cyclobenzaprine (FLEXERIL) 10 MG tablet TAKE (1) TABLET BY MOUTH EVERY EIGHT HOURS AS NEEDED FOR MUSCLE SPASMS.  30 tablet  3  . exenatide (BYETTA 10 MCG PEN) 10 MCG/0.04ML SOLN Inject 10 mcg into the skin 2 (two) times daily with a meal.       . gabapentin (NEURONTIN) 300 MG capsule Take 1 capsule (300 mg total) by mouth daily.  30 capsule  1  . insulin aspart (NOVOLOG) 100 UNIT/ML injection Inject 10 Units into the  skin 3 (three) times daily before meals.      . insulin detemir (LEVEMIR) 100 UNIT/ML injection Inject 80 Units into the skin at bedtime.      Marland Kitchen ketoconazole (NIZORAL) 2 % shampoo APPLY TOPICALLY TWICE WEEKLY AS DIRECTED.  120 mL  2  . lisinopril (PRINIVIL,ZESTRIL) 5 MG tablet TAKE 1 TABLET BY MOUTH ONCE A DAY.  30 tablet  2  . nystatin (MYCOSTATIN/NYSTOP) 100000 UNIT/GM POWD Apply topically twice daily to affected area x 10 days  60 g  0  . nystatin-triamcinolone ointment (MYCOLOG) APPLY TO AFFECTED AREA TWICE DAILY.  60 g  1  . simvastatin (ZOCOR) 40 MG tablet TAKE 1 TABLET BY MOUTH ONCE A DAY.  30 tablet  3  . ACCU-CHEK AVIVA PLUS test strip TEST FOUR TIMES A DAY AS DIRECTED.  100 each  5  . B-D INS SYR ULTRAFINE 1CC/31G 31G X 5/16" 1 ML MISC USE AS DIRECTED WITH INSULIN VIALS TWICE DAILY.  100 each  2  . B-D INS SYRINGE 0.5CC/31GX5/16 31G X 5/16" 0.5 ML MISC USE TO INJECT THREE TIMES DAILY.  100 each  PRN  . Insulin Pen Needle 31G X 8 MM MISC by Does not apply route. Use  two times a day as needed       . traMADol (ULTRAM) 50 MG tablet as needed.       No current facility-administered medications for this visit.    Allergies as of 02/21/2014  . (No Known Allergies)    Past Medical History  Diagnosis Date  . Diabetes   . Hyperlipidemia   . Testosterone deficiency   . Hypertension   . Poor circulation     leg   . MRSA (methicillin resistant Staphylococcus aureus)   . Chronic knee pain   . Depression   . Glaucoma   . Suicide attempt     Past Surgical History  Procedure Laterality Date  . Kidney stones    . Colonoscopy  03/2008    BZJ:IRCVEL external hemorrhoidal tag, otherwise normal rectum/Two diminutive rectosigmoid polyps s/p bx/ Polyp in the opposite the ileocecal valve s/p bx. tubular adenoma.    Family History  Problem Relation Age of Onset  . Diabetes Mother   . Cancer Mother   . Leukemia Mother   . Cancer Father     colon   . Heart disease Father   . Glaucoma  Father   . Parkinsonism Father     altzheimers , brain hemorrhage    History   Social History  . Marital Status: Legally Separated    Spouse Name: N/A    Number of Children: 0  . Years of Education: 12   Occupational History  . DISABLED   .     Social History Main Topics  . Smoking status: Never Smoker   . Smokeless tobacco: Not on file     Comment: Quit x 13 years  . Alcohol Use: No  . Drug Use: No  . Sexual Activity: Not on file   Other Topics Concern  . Not on file   Social History Narrative  . No narrative on file      ROS:  General: Negative for anorexia, weight loss, fever, chills, fatigue, weakness. Eyes: Negative for vision changes.  ENT: Negative for hoarseness, difficulty swallowing , nasal congestion. CV: Negative for chest pain, angina, palpitations, dyspnea on exertion, peripheral edema.  Respiratory: Negative for dyspnea at rest, dyspnea on exertion, cough, sputum, wheezing.  GI: See history of present illness. GU:  Negative for dysuria, hematuria, urinary incontinence, urinary frequency, nocturnal urination.  MS: Negative for joint pain, low back pain.  Derm: Negative for rash or itching.  Neuro: Negative for weakness, abnormal sensation, seizure, frequent headaches, memory loss, confusion.  Psych: Negative for anxiety, depression, suicidal ideation, hallucinations.  Endo: Negative for unusual weight change.  Heme: Negative for bruising or bleeding. Allergy: Negative for rash or hives.    Physical Examination:  BP 113/67  Pulse 83  Temp(Src) 97.7 F (36.5 C) (Oral)  Ht 6' (1.829 m)  Wt 446 lb 12.8 oz (202.667 kg)  BMI 60.58 kg/m2   General: Well-nourished, well-developed in no acute distress.  Head: Normocephalic, atraumatic.   Eyes: Conjunctiva pink, no icterus. Mouth: Oropharyngeal mucosa moist and pink , no lesions erythema or exudate. Neck: Supple without thyromegaly, masses, or lymphadenopathy.  Lungs: Clear to auscultation  bilaterally.  Heart: Regular rate and rhythm, no murmurs rubs or gallops.  Abdomen: Bowel sounds are normal, obese, exam limited due to body habitus, diffuse epigastric tenderness, nondistended, no hepatosplenomegaly or masses, no abdominal bruits or    hernia , no rebound or guarding.   Rectal: not performed Extremities: No lower extremity edema. No clubbing or  deformities.  Neuro: Alert and oriented x 4 , grossly normal neurologically.  Skin: Warm and dry, no rash or jaundice.   Psych: Alert and cooperative, normal mood and affect.  Labs: Lab Results  Component Value Date   WBC 8.8 11/28/2013   HGB 14.0 11/28/2013   HCT 42.5 11/28/2013   MCV 89.1 11/28/2013   PLT 211 11/28/2013   Lab Results  Component Value Date   CREATININE 1.15 09/08/2013   BUN 25* 09/08/2013   NA 140 09/08/2013   K 5.3 09/08/2013   CL 98 09/08/2013   CO2 30 09/08/2013      Imaging Studies: Dg Chest 2 View  01/23/2014   CLINICAL DATA:  Followup pneumonia  EXAM: CHEST  2 VIEW  COMPARISON:  Chest radiograph 08/30/2013 and 04/17/2013  FINDINGS: Heart, mediastinal, and hilar contours are within normal limits stable. Pulmonary vascularity is normal. The lungs are normally expanded and clear. No focal airspace disease, pleural effusion, or interstitial abnormality is identified. Negative for pneumothorax. No acute osseous abnormality.  IMPRESSION: No active cardiopulmonary disease.  Stable exam.   Electronically Signed   By: Curlene Dolphin M.D.   On: 01/23/2014 13:37

## 2014-02-25 NOTE — Assessment & Plan Note (Signed)
Due for surveillance colonoscopy.  I have discussed the risks, alternatives, benefits with regards to but not limited to the risk of reaction to medication, bleeding, infection, perforation and the patient is agreeable to proceed. Written consent to be obtained.  Phenergan 25mg  IV 30 minutes before procedure to augment conscious sedation given polypharmacy.

## 2014-02-25 NOTE — Assessment & Plan Note (Addendum)
58 y/o male with 3 month history of epigastric tenderness. Ddx includes gastritis, PUD, malignacy, GERD, biliary. Recommend EGD in near future.  I have discussed the risks, alternatives, benefits with regards to but not limited to the risk of reaction to medication, bleeding, infection, perforation and the patient is agreeable to proceed. Written consent to be obtained.

## 2014-02-28 NOTE — Progress Notes (Signed)
Cc to pcp °

## 2014-03-01 ENCOUNTER — Other Ambulatory Visit: Payer: Self-pay | Admitting: Family Medicine

## 2014-03-01 ENCOUNTER — Telehealth: Payer: Self-pay | Admitting: Internal Medicine

## 2014-03-01 NOTE — Telephone Encounter (Signed)
PATIENT WANTING TO CANCEL COLONOSCOPY DIE TO COPAY

## 2014-03-01 NOTE — Telephone Encounter (Signed)
I explained to the patient that he could wait until the final bill an then make payment arragements

## 2014-03-09 ENCOUNTER — Ambulatory Visit (INDEPENDENT_AMBULATORY_CARE_PROVIDER_SITE_OTHER): Payer: Commercial Managed Care - HMO | Admitting: Urology

## 2014-03-09 DIAGNOSIS — E291 Testicular hypofunction: Secondary | ICD-10-CM

## 2014-03-09 DIAGNOSIS — R6882 Decreased libido: Secondary | ICD-10-CM

## 2014-03-19 NOTE — Telephone Encounter (Signed)
Patient would like to cancel his procedure for this Friday, because he can't afford to have it done right now.  I tried to talk with him about upon completion of his procedure we could have several options to make payment arrangements if a bill is due at that time.  However, the patient was adamant about cancelling his procedure.  I spoke with Hoyle Sauer in Endo to have procedure cancelled.

## 2014-03-20 ENCOUNTER — Telehealth: Payer: Self-pay | Admitting: Internal Medicine

## 2014-03-20 NOTE — Telephone Encounter (Signed)
Pt has been placed back on the schedule for 9/25 and he is aware

## 2014-03-20 NOTE — Telephone Encounter (Signed)
PATIENT CALLED AND LM THAT HE FOUND OUT HIS CO-PAY WAS NOT WHAT HE THOUGHT IT WAS GOING TO BE AND WANTS TO BE PUT BACK ON THE SCHEDULE.  PLEASE CALL HIM AT [phone]

## 2014-03-20 NOTE — Addendum Note (Signed)
Addended by: Idamae Schuller on: 03/20/2014 08:28 AM   Modules accepted: Orders

## 2014-03-23 ENCOUNTER — Encounter (HOSPITAL_COMMUNITY): Admission: RE | Payer: Self-pay | Source: Ambulatory Visit

## 2014-03-23 ENCOUNTER — Encounter (HOSPITAL_COMMUNITY): Payer: Self-pay | Admitting: *Deleted

## 2014-03-23 ENCOUNTER — Ambulatory Visit (HOSPITAL_COMMUNITY)
Admission: RE | Admit: 2014-03-23 | Discharge: 2014-03-23 | Disposition: A | Discharge status: Home / Self Care | Payer: Medicare HMO | Source: Ambulatory Visit | Attending: Internal Medicine | Admitting: Internal Medicine

## 2014-03-23 ENCOUNTER — Encounter (HOSPITAL_COMMUNITY)
Admission: RE | Disposition: A | Discharge status: Home / Self Care | Payer: Self-pay | Source: Ambulatory Visit | Attending: Internal Medicine

## 2014-03-23 ENCOUNTER — Ambulatory Visit (HOSPITAL_COMMUNITY): Admission: RE | Admit: 2014-03-23 | Payer: Medicare HMO | Source: Ambulatory Visit | Admitting: Internal Medicine

## 2014-03-23 DIAGNOSIS — K621 Rectal polyp: Secondary | ICD-10-CM | POA: Diagnosis not present

## 2014-03-23 DIAGNOSIS — R1013 Epigastric pain: Secondary | ICD-10-CM

## 2014-03-23 DIAGNOSIS — K3189 Other diseases of stomach and duodenum: Secondary | ICD-10-CM | POA: Insufficient documentation

## 2014-03-23 DIAGNOSIS — Z794 Long term (current) use of insulin: Secondary | ICD-10-CM | POA: Diagnosis not present

## 2014-03-23 DIAGNOSIS — K259 Gastric ulcer, unspecified as acute or chronic, without hemorrhage or perforation: Secondary | ICD-10-CM | POA: Insufficient documentation

## 2014-03-23 DIAGNOSIS — D126 Benign neoplasm of colon, unspecified: Secondary | ICD-10-CM | POA: Diagnosis not present

## 2014-03-23 DIAGNOSIS — M25569 Pain in unspecified knee: Secondary | ICD-10-CM | POA: Diagnosis not present

## 2014-03-23 DIAGNOSIS — E785 Hyperlipidemia, unspecified: Secondary | ICD-10-CM | POA: Diagnosis not present

## 2014-03-23 DIAGNOSIS — F3289 Other specified depressive episodes: Secondary | ICD-10-CM | POA: Insufficient documentation

## 2014-03-23 DIAGNOSIS — R0602 Shortness of breath: Secondary | ICD-10-CM | POA: Insufficient documentation

## 2014-03-23 DIAGNOSIS — H409 Unspecified glaucoma: Secondary | ICD-10-CM | POA: Insufficient documentation

## 2014-03-23 DIAGNOSIS — F329 Major depressive disorder, single episode, unspecified: Secondary | ICD-10-CM | POA: Insufficient documentation

## 2014-03-23 DIAGNOSIS — Z79899 Other long term (current) drug therapy: Secondary | ICD-10-CM | POA: Insufficient documentation

## 2014-03-23 DIAGNOSIS — K620 Anal polyp: Secondary | ICD-10-CM | POA: Insufficient documentation

## 2014-03-23 DIAGNOSIS — Z7982 Long term (current) use of aspirin: Secondary | ICD-10-CM | POA: Insufficient documentation

## 2014-03-23 DIAGNOSIS — Z860100 Personal history of colon polyps, unspecified: Secondary | ICD-10-CM | POA: Insufficient documentation

## 2014-03-23 DIAGNOSIS — D129 Benign neoplasm of anus and anal canal: Secondary | ICD-10-CM

## 2014-03-23 DIAGNOSIS — E119 Type 2 diabetes mellitus without complications: Secondary | ICD-10-CM | POA: Insufficient documentation

## 2014-03-23 DIAGNOSIS — I10 Essential (primary) hypertension: Secondary | ICD-10-CM | POA: Insufficient documentation

## 2014-03-23 DIAGNOSIS — Z1211 Encounter for screening for malignant neoplasm of colon: Secondary | ICD-10-CM | POA: Insufficient documentation

## 2014-03-23 DIAGNOSIS — D128 Benign neoplasm of rectum: Secondary | ICD-10-CM

## 2014-03-23 DIAGNOSIS — G8929 Other chronic pain: Secondary | ICD-10-CM | POA: Insufficient documentation

## 2014-03-23 HISTORY — PX: ESOPHAGOGASTRODUODENOSCOPY: SHX5428

## 2014-03-23 HISTORY — PX: COLONOSCOPY: SHX5424

## 2014-03-23 LAB — GLUCOSE, CAPILLARY: GLUCOSE-CAPILLARY: 150 mg/dL — AB (ref 70–99)

## 2014-03-23 SURGERY — COLONOSCOPY
Anesthesia: Moderate Sedation

## 2014-03-23 MED ORDER — ONDANSETRON HCL 4 MG/2ML IJ SOLN
INTRAMUSCULAR | Status: AC
  Filled 2014-03-23: qty 2

## 2014-03-23 MED ORDER — STERILE WATER FOR IRRIGATION IR SOLN
Status: DC | PRN
  Administered 2014-03-23: 08:00:00

## 2014-03-23 MED ORDER — LIDOCAINE VISCOUS 2 % MT SOLN
OROMUCOSAL | Status: DC | PRN
  Administered 2014-03-23: 4 mL via OROMUCOSAL

## 2014-03-23 MED ORDER — MEPERIDINE HCL 100 MG/ML IJ SOLN
INTRAMUSCULAR | Status: AC
  Filled 2014-03-23: qty 2

## 2014-03-23 MED ORDER — ONDANSETRON HCL 4 MG/2ML IJ SOLN
INTRAMUSCULAR | Status: DC | PRN
  Administered 2014-03-23: 4 mg via INTRAVENOUS

## 2014-03-23 MED ORDER — MIDAZOLAM HCL 5 MG/5ML IJ SOLN
INTRAMUSCULAR | Status: DC | PRN
  Administered 2014-03-23: 1 mg via INTRAVENOUS
  Administered 2014-03-23: 2 mg via INTRAVENOUS
  Administered 2014-03-23 (×4): 1 mg via INTRAVENOUS

## 2014-03-23 MED ORDER — SODIUM CHLORIDE 0.9 % IV SOLN
INTRAVENOUS | Status: DC
  Administered 2014-03-23: 07:00:00 via INTRAVENOUS

## 2014-03-23 MED ORDER — SODIUM CHLORIDE 0.9 % IJ SOLN
INTRAMUSCULAR | Status: AC
  Filled 2014-03-23: qty 10

## 2014-03-23 MED ORDER — LIDOCAINE VISCOUS 2 % MT SOLN
OROMUCOSAL | Status: AC
  Filled 2014-03-23: qty 15

## 2014-03-23 MED ORDER — MIDAZOLAM HCL 5 MG/5ML IJ SOLN
INTRAMUSCULAR | Status: AC
  Filled 2014-03-23: qty 10

## 2014-03-23 MED ORDER — PROMETHAZINE HCL 25 MG/ML IJ SOLN
INTRAMUSCULAR | Status: AC
  Filled 2014-03-23: qty 1

## 2014-03-23 MED ORDER — MEPERIDINE HCL 100 MG/ML IJ SOLN
INTRAMUSCULAR | Status: DC | PRN
  Administered 2014-03-23 (×2): 25 mg via INTRAVENOUS

## 2014-03-23 MED ORDER — PROMETHAZINE HCL 25 MG/ML IJ SOLN
25.0000 mg | Freq: Once | INTRAMUSCULAR | Status: AC
  Administered 2014-03-23: 25 mg via INTRAVENOUS

## 2014-03-23 NOTE — Op Note (Signed)
[facility] [address] [place], [address]   COLONOSCOPY PROCEDURE REPORT  PATIENT: Nicholas [name], Nicholas [name]  MR#: [id number] BIRTHDATE: Nov 27, 1955 , 77  yrs. old GENDER: male ENDOSCOPIST: R.  Garfield Cornea, MD FACP [facility] REFERRED NU:[id number] Moshe Cipro, M.D. PROCEDURE DATE:  03/23/2014 PROCEDURE:   Colonoscopy with snare polypectomy and Colonoscopy with biopsy INDICATIONS:surveillance colonoscopy based on a history of adenomatous colonic polyp(s). MEDICATIONS: Versed 7 mg IV and Demerol 50 mg IV in divided doses. Phenergan 25 mg IV and Zofran 4 mg any ASA CLASS:       Class III  CONSENT: The risks, benefits, alternatives and imponderables including but not limited to bleeding, perforation as well as the possibility of a missed lesion have been reviewed.  The potential for biopsy, lesion removal, etc. have also been discussed. Questions have been answered.  All parties agreeable.  Please see the history and physical in the medical record for more information.  DESCRIPTION OF PROCEDURE:   After the risks benefits and alternatives of the procedure were thoroughly explained, informed consent was obtained.  The digital rectal exam revealed no abnormalities of the rectum.   The EC-[id number] ([id number])  endoscope was introduced through the anus and advanced to the cecum, which was identified by the appendix. No adverse events experienced. The quality of the prep was adequate.  The instrument was then slowly withdrawn as the colon was fully examined.  Multiple cold biopsy and cold snare polypectomies performed. The larger rectal polyp was hot snare removed. All polyps removed cleanly and completely.      COLON FINDINGS: Multiple colonic polyps throughout his rectum and colon.  Sessile and pedunculated.  The largest polyp was proximally 6 mm in dimensions in the rectum at 3 cm with adjacent diminutive polyp.  There was also a polyps on the ileocecal valve, ascending segmen  descending and sigmoid segment. as the colon was tortuous and elongated requiring external abdominal pressure and changing of the patient's position to reach the cecum. Retroflexed views revealed no abnormalities. .  Withdrawal time=14 minutes 30 seconds.  The scope was withdrawn and the procedure completed. COMPLICATIONS: There were no complications.  ENDOSCOPIC IMPRESSION: Multiple colonic polyps throughout his rectum and colon.  Sessile and pedunculated.  The largest polyp was proximally 6 mm in dimensions in the rectum at 3 cm with adjacent diminutive polyp. There was also a polyps on the ileocecal valve, ascending segmen descending and sigmoid segment. as the colon was tortuous and elongated requiring external abdominal pressure and changing of the patient's position to reach the cecum  RECOMMENDATIONS: Await biopsy results.  See EGD report.  eSigned:  R. Garfield Cornea, MD FACP [facility] 03/23/2014 8:59 AM   cc:   PATIENT NAME:  Nicholas [name], Nicholas [name] MR#: [id number]

## 2014-03-23 NOTE — Interval H&P Note (Signed)
History and Physical Interval Note:  03/23/2014 7:38 AM  Nicholas [name]  has presented today for surgery, with the diagnosis of H/O POLYPS, ABD PAIN  The various methods of treatment have been discussed with the patient and family. After consideration of risks, benefits and other options for treatment, the patient has consented to  Procedure(s): COLONOSCOPY (N/A) ESOPHAGOGASTRODUODENOSCOPY (EGD) (N/A) as a surgical intervention .  The patient's history has been reviewed, patient examined, no change in status, stable for surgery.  I have reviewed the patient's chart and labs.  Questions were answered to the patient's satisfaction.     Nicholas [name]  No change. EGD and colonoscopy per plan.The risks, benefits, limitations, imponderables and alternatives regarding both EGD and colonoscopy have been reviewed with the patient. Questions have been answered. All parties agreeable.

## 2014-03-23 NOTE — Op Note (Signed)
[facility] [address] [place], [address]   ENDOSCOPY PROCEDURE REPORT  PATIENT: [name], Nicholas  MR#: [id number] BIRTHDATE: 11/13/1955 , 59  yrs. old GENDER: male ENDOSCOPIST: R.  Garfield Cornea, MD FACP FACG REFERRED BY:  Tula Nakayama, M.D. PROCEDURE DATE:  03/23/2014 PROCEDURE:  EGD w/ biopsy ASA CLASS:     Class III INDICATIONS:  dyspepsia; epigastric pain. MEDICATIONS: Versed 3 mg IV and Demerol 50 mg IV in divided doses. Phenergan 25 mg IV, Zofran 4 mg IV, Xylocaine gel orally   DESCRIPTION OF PROCEDURE: After the risks benefits and alternatives of the procedure were thoroughly explained, informed consent was obtained.  The EG-[id number] ([id number]) endoscope was introduced through the mouth and advanced to the second portion of the duodenum , Without limitations.  The instrument was slowly withdrawn as the mucosa was fully examined.    Diffuse gastric erosions.  (1) 5 mm area of healing ulceration in the antrum.  No ulcer or infiltrating process observed.  Patent pylorus.  Normal-appearing first and second portion of the duodenum.   Patient had circumferential distal esophageal erosions within 5 mm of the GE junction.  No Barrett's esophagus.  Tubular esophagus patent throughout its course.  Retroflexed views revealed no abnormalities.     The scope was then withdrawn from the patient and the procedure completed.  COMPLICATIONS: There were no complications.  ENDOSCOPIC IMPRESSION: 1.   Patient had circumferential distal esophageal erosions within 5 mm of the GE junction.  No Barrett's esophagus.  Tubular esophagus patent throughout its course 2.   Diffuse gastric erosions.  (1) 5 mm area of healing ulceration in the antrum.  No ulcer or infiltrating process observed.  Patent pylorus.  Normal-appearing first and second portion of the duodenum   RECOMMENDATIONS: Await pathology results  REPEAT EXAM:  eSigned:  R. Garfield Cornea, MD FACP [facility]  03/23/2014 8:53 AM    CC:

## 2014-03-23 NOTE — H&P (View-Only) (Signed)
Primary Care Physician:  Tula Nakayama, MD  Primary Gastroenterologist:  Garfield Cornea, MD   Chief Complaint  Patient presents with  . Abdominal Pain    x 3 months    HPI:  Nicholas [name] is a 58 y.o. male here at the request of Dr. Moshe Cipro for further evaluation of  mid-abdominal pain for past three months. associated with nausea but no vomiting. Not necessarily related to food but sometimes better with soft foods.  Had PNA last year for 7 months, lots of antibiotics. Never hospitalized. No heartburn, no dysphagia. Regular BMs. Occasional constipation if has to take a pain pill. No melena, brbpr. 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    Current Outpatient Prescriptions  Medication Sig Dispense Refill  . aspirin (ASPIRIN LOW DOSE) 81 MG EC tablet Take 81 mg by mouth daily. Take one tablet by mouth once a day       . brimonidine (ALPHAGAN P) 0.1 % SOLN Place 1 drop into both eyes 2 (two) times daily. One drop in each eye two times a day       . brimonidine-timolol (COMBIGAN) 0.2-0.5 % ophthalmic solution Place 1 drop into both eyes every 12 (twelve) hours. One drop in each eye two times a day        . buPROPion (WELLBUTRIN XL) 150 MG 24 hr tablet TAKE ONE TABLET BY MOUTH DAILY.  30 tablet  2  . Canagliflozin (INVOKANA) 100 MG TABS Take 1 tablet by mouth daily.      . citalopram (CELEXA) 40 MG tablet TAKE 1 TABLET BY MOUTH ONCE A DAY.  30 tablet  3  . clotrimazole-betamethasone (LOTRISONE) cream APPLY TO AFFECTED AREAS TWICE DAILY.  45 g  0  . cyclobenzaprine (FLEXERIL) 10 MG tablet TAKE (1) TABLET BY MOUTH EVERY EIGHT HOURS AS NEEDED FOR MUSCLE SPASMS.  30 tablet  3  . exenatide (BYETTA 10 MCG PEN) 10 MCG/0.04ML SOLN Inject 10 mcg into the skin 2 (two) times daily with a meal.       . gabapentin (NEURONTIN) 300 MG capsule Take 1 capsule (300 mg total) by mouth daily.  30 capsule  1  . insulin aspart (NOVOLOG) 100 UNIT/ML injection Inject 10 Units into the  skin 3 (three) times daily before meals.      . insulin detemir (LEVEMIR) 100 UNIT/ML injection Inject 80 Units into the skin at bedtime.      Marland Kitchen ketoconazole (NIZORAL) 2 % shampoo APPLY TOPICALLY TWICE WEEKLY AS DIRECTED.  120 mL  2  . lisinopril (PRINIVIL,ZESTRIL) 5 MG tablet TAKE 1 TABLET BY MOUTH ONCE A DAY.  30 tablet  2  . nystatin (MYCOSTATIN/NYSTOP) 100000 UNIT/GM POWD Apply topically twice daily to affected area x 10 days  60 g  0  . nystatin-triamcinolone ointment (MYCOLOG) APPLY TO AFFECTED AREA TWICE DAILY.  60 g  1  . simvastatin (ZOCOR) 40 MG tablet TAKE 1 TABLET BY MOUTH ONCE A DAY.  30 tablet  3  . ACCU-CHEK AVIVA PLUS test strip TEST FOUR TIMES A DAY AS DIRECTED.  100 each  5  . B-D INS SYR ULTRAFINE 1CC/31G 31G X 5/16" 1 ML MISC USE AS DIRECTED WITH INSULIN VIALS TWICE DAILY.  100 each  2  . B-D INS SYRINGE 0.5CC/31GX5/16 31G X 5/16" 0.5 ML MISC USE TO INJECT THREE TIMES DAILY.  100 each  PRN  . Insulin Pen Needle 31G X 8 MM MISC by Does not apply route. Use  two times a day as needed       . traMADol (ULTRAM) 50 MG tablet as needed.       No current facility-administered medications for this visit.    Allergies as of 02/21/2014  . (No Known Allergies)    Past Medical History  Diagnosis Date  . Diabetes   . Hyperlipidemia   . Testosterone deficiency   . Hypertension   . Poor circulation     leg   . MRSA (methicillin resistant Staphylococcus aureus)   . Chronic knee pain   . Depression   . Glaucoma   . Suicide attempt     Past Surgical History  Procedure Laterality Date  . Kidney stones    . Colonoscopy  03/2008    XVQ:MGQQPY external hemorrhoidal tag, otherwise normal rectum/Two diminutive rectosigmoid polyps s/p bx/ Polyp in the opposite the ileocecal valve s/p bx. tubular adenoma.    Family History  Problem Relation Age of Onset  . Diabetes Mother   . Cancer Mother   . Leukemia Mother   . Cancer Father     colon   . Heart disease Father   . Glaucoma  Father   . Parkinsonism Father     altzheimers , brain hemorrhage    History   Social History  . Marital Status: Legally Separated    Spouse Name: N/A    Number of Children: 0  . Years of Education: 12   Occupational History  . DISABLED   .     Social History Main Topics  . Smoking status: Never Smoker   . Smokeless tobacco: Not on file     Comment: Quit x 13 years  . Alcohol Use: No  . Drug Use: No  . Sexual Activity: Not on file   Other Topics Concern  . Not on file   Social History Narrative  . No narrative on file      ROS:  General: Negative for anorexia, weight loss, fever, chills, fatigue, weakness. Eyes: Negative for vision changes.  ENT: Negative for hoarseness, difficulty swallowing , nasal congestion. CV: Negative for chest pain, angina, palpitations, dyspnea on exertion, peripheral edema.  Respiratory: Negative for dyspnea at rest, dyspnea on exertion, cough, sputum, wheezing.  GI: See history of present illness. GU:  Negative for dysuria, hematuria, urinary incontinence, urinary frequency, nocturnal urination.  MS: Negative for joint pain, low back pain.  Derm: Negative for rash or itching.  Neuro: Negative for weakness, abnormal sensation, seizure, frequent headaches, memory loss, confusion.  Psych: Negative for anxiety, depression, suicidal ideation, hallucinations.  Endo: Negative for unusual weight change.  Heme: Negative for bruising or bleeding. Allergy: Negative for rash or hives.    Physical Examination:  BP 113/67  Pulse 83  Temp(Src) 97.7 F (36.5 C) (Oral)  Ht 6' (1.829 m)  Wt 446 lb 12.8 oz (202.667 kg)  BMI 60.58 kg/m2   General: Well-nourished, well-developed in no acute distress.  Head: Normocephalic, atraumatic.   Eyes: Conjunctiva pink, no icterus. Mouth: Oropharyngeal mucosa moist and pink , no lesions erythema or exudate. Neck: Supple without thyromegaly, masses, or lymphadenopathy.  Lungs: Clear to auscultation  bilaterally.  Heart: Regular rate and rhythm, no murmurs rubs or gallops.  Abdomen: Bowel sounds are normal, obese, exam limited due to body habitus, diffuse epigastric tenderness, nondistended, no hepatosplenomegaly or masses, no abdominal bruits or    hernia , no rebound or guarding.   Rectal: not performed Extremities: No lower extremity edema. No clubbing or  deformities.  Neuro: Alert and oriented x 4 , grossly normal neurologically.  Skin: Warm and dry, no rash or jaundice.   Psych: Alert and cooperative, normal mood and affect.  Labs: Lab Results  Component Value Date   WBC 8.8 11/28/2013   HGB 14.0 11/28/2013   HCT 42.5 11/28/2013   MCV 89.1 11/28/2013   PLT 211 11/28/2013   Lab Results  Component Value Date   CREATININE 1.15 09/08/2013   BUN 25* 09/08/2013   NA 140 09/08/2013   K 5.3 09/08/2013   CL 98 09/08/2013   CO2 30 09/08/2013      Imaging Studies: Dg Chest 2 View  01/23/2014   CLINICAL DATA:  Followup pneumonia  EXAM: CHEST  2 VIEW  COMPARISON:  Chest radiograph 08/30/2013 and 04/17/2013  FINDINGS: Heart, mediastinal, and hilar contours are within normal limits stable. Pulmonary vascularity is normal. The lungs are normally expanded and clear. No focal airspace disease, pleural effusion, or interstitial abnormality is identified. Negative for pneumothorax. No acute osseous abnormality.  IMPRESSION: No active cardiopulmonary disease.  Stable exam.   Electronically Signed   By: Curlene Dolphin M.D.   On: 01/23/2014 13:37

## 2014-03-23 NOTE — Discharge Instructions (Signed)
Colonoscopy Discharge Instructions  Read the instructions outlined below and refer to this sheet in the next few weeks. These discharge instructions provide you with general information on caring for yourself after you leave the hospital. Your doctor may also give you specific instructions. While your treatment has been planned according to the most current medical practices available, unavoidable complications occasionally occur. If you have any problems or questions after discharge, call Dr. Gala Romney at [phone]. ACTIVITY  You may resume your regular activity, but move at a slower pace for the next 24 hours.   Take frequent rest periods for the next 24 hours.   Walking will help get rid of the air and reduce the bloated feeling in your belly (abdomen).   No driving for 24 hours (because of the medicine (anesthesia) used during the test).    Do not sign any important legal documents or operate any machinery for 24 hours (because of the anesthesia used during the test).  NUTRITION  Drink plenty of fluids.   You may resume your normal diet as instructed by your doctor.   Begin with a light meal and progress to your normal diet. Heavy or fried foods are harder to digest and may make you feel sick to your stomach (nauseated).   Avoid alcoholic beverages for 24 hours or as instructed.  MEDICATIONS  You may resume your normal medications unless your doctor tells you otherwise.  WHAT YOU CAN EXPECT TODAY  Some feelings of bloating in the abdomen.   Passage of more gas than usual.   Spotting of blood in your stool or on the toilet paper.  IF YOU HAD POLYPS REMOVED DURING THE COLONOSCOPY:  No aspirin products for 7 days or as instructed.   No alcohol for 7 days or as instructed.   Eat a soft diet for the next 24 hours.  FINDING OUT THE RESULTS OF YOUR TEST Not all test results are available during your visit. If your test results are not back during the visit, make an appointment  with your caregiver to find out the results. Do not assume everything is normal if you have not heard from your caregiver or the medical facility. It is important for you to follow up on all of your test results.  SEEK IMMEDIATE MEDICAL ATTENTION IF:  You have more than a spotting of blood in your stool.   Your belly is swollen (abdominal distention).   You are nauseated or vomiting.   You have a temperature over 101.   You have abdominal pain or discomfort that is severe or gets worse throughout the day.   EGD Discharge instructions Please read the instructions outlined below and refer to this sheet in the next few weeks. These discharge instructions provide you with general information on caring for yourself after you leave the hospital. Your doctor may also give you specific instructions. While your treatment has been planned according to the most current medical practices available, unavoidable complications occasionally occur. If you have any problems or questions after discharge, please call your doctor. ACTIVITY  You may resume your regular activity but move at a slower pace for the next 24 hours.   Take frequent rest periods for the next 24 hours.   Walking will help expel (get rid of) the air and reduce the bloated feeling in your abdomen.   No driving for 24 hours (because of the anesthesia (medicine) used during the test).   You may shower.   Do not sign any  important legal documents or operate any machinery for 24 hours (because of the anesthesia used during the test).  NUTRITION  Drink plenty of fluids.   You may resume your normal diet.   Begin with a light meal and progress to your normal diet.   Avoid alcoholic beverages for 24 hours or as instructed by your caregiver.  MEDICATIONS  You may resume your normal medications unless your caregiver tells you otherwise.  WHAT YOU CAN EXPECT TODAY  You may experience abdominal discomfort such as a feeling of  fullness or gas pains.  FOLLOW-UP  Your doctor will discuss the results of your test with you.  SEEK IMMEDIATE MEDICAL ATTENTION IF ANY OF THE FOLLOWING OCCUR:  Excessive nausea (feeling sick to your stomach) and/or vomiting.   Severe abdominal pain and distention (swelling).   Trouble swallowing.   Temperature over 101 F (37.8 C).   Rectal bleeding or vomiting of blood.     GERD and polyp information provided  Begin Protonix 40 mg twice daily.  Avoid all nonsteroidal agents.  Further recommendations to follow pending review of pathology report  Office visit with [place] in 3 months   Colon Polyps Polyps are lumps of extra tissue growing inside the body. Polyps can grow in the large intestine (colon). Most colon polyps are noncancerous (benign). However, some colon polyps can become cancerous over time. Polyps that are larger than a pea may be harmful. To be safe, caregivers remove and test all polyps. CAUSES  Polyps form when mutations in the genes cause your cells to grow and divide even though no more tissue is needed. RISK FACTORS There are a number of risk factors that can increase your chances of getting colon polyps. They include: Being older than 50 years. Family history of colon polyps or colon cancer. Long-term colon diseases, such as colitis or Crohn disease. Being overweight. Smoking. Being inactive. Drinking too much alcohol. SYMPTOMS  Most small polyps do not cause symptoms. If symptoms are present, they may include: Blood in the stool. The stool may look dark red or black. Constipation or diarrhea that lasts longer than 1 week. DIAGNOSIS People often do not know they have polyps until their caregiver finds them during a regular checkup. Your caregiver can use 4 tests to check for polyps: Digital rectal exam. The caregiver wears gloves and feels inside the rectum. This test would find polyps only in the rectum. Barium enema. The caregiver puts a liquid  called barium into your rectum before taking X-rays of your colon. Barium makes your colon look white. Polyps are dark, so they are easy to see in the X-ray pictures. Sigmoidoscopy. A thin, flexible tube (sigmoidoscope) is placed into your rectum. The sigmoidoscope has a light and tiny camera in it. The caregiver uses the sigmoidoscope to look at the last third of your colon. Colonoscopy. This test is like sigmoidoscopy, but the caregiver looks at the entire colon. This is the most common method for finding and removing polyps. TREATMENT  Any polyps will be removed during a sigmoidoscopy or colonoscopy. The polyps are then tested for cancer. PREVENTION  To help lower your risk of getting more colon polyps: Eat plenty of fruits and vegetables. Avoid eating fatty foods. Do not smoke. Avoid drinking alcohol. Exercise every day. Lose weight if recommended by your caregiver. Eat plenty of calcium and folate. Foods that are rich in calcium include milk, cheese, and broccoli. Foods that are rich in folate include chickpeas, kidney beans, and spinach. HOME  CARE INSTRUCTIONS Keep all follow-up appointments as directed by your caregiver. You may need periodic exams to check for polyps. SEEK MEDICAL CARE IF: You notice bleeding during a bowel movement. Document Released: 03/11/2004 Document Revised: 09/07/2011 Document Reviewed: 08/25/2011 [facility] Patient Information 2015 [place], [place]. This information is not intended to replace advice given to you by your health care provider. Make sure you discuss any questions you have with your health care provider. Gastroesophageal Reflux Disease, Adult Gastroesophageal reflux disease (GERD) happens when acid from your stomach flows up into the esophagus. When acid comes in contact with the esophagus, the acid causes soreness (inflammation) in the esophagus. Over time, GERD may create small holes (ulcers) in the lining of the esophagus. CAUSES  Increased body  weight. This puts pressure on the stomach, making acid rise from the stomach into the esophagus. Smoking. This increases acid production in the stomach. Drinking alcohol. This causes decreased pressure in the lower esophageal sphincter (valve or ring of muscle between the esophagus and stomach), allowing acid from the stomach into the esophagus. Late evening meals and a full stomach. This increases pressure and acid production in the stomach. A malformed lower esophageal sphincter. Sometimes, no cause is found. SYMPTOMS  Burning pain in the lower part of the mid-chest behind the breastbone and in the mid-stomach area. This may occur twice a week or more often. Trouble swallowing. Sore throat. Dry cough. Asthma-like symptoms including chest tightness, shortness of breath, or wheezing. DIAGNOSIS  Your caregiver may be able to diagnose GERD based on your symptoms. In some cases, X-rays and other tests may be done to check for complications or to check the condition of your stomach and esophagus. TREATMENT  Your caregiver may recommend over-the-counter or prescription medicines to help decrease acid production. Ask your caregiver before starting or adding any new medicines.  HOME CARE INSTRUCTIONS  Change the factors that you can control. Ask your caregiver for guidance concerning weight loss, quitting smoking, and alcohol consumption. Avoid foods and drinks that make your symptoms worse, such as: Caffeine or alcoholic drinks. Chocolate. Peppermint or mint flavorings. Garlic and onions. Spicy foods. Citrus fruits, such as oranges, lemons, or limes. Tomato-based foods such as sauce, chili, salsa, and pizza. Fried and fatty foods. Avoid lying down for the 3 hours prior to your bedtime or prior to taking a nap. Eat small, frequent meals instead of large meals. Wear loose-fitting clothing. Do not wear anything tight around your waist that causes pressure on your stomach. Raise the head of your  bed 6 to 8 inches with wood blocks to help you sleep. Extra pillows will not help. Only take over-the-counter or prescription medicines for pain, discomfort, or fever as directed by your caregiver. Do not take aspirin, ibuprofen, or other nonsteroidal anti-inflammatory drugs (NSAIDs). SEEK IMMEDIATE MEDICAL CARE IF:  You have pain in your arms, neck, jaw, teeth, or back. Your pain increases or changes in intensity or duration. You develop nausea, vomiting, or sweating (diaphoresis). You develop shortness of breath, or you faint. Your vomit is green, yellow, black, or looks like coffee grounds or blood. Your stool is red, bloody, or black. These symptoms could be signs of other problems, such as heart disease, gastric bleeding, or esophageal bleeding. MAKE SURE YOU:  Understand these instructions. Will watch your condition. Will get help right away if you are not doing well or get worse. Document Released: 03/25/2005 Document Revised: 09/07/2011 Document Reviewed: 01/02/2011 [facility] Patient Information 2015 [place], [place]. This information is not  intended to replace advice given to you by your health care provider. Make sure you discuss any questions you have with your health care provider. ° °

## 2014-03-26 ENCOUNTER — Encounter (HOSPITAL_COMMUNITY): Payer: Self-pay | Admitting: Internal Medicine

## 2014-03-27 ENCOUNTER — Other Ambulatory Visit: Payer: Self-pay | Admitting: Family Medicine

## 2014-03-31 ENCOUNTER — Encounter: Payer: Self-pay | Admitting: Internal Medicine

## 2014-04-02 ENCOUNTER — Ambulatory Visit: Payer: Medicare HMO | Admitting: Family Medicine

## 2014-04-12 ENCOUNTER — Other Ambulatory Visit: Payer: Self-pay | Admitting: Family Medicine

## 2014-04-25 ENCOUNTER — Other Ambulatory Visit: Payer: Self-pay | Admitting: Family Medicine

## 2014-04-26 ENCOUNTER — Encounter: Payer: Self-pay | Admitting: Family Medicine

## 2014-04-26 NOTE — Assessment & Plan Note (Signed)
Antibiotic prescribed for 10 days, needs CXR and sputum c/s also as persistent symptoms

## 2014-04-26 NOTE — Progress Notes (Signed)
   Subjective:    Patient ID: Nicholas [name], male    DOB: 08-16-1955, 58 y.o.   MRN: [id number]  HPI 3 day h/o excessive cough and chest congestion, sputum is grey and  thick and at times he experiences chills, but has had nom documented fever C/o increased and uncontrolled left knee pain , denies instability Frustrated with inability to lose weight, unwilling to try for bariatric surgery again Increased depression and chronic fatigue, was on testosterone in the past , will need urology supervision for this   Review of Systems See HPI C/o chronic fatigue Denies sinus pressure, nasal congestion, ear pain or sore throat.  Denies chest pains, palpitations and leg swelling Denies abdominal pain, nausea, vomiting,diarrhea or constipation.   Denies dysuria, frequency, hesitancy or incontinence.  Denies headaches, seizures, numbness, or tingling.  Denies skin break down or rash.        Objective:   Physical Exam BP 120/70  Pulse 91  Temp(Src) 98.3 F (36.8 C) (Oral)  Resp 16  Ht 6' (1.829 m)  Wt 429 lb (194.593 kg)  BMI 58.17 kg/m2  SpO2 94% Patient alert and oriented and in moderate cardiopulmonary distress.Excessive cough  HEENT: No facial asymmetry, EOMI,   oropharynx pink and moist.  Neck supple no JVD, no mass.  Chest: bilateral crackles, no wheezes  CVS: S1, S2 no murmurs, no S3.Regular rate.  ABD: Soft non tender.   Ext: No edema  MS: decreased  ROM spine,  And left  knee.  Skin: Intact, no ulcerations or rash noted.  Psych: Good eye contact, normal affect. Memory intact not anxious mildly  depressed appearing.  CNS: CN 2-12 intact, power,  normal throughout.no focal deficits noted.        Assessment & Plan:  Acute bacterial bronchitis Antibiotic prescribed for 10 days, needs CXR and sputum c/s also as persistent symptoms  Left knee pain Uncontrolled, toradol in office along with oral pain med Weight loss will clearly be beneficial and is  necessary, pt trying to work on this, no success still unwilling to restart the process of surgical intervention which he needs  Type 2 diabetes mellitus with diabetic nephropathy Treated by endo, updated lab to be obtained from endo Pt encouraged to be diligent with treatment plan as far as diet, medication , physical activity and testing is concerned  DEPRESSION/ANXIETY Uncontrolled, not suicidal or homicidal, dose increase in medication needed and is being started at thsi visit with follow up  MORBID OBESITY Unchanged. Patient re-educated about  the importance of commitment to a  minimum of 150 minutes of exercise per week. The importance of healthy food choices with portion control discussed. Encouraged to start a food diary, count calories and to consider  joining a support group. Sample diet sheets offered. Goals set by the patient for the next several months.

## 2014-04-26 NOTE — Assessment & Plan Note (Signed)
Uncontrolled, not suicidal or homicidal, dose increase in medication needed and is being started at [facility] visit with follow up

## 2014-04-26 NOTE — Assessment & Plan Note (Addendum)
Treated by endo, updated lab to be obtained from endo Pt encouraged to be diligent with treatment plan as far as diet, medication , physical activity and testing is concerned

## 2014-04-26 NOTE — Assessment & Plan Note (Signed)
Unchanged. Patient re-educated about  the importance of commitment to a  minimum of 150 minutes of exercise per week. The importance of healthy food choices with portion control discussed. Encouraged to start a food diary, count calories and to consider  joining a support group. Sample diet sheets offered. Goals set by the patient for the next several months.    

## 2014-04-26 NOTE — Assessment & Plan Note (Signed)
Uncontrolled, toradol in office along with oral pain med Weight loss will clearly be beneficial and is necessary, pt trying to work on this, no success still unwilling to restart the process of surgical intervention which he needs

## 2014-04-28 DIAGNOSIS — Z113 Encounter for screening for infections with a predominantly sexual mode of transmission: Secondary | ICD-10-CM | POA: Insufficient documentation

## 2014-04-28 NOTE — Assessment & Plan Note (Signed)
Controlled, no change in medication DASH diet and commitment to daily physical activity for a minimum of 30 minutes discussed and encouraged, as a part of hypertension management. The importance of attaining a healthy weight is also discussed.  

## 2014-04-28 NOTE — Assessment & Plan Note (Signed)
Pt requests STD screening though denies recent/ current sexual activity has concerns about possible exposure. Blood tests are ordered,and safe sex practice reviewed with condom use

## 2014-04-28 NOTE — Assessment & Plan Note (Signed)
Deteriorated. Patient re-educated about  the importance of commitment to a  minimum of 150 minutes of exercise per week. The importance of healthy food choices with portion control discussed. Encouraged to start a food diary, count calories and to consider  joining a support group. Sample diet sheets offered. Goals set by the patient for the next several months.    

## 2014-04-28 NOTE — Progress Notes (Signed)
Subjective:    Patient ID: Nicholas [name], male    DOB: 1955/12/18, 58 y.o.   MRN: [id number]  HPI The PT is here for follow up and re-evaluation of chronic medical conditions, medication management and review of any available recent lab and radiology data.  Preventive health is updated, specifically  Cancer screening and Immunization.   Followed by endo for his blood sugar, states that sugar continues to be uncontrolled but he is working on this, has fluctuation in blood sugars, no hypoglycemic episodes The PT denies any adverse reactions to current medications since the last visit.  C/oitching and flaking of scalp and areas of his face, also itchy rash in moist areas, no fever, chills or purulent drainage C/o ongoing fatigue , no real response to high doses of antidepressant meds, I again encourage help from psych but he is not interested in this . He is not suicidal or homicidal but is extremely discouraged by weight re gain and less ability to exercise as he had been.Feels as though the tesoterone supplement he had taken in the past was beneficial however unwilling at this time to see urologist for this         Review of Systems See HPI Denies recent fever or chills. Denies sinus pressure, nasal congestion, ear pain or sore throat. Denies chest congestion, productive cough or wheezing. Denies chest pains, palpitations and leg swelling Denies abdominal pain, nausea, vomiting,diarrhea or constipation.   Chronic Denies joint pain,  and limitation in mobility. Denies headaches, seizures, numbness, or tingling. C/o  depression,  Denies anxiety or insomnia.       Objective:   Physical Exam BP 118/76  Pulse 73  Resp 16  Ht 6' (1.829 m)  Wt 447 lb 12.8 oz (203.121 kg)  BMI 60.72 kg/m2  SpO2 94% Patient alert and oriented and in no cardiopulmonary distress.morbidly obese, depressed appearing  HEENT: No facial asymmetry, EOMI,   oropharynx pink and moist.  Neck supple no  JVD, no mass.  Chest: Clear to auscultation bilaterally.  CVS: S1, S2 no murmurs, no S3.Regular rate.  ABD: Soft non tender.   Ext: No edema  MS: decreased ROM spine , hips and knees  Skin: candidiasis as well as fungal skin infection present , no bacterial superinfection.  Psych: Good eye contact, flat  affect. Memory intact not anxious but  depressed appearing.  CNS: CN 2-12 intact, power,  normal throughout.no focal deficits noted.        Assessment & Plan:  Hypertension goal BP (blood pressure) < 130/80 Controlled, no change in medication DASH diet and commitment to daily physical activity for a minimum of 30 minutes discussed and encouraged, as a part of hypertension management. The importance of attaining a healthy weight is also discussed.   Type 2 diabetes mellitus with diabetic nephropathy Deteriorated and uncontrolled Patient advised to reduce carb and sweets, commit to regular physical activity, take meds as prescribed, test blood as directed, and attempt to lose weight, to improve blood sugar control. Keep f/u with endo  Depression Inadequate control, not suicidal or homicidal, unwilling to see psyhc currently due to financial constraints Denies hallucinations  Hyperlipidemia LDL goal <100 Updated lab past due Hyperlipidemia:Low fat diet discussed and encouraged.    MORBID OBESITY Deteriorated. Patient re-educated about  the importance of commitment to a  minimum of 150 minutes of exercise per week. The importance of healthy food choices with portion control discussed. Encouraged to start a food diary, count calories and  to consider  joining a support group. Sample diet sheets offered. Goals set by the patient for the next several months.     Candidiasis Currently has acute skin infection, use of topical and oral medication indicated  Dermatomycosis Current flare, topical agent prescribed  Hypogonadism in male Has benefited in past from  testosterone supplementation, will need urology to manage this , opts to hold off at this time  Screening for STD (sexually transmitted disease) Pt requests STD screening though denies recent/ current sexual activity has concerns about possible exposure. Blood tests are ordered,and safe sex practice reviewed with condom use

## 2014-04-28 NOTE — Assessment & Plan Note (Signed)
Deteriorated and uncontrolled Patient advised to reduce carb and sweets, commit to regular physical activity, take meds as prescribed, test blood as directed, and attempt to lose weight, to improve blood sugar control. Keep f/u with endo

## 2014-04-28 NOTE — Assessment & Plan Note (Signed)
Inadequate control, not suicidal or homicidal, unwilling to see psyhc currently due to financial constraints Denies hallucinations

## 2014-04-28 NOTE — Assessment & Plan Note (Signed)
Current flare, topical agent prescribed

## 2014-04-28 NOTE — Assessment & Plan Note (Signed)
Has benefited in past from testosterone supplementation, will need urology to manage this , opts to hold off at this time

## 2014-04-28 NOTE — Assessment & Plan Note (Signed)
Currently has acute skin infection, use of topical and oral medication indicated

## 2014-04-28 NOTE — Assessment & Plan Note (Signed)
Updated lab past due Hyperlipidemia:Low fat diet discussed and encouraged.

## 2014-05-03 ENCOUNTER — Other Ambulatory Visit: Payer: Self-pay

## 2014-05-03 DIAGNOSIS — E785 Hyperlipidemia, unspecified: Secondary | ICD-10-CM

## 2014-05-03 DIAGNOSIS — E1121 Type 2 diabetes mellitus with diabetic nephropathy: Secondary | ICD-10-CM

## 2014-05-22 ENCOUNTER — Ambulatory Visit: Payer: Medicare HMO | Admitting: Family Medicine

## 2014-05-26 ENCOUNTER — Other Ambulatory Visit: Payer: Self-pay | Admitting: Family Medicine

## 2014-06-05 LAB — COMPLETE METABOLIC PANEL WITH GFR
ALT: 24 U/L (ref 0–53)
AST: 23 U/L (ref 0–37)
Albumin: 4.1 g/dL (ref 3.5–5.2)
Alkaline Phosphatase: 86 U/L (ref 39–117)
BUN: 21 mg/dL (ref 6–23)
CO2: 26 meq/L (ref 19–32)
Calcium: 9.7 mg/dL (ref 8.4–10.5)
Chloride: 99 mEq/L (ref 96–112)
Creat: 1.24 mg/dL (ref 0.50–1.35)
GFR, Est African American: 73 mL/min
GFR, Est Non African American: 63 mL/min
GLUCOSE: 315 mg/dL — AB (ref 70–99)
Potassium: 4.6 mEq/L (ref 3.5–5.3)
SODIUM: 136 meq/L (ref 135–145)
TOTAL PROTEIN: 6.7 g/dL (ref 6.0–8.3)
Total Bilirubin: 0.4 mg/dL (ref 0.2–1.2)

## 2014-06-05 LAB — MICROALBUMIN / CREATININE URINE RATIO
Creatinine, Urine: 52.9 mg/dL
Microalb Creat Ratio: 34 mg/g — ABNORMAL HIGH (ref 0.0–30.0)
Microalb, Ur: 1.8 mg/dL (ref <2.0)

## 2014-06-05 LAB — LIPID PANEL
CHOLESTEROL: 158 mg/dL (ref 0–200)
HDL: 37 mg/dL — AB (ref >39)
LDL Cholesterol: 57 mg/dL (ref 0–99)
Total CHOL/HDL Ratio: 4.3 Ratio
Triglycerides: 318 mg/dL — ABNORMAL HIGH (ref <150)
VLDL: 64 mg/dL — ABNORMAL HIGH (ref 0–40)

## 2014-06-11 ENCOUNTER — Ambulatory Visit: Payer: Medicare HMO | Admitting: Gastroenterology

## 2014-06-15 LAB — HEMOGLOBIN A1C: A1c: 9.1

## 2014-06-18 ENCOUNTER — Other Ambulatory Visit: Payer: Self-pay | Admitting: Family Medicine

## 2014-06-21 ENCOUNTER — Other Ambulatory Visit: Payer: Self-pay | Admitting: Family Medicine

## 2014-07-02 ENCOUNTER — Ambulatory Visit: Payer: Medicare HMO | Admitting: Family Medicine

## 2014-07-04 ENCOUNTER — Ambulatory Visit (INDEPENDENT_AMBULATORY_CARE_PROVIDER_SITE_OTHER): Payer: PPO | Admitting: Gastroenterology

## 2014-07-04 ENCOUNTER — Encounter: Payer: Self-pay | Admitting: Gastroenterology

## 2014-07-04 VITALS — BP 124/73 | HR 81 | Temp 96.6°F | Ht 72.0 in | Wt >= 6400 oz

## 2014-07-04 DIAGNOSIS — R1013 Epigastric pain: Secondary | ICD-10-CM

## 2014-07-04 DIAGNOSIS — Z86010 Personal history of colonic polyps: Secondary | ICD-10-CM

## 2014-07-04 NOTE — Progress Notes (Signed)
cc'ed to pcp °

## 2014-07-04 NOTE — Assessment & Plan Note (Signed)
Surveillance colonoscopy will be due in September 2018.

## 2014-07-04 NOTE — Assessment & Plan Note (Signed)
Doing much better at this time, occasional epigastric pain at this point.. Currently on pantoprazole 40 mg twice a day. Suspect he will require lifelong PPI therapy given he likely has silent reflux. Epigastric pain likely related to gastritis/healing antral ulcer. Continue PPI twice a day for now. We will reassess in 6 months and consider daily therapy at that time if he has been pain-free. In the interim he will let us know if his symptoms change or progress.

## 2014-07-04 NOTE — Patient Instructions (Signed)
1. Continue pantoprazole 40mg  twice daily before a meal.  2. Return to the office in six months. 3. Your next colonoscopy will be due in 02/2017. 4. Call with any further problems.

## 2014-07-04 NOTE — Progress Notes (Signed)
Primary Care Physician: Tula Nakayama, MD  Primary Gastroenterologist:  Garfield Cornea, MD   Chief Complaint  Patient presents with  . Follow-up    HPI: Nicholas [name] is a 59 y.o. male here for follow-up. He was last seen in the office back in August when he presented for midabdominal pain associated with nausea and due for surveillance colonoscopy given history of tubular adenomas in the past. EGD in September showed circumferential distal esophageal erosions, diffuse gastric erosions, 5 mm area of healing ulceration in the antrum. He had multiple colonic polyps the largest measuring proximally 6 mm pathology revealing tubular adenomas. Rectal polyp was hyperplastic. Gastric biopsy showed reactive gastropathy with no H pylori. His next colonoscopy will be in September 2018.  Clinically patient has been doing well. He will not on PPI therapy prior to his EGD. He has been on pantoprazole 40 mg twice a day for 3 months. Occasionally has abdominal pain but overall doing well. Never really had any reflux symptoms and was noted to have erosive reflux esophagitis on EGD. Denies dysphagia. No melena rectal bleeding. Bowel movements are regular.  Current Outpatient Prescriptions  Medication Sig Dispense Refill  . ACCU-CHEK AVIVA PLUS test strip TEST FOUR TIMES A DAY AS DIRECTED. 100 each 5  . aspirin (ASPIRIN LOW DOSE) 81 MG EC tablet Take 81 mg by mouth daily. Take one tablet by mouth once a day     . B-D INS SYR ULTRAFINE 1CC/31G 31G X 5/16" 1 ML MISC USE AS DIRECTED WITH INSULIN VIALS TWICE DAILY. 100 each 2  . B-D INS SYRINGE 0.5CC/31GX5/16 31G X 5/16" 0.5 ML MISC USE TO INJECT THREE TIMES DAILY. 100 each PRN  . brimonidine (ALPHAGAN P) 0.1 % SOLN Place 1 drop into both eyes 2 (two) times daily. One drop in each eye two times a day     . brimonidine-timolol (COMBIGAN) 0.2-0.5 % ophthalmic solution Place 1 drop into both eyes every 12 (twelve) hours. One drop in each eye two times a  day      . buPROPion (WELLBUTRIN XL) 150 MG 24 hr tablet TAKE ONE TABLET BY MOUTH DAILY. 30 tablet 1  . Canagliflozin (INVOKANA) 100 MG TABS Take 1 tablet by mouth daily.    . citalopram (CELEXA) 40 MG tablet TAKE 1 TABLET BY MOUTH ONCE A DAY. 30 tablet 1  . clotrimazole-betamethasone (LOTRISONE) cream APPLY TO AFFECTED AREAS TWICE DAILY. 45 g 0  . cyclobenzaprine (FLEXERIL) 10 MG tablet TAKE (1) TABLET BY MOUTH EVERY EIGHT HOURS AS NEEDED FOR MUSCLE SPASMS. 30 tablet 3  . exenatide (BYETTA 10 MCG PEN) 10 MCG/0.04ML SOLN Inject 10 mcg into the skin 2 (two) times daily with a meal.     . gabapentin (NEURONTIN) 300 MG capsule TAKE ONE CAPSULE BY MOUTH DAILY. 30 capsule 3  . insulin aspart (NOVOLOG) 100 UNIT/ML injection Inject 10 Units into the skin 3 (three) times daily before meals.    . insulin detemir (LEVEMIR) 100 UNIT/ML injection Inject 60 Units into the skin 2 (two) times daily.     . Insulin Pen Needle 31G X 8 MM MISC by Does not apply route. Use two times a day as needed     . ketoconazole (NIZORAL) 2 % shampoo APPLY TOPICALLY TWICE WEEKLY AS DIRECTED. 120 mL 2  . lamoTRIgine (LAMICTAL) 200 MG tablet TAKE 1 TABLET BY MOUTH ONCE A DAY. 30 tablet 1  . lisinopril (PRINIVIL,ZESTRIL) 5 MG tablet TAKE 1 TABLET BY MOUTH  ONCE A DAY. 30 tablet 1  . nystatin (MYCOSTATIN) powder APPLY TO AFFECTED AREA TWICE DAILY FOR 10 DAYS. 60 g 1  . nystatin-triamcinolone ointment (MYCOLOG) APPLY TO AFFECTED AREA TWICE DAILY. 60 g 1  . pantoprazole (PROTONIX) 40 MG tablet Take 40 mg by mouth 2 (two) times daily.    . simvastatin (ZOCOR) 40 MG tablet TAKE 1 TABLET BY MOUTH ONCE A DAY. 30 tablet 3  . Testosterone (ANDROGEL PUMP TD) Place 75 g onto the skin every morning.    . traMADol (ULTRAM) 50 MG tablet TAKE 1 TABLET BY MOUTH ONCE DAILY AS NEEDED FOR KNEE PAIN. 30 tablet 1  . Vitamin D, Ergocalciferol, (DRISDOL) 50000 UNITS CAPS capsule Take 50,000 Units by mouth every 7 (seven) days.    . polyethylene  glycol-electrolytes (TRILYTE) 420 G solution Take 4,000 mLs by mouth as directed. (Patient not taking: Reported on 07/04/2014) 4000 mL 0   No current facility-administered medications for this visit.    Allergies as of 07/04/2014  . (No Known Allergies)    ROS:  General: Negative for anorexia, weight loss, fever, chills, fatigue, weakness. ENT: Negative for hoarseness, difficulty swallowing , nasal congestion. CV: Negative for chest pain, angina, palpitations, dyspnea on exertion, peripheral edema.  Respiratory: Negative for dyspnea at rest, dyspnea on exertion, cough, sputum, wheezing.  GI: See history of present illness. GU:  Negative for dysuria, hematuria, urinary incontinence, urinary frequency, nocturnal urination.  Endo: Negative for unusual weight change.    Physical Examination:   BP 124/73 mmHg  Pulse 81  Temp(Src) 96.6 F (35.9 C)  Ht 6' (1.829 m)  Wt 437 lb 6.4 oz (198.403 kg)  BMI 59.31 kg/m2  General: Well-nourished, well-developed in no acute distress. Morbidly obese Eyes: No icterus. Mouth: Oropharyngeal mucosa moist and pink , no lesions erythema or exudate. Lungs: Clear to auscultation bilaterally.  Heart: Regular rate and rhythm, no murmurs rubs or gallops.  Abdomen: Bowel sounds are normal, nontender, nondistended, no abdominal bruits or hernia , no rebound or guarding.  Exam limited due to body habitus Extremities: No lower extremity edema. Chronic venous stasis changes No clubbing or deformities. Neuro: Alert and oriented x 4   Skin: Warm and dry, no jaundice.   Psych: Alert and cooperative, normal mood and affect.  Labs:  Lab Results  Component Value Date   WBC 8.8 11/28/2013   HGB 14.0 11/28/2013   HCT 42.5 11/28/2013   MCV 89.1 11/28/2013   PLT 211 11/28/2013   Lab Results  Component Value Date   ALT 24 06/04/2014   AST 23 06/04/2014   ALKPHOS 86 06/04/2014   BILITOT 0.4 06/04/2014   Lab Results  Component Value Date   CREATININE 1.24  06/04/2014   BUN 21 06/04/2014   NA 136 06/04/2014   K 4.6 06/04/2014   CL 99 06/04/2014   CO2 26 06/04/2014    Imaging Studies: No results found.

## 2014-07-09 ENCOUNTER — Ambulatory Visit (INDEPENDENT_AMBULATORY_CARE_PROVIDER_SITE_OTHER): Payer: PPO | Admitting: Family Medicine

## 2014-07-09 ENCOUNTER — Encounter: Payer: Self-pay | Admitting: Family Medicine

## 2014-07-09 VITALS — BP 134/82 | HR 74 | Resp 16 | Ht 72.0 in | Wt >= 6400 oz

## 2014-07-09 DIAGNOSIS — Z23 Encounter for immunization: Secondary | ICD-10-CM

## 2014-07-09 DIAGNOSIS — E785 Hyperlipidemia, unspecified: Secondary | ICD-10-CM

## 2014-07-09 DIAGNOSIS — A499 Bacterial infection, unspecified: Secondary | ICD-10-CM

## 2014-07-09 DIAGNOSIS — B9689 Other specified bacterial agents as the cause of diseases classified elsewhere: Secondary | ICD-10-CM

## 2014-07-09 DIAGNOSIS — E1121 Type 2 diabetes mellitus with diabetic nephropathy: Secondary | ICD-10-CM

## 2014-07-09 DIAGNOSIS — M549 Dorsalgia, unspecified: Secondary | ICD-10-CM

## 2014-07-09 DIAGNOSIS — L089 Local infection of the skin and subcutaneous tissue, unspecified: Secondary | ICD-10-CM

## 2014-07-09 MED ORDER — FLUCONAZOLE 150 MG PO TABS: 150.0000 mg | ORAL_TABLET | Freq: Every day | ORAL | Status: DC

## 2014-07-09 MED ORDER — CEPHALEXIN 250 MG PO CAPS: 250.0000 mg | ORAL_CAPSULE | Freq: Four times a day (QID) | ORAL | Status: DC

## 2014-07-09 MED ORDER — TERBINAFINE HCL 250 MG PO TABS: 250.0000 mg | ORAL_TABLET | Freq: Every day | ORAL | Status: DC

## 2014-07-09 MED ORDER — FLUCONAZOLE 100 MG PO TABS: 100.0000 mg | ORAL_TABLET | Freq: Every day | ORAL | Status: DC

## 2014-07-09 NOTE — Patient Instructions (Addendum)
F/u in 3 month, call if  You need me before  Medication is sent in for fungal and yeast skin infection   Please contact bariatric clinic and schedule appointment , surgery is much more readily available and you will benefit  Prevnar today   Sorry about confusion in appts  You do qualify diabetic shoes  You need eye exam and if you want podiartry referral to help with footcare pls let us know  Looking forward to better health this year, one day at a time!

## 2014-07-09 NOTE — Progress Notes (Signed)
   Subjective:    Patient ID: Nicholas [name], male    DOB: 1955/10/10, 59 y.o.   MRN: [id number]  HPI Pain and pus on penile skin for past 1 month, symptoms are aggravated with masturbation, and pt experiencing extreme sexual frustration as unable to masturbate for the past 1 month, due to skin irritation, wants help, no fever or chills, no urinary symptoms, no penile discharge. Needs prevnar vaccine and will accept at this visit Willing to re examine bariatric surgery for weight and health management and information provided Reports blood sugar fluctuation and lack of control, his diet is also uncontrolled Depression and anxiety are out of control, though he is neither suicidal or homicidal, he is more withdrawn in recent times, no interest in therapy, feels that weight loss and attention to skin infection will go a long way to him feeling better   Review of Systems See HPI Denies recent fever or chills. Denies sinus pressure, nasal congestion, ear pain or sore throat. Denies chest congestion, productive cough or wheezing. Denies chest pains, palpitations and leg swelling Denies abdominal pain, nausea, vomiting,diarrhea or constipation.   Denies dysuria, frequency, hesitancy or incontinence. Chronic back pain  and limitation in mobility. Denies headaches, seizures, numbness, or tingling.         Objective:   Physical Exam  BP 134/82 mmHg  Pulse 74  Resp 16  Ht 6' (1.829 m)  Wt 442 lb 1.9 oz (200.544 kg)  BMI 59.95 kg/m2  SpO2 92%   Patient alert and oriented and in no cardiopulmonary distress.  HEENT: No facial asymmetry, EOMI,   oropharynx pink and moist.  Neck supple no JVD, no mass.  Chest: Clear to auscultation bilaterally.  CVS: S1, S2 no murmurs, no S3.Regular rate.  ABD: Soft non tender.  Genital: exam not done, treated based on history provided Ext: No edema  MS: Decreased  ROM spine, shoulders, hips and knees.  Skin: Intact, no ulcerations or rash  noted.  Psych: Good eye contact, normal affect. Memory intact not anxious or depressed appearing.  CNS: CN 2-12 intact, power,  normal throughout.no focal deficits noted.       Assessment & Plan:  Bacterial infection of skin Penile skin infection, bacterial and fungal, antibiotic and antifungal prescribed, also treated for yeast infection Of note , pt is on invokana for diabetes and he may need to change this due to balanitis, he is to discuss with endo   MORBID OBESITY Deteriorated. Patient re-educated about  the importance of commitment to a  minimum of 150 minutes of exercise per week. The importance of healthy food choices with portion control discussed. Encouraged to start a food diary, count calories and to consider  joining a support group. Sample diet sheets offered. Goals set by the patient for the next several months.   Pt strongly encouraged to attempt to get bariatric surgery, he has had multiple attempts at weight loss in th past with some success , then re gaining weight    Back pain with radiation Controlled, no change in medication    Need for vaccination with 13-polyvalent pneumococcal conjugate vaccine Vaccine administered at visit.    Hyperlipidemia LDL goal <100 Uncontrolled, elevated Tg Hyperlipidemia:Low fat diet discussed and encouraged.   Need to rept in April, if still elevated , will add zetia

## 2014-07-22 DIAGNOSIS — R0789 Other chest pain: Secondary | ICD-10-CM | POA: Insufficient documentation

## 2014-07-22 DIAGNOSIS — R109 Unspecified abdominal pain: Secondary | ICD-10-CM | POA: Insufficient documentation

## 2014-07-22 NOTE — Assessment & Plan Note (Signed)
Controlled, no change in medication DASH diet and commitment to daily physical activity for a minimum of 30 minutes discussed and encouraged, as a part of hypertension management. The importance of attaining a healthy weight is also discussed.  

## 2014-07-22 NOTE — Assessment & Plan Note (Signed)
Vaccine administered at visit.  

## 2014-07-22 NOTE — Assessment & Plan Note (Signed)
Uncontrolled, pt to d/c meloxicam, he will start once daily hydrocodone , pain contract signed at [place] loss encouraged which is a major source of frustration for him

## 2014-07-22 NOTE — Assessment & Plan Note (Signed)
Controlled, no change in medication  

## 2014-07-22 NOTE — Assessment & Plan Note (Addendum)
EKG in office shows normal sinus nrythm with no ischemic changes, despite multiple CV risk factors

## 2014-07-22 NOTE — Assessment & Plan Note (Signed)
Deteriorated. Patient re-educated about  the importance of commitment to a  minimum of 150 minutes of exercise per week. The importance of healthy food choices with portion control discussed. Encouraged to start a food diary, count calories and to consider  joining a support group. Sample diet sheets offered. Goals set by the patient for the next several months.   Pt strongly encouraged to attempt to get bariatric surgery, he has had multiple attempts at weight loss in th past with some success , then re gaining weight

## 2014-07-22 NOTE — Assessment & Plan Note (Signed)
Penile skin infection, bacterial and fungal, antibiotic and antifungal prescribed, also treated for yeast infection Of note , pt is on invokana for diabetes and he may need to change this due to balanitis, he is to discuss with endo

## 2014-07-22 NOTE — Assessment & Plan Note (Signed)
Deteriorated. Patient re-educated about  the importance of commitment to a  minimum of 150 minutes of exercise per week. The importance of healthy food choices with portion control discussed. Encouraged to start a food diary, count calories and to consider  joining a support group. Sample diet sheets offered. Goals set by the patient for the next several months.    

## 2014-07-22 NOTE — Assessment & Plan Note (Signed)
Uncontrolled, elevated Tg Hyperlipidemia:Low fat diet discussed and encouraged.   Need to rept in April, if still elevated , will add zetia

## 2014-07-22 NOTE — Assessment & Plan Note (Signed)
Uncontrolled, treated by endo, recent deterioration in control and over all health, encoyuraged pt to again re commit to improving his health Patient advised to reduce carb and sweets, commit to regular physical activity, take meds as prescribed, test blood as directed, and attempt to lose weight, to improve blood sugar control.

## 2014-07-22 NOTE — Assessment & Plan Note (Signed)
Refer to [facility] for furhter eval, pt has h/o colon polyps

## 2014-07-23 ENCOUNTER — Other Ambulatory Visit: Payer: Self-pay | Admitting: Internal Medicine

## 2014-07-24 ENCOUNTER — Telehealth: Payer: Self-pay | Admitting: *Deleted

## 2014-07-24 NOTE — Telephone Encounter (Signed)
Pt called stating he is still having the same problem he had when he last saw Dr. Moshe Cipro and pt wants to talk with a nurse. Please advise

## 2014-07-25 NOTE — Telephone Encounter (Signed)
Patient c/o penile pain.  States that the pain returned after completing abt.  Would like to know is anything else can be prescribed.  No drainage noted.  No fever or chills

## 2014-07-25 NOTE — Telephone Encounter (Signed)
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

## 2014-07-25 NOTE — Telephone Encounter (Signed)
Spoke with pt , he will use KY jelly , as discomfort occurs only during masturbation and it is a burning feeling, he denies skin breakdown or penile d/c States he went to orientation class for gastric bypass and has paperwork to be completed here, he will drop off

## 2014-08-22 ENCOUNTER — Other Ambulatory Visit: Payer: Self-pay | Admitting: Family Medicine

## 2014-09-16 ENCOUNTER — Other Ambulatory Visit: Payer: Self-pay | Admitting: Family Medicine

## 2014-09-18 ENCOUNTER — Ambulatory Visit (INDEPENDENT_AMBULATORY_CARE_PROVIDER_SITE_OTHER): Payer: PPO | Admitting: Urology

## 2014-09-18 DIAGNOSIS — E291 Testicular hypofunction: Secondary | ICD-10-CM

## 2014-09-18 LAB — HEMOGLOBIN A1C: Hgb A1c MFr Bld: 11.3 % — AB (ref 4.0–6.0)

## 2014-09-19 ENCOUNTER — Other Ambulatory Visit: Payer: Self-pay | Admitting: Family Medicine

## 2014-10-02 ENCOUNTER — Other Ambulatory Visit: Payer: Self-pay | Admitting: Family Medicine

## 2014-10-15 ENCOUNTER — Ambulatory Visit (INDEPENDENT_AMBULATORY_CARE_PROVIDER_SITE_OTHER): Payer: PPO | Admitting: Family Medicine

## 2014-10-15 ENCOUNTER — Encounter: Payer: Self-pay | Admitting: Family Medicine

## 2014-10-15 VITALS — BP 114/68 | HR 78 | Resp 18 | Ht 74.0 in | Wt >= 6400 oz

## 2014-10-15 DIAGNOSIS — E785 Hyperlipidemia, unspecified: Secondary | ICD-10-CM

## 2014-10-15 DIAGNOSIS — E1121 Type 2 diabetes mellitus with diabetic nephropathy: Secondary | ICD-10-CM

## 2014-10-15 DIAGNOSIS — E559 Vitamin D deficiency, unspecified: Secondary | ICD-10-CM

## 2014-10-15 DIAGNOSIS — M25531 Pain in right wrist: Secondary | ICD-10-CM

## 2014-10-15 DIAGNOSIS — R251 Tremor, unspecified: Secondary | ICD-10-CM

## 2014-10-15 DIAGNOSIS — Z0000 Encounter for general adult medical examination without abnormal findings: Secondary | ICD-10-CM

## 2014-10-15 NOTE — Patient Instructions (Addendum)
F/u in 4 month, call if you need me before  [facility]!  You are referred to nutrtionist for help with weight and diabetes  Fasting lipid, cmp and EGFr and TSH and vit D in 4 month  You are referred to Dr Merlene Laughter re pain in right wrist and also to assess tremor  Try  To start walking for 15 minutes twice daily

## 2014-10-15 NOTE — Assessment & Plan Note (Signed)

## 2014-10-15 NOTE — Assessment & Plan Note (Signed)
Right wrist pain, has benefited in the past from injections from neurology, will refer for re evaluation , also to evaluate  tremor

## 2014-10-15 NOTE — Progress Notes (Signed)
Subjective:    Patient ID: Nicholas [name], male    DOB: 1956-01-16, 59 y.o.   MRN: [id number]  HPI Preventive Screening-Counseling & Management   Patient present here today for a Medicare annual wellness visit.   Current Problems (verified)   Medications Prior to Visit Allergies (verified)   PAST HISTORY  Family History (updated)  Social History Disabled truck Geophysicist/field seismologist; single   Risk Factors  Current exercise habits:  Limited but patient is motivated   Dietary issues discussed:  Heart healthy low carb diet , reduced fat   Cardiac risk factors:  Age/ weight , IDDM  Depression Screen  (Note: if answer to either of the following is "Yes", a more complete depression screening is indicated)   Over the past two weeks, have you felt down, depressed or hopeless? No  Over the past two weeks, have you felt little interest or pleasure in doing things? No  Have you lost interest or pleasure in daily life? No  Do you often feel hopeless? No  Do you cry easily over simple problems? No   Activities of Daily Living  In your present state of health, do you have any difficulty performing the following activities?  Driving?: No Managing money?: No Feeding yourself?:No Getting from bed to chair?:No Climbing a flight of stairs?: Yes due to phobia  Preparing food and eating?:No Bathing or showering?:No Getting dressed?:No Getting to the toilet?:No Using the toilet?:No Moving around from place to place?: No  Fall Risk Assessment In the past year have you fallen or had a near fall?:No Are you currently taking any medications that make you dizzy?:No   Hearing Difficulties: No Do you often ask people to speak up or repeat themselves?:No Do you experience ringing or noises in your ears?:No Do you have difficulty understanding soft or whispered voices?:No  Cognitive Testing  Alert? Yes Normal Appearance?Yes  Oriented to person? Yes Place? Yes  Time? Yes  Displays appropriate  judgment?Yes  Can read the correct time from a watch face? yes Are you having problems remembering things? Yes, at times misplaces items   Advanced Directives have been discussed with the patient?Yes and brochure provided, full code   List the Names of Other Physician/Practitioners you currently use:  Care Teams updated    Indicate any recent Medical Services you may have received from other than [facility] providers in the past year (date may be approximate).   Assessment:    Annual Wellness Exam   Plan:     Medicare Attestation  I have personally reviewed:  The patient's medical and social history  Their use of alcohol, tobacco or illicit drugs  Their current medications and supplements  The patient's functional ability including ADLs,fall risks, home safety risks, cognitive, and hearing and visual impairment  Diet and physical activities  Evidence for depression or mood disorders  The patient's weight, height, BMI, and visual acuity have been recorded in the chart. I have made referrals, counseling, and provided education to the patient based on review of the above and I have provided the patient with a written personalized care plan for preventive services.      Review of Systems     Objective:   Physical Exam BP 114/68 mmHg  Pulse 78  Resp 18  Ht 6\' 2"  (1.88 m)  Wt 428 lb (194.14 kg)  BMI 54.93 kg/m2  SpO2 98%        Assessment & Plan:  Medicare annual wellness visit, subsequent Annual exam as documented.  Counseling done  re healthy lifestyle involving commitment to 150 minutes exercise per week, heart healthy diet, and attaining healthy weight.The importance of adequate sleep also discussed. Regular seat belt use and home safety, is also discussed. Changes in health habits are decided on by the patient with goals and time frames  set for achieving them. Immunization and cancer screening needs are specifically addressed at this visit.     Wrist pain,  right Right wrist pain, has benefited in the past from injections from neurology, will refer for re evaluation , also to evaluate  tremor

## 2014-10-16 ENCOUNTER — Encounter: Payer: Self-pay | Admitting: Family Medicine

## 2014-10-18 ENCOUNTER — Telehealth: Payer: Self-pay

## 2014-10-18 NOTE — Telephone Encounter (Signed)
Patient aware and rx faxed to [facility]

## 2014-10-18 NOTE — Telephone Encounter (Signed)
I will write this and give it to you

## 2014-10-25 ENCOUNTER — Telehealth: Payer: Self-pay | Admitting: Family Medicine

## 2014-10-25 NOTE — Telephone Encounter (Signed)
Left message on cell number that Dr Alessandra Grout office will call when they schedule his appointment

## 2014-10-31 ENCOUNTER — Ambulatory Visit (INDEPENDENT_AMBULATORY_CARE_PROVIDER_SITE_OTHER): Payer: PPO | Admitting: Podiatry

## 2014-10-31 ENCOUNTER — Encounter: Payer: Self-pay | Admitting: Podiatry

## 2014-10-31 VITALS — BP 134/76 | HR 76 | Resp 18

## 2014-10-31 DIAGNOSIS — B351 Tinea unguium: Secondary | ICD-10-CM | POA: Diagnosis not present

## 2014-10-31 DIAGNOSIS — L97921 Non-pressure chronic ulcer of unspecified part of left lower leg limited to breakdown of skin: Secondary | ICD-10-CM

## 2014-10-31 DIAGNOSIS — M79676 Pain in unspecified toe(s): Secondary | ICD-10-CM | POA: Diagnosis not present

## 2014-10-31 DIAGNOSIS — E1140 Type 2 diabetes mellitus with diabetic neuropathy, unspecified: Secondary | ICD-10-CM

## 2014-10-31 DIAGNOSIS — E1149 Type 2 diabetes mellitus with other diabetic neurological complication: Secondary | ICD-10-CM

## 2014-10-31 NOTE — Patient Instructions (Signed)
Diabetes and Foot Care Diabetes may cause you to have problems because of poor blood supply (circulation) to your feet and legs. This may cause the skin on your feet to become thinner, break easier, and heal more slowly. Your skin may become dry, and the skin may peel and crack. You may also have nerve damage in your legs and feet causing decreased feeling in them. You may not notice minor injuries to your feet that could lead to infections or more serious problems. Taking care of your feet is one of the most important things you can do for yourself.  HOME CARE INSTRUCTIONS  Wear shoes at all times, even in the house. Do not go barefoot. Bare feet are easily injured.  Check your feet daily for blisters, cuts, and redness. If you cannot see the bottom of your feet, use a mirror or ask someone for help.  Wash your feet with warm water (do not use hot water) and mild soap. Then pat your feet and the areas between your toes until they are completely dry. Do not soak your feet as this can dry your skin.  Apply a moisturizing lotion or petroleum jelly (that does not contain alcohol and is unscented) to the skin on your feet and to dry, brittle toenails. Do not apply lotion between your toes.  Trim your toenails straight across. Do not dig under them or around the cuticle. File the edges of your nails with an emery board or nail file.  Do not cut corns or calluses or try to remove them with medicine.  Wear clean socks or stockings every day. Make sure they are not too tight. Do not wear knee-high stockings since they may decrease blood flow to your legs.  Wear shoes that fit properly and have enough cushioning. To break in new shoes, wear them for just a few hours a day. This prevents you from injuring your feet. Always look in your shoes before you put them on to be sure there are no objects inside.  Do not cross your legs. This may decrease the blood flow to your feet.  If you find a minor scrape,  cut, or break in the skin on your feet, keep it and the skin around it clean and dry. These areas may be cleansed with mild soap and water. Do not cleanse the area with peroxide, alcohol, or iodine.  When you remove an adhesive bandage, be sure not to damage the skin around it.  If you have a wound, look at it several times a day to make sure it is healing.  Do not use heating pads or hot water bottles. They may burn your skin. If you have lost feeling in your feet or legs, you may not know it is happening until it is too late.  Make sure your health care provider performs a complete foot exam at least annually or more often if you have foot problems. Report any cuts, sores, or bruises to your health care provider immediately. SEEK MEDICAL CARE IF:   You have an injury that is not healing.  You have cuts or breaks in the skin.  You have an ingrown nail.  You notice redness on your legs or feet.  You feel burning or tingling in your legs or feet.  You have pain or cramps in your legs and feet.  Your legs or feet are numb.  Your feet always feel cold. SEEK IMMEDIATE MEDICAL CARE IF:   There is increasing redness,   swelling, or pain in or around a wound.  There is a red line that goes up your leg.  Pus is coming from a wound.  You develop a fever or as directed by your health care provider.  You notice a bad smell coming from an ulcer or wound. Document Released: 06/12/2000 Document Revised: 02/15/2013 Document Reviewed: 11/22/2012 ExitCare Patient Information 2015 [employer]. This information is not intended to replace advice given to you by your health care provider. Make sure you discuss any questions you have with your health care provider.  

## 2014-10-31 NOTE — Progress Notes (Addendum)
   Subjective:    Patient ID: Nicholas [name], male    DOB: May 01, 1956, 59 y.o.   MRN: [id number]  HPI  59 year old male presents the office today for diabetic risk assessment as well as for painful, elongated toenails which he is unable to trim himself. He is diabetic and states that his last blood sugar was 179. He does have history of a wound in MRSA to the right fifth toe several years ago. He does have neuropathy. Denies any claudication symptoms. He did state of the day he hit his left leg resulting in a small wound which had a scab. The scab fell off this morning. Denies any drainage or any surrounding redness. No other complaints at this time.   Review of Systems  Endocrine:       EXCESSIVE THIRST  Musculoskeletal: Positive for back pain.       JOINT AND MUSCLE PAIN  All other systems reviewed and are negative.      Objective:   Physical Exam AAO 3, NAD DP/PT pulses palpable, CRT less than 3 seconds Protective sensation decreased with Simms Weinstein monofilament, decreased vibratory sensation, Achilles tendon reflex intact. Nails are hypertrophic, dystrophic, elongated, brittle, discolored 10. There is subjective tenderness on nails modified bilaterally. No swelling erythema or drainage. On the distal medial left leg there is a superficial abrasion without any surrounding erythema, drainage, ascending cellulitis or other clinical signs of infection. No other open lesions or pre-ulcer lesions identified bilaterally. There is chronic discoloration to bilateral lower extremities. For the patient this color change is not new has been ongoing for several years. No pain with calf compression, swelling, warmth, erythema.      Assessment & Plan:  59 year old male with symptoms onychomycosis, left leg ulceration -Treatment options were discussed include alternatives, risks, complications. -Nail sharply debrided 10 without, location/bleeding -Recommended antibiotic ointment and a  bandage over the wound to the left leg. Monitoring signs or symptoms of infection and directed to call the office to be lesion any occur go to the ER. Also follow-up in 2 weeks of the wound does not healed. -Discussed importance daily foot inspection. -Diabetic shoe paperwork was completed for precertification. He was also measured for shoes while to his appointment. - Follow-up in 3 months or sooner if any problems are to arise. In the meantime call the office any questions, concerns, changes symptoms.

## 2014-11-01 ENCOUNTER — Encounter: Payer: Self-pay | Admitting: Podiatry

## 2014-11-15 ENCOUNTER — Other Ambulatory Visit: Payer: Self-pay | Admitting: Family Medicine

## 2014-11-28 ENCOUNTER — Telehealth: Payer: Self-pay | Admitting: Family Medicine

## 2014-11-29 ENCOUNTER — Encounter: Payer: Self-pay | Admitting: Family Medicine

## 2014-11-29 NOTE — Telephone Encounter (Signed)
rx sent to [facility]

## 2014-12-11 ENCOUNTER — Encounter: Payer: Self-pay | Admitting: Internal Medicine

## 2014-12-16 ENCOUNTER — Other Ambulatory Visit: Payer: Self-pay | Admitting: Family Medicine

## 2014-12-18 ENCOUNTER — Other Ambulatory Visit: Payer: Self-pay | Admitting: Family Medicine

## 2014-12-30 ENCOUNTER — Other Ambulatory Visit: Payer: Self-pay | Admitting: Gastroenterology

## 2015-01-08 ENCOUNTER — Telehealth: Payer: Self-pay | Admitting: *Deleted

## 2015-01-08 NOTE — Telephone Encounter (Signed)
Lasix has not been refilled since 2013. Will call patient for more information

## 2015-01-08 NOTE — Telephone Encounter (Signed)
Pt called requesting flurosemide fliud pill to be refilled at [facility]. Please advise

## 2015-01-09 ENCOUNTER — Other Ambulatory Visit: Payer: Self-pay

## 2015-01-09 MED ORDER — FUROSEMIDE 20 MG PO TABS: 20.0000 mg | ORAL_TABLET | Freq: Every day | ORAL | Status: DC

## 2015-01-09 MED ORDER — POTASSIUM CHLORIDE CRYS ER 20 MEQ PO TBCR
20.0000 meq | EXTENDED_RELEASE_TABLET | Freq: Every day | ORAL | Status: DC
Start: 2015-01-09 — End: 2015-12-31

## 2015-01-09 NOTE — Telephone Encounter (Signed)
pls send lasix 20 mg daily and potassium 20 meq daily as needed x 2 month

## 2015-01-09 NOTE — Telephone Encounter (Signed)
States he feels swollen in his legs and abdomen and he had been eating excessive salt but he has cut back and wants to know if he can have a low dose lasix to help with the problem.  Please advise

## 2015-01-15 ENCOUNTER — Encounter: Payer: Self-pay | Admitting: Nurse Practitioner

## 2015-01-15 ENCOUNTER — Ambulatory Visit (INDEPENDENT_AMBULATORY_CARE_PROVIDER_SITE_OTHER): Payer: PPO | Admitting: Nurse Practitioner

## 2015-01-15 VITALS — BP 130/67 | HR 85 | Temp 98.4°F | Ht 69.0 in | Wt >= 6400 oz

## 2015-01-15 DIAGNOSIS — R1900 Intra-abdominal and pelvic swelling, mass and lump, unspecified site: Secondary | ICD-10-CM

## 2015-01-15 DIAGNOSIS — K2970 Gastritis, unspecified, without bleeding: Secondary | ICD-10-CM | POA: Diagnosis not present

## 2015-01-15 DIAGNOSIS — R109 Unspecified abdominal pain: Secondary | ICD-10-CM

## 2015-01-15 NOTE — Assessment & Plan Note (Signed)
59 year old male with distal esophageal erosions and diffuse gastric erosions with a 5 mm area of healing ulceration in the antrum on his last endoscopy completed 03/23/2014. He continues on twice a day PPI. At his last office visit he was doing quite well. He is having some abdominal pain currently, however I suspect this is a different situation than his chronic gastritis. At this point I will leave him on twice a day PPI due to the possibility there is some relation with his current symptoms to his gastritis and so we can further determine. Return for follow-up in 3 months.

## 2015-01-15 NOTE — Assessment & Plan Note (Addendum)
59 year old male presents with abdominal pain as noted in the abdominal pain assessment and plan note. There is also an area any associated region with increased firmness and possibly increased bulging with abdominal flexion. The pain is increased with palpation to this area. It is difficult to fully appreciate and characterize due to his increased abdominal girth. I suspect he may have an abdominal hernia but cannot exclude other more insidious process. Given his symptoms including significant abdominal pain as well this area of bulging, I will order a CT of the abdomen and a bariatric scanner to further evaluate. His colonoscopy is up-to-date. Return for further evaluation in 3 months.

## 2015-01-15 NOTE — Progress Notes (Signed)
Referring Provider: Fayrene Helper, MD Primary Care Physician:  Tula Nakayama, MD Primary GI:  Dr. Gala Romney  Chief Complaint  Patient presents with  . Follow-up    HPI:   59 year old male presents for follow-up on abdominal pain. Colonoscopy up-to-date in performed on 03/23/2014 with multiple polyps including tubular adenoma. Recommend repeat colonoscopy in 3 years. Endoscopy completed on the same day for dyspepsia and epigastric pain under conscious sedation found circumferential distal esophageal erosions, no Barrett's esophagus, diffuse gastric erosions, 5 mm area of healing ulceration in the antrum. The patient was started on twice a day PPI. As last office visit he was doing quite well with minimal rake through symptoms. Was recommended for 6 month reevaluation and possible stepdown of his PPI to once daily if he continued to be doing so well.  Today he states he's having recurrent abdominal pain and feels like there's a knott in his abdomen which he feels has grown. His pain and location is the same as it was, but is worsening now with pressing on the area. Hurts about a 9/10, constant pain. Denies N/V. Pain is described as "a stomach ache" and unable to further qualify. States if you push on it then it becomes sharp. Is still taking PPI bid. Denies hematochezia and melena. No changes in bowel habits, fevers of unknown origin, unintnetional weight loss. Admits occasional lightheadedness which is chronic for him. Denies chest pain, dyspnea, dizziness, lightheadedness, syncope, near syncope. Denies any other upper or lower GI symptoms.  Past Medical History  Diagnosis Date  . Diabetes   . Hyperlipidemia   . Testosterone deficiency   . Hypertension   . Poor circulation     leg   . MRSA (methicillin resistant Staphylococcus aureus)   . Chronic knee pain   . Depression   . Glaucoma   . Suicide attempt     Past Surgical History  Procedure Laterality Date  . Kidney stones      . Colonoscopy  03/2008    URK:YHCWCB external hemorrhoidal tag, otherwise normal rectum/Two diminutive rectosigmoid polyps s/p bx/ Polyp in the opposite the ileocecal valve s/p bx. tubular adenoma.  . Colonoscopy N/A 03/23/2014    JSE:[id number] colon polyps throughout his rectum and colon. Sessile and pedunculated. The largest polyp was proximally 6 mm in dimensions in the retum at 3 cm with adjacent diminutive polyp.  There was also a polyps on ileocecal valve, ascending segmen descending and sigmoid segment. as the colon was tortuous & elongated requiring externa abd pressure and changing of the pts postion to reach cecum  . Esophagogastroduodenoscopy N/A 03/23/2014    RMR: 1. Patient had circumferential distal esophageal erosions within 5 mm of the GE junction.  No Barrett's esophagus. Tubular esophagus patent throughout its course. 2.  Diffuse gastric erosions. (1) 76mm area of healing ulceration in the antrum. No ulcer or infiltrating process observed. Patent pylorus. Normal-appearing first and second portion of the duodenum .    Current Outpatient Prescriptions  Medication Sig Dispense Refill  . ACCU-CHEK AVIVA PLUS test strip TEST FOUR TIMES A DAY AS DIRECTED. 100 each 5  . aspirin (ASPIRIN LOW DOSE) 81 MG EC tablet Take 81 mg by mouth daily. Take one tablet by mouth once a day     . B-D INS SYR ULTRAFINE 1CC/31G 31G X 5/16" 1 ML MISC USE AS DIRECTED WITH INSULIN VIALS TWICE DAILY. 100 each 2  . B-D INS SYRINGE 0.5CC/31GX5/16 31G X 5/16" 0.5 ML MISC USE TO  INJECT THREE TIMES DAILY. 100 each PRN  . brimonidine (ALPHAGAN P) 0.1 % SOLN Place 1 drop into both eyes 2 (two) times daily. One drop in each eye two times a day     . brimonidine-timolol (COMBIGAN) 0.2-0.5 % ophthalmic solution Place 1 drop into both eyes every 12 (twelve) hours. One drop in each eye two times a day      . buPROPion (WELLBUTRIN XL) 150 MG 24 hr tablet TAKE ONE TABLET BY MOUTH DAILY. 30 tablet 2  . citalopram (CELEXA) 40  MG tablet TAKE 1 TABLET BY MOUTH ONCE A DAY. 30 tablet 4  . clotrimazole-betamethasone (LOTRISONE) cream APPLY TO AFFECTED AREAS TWICE DAILY. 45 g 0  . cyclobenzaprine (FLEXERIL) 10 MG tablet TAKE (1) TABLET BY MOUTH EVERY EIGHT HOURS AS NEEDED FOR MUSCLE SPASMS. 30 tablet 3  . furosemide (LASIX) 20 MG tablet Take 1 tablet (20 mg total) by mouth daily. 30 tablet 1  . gabapentin (NEURONTIN) 300 MG capsule TAKE ONE CAPSULE BY MOUTH DAILY. 30 capsule 3  . insulin aspart (NOVOLOG) 100 UNIT/ML injection Inject 25 Units into the skin 3 (three) times daily before meals.     . Insulin Pen Needle 31G X 8 MM MISC by Does not apply route. Use two times a day as needed     . ketoconazole (NIZORAL) 2 % shampoo APPLY TOPICALLY TWICE WEEKLY AS DIRECTED. 120 mL 2  . lamoTRIgine (LAMICTAL) 200 MG tablet TAKE 1 TABLET BY MOUTH ONCE A DAY. 30 tablet 4  . lisinopril (PRINIVIL,ZESTRIL) 5 MG tablet TAKE 1 TABLET BY MOUTH ONCE A DAY. 30 tablet 4  . nystatin (MYCOSTATIN) powder APPLY TO AFFECTED AREA TWICE DAILY FOR 10 DAYS. 60 g 1  . nystatin-triamcinolone ointment (MYCOLOG) APPLY TO AFFECTED AREA TWICE DAILY. 60 g 2  . pantoprazole (PROTONIX) 40 MG tablet TAKE (1) TABLET BY MOUTH TWICE DAILY. 60 tablet 5  . potassium chloride SA (KLOR-CON M20) 20 MEQ tablet Take 1 tablet (20 mEq total) by mouth daily. 30 tablet 1  . simvastatin (ZOCOR) 40 MG tablet TAKE 1 TABLET BY MOUTH ONCE A DAY. 30 tablet 3  . terbinafine (LAMISIL) 250 MG tablet Take 1 tablet (250 mg total) by mouth daily. 30 tablet 2  . Testosterone (ANDROGEL PUMP TD) Place 75 g onto the skin every morning.    . traMADol (ULTRAM) 50 MG tablet TAKE 1 TABLET BY MOUTH ONCE DAILY AS NEEDED FOR KNEE PAIN. 30 tablet 2  . Vitamin D, Ergocalciferol, (DRISDOL) 50000 UNITS CAPS capsule Take 50,000 Units by mouth every 7 (seven) days.    . Canagliflozin (INVOKANA) 100 MG TABS Take 1 tablet by mouth daily.    Marland Kitchen exenatide (BYETTA 10 MCG PEN) 10 MCG/0.04ML SOLN Inject 10 mcg  into the skin 2 (two) times daily with a meal.     . insulin detemir (LEVEMIR) 100 UNIT/ML injection Inject 60 Units into the skin 2 (two) times daily.      No current facility-administered medications for this visit.    Allergies as of 01/15/2015  . (No Known Allergies)    Family History  Problem Relation Age of Onset  . Diabetes Mother   . Leukemia Mother   . Cancer Father     colon, age greater than 76?  Marland Kitchen Heart disease Father   . Glaucoma Father   . Parkinsonism Father     altzheimers , brain hemorrhage    History   Social History  . Marital Status: Legally Separated  Spouse Name: N/A  . Number of Children: 0  . Years of Education: 12   Occupational History  . DISABLED   .     Social History Main Topics  . Smoking status: Never Smoker   . Smokeless tobacco: Not on file     Comment: Quit x 13 years  . Alcohol Use: No  . Drug Use: No  . Sexual Activity: Not Currently   Other Topics Concern  . None   Social History Narrative    Review of Systems: General: Negative for anorexia, weight loss, fever, chills, fatigue, weakness. ENT: Negative for hoarseness, difficulty swallowing. CV: Negative for chest pain, angina, palpitations, peripheral edema.  Respiratory: Negative for dyspnea at rest, cough, sputum, wheezing. Baseline dyspnea related to weight and heat (per patient.) GI: See history of present illness. Endo: Negative for unusual weight change.  Heme: Negative for bruising or bleeding. Allergy: Negative for rash or hives.   Physical Exam: BP 130/67 mmHg  Pulse 85  Temp(Src) 98.4 F (36.9 C)  Ht 5\' 9"  (1.753 m)  Wt 454 lb 6.4 oz (206.114 kg)  BMI 67.07 kg/m2 General:   Morbidly obese. Alert and oriented. Pleasant and cooperative. Well-nourished and well-developed.  Head:  Normocephalic and atraumatic. Eyes:  Without icterus, sclera clear and conjunctiva pink.  Ears:  Normal auditory acuity. Cardiovascular:  S1, S2 present without murmurs  appreciated. Normal pulses noted. Extremities without clubbing or edema. Respiratory:  Clear to auscultation bilaterally. No wheezes, rales, or rhonchi. No distress.  Gastrointestinal:  +BS, morbidly obese, generally soft, and non-distended. Area of increased firmness and tenderness to palpation at the midabdominal region just superior to the umbilicus. Some additional protrusion with abdominal flexion. Difficult to fully appreciate and charicterize due to abdominal girth. No HSM noted. No guarding or rebound. No masses appreciated.  Rectal:  Deferred  Neurologic:  Alert and oriented x4;  grossly normal neurologically. Psych:  Alert and cooperative. Normal mood and affect. Heme/Lymph/Immune: No excessive bruising noted.    01/15/2015 10:44 AM

## 2015-01-15 NOTE — Assessment & Plan Note (Signed)
59 year old male with new and different abdominal pain to the anterior mid abdomen and an area just superior to the umbilicus. He states this feels different than his gastritis pain that he was having prior to his endoscopy. He is not toxic appearing. No red flag/warning signs or symptoms. The pain is worse when he presses on that area. He also notes an associated "not" in that area. Denies nausea and vomiting. He states his discomfort is constant and rates it at a 9 out of 10. At this point we will proceed with a CT of the abdomen and a bariatric CT scan of the can accommodate his abdominal girth and weight. We'll have him return in 3 months for further evaluation.

## 2015-01-15 NOTE — Progress Notes (Signed)
cc'ed to pcp °

## 2015-01-15 NOTE — Patient Instructions (Signed)
1. We will plan to do a CT scan of your abdomen to further evaluate her abdominal pain and abdominal wall bulge. 2. We will notify you when we have found facility that is able to complete this procedure. 3. Return for follow-up in 3 months.

## 2015-01-15 NOTE — Addendum Note (Signed)
Addended by: [place], Camrin Gearheart M on: 01/15/2015 12:02 PM   Modules accepted: Medications

## 2015-01-18 ENCOUNTER — Ambulatory Visit (HOSPITAL_COMMUNITY): Payer: PPO

## 2015-01-24 ENCOUNTER — Other Ambulatory Visit: Payer: Self-pay | Admitting: Family Medicine

## 2015-01-25 ENCOUNTER — Encounter (HOSPITAL_COMMUNITY): Payer: Self-pay

## 2015-01-25 ENCOUNTER — Ambulatory Visit (HOSPITAL_COMMUNITY)
Admission: RE | Admit: 2015-01-25 | Discharge: 2015-01-25 | Disposition: A | Discharge status: Home / Self Care | Payer: PPO | Source: Ambulatory Visit | Attending: Nurse Practitioner | Admitting: Nurse Practitioner

## 2015-01-25 DIAGNOSIS — R1900 Intra-abdominal and pelvic swelling, mass and lump, unspecified site: Secondary | ICD-10-CM

## 2015-01-25 DIAGNOSIS — K8020 Calculus of gallbladder without cholecystitis without obstruction: Secondary | ICD-10-CM | POA: Insufficient documentation

## 2015-01-25 DIAGNOSIS — I348 Other nonrheumatic mitral valve disorders: Secondary | ICD-10-CM | POA: Diagnosis not present

## 2015-01-25 DIAGNOSIS — M479 Spondylosis, unspecified: Secondary | ICD-10-CM | POA: Diagnosis not present

## 2015-01-25 DIAGNOSIS — I700 Atherosclerosis of aorta: Secondary | ICD-10-CM | POA: Insufficient documentation

## 2015-01-25 DIAGNOSIS — R109 Unspecified abdominal pain: Secondary | ICD-10-CM | POA: Insufficient documentation

## 2015-01-25 DIAGNOSIS — M5136 Other intervertebral disc degeneration, lumbar region: Secondary | ICD-10-CM | POA: Diagnosis not present

## 2015-01-25 DIAGNOSIS — R599 Enlarged lymph nodes, unspecified: Secondary | ICD-10-CM | POA: Diagnosis not present

## 2015-01-25 LAB — POCT I-STAT CREATININE: Creatinine, Ser: 1 mg/dL (ref 0.61–1.24)

## 2015-01-25 MED ORDER — IOHEXOL 300 MG/ML  SOLN
100.0000 mL | Freq: Once | INTRAMUSCULAR | Status: AC | PRN
  Administered 2015-01-25: 100 mL via INTRAVENOUS

## 2015-01-28 ENCOUNTER — Other Ambulatory Visit: Payer: Self-pay

## 2015-01-28 ENCOUNTER — Telehealth: Payer: Self-pay

## 2015-01-28 ENCOUNTER — Ambulatory Visit (HOSPITAL_COMMUNITY)
Admission: RE | Admit: 2015-01-28 | Discharge: 2015-01-28 | Disposition: A | Discharge status: Home / Self Care | Payer: PPO | Source: Ambulatory Visit | Attending: Gastroenterology | Admitting: Gastroenterology

## 2015-01-28 DIAGNOSIS — K760 Fatty (change of) liver, not elsewhere classified: Secondary | ICD-10-CM | POA: Diagnosis not present

## 2015-01-28 DIAGNOSIS — R1011 Right upper quadrant pain: Secondary | ICD-10-CM | POA: Diagnosis present

## 2015-01-28 DIAGNOSIS — K8020 Calculus of gallbladder without cholecystitis without obstruction: Secondary | ICD-10-CM | POA: Insufficient documentation

## 2015-01-28 DIAGNOSIS — K769 Liver disease, unspecified: Secondary | ICD-10-CM

## 2015-01-28 LAB — HEPATIC FUNCTION PANEL
ALBUMIN: 4 g/dL (ref 3.6–5.1)
ALK PHOS: 73 U/L (ref 40–115)
ALT: 24 U/L (ref 9–46)
AST: 24 U/L (ref 10–35)
BILIRUBIN DIRECT: 0.1 mg/dL (ref <=0.2)
BILIRUBIN INDIRECT: 0.4 mg/dL (ref 0.2–1.2)
Total Bilirubin: 0.5 mg/dL (ref 0.2–1.2)
Total Protein: 6.6 g/dL (ref 6.1–8.1)

## 2015-01-28 NOTE — Telephone Encounter (Signed)
Pt is aware to go have the blood work done and he is also going to have the [place] today.

## 2015-01-28 NOTE — Telephone Encounter (Signed)
There is no mention of a hernia on CT. I would need to review this with the radiologist further, but with acute pain he is having, needs to have stat LFTs and lipase. His CT shows gallstones along with 2 liver lesions. We need to do the following:  1. Stat LFTs and lipase 2. See if we can do an ultrasound RUQ today to assess for cholecystitis.  3. Needs open MRI for further evaluation of liver lesions in next week or so.

## 2015-01-28 NOTE — Telephone Encounter (Signed)
Gallstones noted on [place] but no outright findings of cholecystitis. He may ultimately need a HIDA scan WITH ENSURE not CCK. Awaiting labs. Please let him know we will have to call him tomorrow with the bloodwork. It is not ready yet.

## 2015-01-28 NOTE — Telephone Encounter (Signed)
Pt is calling because he is having in his R side and going into his back. His pain level is at a 10. He is hurting in his stomach also. He had a CT scan done Friday and is wanting to know what it shows. I advised him to go to the ER since he was hurting so bad but he did not want to go because he can not afford the bill. Please advise

## 2015-01-28 NOTE — Telephone Encounter (Signed)
LFTs are normal.  For some reason it doesn't look like lipase is getting processed? Was that done?  If patient is still having pain, I would suggest setting up for a HIDA with ensure.  May need to refer to Dr. Arnoldo Morale. Avoid fatty/fried foods.

## 2015-01-29 ENCOUNTER — Other Ambulatory Visit: Payer: Self-pay

## 2015-01-29 DIAGNOSIS — R109 Unspecified abdominal pain: Secondary | ICD-10-CM

## 2015-01-29 MED ORDER — HYDROCODONE-ACETAMINOPHEN 5-325 MG PO TABS: 1.0000 | ORAL_TABLET | Freq: Four times a day (QID) | ORAL | Status: DC | PRN

## 2015-01-29 NOTE — Telephone Encounter (Signed)
Pt is aware and will come by to pick up Rx

## 2015-01-29 NOTE — Telephone Encounter (Signed)
Pt is set up for HIDA scan on 02/04/15 @ 8:00 am. He is aware. After this test he said we ill get the MRI set up.

## 2015-01-29 NOTE — Telephone Encounter (Signed)
Pt is wanting to know if he can have some pain medication for the abd pain. He goes to [facility]. Please advise

## 2015-01-29 NOTE — Telephone Encounter (Signed)
Please see phone note. If persistent pain, proceed with HIDA with ensure. Still recommend the open MRI.

## 2015-01-29 NOTE — Telephone Encounter (Signed)
Yes. He will need to come by and pick up. Will give one prescription until we figure out what is going on. I have printed it.

## 2015-01-30 NOTE — Telephone Encounter (Signed)
Pt called and states that he wants to cancel the HIDA scan. Wants to proceed with MRI

## 2015-02-01 NOTE — Telephone Encounter (Signed)
Pt is set up of MRI on 02/05/15 and he is aware

## 2015-02-04 ENCOUNTER — Telehealth: Payer: Self-pay | Admitting: Internal Medicine

## 2015-02-04 ENCOUNTER — Encounter: Payer: Self-pay | Admitting: Internal Medicine

## 2015-02-04 ENCOUNTER — Ambulatory Visit (HOSPITAL_COMMUNITY): Payer: PPO

## 2015-02-04 NOTE — Telephone Encounter (Signed)
[phone]  PLEASE CAL PATIENT, STATES THE PAIN MEDS HE WAS GIVEN ARE NOT HELPING AND HE NEEDS SOMETHING STRONGER

## 2015-02-04 NOTE — Telephone Encounter (Signed)
I spoke with the pt- he is having sharp pains in his back, sides, stomach. Sometimes it is just one side but most of the time it is both sides. No N/V, no fever. BM's are normal, has a good bm every other day. No blood in his stool.  His MRI is tomorrow. He is currently taking norco every 6 hours. Pt said it is not working.   Routing to EG and AS.

## 2015-02-04 NOTE — Telephone Encounter (Signed)
Noted thanks °

## 2015-02-05 NOTE — Telephone Encounter (Signed)
Pt called office and wants Walden Field to call him at home.

## 2015-02-05 NOTE — Telephone Encounter (Signed)
Eric:   You last saw this patient. Do you mind weighing in on what you feel we should do next? I had ordered a HIDA, which he cancelled. MRI due to ?liver lesions. Unable to fit into MRI. I'm not sure what was going on when he last saw you.

## 2015-02-05 NOTE — Telephone Encounter (Signed)
Spoke with pt and he states that he is not interested in having the HIDA. He just wants his gallbladder out. He already knows that he has stones.  States he will deal with the pain.   Pt on recall for 33month CT

## 2015-02-05 NOTE — Telephone Encounter (Signed)
Please initiate a referral for surgical consult at a surgical practice that takes [facility]. Inform the patient we can do the referral but can't guarantee surgery will proceed with a cholecystectomy (with or without HIDA.) I am off tomorrow so cannot contact him at home. Ask him if there's any questions he has that the nurse can answer and I can try and contact him on Thursday as schedule permits.

## 2015-02-05 NOTE — Telephone Encounter (Signed)
Please notify the patient that based on his last CT results and inability to have MRI, we will need to repeat CT in 3 months. Please NIC him for this. Also, recommend proceeding with the HIDA scan WITH ENSURE (not CCK) to evaluate his gallbladder function. He previously cancelled this. We are unable to provide a stronger pain Rx out of the office if what Vicente Males gave him is ineffective and he is in severe pain he should be evaluated in the ER.

## 2015-02-05 NOTE — Telephone Encounter (Signed)
Summer from [facility] called- pt arrived for his MRI but he does not fit into the machine, so they were unable to complete the MRI.

## 2015-02-06 ENCOUNTER — Other Ambulatory Visit: Payer: Self-pay

## 2015-02-06 DIAGNOSIS — R1900 Intra-abdominal and pelvic swelling, mass and lump, unspecified site: Secondary | ICD-10-CM

## 2015-02-06 DIAGNOSIS — R109 Unspecified abdominal pain: Secondary | ICD-10-CM

## 2015-02-06 NOTE — Telephone Encounter (Signed)
Called pt to inform him of Appt with Dr. Arnoldo Morale.  LMOM  Appt 02/19/2015 @ 9:15am

## 2015-02-06 NOTE — Telephone Encounter (Signed)
Referral faxed to Dr. Arnoldo Morale office

## 2015-02-06 NOTE — Telephone Encounter (Signed)
Spoke with pt this morning. States that it is ok to proceed with surgical consultation. States he will do Nicholas [name] if Psychologist, sport and exercise needs it.  Still wants Nicholas Field NP to call him when he can.

## 2015-02-07 ENCOUNTER — Ambulatory Visit: Payer: PPO | Admitting: Podiatry

## 2015-02-08 NOTE — Telephone Encounter (Signed)
Patient call was returned. He wanted to inform me that he has an appointment with Dr. Arnoldo Morale (surgery) on Tuesday and emphasize he really wants his gallbladder taken out.

## 2015-02-11 ENCOUNTER — Ambulatory Visit: Payer: PPO

## 2015-02-13 ENCOUNTER — Other Ambulatory Visit: Payer: Self-pay | Admitting: Family Medicine

## 2015-02-14 LAB — LIPID PANEL
Cholesterol: 168 mg/dL (ref 125–200)
HDL: 41 mg/dL (ref >=40)
LDL CALC: 80 mg/dL (ref <130)
Total CHOL/HDL Ratio: 4.1 Ratio (ref <=5.0)
Triglycerides: 236 mg/dL — ABNORMAL HIGH (ref <150)
VLDL: 47 mg/dL — ABNORMAL HIGH (ref <30)

## 2015-02-14 LAB — COMPLETE METABOLIC PANEL WITH GFR
ALT: 28 U/L (ref 9–46)
AST: 26 U/L (ref 10–35)
Albumin: 4 g/dL (ref 3.6–5.1)
Alkaline Phosphatase: 75 U/L (ref 40–115)
BUN: 19 mg/dL (ref 7–25)
CHLORIDE: 98 mmol/L (ref 98–110)
CO2: 29 mmol/L (ref 20–31)
CREATININE: 1.07 mg/dL (ref 0.70–1.25)
Calcium: 9.8 mg/dL (ref 8.6–10.3)
GFR, EST AFRICAN AMERICAN: 87 mL/min (ref >=60)
GFR, EST NON AFRICAN AMERICAN: 75 mL/min (ref >=60)
Glucose, Bld: 233 mg/dL — ABNORMAL HIGH (ref 65–99)
POTASSIUM: 5.1 mmol/L (ref 3.5–5.3)
Sodium: 137 mmol/L (ref 135–146)
Total Bilirubin: 0.5 mg/dL (ref 0.2–1.2)
Total Protein: 6.3 g/dL (ref 6.1–8.1)

## 2015-02-14 LAB — HEMOGLOBIN A1C
HEMOGLOBIN A1C: 9.6 % — AB (ref <5.7)
Mean Plasma Glucose: 229 mg/dL — ABNORMAL HIGH (ref <117)

## 2015-02-14 LAB — TSH: TSH: 4.227 u[IU]/mL (ref 0.350–4.500)

## 2015-02-14 LAB — VITAMIN D 25 HYDROXY (VIT D DEFICIENCY, FRACTURES): Vit D, 25-Hydroxy: 27 ng/mL — ABNORMAL LOW (ref 30–100)

## 2015-02-20 ENCOUNTER — Ambulatory Visit: Payer: PPO | Admitting: Family Medicine

## 2015-02-20 NOTE — H&P (Signed)
  NTS SOAP Note  Vital Signs:  Vitals as of: 6/71/2458: Systolic 099: Diastolic 79: Heart Rate 85: Temp 98.57F: Height 41ft 9in: Weight 450Lbs 0 Ounces: Pain Level 9: BMI 66.45  BMI : 66.45 kg/m2  Subjective: This 59 year old male presents for of right upper quadrant abdominal pain.  Has been going on for some time, but is getting worse.  Pain radiates to the right flank.  + nausea.  Does have fatty food intolerance.  No fever, chills, jaundice.  Review of Symptoms:  Constitutional:fatigue Head:unremarkable Eyes:unremarkable   Nose/Mouth/Throat:unremarkable Cardiovascular:  unremarkable Respiratory:dyspnea Gastrointestinabdominal pain, nausea, heartburn, dyspepsia Genitourinary:unremarkable   joint and back pain Skin:unremarkable Hematolgic/Lymphatic:unremarkable   Allergic/Immunologic:unremarkable   Past Medical History:  Reviewed  Past Medical History  Surgical History: unremarkable Medical Problems: morbid obesity, IDDM, HTN, high cholesterol Allergies: nkda Medications: baby asa, citalopram, insulin victoza, cyclobenzaprine, lasix, gabapentin, lamictal, lisinopril, pantoprazole, KDur, zocor, tramadol   Social History:Reviewed  Social History  Preferred Language: English Race:  White Ethnicity: Not Hispanic / Latino Age: 85 year Marital Status:  S Alcohol: no   Smoking Status: Never smoker reviewed on 02/19/2015 Functional Status reviewed on 02/19/2015 ------------------------------------------------ Bathing: Normal Cooking: Normal Dressing: Normal Driving: Normal Eating: Normal Managing Meds: Normal Oral Care: Normal Shopping: Normal Toileting: Normal Transferring: Normal Walking: Normal Cognitive Status reviewed on 02/19/2015 ------------------------------------------------ Attention: Normal Decision Making: Normal Language: Normal Memory: Normal Motor: Normal Perception: Normal Problem Solving: Normal Visual and Spatial:  Normal   Family History:Reviewed  Family Health History Mother, Deceased; Leukemia;  Father, Deceased; Healthy;     Objective Information: General:Well appearing, well nourished in no distress. Heart:RRR, no murmur or gallop.  Normal S1, S2.  No S3, S4.  Lungs:  CTA bilaterally, no wheezes, rhonchi, rales.  Breathing unlabored. Abdomen:Soft, tender in right upper quadrant to palpation, ND, normal bowel sounds, no HSM, no masses.  No peritoneal signs. U/S shows cholelithiasis, normal common bile duct Assessment:  Biliary colic, cholelithiasis  Diagnoses: 574.20  K80.20 Gallstone (Calculus of gallbladder without cholecystitis without obstruction)  Procedures: 83382 - OFFICE OUTPATIENT NEW 30 MINUTES    Plan: Scheduled for laparoscopic cholecystectomy on 02/25/15.   Patient Education:Alternative treatments to surgery were discussed with patient (and family).  Risks and benefits  of procedure including increased risks of bleeding, infection, hepatobiliary injury, cardiopulmonary difficulties due to his morbid obesity were fully explained to the patient (and family) who gave informed consent. Patient/family questions were addressed.  Follow-up:Pending Surgery

## 2015-02-20 NOTE — Patient Instructions (Signed)
Nicholas [name]  02/20/2015     @PREFPERIOPPHARMACY @   Your procedure is scheduled on  02/25/2015  Report to [facility] at  615  A.M.  Call this number if you have problems the morning of surgery:  [phone]   Remember:  Do not eat food or drink liquids after midnight.  Take these medicines the morning of surgery with A SIP OF WATER celexa, flexaril, neurontin, hydrocodone, lisinopril. Take 1/2 of your usual Insulin dosage the night before your  Surgery.   Do not wear jewelry, make-up or nail polish.  Do not wear lotions, powders, or perfumes.   Do not shave 48 hours prior to surgery.  Men may shave face and neck.  Do not bring valuables to the hospital.  [facility] is not responsible for any belongings or valuables.  Contacts, dentures or bridgework may not be worn into surgery.  Leave your suitcase in the car.  After surgery it may be brought to your room.  For patients admitted to the hospital, discharge time will be determined by your treatment team.  Patients discharged the day of surgery will not be allowed to drive home.   Name and phone number of your driver:   family Special instructions:  none  Please read over the following fact sheets that you were given. Pain Booklet, Coughing and Deep Breathing, Surgical Site Infection Prevention, Anesthesia Post-op Instructions and Care and Recovery After Surgery      Laparoscopic Cholecystectomy Laparoscopic cholecystectomy is surgery to remove the gallbladder. The gallbladder is located in the upper right part of the abdomen, behind the liver. It is a storage sac for bile produced in the liver. Bile aids in the digestion and absorption of fats. Cholecystectomy is often done for inflammation of the gallbladder (cholecystitis). This condition is usually caused by a buildup of gallstones (cholelithiasis) in your gallbladder. Gallstones can block the flow of bile, resulting in inflammation and pain. In severe cases,  emergency surgery may be required. When emergency surgery is not required, you will have time to prepare for the procedure. Laparoscopic surgery is an alternative to open surgery. Laparoscopic surgery has a shorter recovery time. Your common bile duct may also need to be examined during the procedure. If stones are found in the common bile duct, they may be removed. LET [facility] CARE PROVIDER KNOW ABOUT:  Any allergies you have.  All medicines you are taking, including vitamins, herbs, eye drops, creams, and over-the-counter medicines.  Previous problems you or members of your family have had with the use of anesthetics.  Any blood disorders you have.  Previous surgeries you have had.  Medical conditions you have. RISKS AND COMPLICATIONS Generally, this is a safe procedure. However, as with any procedure, complications can occur. Possible complications include:  Infection.  Damage to the common bile duct, nerves, arteries, veins, or other internal organs such as the stomach, liver, or intestines.  Bleeding.  A stone may remain in the common bile duct.  A bile leak from the cyst duct that is clipped when your gallbladder is removed.  The need to convert to open surgery, which requires a larger incision in the abdomen. This may be necessary if your surgeon thinks it is not safe to continue with a laparoscopic procedure. BEFORE THE PROCEDURE  Ask your health care provider about changing or stopping any regular medicines. You will need to stop taking aspirin or blood thinners at least 5 days prior  to surgery.  Do not eat or drink anything after midnight the night before surgery.  Let your health care provider know if you develop a cold or other infectious problem before surgery. PROCEDURE   You will be given medicine to make you sleep through the procedure (general anesthetic). A breathing tube will be placed in your mouth.  When you are asleep, your surgeon will make several  small cuts (incisions) in your abdomen.  A thin, lighted tube with a tiny camera on the end (laparoscope) is inserted through one of the small incisions. The camera on the laparoscope sends a picture to a TV screen in the operating room. This gives the surgeon a good view inside your abdomen.  A gas will be pumped into your abdomen. This expands your abdomen so that the surgeon has more room to perform the surgery.  Other tools needed for the procedure are inserted through the other incisions. The gallbladder is removed through one of the incisions.  After the removal of your gallbladder, the incisions will be closed with stitches, staples, or skin glue. AFTER THE PROCEDURE  You will be taken to a recovery area where your progress will be checked often.  You may be allowed to go home the same day if your pain is controlled and you can tolerate liquids. Document Released: 06/15/2005 Document Revised: 04/05/2013 Document Reviewed: 01/25/2013 [facility] Patient Information 2015 [place], [place]. This information is not intended to replace advice given to you by your health care provider. Make sure you discuss any questions you have with your health care provider. PATIENT INSTRUCTIONS POST-ANESTHESIA  IMMEDIATELY FOLLOWING SURGERY:  Do not drive or operate machinery for the first twenty four hours after surgery.  Do not make any important decisions for twenty four hours after surgery or while taking narcotic pain medications or sedatives.  If you develop intractable nausea and vomiting or a severe headache please notify your doctor immediately.  FOLLOW-UP:  Please make an appointment with your surgeon as instructed. You do not need to follow up with anesthesia unless specifically instructed to do so.  WOUND CARE INSTRUCTIONS (if applicable):  Keep a dry clean dressing on the anesthesia/puncture wound site if there is drainage.  Once the wound has quit draining you may leave it open to air.  Generally  you should leave the bandage intact for twenty four hours unless there is drainage.  If the epidural site drains for more than 36-48 hours please call the anesthesia department.  QUESTIONS?:  Please feel free to call your physician or the hospital operator if you have any questions, and they will be happy to assist you.

## 2015-02-21 ENCOUNTER — Encounter (HOSPITAL_COMMUNITY)
Admission: RE | Admit: 2015-02-21 | Discharge: 2015-02-21 | Disposition: A | Discharge status: Home / Self Care | Payer: PPO | Source: Ambulatory Visit | Attending: General Surgery | Admitting: General Surgery

## 2015-02-21 ENCOUNTER — Encounter (HOSPITAL_COMMUNITY): Payer: Self-pay

## 2015-02-21 ENCOUNTER — Other Ambulatory Visit: Payer: Self-pay

## 2015-02-21 DIAGNOSIS — Z01818 Encounter for other preprocedural examination: Secondary | ICD-10-CM | POA: Insufficient documentation

## 2015-02-21 DIAGNOSIS — K8020 Calculus of gallbladder without cholecystitis without obstruction: Secondary | ICD-10-CM | POA: Insufficient documentation

## 2015-02-21 LAB — BASIC METABOLIC PANEL
Anion gap: 6 (ref 5–15)
BUN: 18 mg/dL (ref 6–20)
CALCIUM: 8.9 mg/dL (ref 8.9–10.3)
CO2: 26 mmol/L (ref 22–32)
Chloride: 105 mmol/L (ref 101–111)
Creatinine, Ser: 0.96 mg/dL (ref 0.61–1.24)
GFR calc Af Amer: >60 mL/min (ref >60)
GFR calc non Af Amer: >60 mL/min (ref >60)
GLUCOSE: 247 mg/dL — AB (ref 65–99)
Potassium: 4.8 mmol/L (ref 3.5–5.1)
Sodium: 137 mmol/L (ref 135–145)

## 2015-02-21 LAB — CBC WITH DIFFERENTIAL/PLATELET
BASOS PCT: 1 % (ref 0–1)
Basophils Absolute: 0.1 10*3/uL (ref 0.0–0.1)
Eosinophils Absolute: 0.3 10*3/uL (ref 0.0–0.7)
Eosinophils Relative: 3 % (ref 0–5)
HEMATOCRIT: 46.9 % (ref 39.0–52.0)
Hemoglobin: 15.6 g/dL (ref 13.0–17.0)
Lymphocytes Relative: 17 % (ref 12–46)
Lymphs Abs: 2.1 10*3/uL (ref 0.7–4.0)
MCH: 30.1 pg (ref 26.0–34.0)
MCHC: 33.3 g/dL (ref 30.0–36.0)
MCV: 90.5 fL (ref 78.0–100.0)
MONO ABS: 1.2 10*3/uL — AB (ref 0.1–1.0)
Monocytes Relative: 10 % (ref 3–12)
NEUTROS ABS: 8.2 10*3/uL — AB (ref 1.7–7.7)
Neutrophils Relative %: 69 % (ref 43–77)
Platelets: 218 10*3/uL (ref 150–400)
RBC: 5.18 MIL/uL (ref 4.22–5.81)
RDW: 15.5 % (ref 11.5–15.5)
WBC: 11.8 10*3/uL — ABNORMAL HIGH (ref 4.0–10.5)

## 2015-02-21 LAB — HEPATIC FUNCTION PANEL
ALBUMIN: 3.6 g/dL (ref 3.5–5.0)
ALT: 32 U/L (ref 17–63)
AST: 36 U/L (ref 15–41)
Alkaline Phosphatase: 69 U/L (ref 38–126)
BILIRUBIN DIRECT: 0.1 mg/dL (ref 0.1–0.5)
Indirect Bilirubin: 0.5 mg/dL (ref 0.3–0.9)
Total Bilirubin: 0.6 mg/dL (ref 0.3–1.2)
Total Protein: 6.2 g/dL — ABNORMAL LOW (ref 6.5–8.1)

## 2015-02-21 LAB — SURGICAL PCR SCREEN
MRSA, PCR: NEGATIVE
STAPHYLOCOCCUS AUREUS: NEGATIVE

## 2015-02-21 NOTE — Progress Notes (Signed)
   02/21/15 1459  OBSTRUCTIVE SLEEP APNEA  Have you ever been diagnosed with sleep apnea through a sleep study? No  Do you snore loudly (loud enough to be heard through closed doors)?  0  Do you often feel tired, fatigued, or sleepy during the daytime? 1  Has anyone observed you stop breathing during your sleep? 0  Do you have, or are you being treated for high blood pressure? 1  BMI more than 35 kg/m2? 1  Age over 60 years old? 1  Neck circumference greater than 40 cm/16 inches? 1  Gender: 1

## 2015-02-25 ENCOUNTER — Encounter (HOSPITAL_COMMUNITY)
Admission: RE | Disposition: A | Discharge status: Home / Self Care | Payer: Self-pay | Source: Ambulatory Visit | Attending: General Surgery

## 2015-02-25 ENCOUNTER — Encounter (HOSPITAL_COMMUNITY): Payer: Self-pay | Admitting: *Deleted

## 2015-02-25 ENCOUNTER — Ambulatory Visit (HOSPITAL_COMMUNITY): Payer: PPO | Admitting: Anesthesiology

## 2015-02-25 ENCOUNTER — Observation Stay (HOSPITAL_COMMUNITY)
Admission: RE | Admit: 2015-02-25 | Discharge: 2015-02-26 | Disposition: A | Discharge status: Home / Self Care | Payer: PPO | Source: Ambulatory Visit | Attending: General Surgery | Admitting: General Surgery

## 2015-02-25 DIAGNOSIS — Z79899 Other long term (current) drug therapy: Secondary | ICD-10-CM | POA: Insufficient documentation

## 2015-02-25 DIAGNOSIS — K8010 Calculus of gallbladder with chronic cholecystitis without obstruction: Principal | ICD-10-CM | POA: Insufficient documentation

## 2015-02-25 DIAGNOSIS — Z806 Family history of leukemia: Secondary | ICD-10-CM | POA: Diagnosis not present

## 2015-02-25 DIAGNOSIS — E119 Type 2 diabetes mellitus without complications: Secondary | ICD-10-CM | POA: Diagnosis not present

## 2015-02-25 DIAGNOSIS — Z794 Long term (current) use of insulin: Secondary | ICD-10-CM | POA: Insufficient documentation

## 2015-02-25 DIAGNOSIS — K760 Fatty (change of) liver, not elsewhere classified: Secondary | ICD-10-CM | POA: Diagnosis not present

## 2015-02-25 DIAGNOSIS — Z6841 Body Mass Index (BMI) 40.0 and over, adult: Secondary | ICD-10-CM | POA: Insufficient documentation

## 2015-02-25 DIAGNOSIS — R0689 Other abnormalities of breathing: Secondary | ICD-10-CM | POA: Diagnosis present

## 2015-02-25 DIAGNOSIS — Z7982 Long term (current) use of aspirin: Secondary | ICD-10-CM | POA: Insufficient documentation

## 2015-02-25 DIAGNOSIS — G4730 Sleep apnea, unspecified: Secondary | ICD-10-CM | POA: Insufficient documentation

## 2015-02-25 DIAGNOSIS — K7469 Other cirrhosis of liver: Secondary | ICD-10-CM | POA: Insufficient documentation

## 2015-02-25 HISTORY — PX: CHOLECYSTECTOMY: SHX55

## 2015-02-25 LAB — GLUCOSE, CAPILLARY
GLUCOSE-CAPILLARY: 164 mg/dL — AB (ref 65–99)
GLUCOSE-CAPILLARY: 169 mg/dL — AB (ref 65–99)
GLUCOSE-CAPILLARY: 152 mg/dL — AB (ref 65–99)
GLUCOSE-CAPILLARY: 179 mg/dL — AB (ref 65–99)
Glucose-Capillary: 125 mg/dL — ABNORMAL HIGH (ref 65–99)

## 2015-02-25 SURGERY — LAPAROSCOPIC CHOLECYSTECTOMY
Anesthesia: General

## 2015-02-25 MED ORDER — GLYCOPYRROLATE 0.2 MG/ML IJ SOLN
INTRAMUSCULAR | Status: DC | PRN
  Administered 2015-02-25: .8 mg via INTRAVENOUS

## 2015-02-25 MED ORDER — PROPOFOL 10 MG/ML IV BOLUS
INTRAVENOUS | Status: DC | PRN
  Administered 2015-02-25 (×2): 20 mg via INTRAVENOUS
  Administered 2015-02-25: 50 mg via INTRAVENOUS
  Administered 2015-02-25: 150 mg via INTRAVENOUS

## 2015-02-25 MED ORDER — ALBUTEROL SULFATE (2.5 MG/3ML) 0.083% IN NEBU: 2.5000 mg | INHALATION_SOLUTION | RESPIRATORY_TRACT | Status: DC | PRN

## 2015-02-25 MED ORDER — POVIDONE-IODINE 10 % OINT PACKET
TOPICAL_OINTMENT | CUTANEOUS | Status: DC | PRN
  Administered 2015-02-25: 1 via TOPICAL

## 2015-02-25 MED ORDER — GLYCOPYRROLATE 0.2 MG/ML IJ SOLN
INTRAMUSCULAR | Status: AC
  Filled 2015-02-25: qty 4

## 2015-02-25 MED ORDER — LACTATED RINGERS IV SOLN
INTRAVENOUS | Status: DC
  Administered 2015-02-25 (×2): via INTRAVENOUS

## 2015-02-25 MED ORDER — SODIUM CHLORIDE 0.9 % IJ SOLN
INTRAMUSCULAR | Status: AC
  Filled 2015-02-25: qty 10

## 2015-02-25 MED ORDER — SUCCINYLCHOLINE CHLORIDE 20 MG/ML IJ SOLN
INTRAMUSCULAR | Status: DC | PRN
  Administered 2015-02-25: 140 mg via INTRAVENOUS

## 2015-02-25 MED ORDER — LIDOCAINE HCL (CARDIAC) 10 MG/ML IV SOLN
INTRAVENOUS | Status: DC | PRN
  Administered 2015-02-25: 50 mg via INTRAVENOUS

## 2015-02-25 MED ORDER — EPHEDRINE SULFATE 50 MG/ML IJ SOLN
INTRAMUSCULAR | Status: AC
  Filled 2015-02-25: qty 1

## 2015-02-25 MED ORDER — HYDROCODONE-ACETAMINOPHEN 5-325 MG PO TABS: 1.0000 | ORAL_TABLET | Freq: Four times a day (QID) | ORAL | Status: DC | PRN

## 2015-02-25 MED ORDER — ACETAMINOPHEN 650 MG RE SUPP: 650.0000 mg | Freq: Four times a day (QID) | RECTAL | Status: DC | PRN

## 2015-02-25 MED ORDER — BRIMONIDINE TARTRATE-TIMOLOL 0.2-0.5 % OP SOLN: 1.0000 [drp] | Freq: Two times a day (BID) | OPHTHALMIC | Status: DC

## 2015-02-25 MED ORDER — ONDANSETRON 4 MG PO TBDP
4.0000 mg | ORAL_TABLET | Freq: Four times a day (QID) | ORAL | Status: DC | PRN
  Filled 2015-02-25: qty 1

## 2015-02-25 MED ORDER — PHENYLEPHRINE 40 MCG/ML (10ML) SYRINGE FOR IV PUSH (FOR BLOOD PRESSURE SUPPORT)
PREFILLED_SYRINGE | INTRAVENOUS | Status: AC
  Filled 2015-02-25: qty 10

## 2015-02-25 MED ORDER — SUCCINYLCHOLINE CHLORIDE 20 MG/ML IJ SOLN
INTRAMUSCULAR | Status: AC
  Filled 2015-02-25: qty 1

## 2015-02-25 MED ORDER — BUPIVACAINE HCL (PF) 0.5 % IJ SOLN
INTRAMUSCULAR | Status: DC | PRN
  Administered 2015-02-25: 10 mL

## 2015-02-25 MED ORDER — MIDAZOLAM HCL 2 MG/2ML IJ SOLN
INTRAMUSCULAR | Status: AC
  Filled 2015-02-25: qty 2

## 2015-02-25 MED ORDER — LACTATED RINGERS IV SOLN
INTRAVENOUS | Status: DC
  Administered 2015-02-25 (×2): via INTRAVENOUS

## 2015-02-25 MED ORDER — NEOSTIGMINE METHYLSULFATE 10 MG/10ML IV SOLN
INTRAVENOUS | Status: DC | PRN
  Administered 2015-02-25: 4 mg via INTRAVENOUS

## 2015-02-25 MED ORDER — POVIDONE-IODINE 10 % EX OINT
TOPICAL_OINTMENT | CUTANEOUS | Status: AC
  Filled 2015-02-25: qty 1

## 2015-02-25 MED ORDER — ONDANSETRON HCL 4 MG/2ML IJ SOLN
4.0000 mg | Freq: Once | INTRAMUSCULAR | Status: AC | PRN
Start: 2015-02-25 — End: 2015-02-25
  Administered 2015-02-25: 4 mg via INTRAVENOUS
  Filled 2015-02-25: qty 2

## 2015-02-25 MED ORDER — MORPHINE SULFATE (PF) 4 MG/ML IV SOLN
2.0000 mg | INTRAVENOUS | Status: DC | PRN
  Administered 2015-02-25: 2 mg via INTRAVENOUS
  Filled 2015-02-25: qty 1

## 2015-02-25 MED ORDER — TRAMADOL HCL 50 MG PO TABS: 50.0000 mg | ORAL_TABLET | Freq: Four times a day (QID) | ORAL | Status: DC | PRN

## 2015-02-25 MED ORDER — LISINOPRIL 5 MG PO TABS
5.0000 mg | ORAL_TABLET | Freq: Every day | ORAL | Status: DC
  Administered 2015-02-25 – 2015-02-26 (×2): 5 mg via ORAL
  Filled 2015-02-25 (×2): qty 1

## 2015-02-25 MED ORDER — NEOSTIGMINE METHYLSULFATE 10 MG/10ML IV SOLN
INTRAVENOUS | Status: AC
  Filled 2015-02-25: qty 1

## 2015-02-25 MED ORDER — TIMOLOL MALEATE 0.5 % OP SOLN
1.0000 [drp] | Freq: Two times a day (BID) | OPHTHALMIC | Status: DC
  Administered 2015-02-25 – 2015-02-26 (×2): 1 [drp] via OPHTHALMIC
  Filled 2015-02-25: qty 5

## 2015-02-25 MED ORDER — BRIMONIDINE TARTRATE 0.2 % OP SOLN
1.0000 [drp] | Freq: Two times a day (BID) | OPHTHALMIC | Status: DC
  Administered 2015-02-25 – 2015-02-26 (×2): 1 [drp] via OPHTHALMIC
  Filled 2015-02-25: qty 5

## 2015-02-25 MED ORDER — CITALOPRAM HYDROBROMIDE 20 MG PO TABS
40.0000 mg | ORAL_TABLET | Freq: Every day | ORAL | Status: DC
  Administered 2015-02-25 – 2015-02-26 (×2): 40 mg via ORAL
  Filled 2015-02-25 (×2): qty 2

## 2015-02-25 MED ORDER — ENOXAPARIN SODIUM 40 MG/0.4ML ~~LOC~~ SOLN
40.0000 mg | SUBCUTANEOUS | Status: DC
  Administered 2015-02-26: 40 mg via SUBCUTANEOUS
  Filled 2015-02-25: qty 0.4

## 2015-02-25 MED ORDER — ONDANSETRON HCL 4 MG/2ML IJ SOLN
INTRAMUSCULAR | Status: AC
  Filled 2015-02-25: qty 2

## 2015-02-25 MED ORDER — SIMVASTATIN 20 MG PO TABS
40.0000 mg | ORAL_TABLET | Freq: Every day | ORAL | Status: DC
  Administered 2015-02-25 – 2015-02-26 (×2): 40 mg via ORAL
  Filled 2015-02-25 (×2): qty 2

## 2015-02-25 MED ORDER — GLYCOPYRROLATE 0.2 MG/ML IJ SOLN
INTRAMUSCULAR | Status: AC
  Filled 2015-02-25: qty 1

## 2015-02-25 MED ORDER — MORPHINE SULFATE (PF) 2 MG/ML IV SOLN
2.0000 mg | INTRAVENOUS | Status: DC | PRN
  Administered 2015-02-25 – 2015-02-26 (×2): 2 mg via INTRAVENOUS
  Filled 2015-02-25 (×2): qty 1

## 2015-02-25 MED ORDER — ONDANSETRON HCL 4 MG/2ML IJ SOLN: 4.0000 mg | Freq: Four times a day (QID) | INTRAMUSCULAR | Status: DC | PRN

## 2015-02-25 MED ORDER — PANTOPRAZOLE SODIUM 40 MG PO TBEC
40.0000 mg | DELAYED_RELEASE_TABLET | Freq: Every day | ORAL | Status: DC
  Administered 2015-02-25: 40 mg via ORAL
  Filled 2015-02-25: qty 1

## 2015-02-25 MED ORDER — MIDAZOLAM HCL 2 MG/2ML IJ SOLN
1.0000 mg | INTRAMUSCULAR | Status: DC | PRN
Start: 2015-02-25 — End: 2015-02-25
  Administered 2015-02-25: 2 mg via INTRAVENOUS

## 2015-02-25 MED ORDER — CIPROFLOXACIN IN D5W 400 MG/200ML IV SOLN
400.0000 mg | INTRAVENOUS | Status: AC
  Administered 2015-02-25: 400 mg via INTRAVENOUS
  Filled 2015-02-25: qty 200

## 2015-02-25 MED ORDER — LIDOCAINE HCL (PF) 1 % IJ SOLN
INTRAMUSCULAR | Status: AC
  Filled 2015-02-25: qty 5

## 2015-02-25 MED ORDER — POTASSIUM CHLORIDE CRYS ER 20 MEQ PO TBCR
20.0000 meq | EXTENDED_RELEASE_TABLET | Freq: Every day | ORAL | Status: DC
  Administered 2015-02-25 – 2015-02-26 (×2): 20 meq via ORAL
  Filled 2015-02-25 (×2): qty 1

## 2015-02-25 MED ORDER — LAMOTRIGINE 100 MG PO TABS
200.0000 mg | ORAL_TABLET | Freq: Every day | ORAL | Status: DC
  Administered 2015-02-26: 200 mg via ORAL
  Filled 2015-02-25 (×3): qty 2

## 2015-02-25 MED ORDER — ROCURONIUM BROMIDE 100 MG/10ML IV SOLN
INTRAVENOUS | Status: DC | PRN
  Administered 2015-02-25 (×3): 5 mg via INTRAVENOUS
  Administered 2015-02-25: 25 mg via INTRAVENOUS

## 2015-02-25 MED ORDER — INSULIN ASPART 100 UNIT/ML ~~LOC~~ SOLN: 0.0000 [IU] | Freq: Every day | SUBCUTANEOUS | Status: DC

## 2015-02-25 MED ORDER — HEMOSTATIC AGENTS (NO CHARGE) OPTIME
TOPICAL | Status: DC | PRN
  Administered 2015-02-25: 1 via TOPICAL

## 2015-02-25 MED ORDER — FUROSEMIDE 20 MG PO TABS
20.0000 mg | ORAL_TABLET | Freq: Every day | ORAL | Status: DC
  Administered 2015-02-25 – 2015-02-26 (×2): 20 mg via ORAL
  Filled 2015-02-25 (×2): qty 1

## 2015-02-25 MED ORDER — SODIUM CHLORIDE 0.9 % IR SOLN
Status: DC | PRN
  Administered 2015-02-25: 1000 mL

## 2015-02-25 MED ORDER — PROPOFOL 10 MG/ML IV BOLUS
INTRAVENOUS | Status: AC
  Filled 2015-02-25: qty 20

## 2015-02-25 MED ORDER — FENTANYL CITRATE (PF) 100 MCG/2ML IJ SOLN: 25.0000 ug | INTRAMUSCULAR | Status: DC | PRN

## 2015-02-25 MED ORDER — ROCURONIUM BROMIDE 50 MG/5ML IV SOLN
INTRAVENOUS | Status: AC
  Filled 2015-02-25: qty 1

## 2015-02-25 MED ORDER — INSULIN ASPART 100 UNIT/ML ~~LOC~~ SOLN
0.0000 [IU] | Freq: Three times a day (TID) | SUBCUTANEOUS | Status: DC
  Administered 2015-02-25 (×2): 4 [IU] via SUBCUTANEOUS
  Administered 2015-02-26: 7 [IU] via SUBCUTANEOUS

## 2015-02-25 MED ORDER — BUPIVACAINE HCL (PF) 0.5 % IJ SOLN
INTRAMUSCULAR | Status: AC
  Filled 2015-02-25: qty 30

## 2015-02-25 MED ORDER — HYDROCODONE-ACETAMINOPHEN 5-325 MG PO TABS
1.0000 | ORAL_TABLET | Freq: Four times a day (QID) | ORAL | Status: DC | PRN
  Administered 2015-02-25 (×2): 1 via ORAL
  Filled 2015-02-25 (×2): qty 1

## 2015-02-25 MED ORDER — FENTANYL CITRATE (PF) 100 MCG/2ML IJ SOLN
INTRAMUSCULAR | Status: DC | PRN
  Administered 2015-02-25: 100 ug via INTRAVENOUS
  Administered 2015-02-25 (×2): 25 ug via INTRAVENOUS
  Administered 2015-02-25: 50 ug via INTRAVENOUS

## 2015-02-25 MED ORDER — CHLORHEXIDINE GLUCONATE 4 % EX LIQD: 1.0000 "application " | Freq: Once | CUTANEOUS | Status: DC

## 2015-02-25 MED ORDER — KETOROLAC TROMETHAMINE 30 MG/ML IJ SOLN
30.0000 mg | Freq: Once | INTRAMUSCULAR | Status: AC
  Administered 2015-02-25: 30 mg via INTRAVENOUS
  Filled 2015-02-25: qty 1

## 2015-02-25 MED ORDER — FENTANYL CITRATE (PF) 250 MCG/5ML IJ SOLN
INTRAMUSCULAR | Status: AC
  Filled 2015-02-25: qty 25

## 2015-02-25 MED ORDER — GABAPENTIN 300 MG PO CAPS
300.0000 mg | ORAL_CAPSULE | Freq: Every day | ORAL | Status: DC
  Administered 2015-02-25 – 2015-02-26 (×2): 300 mg via ORAL
  Filled 2015-02-25 (×2): qty 1

## 2015-02-25 MED ORDER — PHENYLEPHRINE HCL 10 MG/ML IJ SOLN
INTRAMUSCULAR | Status: DC | PRN
  Administered 2015-02-25 (×3): 40 ug via INTRAVENOUS

## 2015-02-25 MED ORDER — ONDANSETRON HCL 4 MG/2ML IJ SOLN
4.0000 mg | Freq: Once | INTRAMUSCULAR | Status: AC
  Administered 2015-02-25: 4 mg via INTRAVENOUS

## 2015-02-25 MED ORDER — GLYCOPYRROLATE 0.2 MG/ML IJ SOLN
0.2000 mg | Freq: Once | INTRAMUSCULAR | Status: AC
  Administered 2015-02-25: 0.2 mg via INTRAVENOUS

## 2015-02-25 MED ORDER — ACETAMINOPHEN 325 MG PO TABS: 650.0000 mg | ORAL_TABLET | Freq: Four times a day (QID) | ORAL | Status: DC | PRN

## 2015-02-25 SURGICAL SUPPLY — 51 items
APPLIER CLIP LAPSCP 10X32 DD (CLIP) ×2 IMPLANT
BAG HAMPER (MISCELLANEOUS) ×2 IMPLANT
BAG SPEC RTRVL LRG 6X4 10 (ENDOMECHANICALS) ×1
CHLORAPREP W/TINT 26ML (MISCELLANEOUS) ×2 IMPLANT
CLOTH BEACON ORANGE TIMEOUT ST (SAFETY) ×2 IMPLANT
COVER LIGHT HANDLE STERIS (MISCELLANEOUS) ×4 IMPLANT
DECANTER SPIKE VIAL GLASS SM (MISCELLANEOUS) ×2 IMPLANT
DRSG GAUZE PETRO 3X36 STRIP (GAUZE/BANDAGES/DRESSINGS) ×1 IMPLANT
ELECT REM PT RETURN 9FT ADLT (ELECTROSURGICAL) ×2
ELECTRODE REM PT RTRN 9FT ADLT (ELECTROSURGICAL) ×1 IMPLANT
FILTER SMOKE EVAC LAPAROSHD (FILTER) ×2 IMPLANT
FORMALIN 10 PREFIL 120ML (MISCELLANEOUS) ×2 IMPLANT
GLOVE BIO SURGEON STRL SZ 6.5 (GLOVE) ×1 IMPLANT
GLOVE BIOGEL PI IND STRL 7.0 (GLOVE) IMPLANT
GLOVE BIOGEL PI IND STRL 7.5 (GLOVE) IMPLANT
GLOVE BIOGEL PI INDICATOR 7.0 (GLOVE) ×2
GLOVE BIOGEL PI INDICATOR 7.5 (GLOVE) ×1
GLOVE ECLIPSE 6.5 STRL STRAW (GLOVE) ×1 IMPLANT
GLOVE SURG SS PI 7.5 STRL IVOR (GLOVE) ×2 IMPLANT
GOWN STRL REUS W/ TWL XL LVL3 (GOWN DISPOSABLE) ×1 IMPLANT
GOWN STRL REUS W/TWL LRG LVL3 (GOWN DISPOSABLE) ×4 IMPLANT
GOWN STRL REUS W/TWL XL LVL3 (GOWN DISPOSABLE) ×2
HEMOSTAT SNOW SURGICEL 2X4 (HEMOSTASIS) ×2 IMPLANT
INST SET LAPROSCOPIC AP (KITS) ×2 IMPLANT
IV NS IRRIG 3000ML ARTHROMATIC (IV SOLUTION) IMPLANT
KIT ROOM TURNOVER APOR (KITS) ×2 IMPLANT
MANIFOLD NEPTUNE II (INSTRUMENTS) ×2 IMPLANT
NDL BIOPSY 14X6 SOFT TISS (NEEDLE) IMPLANT
NDL INSUFFLATION 14GA 120MM (NEEDLE) ×1 IMPLANT
NDL INSUFFLATION 14GA 150MM (NEEDLE) IMPLANT
NEEDLE BIOPSY 14X6 SOFT TISS (NEEDLE) ×2 IMPLANT
NEEDLE INSUFFLATION 14GA 120MM (NEEDLE) ×2 IMPLANT
NEEDLE INSUFFLATION 14GA 150MM (NEEDLE) ×2 IMPLANT
NS IRRIG 1000ML POUR BTL (IV SOLUTION) ×2 IMPLANT
PACK LAP CHOLE LZT030E (CUSTOM PROCEDURE TRAY) ×2 IMPLANT
PAD ARMBOARD 7.5X6 YLW CONV (MISCELLANEOUS) ×2 IMPLANT
PAD TELFA 3X4 1S STER (GAUZE/BANDAGES/DRESSINGS) ×1 IMPLANT
POUCH SPECIMEN RETRIEVAL 10MM (ENDOMECHANICALS) ×2 IMPLANT
SET BASIN LINEN APH (SET/KITS/TRAYS/PACK) ×2 IMPLANT
SET TUBE IRRIG SUCTION NO TIP (IRRIGATION / IRRIGATOR) IMPLANT
SLEEVE ENDOPATH XCEL 5M (ENDOMECHANICALS) ×2 IMPLANT
SPONGE GAUZE 2X2 8PLY STRL LF (GAUZE/BANDAGES/DRESSINGS) ×5 IMPLANT
STAPLER VISISTAT (STAPLE) ×2 IMPLANT
SUT VICRYL 0 UR6 27IN ABS (SUTURE) ×2 IMPLANT
TAPE CLOTH SURG 4X10 WHT LF (GAUZE/BANDAGES/DRESSINGS) ×1 IMPLANT
TROCAR ENDO BLADELESS 11MM (ENDOMECHANICALS) ×2 IMPLANT
TROCAR XCEL NON-BLD 5MMX100MML (ENDOMECHANICALS) ×2 IMPLANT
TROCAR XCEL UNIV SLVE 11M 100M (ENDOMECHANICALS) ×2 IMPLANT
TUBING INSUFFLATION (TUBING) ×2 IMPLANT
WARMER LAPAROSCOPE (MISCELLANEOUS) ×2 IMPLANT
YANKAUER SUCT 12FT TUBE ARGYLE (SUCTIONS) ×2 IMPLANT

## 2015-02-25 NOTE — Anesthesia Procedure Notes (Signed)
Procedure Name: Intubation Date/Time: 02/25/2015 7:55 AM Performed by: Vista Deck Pre-anesthesia Checklist: Patient identified, Emergency Drugs available, Suction available, Patient being monitored and Timeout performed Patient Re-evaluated:Patient Re-evaluated prior to inductionOxygen Delivery Method: Circle system utilized Preoxygenation: Pre-oxygenation with 100% oxygen Intubation Type: IV induction, Rapid sequence and Cricoid Pressure applied Laryngoscope Size: Glidescope and 4 Grade View: Grade I Tube type: Oral Tube size: 7.0 mm Number of attempts: 1 Airway Equipment and Method: Video-laryngoscopy,  Stylet and Oral airway Placement Confirmation: ETT inserted through vocal cords under direct vision,  positive ETCO2 and breath sounds checked- equal and bilateral Secured at: 24 cm Tube secured with: Tape Dental Injury: Teeth and Oropharynx as per pre-operative assessment

## 2015-02-25 NOTE — Transfer of Care (Signed)
Immediate Anesthesia Transfer of Care Note  Patient: Nicholas [name]  Procedure(s) Performed: Procedure(s): LAPAROSCOPIC CHOLECYSTECTOMY (N/A)  Patient Location: PACU  Anesthesia Type:General  Level of Consciousness: sedated and patient cooperative  Airway & Oxygen Therapy: Patient Spontanous Breathing and non-rebreather face mask  Post-op Assessment: Report given to RN, Post -op Vital signs reviewed and stable and Patient moving all extremities  Post vital signs: Reviewed and stable    Complications: No apparent anesthesia complications

## 2015-02-25 NOTE — Anesthesia Preprocedure Evaluation (Signed)
Anesthesia Evaluation  Patient identified by MRN, date of birth, ID band Patient awake    Reviewed: Allergy & Precautions, NPO status , Patient's Chart, lab work & pertinent test results  Airway Mallampati: II  TM Distance: >3 FB Neck ROM: Full    Dental  (+) Edentulous Upper, Poor Dentition, Dental Advisory Given   Pulmonary shortness of breath and with exertion, former smoker,  breath sounds clear to auscultation        Cardiovascular hypertension, Pt. on medications + Peripheral Vascular Disease and +CHF Rhythm:Regular Rate:Normal     Neuro/Psych PSYCHIATRIC DISORDERS Depression  Neuromuscular disease    GI/Hepatic   Endo/Other  diabetes, Type 2, Insulin DependentMorbid obesity  Renal/GU Renal disease     Musculoskeletal   Abdominal   Peds  Hematology   Anesthesia Other Findings   Reproductive/Obstetrics                             Anesthesia Physical Anesthesia Plan  ASA: III  Anesthesia Plan: General   Post-op Pain Management:    Induction: Intravenous, Rapid sequence and Cricoid pressure planned  Airway Management Planned: Oral ETT  Additional Equipment:   Intra-op Plan:   Post-operative Plan: Extubation in OR  Informed Consent: I have reviewed the patients History and Physical, chart, labs and discussed the procedure including the risks, benefits and alternatives for the proposed anesthesia with the patient or authorized representative who has indicated his/her understanding and acceptance.     Plan Discussed with:   Anesthesia Plan Comments:         Anesthesia Quick Evaluation

## 2015-02-25 NOTE — Anesthesia Postprocedure Evaluation (Signed)
  Anesthesia Post-op Note  Patient: Nicholas [name]  Procedure(s) Performed: Procedure(s): LAPAROSCOPIC CHOLECYSTECTOMY (N/A)  Patient Location: PACU  Anesthesia Type:General  Level of Consciousness: awake and patient cooperative  Airway and Oxygen Therapy: Patient Spontanous Breathing and Patient connected to nasal cannula oxygen  Post-op Pain: mild  Post-op Assessment: Post-op Vital signs reviewed, Patient's Cardiovascular Status Stable, Respiratory Function Stable, Patent Airway and No signs of Nausea or vomiting              Post-op Vital Signs: Reviewed and stable  Last Vitals:  Filed Vitals:   02/25/15 1015  BP: 91/43  Pulse: 82  Temp:   Resp: 15    Complications: No apparent anesthesia complications

## 2015-02-25 NOTE — Discharge Instructions (Signed)

## 2015-02-25 NOTE — Op Note (Signed)
Patient:  Nicholas [name]  DOB:  [birth year]  MRN:  [id number]   Preop Diagnosis:  Cholecystitis, cholelithiasis  Postop Diagnosis:  Same, macronodular cirrhosis  Procedure:  Laparoscopic cholecystectomy, liver biopsy  Surgeon:  Aviva Signs, M.D.  Anes:  Gen. endotracheal  Indications:  Patient is a 59 year old morbidly obese white male small medical problems who presents with ongoing right upper quadrant abdominal pain. Ultrasound the gallbladder reveals cholelithiasis. The patient comes to the operating room for laparoscopic cholecystectomy. The risks and benefits of the procedure including bleeding, infection, hepatobiliary injury, the possibility of an open procedure were fully explained to the patient, who gave informed consent.  Procedure note:  The patient was placed in the supine position. After induction of general endotracheal anesthesia, the abdomen was prepped and draped using the usual sterile technique with DuraPrep. Surgical site confirmation was performed.  An incision was made along the midline superior to the umbilicus. The peritoneal cavity was entered into without difficulty using the open technique given the very thin abdominal wall. An 11 trocar was then inserted in the abdomen was insufflated to 17 mmHg pressure. The patient was placed in deeper reverse Trendelenburg position and additional 11 mm trocar was placed the epigastric region and 5 mm trochars were placed in the right upper quadrant and right flank regions. Liver was inspected and had evidence of macronodular cirrhosis. A Tru-Cut needle biopsy of the right lobe of liver was performed. This was sent to pathology for further examination. The biopsy site was cauterized using Bovie electrocautery. The gallbladder was then retracted in a dynamic fashion in order to expose the triangle of Calot.  The gallbladder was decompressed in its contents aspirated. The cystic duct was first identified. Its juncture to the  infundibulum was fully identified. Endoclips were placed proximally and distally on the cystic duct, and the cystic duct was divided. This was likewise done to the cystic artery. The gallbladder was freed away from the gallbladder fossa using Bovie electrocautery. The gallbladder was delivered through the epigastric trocar site using an Endo Catch bag. The gallbladder fossa was inspected and no abnormal bleeding or bile leakage was noted. Surgicel was placed in the gallbladder fossa. All fluid and air were then evacuated from the abdominal cavity prior to removal of the trochars.  All wounds were irrigated with normal saline. All wounds were injected with 0.5% Sensorcaine. The supraumbilical fascia as well as epigastric fascia were reapproximated using 0 Vicryl interrupted sutures. All skin incisions were closed using staples. Betadine ointment and dry sterile dressings were applied.  All tape and needle counts were correct at the end of the procedure. The patient was extubated in the operating room and transferred to PACU in stable condition.    Complications:  None  EBL:  Minimal  Specimen:  Gallbladder, liver biopsy

## 2015-02-25 NOTE — Interval H&P Note (Signed)
History and Physical Interval Note:  02/25/2015 7:21 AM  Nicholas [name]  has presented today for surgery, with the diagnosis of cholelithiasis  The various methods of treatment have been discussed with the patient and family. After consideration of risks, benefits and other options for treatment, the patient has consented to  Procedure(s): LAPAROSCOPIC CHOLECYSTECTOMY (N/A) as a surgical intervention .  The patient's history has been reviewed, patient examined, no change in status, stable for surgery.  I have reviewed the patient's chart and labs.  Questions were answered to the patient's satisfaction.     Aviva Signs A

## 2015-02-26 ENCOUNTER — Encounter (HOSPITAL_COMMUNITY): Payer: Self-pay | Admitting: General Surgery

## 2015-02-26 DIAGNOSIS — K8010 Calculus of gallbladder with chronic cholecystitis without obstruction: Secondary | ICD-10-CM | POA: Diagnosis not present

## 2015-02-26 LAB — BASIC METABOLIC PANEL
Anion gap: 8 (ref 5–15)
BUN: 18 mg/dL (ref 6–20)
CHLORIDE: 100 mmol/L — AB (ref 101–111)
CO2: 29 mmol/L (ref 22–32)
CREATININE: 1.15 mg/dL (ref 0.61–1.24)
Calcium: 8.2 mg/dL — ABNORMAL LOW (ref 8.9–10.3)
GFR calc Af Amer: >60 mL/min (ref >60)
GLUCOSE: 222 mg/dL — AB (ref 65–99)
Potassium: 5.1 mmol/L (ref 3.5–5.1)
SODIUM: 137 mmol/L (ref 135–145)

## 2015-02-26 LAB — GLUCOSE, CAPILLARY: GLUCOSE-CAPILLARY: 238 mg/dL — AB (ref 65–99)

## 2015-02-26 LAB — HEPATIC FUNCTION PANEL
ALT: 37 U/L (ref 17–63)
AST: 35 U/L (ref 15–41)
Albumin: 3.5 g/dL (ref 3.5–5.0)
Alkaline Phosphatase: 61 U/L (ref 38–126)
BILIRUBIN DIRECT: 0.1 mg/dL (ref 0.1–0.5)
BILIRUBIN INDIRECT: 0.9 mg/dL (ref 0.3–0.9)
BILIRUBIN TOTAL: 1 mg/dL (ref 0.3–1.2)
Total Protein: 6.2 g/dL — ABNORMAL LOW (ref 6.5–8.1)

## 2015-02-26 LAB — CBC
HCT: 45.3 % (ref 39.0–52.0)
Hemoglobin: 14.8 g/dL (ref 13.0–17.0)
MCH: 30 pg (ref 26.0–34.0)
MCHC: 32.7 g/dL (ref 30.0–36.0)
MCV: 91.9 fL (ref 78.0–100.0)
PLATELETS: 175 10*3/uL (ref 150–400)
RBC: 4.93 MIL/uL (ref 4.22–5.81)
RDW: 15.7 % — AB (ref 11.5–15.5)
WBC: 15.3 10*3/uL — AB (ref 4.0–10.5)

## 2015-02-26 NOTE — Addendum Note (Signed)
Addendum  created 02/26/15 0726 by Ollen Bowl, CRNA   Modules edited: Notes Section   Notes Section:  File: [id number]

## 2015-02-26 NOTE — Discharge Summary (Signed)
Physician Discharge Summary  Patient ID: Nicholas [name] MRN: [id number] DOB/AGE: 12-13-1955 59 y.o.  Admit date: 02/25/2015 Discharge date: 02/26/2015  Admission Diagnoses: Cholecystitis, cholelithiasis  Discharge Diagnoses: Same, cirrhosis  Active Problems:   Respiratory depression  Morbid obesity, insulin-dependent diabetes mellitus, sleep apnea Discharged Condition: good  Hospital Course: Patient is a 59 year old white male who underwent laparoscopic cholecystectomy with liver biopsy on 02/25/2015. Tolerated the procedure well. He did have some episodes of respiratory depression in the PACU, thus he was put in the stepdown unit for further monitoring and recovery. His diet was advanced at difficulty. His respiratory depression resolved. He desires to go home. He is being discharged home in postoperative day one in good and improving condition.  Treatments: surgery: Laparoscopic cholecystectomy, liver biopsy on 02/25/2015  Discharge Exam: Blood pressure 152/66, pulse 102, temperature 98.4 F (36.9 C), temperature source Oral, resp. rate 24, height 6' (1.829 m), weight 213 kg (469 lb 9.3 oz), SpO2 89 %. General appearance: alert, cooperative and no distress Resp: clear to auscultation bilaterally Cardio: regular rate and rhythm, S1, S2 normal, no murmur, click, rub or gallop GI: Soft, incisions healing well.  Disposition: 01-Home or Self Care     Medication List    STOP taking these medications        buPROPion 150 MG 24 hr tablet  Commonly known as:  WELLBUTRIN XL     pantoprazole 40 MG tablet  Commonly known as:  PROTONIX      TAKE these medications        ACCU-CHEK AVIVA PLUS test strip  Generic drug:  glucose blood  TEST FOUR TIMES A DAY AS DIRECTED.     ANDROGEL PUMP TD  Place 75 g onto the skin every morning.     ASPIRIN LOW DOSE 81 MG EC tablet  Generic drug:  aspirin  Take 81 mg by mouth daily. Take one tablet by mouth once a day     B-D INS SYR  ULTRAFINE 1CC/31G 31G X 5/16" 1 ML Misc  Generic drug:  Insulin Syringe-Needle U-100  USE AS DIRECTED WITH INSULIN VIALS TWICE DAILY.     B-D INS SYRINGE 0.5CC/31GX5/16 31G X 5/16" 0.5 ML Misc  Generic drug:  Insulin Syringe-Needle U-100  USE TO INJECT THREE TIMES DAILY.     brimonidine 0.1 % Soln  Commonly known as:  ALPHAGAN P  Place 1 drop into both eyes 2 (two) times daily. One drop in each eye two times a day     citalopram 40 MG tablet  Commonly known as:  CELEXA  TAKE 1 TABLET BY MOUTH ONCE A DAY.     clotrimazole-betamethasone cream  Commonly known as:  LOTRISONE  APPLY TO AFFECTED AREAS TWICE DAILY.     COMBIGAN 0.2-0.5 % ophthalmic solution  Generic drug:  brimonidine-timolol  Place 1 drop into both eyes every 12 (twelve) hours. One drop in each eye two times a day     cyclobenzaprine 10 MG tablet  Commonly known as:  FLEXERIL  TAKE (1) TABLET BY MOUTH EVERY EIGHT HOURS AS NEEDED FOR MUSCLE SPASMS.     furosemide 20 MG tablet  Commonly known as:  LASIX  Take 1 tablet (20 mg total) by mouth daily.     gabapentin 300 MG capsule  Commonly known as:  NEURONTIN  TAKE ONE CAPSULE BY MOUTH DAILY.     HUMULIN R 100 units/mL injection  Generic drug:  insulin regular  Inject 25 Units into the skin 3 (three)  times daily before meals.     HYDROcodone-acetaminophen 5-325 MG per tablet  Commonly known as:  NORCO  Take 1-2 tablets by mouth every 6 (six) hours as needed for moderate pain.     insulin aspart 100 UNIT/ML injection  Commonly known as:  novoLOG  Inject 25 Units into the skin 3 (three) times daily before meals.     ketoconazole 2 % shampoo  Commonly known as:  NIZORAL  APPLY TOPICALLY TWICE WEEKLY AS DIRECTED.     lamoTRIgine 200 MG tablet  Commonly known as:  LAMICTAL  TAKE 1 TABLET BY MOUTH ONCE A DAY.     lisinopril 5 MG tablet  Commonly known as:  PRINIVIL,ZESTRIL  TAKE 1 TABLET BY MOUTH ONCE A DAY.     nystatin powder  Commonly known as:   MYCOSTATIN  APPLY TO AFFECTED AREA TWICE DAILY FOR 10 DAYS.     nystatin-triamcinolone ointment  Commonly known as:  MYCOLOG  APPLY TO AFFECTED AREA TWICE DAILY.     potassium chloride SA 20 MEQ tablet  Commonly known as:  KLOR-CON M20  Take 1 tablet (20 mEq total) by mouth daily.     simvastatin 40 MG tablet  Commonly known as:  ZOCOR  TAKE 1 TABLET BY MOUTH ONCE A DAY.     traMADol 50 MG tablet  Commonly known as:  ULTRAM  TAKE 1 TABLET BY MOUTH ONCE DAILY AS NEEDED FOR KNEE PAIN.     triamcinolone ointment 0.1 %  Commonly known as:  KENALOG  APPLY TO AFFECTED AREAS TWICE DAILY.     VICTOZA 18 MG/3ML Sopn  Generic drug:  Liraglutide  Inject 1.8 mg into the skin at bedtime.     Vitamin D (Ergocalciferol) 50000 UNITS Caps capsule  Commonly known as:  DRISDOL  Take 50,000 Units by mouth every 7 (seven) days.           Follow-up Information    Follow up with Jamesetta So, MD. Schedule an appointment as soon as possible for a visit on 03/07/2015.   Specialty:  General Surgery   Contact information:   [address] [place] [address] [phone]       Signed: Aviva Signs A 02/26/2015, 7:56 AM

## 2015-02-26 NOTE — Progress Notes (Signed)
Patient being discharged to home. Patient given discharge instruction along with prescription for pain medication at home. Hetty Blend is helping patient get dressed and will be taken down to car by wheelchair. Patients telemetry was removed and IV access removed intact and covered with bandaid with no bleeding. No complaints of pain or other discomforts.

## 2015-02-26 NOTE — Anesthesia Postprocedure Evaluation (Signed)
  Anesthesia Post-op Note  Patient: Nicholas [name]  Procedure(s) Performed: Procedure(s): LAPAROSCOPIC CHOLECYSTECTOMY (N/A)  Patient Location: ICU  Anesthesia Type:General  Level of Consciousness: awake, alert  and oriented  Airway and Oxygen Therapy: Patient Spontanous Breathing and Patient connected to nasal cannula oxygen  Post-op Pain: mild  Post-op Assessment: Post-op Vital signs reviewed, Patient's Cardiovascular Status Stable, Respiratory Function Stable, Patent Airway and No signs of Nausea or vomiting              Post-op Vital Signs: Reviewed and stable  Last Vitals:  Filed Vitals:   02/26/15 0720  BP:   Pulse:   Temp: 36.9 C  Resp:     Complications: No apparent anesthesia complications

## 2015-03-07 ENCOUNTER — Other Ambulatory Visit: Payer: Self-pay | Admitting: Family Medicine

## 2015-03-08 LAB — HEMOGLOBIN A1C: Hgb A1c MFr Bld: 9 % — AB (ref 4.0–6.0)

## 2015-03-11 LAB — HM DIABETES EYE EXAM

## 2015-03-13 ENCOUNTER — Other Ambulatory Visit: Payer: Self-pay | Admitting: Family Medicine

## 2015-03-19 ENCOUNTER — Other Ambulatory Visit: Payer: Self-pay | Admitting: Family Medicine

## 2015-03-22 ENCOUNTER — Encounter: Payer: Self-pay | Admitting: Podiatry

## 2015-03-25 ENCOUNTER — Ambulatory Visit (INDEPENDENT_AMBULATORY_CARE_PROVIDER_SITE_OTHER): Payer: PPO | Admitting: Podiatry

## 2015-03-25 DIAGNOSIS — M2041 Other hammer toe(s) (acquired), right foot: Secondary | ICD-10-CM

## 2015-03-25 DIAGNOSIS — E1121 Type 2 diabetes mellitus with diabetic nephropathy: Secondary | ICD-10-CM

## 2015-03-25 DIAGNOSIS — E1140 Type 2 diabetes mellitus with diabetic neuropathy, unspecified: Secondary | ICD-10-CM | POA: Diagnosis not present

## 2015-03-25 DIAGNOSIS — M2142 Flat foot [pes planus] (acquired), left foot: Secondary | ICD-10-CM | POA: Diagnosis not present

## 2015-03-25 DIAGNOSIS — I739 Peripheral vascular disease, unspecified: Secondary | ICD-10-CM

## 2015-03-25 DIAGNOSIS — E1149 Type 2 diabetes mellitus with other diabetic neurological complication: Secondary | ICD-10-CM

## 2015-03-25 DIAGNOSIS — M2141 Flat foot [pes planus] (acquired), right foot: Secondary | ICD-10-CM | POA: Diagnosis not present

## 2015-03-25 DIAGNOSIS — L84 Corns and callosities: Secondary | ICD-10-CM | POA: Diagnosis not present

## 2015-03-25 DIAGNOSIS — E11628 Type 2 diabetes mellitus with other skin complications: Secondary | ICD-10-CM

## 2015-03-25 DIAGNOSIS — M2042 Other hammer toe(s) (acquired), left foot: Secondary | ICD-10-CM

## 2015-03-25 NOTE — Progress Notes (Signed)
Patient ID: Nicholas [name], male   DOB: 09-04-1955, 59 y.o.   MRN: [id number] Patient presents for diabetic shoe pick up, shoes are tried on for good fit.  Patient received 1 Pair Rolena Infante [id number] Beast 14 [employer] and silver in mens 11.5 wide and 3 pairs custom molded diabetic inserts.  Verbal and written break in and wear instructions given.  Patient will follow up for scheduled routine care. Denies any acute changes since last appointment and no other complaints at this time.

## 2015-03-25 NOTE — Patient Instructions (Signed)

## 2015-03-28 ENCOUNTER — Telehealth: Payer: Self-pay

## 2015-03-28 NOTE — Telephone Encounter (Signed)
Noted and thank you. He should avoid any ETOH as well as large doses of tylenol. As you informed him, lifestyle modifications will be essential for him. We can mail him information on cirrhosis and appropriate lifestyle modification or print them for him while he's here in a couple weeks.

## 2015-03-28 NOTE — Telephone Encounter (Signed)
Pt called- he had his gallbladder removed by Dr.Jenkins and had a liver bx at the same time. Pt stated bx came back showing cirrhosis. Pt is very scared. He said he has talked to his friends and family and they have told him he his going to die. Pt has appt on 04/17/15 with EG. Pt thinks he doesn't have that long to live with his new diagnosis. I went over cirrhosis with the patient and informed him that he was going to have to make some lifestyle modifications (diet, exercise, no alcohol etc) and he would have routine follow ups here with bloodwork and U/S appointments and that EG would go over all of these recommendations with him at his ov. I have assured the patient that he is not likely to die from cirrhosis today. He is not having any immediate complications at this time, no jaundice, no swelling, no SOB.  Pt verbalized understanding and stated he felt better now.   Pt has been doing well since he had his gallbladder out until about a week ago. He has been having some L side abd pain and pain in his stomach that comes and goes. No N/V, no fever, bm's are normal, no blood in his stool. Is there anything he should be doing until his ov?

## 2015-03-29 NOTE — Telephone Encounter (Signed)
Tried to call pt- NA- LMOM. Info in the mail to the pt.

## 2015-04-02 ENCOUNTER — Other Ambulatory Visit: Payer: Self-pay | Admitting: Family Medicine

## 2015-04-02 ENCOUNTER — Other Ambulatory Visit: Payer: Self-pay | Admitting: "Endocrinology

## 2015-04-10 ENCOUNTER — Ambulatory Visit (INDEPENDENT_AMBULATORY_CARE_PROVIDER_SITE_OTHER): Payer: PPO | Admitting: "Endocrinology

## 2015-04-10 ENCOUNTER — Encounter: Payer: Self-pay | Admitting: "Endocrinology

## 2015-04-10 VITALS — BP 138/85 | HR 72 | Ht 70.0 in | Wt >= 6400 oz

## 2015-04-10 DIAGNOSIS — E785 Hyperlipidemia, unspecified: Secondary | ICD-10-CM | POA: Diagnosis not present

## 2015-04-10 DIAGNOSIS — E11628 Type 2 diabetes mellitus with other skin complications: Secondary | ICD-10-CM

## 2015-04-10 DIAGNOSIS — E782 Mixed hyperlipidemia: Secondary | ICD-10-CM | POA: Insufficient documentation

## 2015-04-10 DIAGNOSIS — I10 Essential (primary) hypertension: Secondary | ICD-10-CM

## 2015-04-10 DIAGNOSIS — L84 Corns and callosities: Secondary | ICD-10-CM

## 2015-04-10 NOTE — Progress Notes (Signed)
Subjective:    Patient ID: Nicholas [name], male    DOB: June 23, 1956,    Past Medical History  Diagnosis Date  . Diabetes ([place])   . Hyperlipidemia   . Testosterone deficiency   . Hypertension   . Poor circulation     leg   . MRSA (methicillin resistant Staphylococcus aureus)   . Chronic knee pain   . Depression   . Glaucoma   . Suicide attempt [facility])    Past Surgical History  Procedure Laterality Date  . Kidney stones      cysto with litholapexy  . Colonoscopy  03/2008    KXF:[id number] external hemorrhoidal tag, otherwise normal rectum/Two diminutive rectosigmoid polyps s/p bx/ Polyp in the opposite the ileocecal valve s/p bx. tubular adenoma.  . Colonoscopy N/A 03/23/2014    BZJ:[id number] colon polyps throughout his rectum and colon. Sessile and pedunculated. The largest polyp was proximally 6 mm in dimensions in the retum at 3 cm with adjacent diminutive polyp.  There was also a polyps on ileocecal valve, ascending segmen descending and sigmoid segment. as the colon was tortuous & elongated requiring externa abd pressure and changing of the pts postion to reach cecum  . Esophagogastroduodenoscopy N/A 03/23/2014    RMR: 1. Patient had circumferential distal esophageal erosions within 5 mm of the GE junction.  No Barrett's esophagus. Tubular esophagus patent throughout its course. 2.  Diffuse gastric erosions. (1) 63mm area of healing ulceration in the antrum. No ulcer or infiltrating process observed. Patent pylorus. Normal-appearing first and second portion of the duodenum .  Marland Kitchen Cholecystectomy N/A 02/25/2015    Procedure: LAPAROSCOPIC CHOLECYSTECTOMY;  Surgeon: Aviva Signs, MD;  Location: [facility];  Service: General;  Laterality: N/A;   Social History   Social History  . Marital Status: Divorced    Spouse Name: N/A  . Number of Children: 0  . Years of Education: 12   Occupational History  . DISABLED   .     Social History Main Topics  . Smoking status: Former Smoker -- 1.50  packs/day for 25 years    Types: Cigarettes  . Smokeless tobacco: Former Systems developer    Quit date: 02/20/2002     Comment: Quit x 13 years  . Alcohol Use: No  . Drug Use: No  . Sexual Activity: Not Currently    Birth Control/ Protection: None   Other Topics Concern  . None   Social History Narrative   Outpatient Encounter Prescriptions as of 04/10/2015  Medication Sig  . aspirin (ASPIRIN LOW DOSE) 81 MG EC tablet Take 81 mg by mouth daily. Take one tablet by mouth once a day   . citalopram (CELEXA) 40 MG tablet TAKE 1 TABLET BY MOUTH ONCE A DAY.  . cyclobenzaprine (FLEXERIL) 10 MG tablet TAKE (1) TABLET BY MOUTH EVERY EIGHT HOURS AS NEEDED FOR MUSCLE SPASMS.  . furosemide (LASIX) 20 MG tablet Take 1 tablet (20 mg total) by mouth daily.  Marland Kitchen gabapentin (NEURONTIN) 300 MG capsule TAKE ONE CAPSULE BY MOUTH DAILY.  Marland Kitchen HYDROcodone-acetaminophen (NORCO) 5-325 MG per tablet Take 1-2 tablets by mouth every 6 (six) hours as needed for moderate pain.  Marland Kitchen lamoTRIgine (LAMICTAL) 200 MG tablet TAKE 1 TABLET BY MOUTH ONCE A DAY.  Marland Kitchen Liraglutide (VICTOZA) 18 MG/3ML SOPN Inject 1.8 mg into the skin at bedtime.  Marland Kitchen lisinopril (PRINIVIL,ZESTRIL) 5 MG tablet TAKE 1 TABLET BY MOUTH ONCE A DAY.  Marland Kitchen nystatin-triamcinolone ointment (MYCOLOG) APPLY TO AFFECTED AREA TWICE DAILY.  Marland Kitchen  potassium chloride SA (KLOR-CON M20) 20 MEQ tablet Take 1 tablet (20 mEq total) by mouth daily.  . simvastatin (ZOCOR) 40 MG tablet TAKE 1 TABLET BY MOUTH ONCE A DAY.  Marland Kitchen Testosterone (ANDROGEL PUMP TD) Place 75 g onto the skin every morning.  . traMADol (ULTRAM) 50 MG tablet TAKE 1 TABLET BY MOUTH ONCE DAILY AS NEEDED FOR KNEE PAIN.  Marland Kitchen triamcinolone ointment (KENALOG) 0.1 % APPLY TO AFFECTED AREAS TWICE DAILY.  Marland Kitchen Vitamin D, Ergocalciferol, (DRISDOL) 50000 UNITS CAPS capsule TAKE ONE CAPSULE BY MOUTH ONCE WEEKLY.  Marland Kitchen ACCU-CHEK AVIVA PLUS test strip TEST FOUR TIMES A DAY AS DIRECTED.  Marland Kitchen B-D INS SYR ULTRAFINE 1CC/31G 31G X 5/16" 1 ML MISC USE AS  DIRECTED WITH INSULIN VIALS TWICE DAILY.  Marland Kitchen B-D INS SYRINGE 0.5CC/31GX5/16 31G X 5/16" 0.5 ML MISC USE TO INJECT THREE TIMES DAILY.  . brimonidine (ALPHAGAN P) 0.1 % SOLN Place 1 drop into both eyes 2 (two) times daily. One drop in each eye two times a day   . brimonidine-timolol (COMBIGAN) 0.2-0.5 % ophthalmic solution Place 1 drop into both eyes every 12 (twelve) hours. One drop in each eye two times a day    . clotrimazole-betamethasone (LOTRISONE) cream APPLY TO AFFECTED AREAS TWICE DAILY.  Marland Kitchen ketoconazole (NIZORAL) 2 % shampoo APPLY TOPICALLY TWICE WEEKLY AS DIRECTED.  Marland Kitchen NOVOFINE 30G X 8 MM MISC USE TWICE DAILY WITH BYETTA  . nystatin (MYCOSTATIN) powder APPLY TO AFFECTED AREA TWICE DAILY FOR 10 DAYS.  . [DISCONTINUED] insulin aspart (NOVOLOG) 100 UNIT/ML injection Inject into the skin 3 (three) times daily before meals.   . [DISCONTINUED] insulin regular (HUMULIN R) 100 units/mL injection Inject 25 Units into the skin 3 (three) times daily before meals.   No facility-administered encounter medications on file as of 04/10/2015.   ALLERGIES: No Known Allergies VACCINATION STATUS: Immunization History  Administered Date(s) Administered  . Influenza Whole 04/01/2010  . Influenza,inj,Quad PF,36+ Mos 03/08/2013  . Pneumococcal Conjugate-13 07/09/2014  . Pneumococcal Polysaccharide-23 04/01/2010  . Tdap 11/24/2011  . Zoster 12/12/2014    Diabetes He presents for his follow-up diabetic visit. He has type 2 diabetes mellitus. His disease course has been improving. There are no hypoglycemic associated symptoms. Pertinent negatives for hypoglycemia include no confusion, headaches, pallor or seizures. There are no diabetic associated symptoms. Pertinent negatives for diabetes include no chest pain, no fatigue, no polydipsia, no polyphagia, no polyuria and no weakness. There are no hypoglycemic complications. Symptoms are improving. There are no diabetic complications. He is compliant with  treatment most of the time. His weight is stable. He is following a generally unhealthy diet. He rarely participates in exercise. His home blood glucose trend is decreasing steadily. His overall blood glucose range is 180-200 mg/dl. An ACE inhibitor/angiotensin II receptor blocker is being taken.  Hyperlipidemia This is a chronic problem. The current episode started more than 1 year ago. Exacerbating diseases include diabetes and obesity. Pertinent negatives include no chest pain, myalgias or shortness of breath. Current antihyperlipidemic treatment includes statins. Risk factors for coronary artery disease include a sedentary lifestyle, male sex, diabetes mellitus, dyslipidemia and obesity.     Review of Systems  Constitutional: Negative for fatigue and unexpected weight change.  HENT: Negative for dental problem, mouth sores and trouble swallowing.   Eyes: Negative for visual disturbance.  Respiratory: Negative for cough, choking, chest tightness, shortness of breath and wheezing.   Cardiovascular: Negative for chest pain, palpitations and leg swelling.  Gastrointestinal: Negative for nausea,  vomiting, abdominal pain, diarrhea, constipation and abdominal distention.  Endocrine: Negative for polydipsia, polyphagia and polyuria.  Genitourinary: Negative for dysuria, urgency, hematuria and flank pain.  Musculoskeletal: Negative for myalgias, back pain, gait problem and neck pain.  Skin: Negative for pallor, rash and wound.  Neurological: Negative for seizures, syncope, weakness, numbness and headaches.  Psychiatric/Behavioral: Negative.  Negative for confusion and dysphoric mood.    Objective:    BP 138/85 mmHg  Pulse 72  Ht 5\' 10"  (1.778 m)  Wt 447 lb (202.758 kg)  BMI 64.14 kg/m2  SpO2 75%  Wt Readings from Last 3 Encounters:  04/10/15 447 lb (202.758 kg)  02/26/15 469 lb 9.3 oz (213 kg)  02/21/15 454 lb (205.933 kg)    Physical Exam  Constitutional: He is oriented to person,  place, and time. He appears well-developed and well-nourished. He is cooperative. No distress.  HENT:  Head: Normocephalic and atraumatic.  Eyes: EOM are normal.  Neck: Normal range of motion. Neck supple. No tracheal deviation present. No thyromegaly present.  Cardiovascular: Normal rate, S1 normal, S2 normal and normal heart sounds.  Exam reveals no gallop.   No murmur heard. Pulses:      Dorsalis pedis pulses are 1+ on the right side, and 1+ on the left side.       Posterior tibial pulses are 1+ on the right side, and 1+ on the left side.  Pulmonary/Chest: Breath sounds normal. No respiratory distress. He has no wheezes.  Abdominal: Soft. Bowel sounds are normal. He exhibits no distension. There is no tenderness. There is no guarding and no CVA tenderness.  Musculoskeletal: He exhibits no edema.       Right shoulder: He exhibits no swelling and no deformity.  Neurological: He is alert and oriented to person, place, and time. He has normal strength and normal reflexes. No cranial nerve deficit or sensory deficit. Gait normal.  Skin: Skin is warm and dry. No rash noted. No cyanosis. Nails show no clubbing.  He has extensive tattoos.  Psychiatric: He has a normal mood and affect. His speech is normal and behavior is normal. Judgment and thought content normal. Cognition and memory are normal.    Results for orders placed or performed in visit on 04/10/15  Hemoglobin A1c  Result Value Ref Range   Hgb A1c MFr Bld 9.0 (A) 4.0 - 6.0 %   Complete Blood Count (Most recent): Lab Results  Component Value Date   WBC 15.3* 02/26/2015   HGB 14.8 02/26/2015   HCT 45.3 02/26/2015   MCV 91.9 02/26/2015   PLT 175 02/26/2015   Chemistry (most recent): Lab Results  Component Value Date   NA 137 02/26/2015   K 5.1 02/26/2015   CL 100* 02/26/2015   CO2 29 02/26/2015   BUN 18 02/26/2015   CREATININE 1.15 02/26/2015   Diabetic Labs (most recent): Lab Results  Component Value Date   HGBA1C  9.0* 03/08/2015   HGBA1C 9.6* 02/13/2015   HGBA1C 11.3* 09/18/2014   Lipid profile (most recent): Lab Results  Component Value Date   TRIG 236* 02/13/2015   CHOL 168 02/13/2015         Assessment & Plan:   1. 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  Patient came with Humulin U500 insulin pens for demonstration. His recent A1c of 9 %.  He did not bring his logs nor meter. He is on  U500 using u100 syring 25  "units " TID. - Patient remains at a high risk for more acute and chronic complications of diabetes which include CAD, CVA, CKD, retinopathy, and neuropathy. These are all discussed in detail with the patient.  - I have re-counseled the patient on diet management and weight loss  by adopting a carbohydrate restricted / protein rich  Diet. - Patient is advised to stick to a routine mealtimes to eat 3 meals  a day and avoid unnecessary snacks ( to snack only to correct hypoglycemia).  - Suggestion is made for patient to avoid simple carbohydrates   from their diet including Cakes , Desserts, Ice Cream,  Soda (  diet and regular) , Sweet Tea , Candies,  Chips, Cookies, Artificial Sweeteners,   and "Sugar-free" Products .  This will help patient to have stable blood glucose profile and potentially avoid unintended  Weight gain.  - The patient  has been  scheduled with Jearld Fenton, RDN, CDE for individualized DM education. - I have approached patient with the following individualized plan to manage diabetes and patient agrees.  - I will proceed to the switch him to Humulin U500 pen 100 units 3 times a day before meals for pre-meal blood glucose above 90 mg/dL associated with strict monitoring of glucose  AC and HS. - Patient is warned not to take insulin without proper monitoring per orders.  -Patient is encouraged to call clinic for blood glucose levels less than 70 or above 300 mg /dl. - I will  continue Victoza 1.8 mg subcutaneous daily, therapeutically suitable for patient.. -Patient does not tolerate metformin.   - Patient specific target  for A1c; LDL, HDL, Triglycerides, and  Waist Circumference were discussed in detail.  2) BP/HTN: Controlled. Continue current medications including ACEI. 3) Lipids/HPL:  continue statins. 4)  Weight/Diet: CDE consult in progress, exercise, and carbohydrates information provided.  5) Chronic Care/Health Maintenance:  -Patient is on ACEI and Statin medications and encouraged to continue to follow up with Ophthalmology, Podiatrist at least yearly or according to recommendations, and advised to  stay away from smoking. I have recommended yearly flu vaccine and pneumonia vaccination at least every 5 years; moderate intensity exercise for up to 150 minutes weekly; and  sleep for at least 7 hours a day.  I advised patient to maintain close follow up with their PCP for primary care needs.  Patient is asked to bring meter and  blood glucose logs during their next visit.   Follow up plan: Return in about 3 months (around 07/11/2015) for diabetes, high blood pressure, high cholesterol, follow up with pre-visit labs, meter, and logs.  Glade Lloyd, MD Phone: [phone]  Fax: [phone]   04/10/2015, 10:33 AM

## 2015-04-10 NOTE — Patient Instructions (Signed)

## 2015-04-17 ENCOUNTER — Ambulatory Visit (INDEPENDENT_AMBULATORY_CARE_PROVIDER_SITE_OTHER): Payer: PPO | Admitting: Nurse Practitioner

## 2015-04-17 ENCOUNTER — Encounter: Payer: Self-pay | Admitting: Nurse Practitioner

## 2015-04-17 ENCOUNTER — Telehealth: Payer: Self-pay | Admitting: *Deleted

## 2015-04-17 VITALS — BP 135/66 | HR 64 | Temp 98.5°F | Ht 72.0 in | Wt >= 6400 oz

## 2015-04-17 DIAGNOSIS — R1011 Right upper quadrant pain: Secondary | ICD-10-CM

## 2015-04-17 DIAGNOSIS — K2970 Gastritis, unspecified, without bleeding: Secondary | ICD-10-CM

## 2015-04-17 NOTE — Assessment & Plan Note (Signed)
Abdominal pain completely resolved status post laparoscopic cholecystectomy. Continue PPIs noted above. Return for follow-up in one year, or sooner if needed for any GI symptoms.

## 2015-04-17 NOTE — Telephone Encounter (Signed)
Patient called lmom requesting an antibiotic for a cold. Please advise

## 2015-04-17 NOTE — Progress Notes (Signed)
cc'ed to pcp °

## 2015-04-17 NOTE — Progress Notes (Signed)
Referring Provider: Fayrene Helper, MD Primary Care Physician:  Tula Nakayama, MD Primary GI:  Dr. Gala Romney  Chief Complaint  Patient presents with  . Follow-up    HPI:   59 year old male presents for follow-up on gastritis, GERD, abdominal pain. Last seen in our office 01/15/2015. He has a history of distal esophageal erosions and diffuse gastric erosions as well as healed ulceration in the antrum. Last endoscopy 03/23/2014. Abdominal pain was a bit different from his gastritis pain, was left on twice a day PPI. CT abdomen was ordered to further evaluate his abdominal pain and subsequent referral to surgery was made for possible elective cholecystectomy. Cholecystectomy was completed on 02/25/2015.  Today he states his abdominal pain is significantly improved. Continues PPI bid as prescribed. Has a cold today. Denies abdominal pain, N/V, post-op complications, hematochezia, melena. Denies chest pain, dyspnea, dizziness, lightheadedness, syncope, near syncope. Denies any other upper or lower GI symptoms.  Past Medical History  Diagnosis Date  . Diabetes ([place])   . Hyperlipidemia   . Testosterone deficiency   . Hypertension   . Poor circulation     leg   . MRSA (methicillin resistant Staphylococcus aureus)   . Chronic knee pain   . Depression   . Glaucoma   . Suicide attempt [facility])     Past Surgical History  Procedure Laterality Date  . Kidney stones      cysto with litholapexy  . Colonoscopy  03/2008    IWP:[id number] external hemorrhoidal tag, otherwise normal rectum/Two diminutive rectosigmoid polyps s/p bx/ Polyp in the opposite the ileocecal valve s/p bx. tubular adenoma.  . Colonoscopy N/A 03/23/2014    [id number]:[id number] colon polyps throughout his rectum and colon. Sessile and pedunculated. The largest polyp was proximally 6 mm in dimensions in the retum at 3 cm with adjacent diminutive polyp.  There was also a polyps on ileocecal valve, ascending segmen descending and  sigmoid segment. as the colon was tortuous & elongated requiring externa abd pressure and changing of the pts postion to reach cecum  . Esophagogastroduodenoscopy N/A 03/23/2014    RMR: 1. Patient had circumferential distal esophageal erosions within 5 mm of the GE junction.  No Barrett's esophagus. Tubular esophagus patent throughout its course. 2.  Diffuse gastric erosions. (1) 2mm area of healing ulceration in the antrum. No ulcer or infiltrating process observed. Patent pylorus. Normal-appearing first and second portion of the duodenum .  Marland Kitchen Cholecystectomy N/A 02/25/2015    Procedure: LAPAROSCOPIC CHOLECYSTECTOMY;  Surgeon: Aviva Signs, MD;  Location: [facility];  Service: General;  Laterality: N/A;    Current Outpatient Prescriptions  Medication Sig Dispense Refill  . ACCU-CHEK AVIVA PLUS test strip TEST FOUR TIMES A DAY AS DIRECTED. 100 each 5  . aspirin (ASPIRIN LOW DOSE) 81 MG EC tablet Take 81 mg by mouth daily. Take one tablet by mouth once a day     . B-D INS SYR ULTRAFINE 1CC/31G 31G X 5/16" 1 ML MISC USE AS DIRECTED WITH INSULIN VIALS TWICE DAILY. 100 each 2  . B-D INS SYRINGE 0.5CC/31GX5/16 31G X 5/16" 0.5 ML MISC USE TO INJECT THREE TIMES DAILY. 100 each PRN  . brimonidine (ALPHAGAN P) 0.1 % SOLN Place 1 drop into both eyes 2 (two) times daily. One drop in each eye two times a day     . brimonidine-timolol (COMBIGAN) 0.2-0.5 % ophthalmic solution Place 1 drop into both eyes every 12 (twelve) hours. One drop in each eye two times a day      .  citalopram (CELEXA) 40 MG tablet TAKE 1 TABLET BY MOUTH ONCE A DAY. 30 tablet 3  . clotrimazole-betamethasone (LOTRISONE) cream APPLY TO AFFECTED AREAS TWICE DAILY. 45 g 0  . cyclobenzaprine (FLEXERIL) 10 MG tablet TAKE (1) TABLET BY MOUTH EVERY EIGHT HOURS AS NEEDED FOR MUSCLE SPASMS. 30 tablet 3  . furosemide (LASIX) 20 MG tablet Take 1 tablet (20 mg total) by mouth daily. 30 tablet 1  . gabapentin (NEURONTIN) 300 MG capsule TAKE ONE CAPSULE BY  MOUTH DAILY. 30 capsule 2  . HYDROcodone-acetaminophen (NORCO) 5-325 MG per tablet Take 1-2 tablets by mouth every 6 (six) hours as needed for moderate pain. 50 tablet 0  . ketoconazole (NIZORAL) 2 % shampoo APPLY TOPICALLY TWICE WEEKLY AS DIRECTED. 120 mL 1  . lamoTRIgine (LAMICTAL) 200 MG tablet TAKE 1 TABLET BY MOUTH ONCE A DAY. 30 tablet 4  . Liraglutide (VICTOZA) 18 MG/3ML SOPN Inject 1.8 mg into the skin at bedtime.    Marland Kitchen lisinopril (PRINIVIL,ZESTRIL) 5 MG tablet TAKE 1 TABLET BY MOUTH ONCE A DAY. 30 tablet 3  . NOVOFINE 30G X 8 MM MISC USE TWICE DAILY WITH BYETTA 100 each 3  . nystatin (MYCOSTATIN) powder APPLY TO AFFECTED AREA TWICE DAILY FOR 10 DAYS. 60 g 1  . nystatin-triamcinolone ointment (MYCOLOG) APPLY TO AFFECTED AREA TWICE DAILY. 60 g 2  . pantoprazole (PROTONIX) 40 MG tablet Take 40 mg by mouth 2 (two) times daily before a meal.    . potassium chloride SA (KLOR-CON M20) 20 MEQ tablet Take 1 tablet (20 mEq total) by mouth daily. 30 tablet 1  . simvastatin (ZOCOR) 40 MG tablet TAKE 1 TABLET BY MOUTH ONCE A DAY. 30 tablet 3  . Testosterone (ANDROGEL PUMP TD) Place 75 g onto the skin every morning.    . traMADol (ULTRAM) 50 MG tablet TAKE 1 TABLET BY MOUTH ONCE DAILY AS NEEDED FOR KNEE PAIN. 30 tablet 0  . triamcinolone ointment (KENALOG) 0.1 % APPLY TO AFFECTED AREAS TWICE DAILY. 30 g 0  . Vitamin D, Ergocalciferol, (DRISDOL) 50000 UNITS CAPS capsule TAKE ONE CAPSULE BY MOUTH ONCE WEEKLY. 12 capsule 0   No current facility-administered medications for this visit.    Allergies as of 04/17/2015  . (No Known Allergies)    Family History  Problem Relation Age of Onset  . Diabetes Mother   . Leukemia Mother   . Cancer Father     colon, age greater than 25?  Marland Kitchen Heart disease Father   . Glaucoma Father   . Parkinsonism Father     altzheimers , brain hemorrhage  . Colon cancer Neg Hx     Social History   Social History  . Marital Status: Divorced    Spouse Name: N/A  .  Number of Children: 0  . Years of Education: 12   Occupational History  . DISABLED   .     Social History Main Topics  . Smoking status: Former Smoker -- 1.50 packs/day for 25 years    Types: Cigarettes  . Smokeless tobacco: Former Systems developer    Quit date: 02/20/2002     Comment: Quit x 13 years  . Alcohol Use: No  . Drug Use: No  . Sexual Activity: Not Currently    Birth Control/ Protection: None   Other Topics Concern  . None   Social History Narrative    Review of Systems: General: Negative for anorexia, weight loss, fever, chills, fatigue, weakness. ENT: Negative for hoarseness, difficulty swallowing. Positive nasal congestion.  CV: Negative for chest pain, angina, palpitations, peripheral edema.  Respiratory: Negative for dyspnea at rest. Positive cough, sputum, wheezing.  GI: See history of present illness. MS: Negative for joint pain, low back pain.  Endo: Negative for unusual weight change.  Heme: Negative for bruising or bleeding.   Physical Exam: BP 135/66 mmHg  Pulse 64  Temp(Src) 98.5 F (36.9 C) (Oral)  Ht 6' (1.829 m)  Wt 444 lb 12.8 oz (201.76 kg)  BMI 60.31 kg/m2 General:   Morbidly obese. Alert and oriented. Pleasant and cooperative. Well-nourished and well-developed.  Head:  Normocephalic and atraumatic. Cardiovascular:  S1, S2 present without murmurs appreciated. Extremities without clubbing or edema. Respiratory:  Mild bilateral wheezes. No rales, or rhonchi. No distress.  Gastrointestinal:  +BS, morbidly obese, round, but soft, non-tender and non-distended. No HSM noted. No guarding or rebound. No masses appreciated. Small healed surgical scars noted to anterior abdomen. Rectal:  Deferred  Psych:  Alert and cooperative. Normal mood and affect.    04/17/2015 11:24 AM

## 2015-04-17 NOTE — Assessment & Plan Note (Signed)
GERD symptoms well controlled on twice a day PPI. Last endoscopy 2015. Continue PPI regimen as scheduled. Return for follow-up in one year or sooner if needed for any worsening symptoms.

## 2015-04-17 NOTE — Patient Instructions (Signed)
1. Continue Protonix twice daily. 2. Return for follow-up in one year. 3. Call us if you're having any recurrent or worsening symptoms and we can work you in sooner.

## 2015-04-18 ENCOUNTER — Encounter: Payer: Self-pay | Admitting: Family Medicine

## 2015-04-18 ENCOUNTER — Telehealth: Payer: Self-pay | Admitting: *Deleted

## 2015-04-18 MED ORDER — BENZONATATE 100 MG PO CAPS: 100.0000 mg | ORAL_CAPSULE | Freq: Two times a day (BID) | ORAL | Status: DC

## 2015-04-18 MED ORDER — AZELASTINE HCL 0.1 % NA SOLN: 2.0000 | Freq: Two times a day (BID) | NASAL | Status: DC

## 2015-04-18 NOTE — Telephone Encounter (Signed)
Spoke with patient and he has had sinus pressure and cough x 3 days.  No fever/chills.  Sputum is a light yellow color.  Please advise.  Will get patient to schedule a routine followup when speaking back to him.

## 2015-04-18 NOTE — Telephone Encounter (Signed)
Patient called stating he has had a bad head cold for 3 days, patient is requesting a antibiotic for it. Please advise [phone]

## 2015-04-18 NOTE — Telephone Encounter (Signed)
pls send tesslon perkles 100 mg twice daily #20 and  astellin 2 puffs per nostril twice daily, recommend oTC sudafed one twice daily for 3 days also

## 2015-04-18 NOTE — Telephone Encounter (Signed)
Patient aware.

## 2015-04-18 NOTE — Addendum Note (Signed)
Addended by: Denman George B on: 04/18/2015 10:56 AM   Modules accepted: Orders

## 2015-04-18 NOTE — Telephone Encounter (Signed)
See next telephone message. 

## 2015-04-18 NOTE — Telephone Encounter (Signed)
Medications sent to pharmacy.  Unable to get patient on phone due to phone problems.  Will try again.

## 2015-04-26 ENCOUNTER — Ambulatory Visit (INDEPENDENT_AMBULATORY_CARE_PROVIDER_SITE_OTHER): Payer: PPO | Admitting: Urology

## 2015-04-26 ENCOUNTER — Other Ambulatory Visit: Payer: Self-pay | Admitting: Family Medicine

## 2015-04-26 DIAGNOSIS — E291 Testicular hypofunction: Secondary | ICD-10-CM | POA: Diagnosis not present

## 2015-05-07 ENCOUNTER — Encounter: Payer: Self-pay | Admitting: Podiatry

## 2015-05-07 ENCOUNTER — Ambulatory Visit (INDEPENDENT_AMBULATORY_CARE_PROVIDER_SITE_OTHER): Payer: PPO | Admitting: Podiatry

## 2015-05-07 DIAGNOSIS — B351 Tinea unguium: Secondary | ICD-10-CM

## 2015-05-09 ENCOUNTER — Ambulatory Visit: Payer: PPO | Admitting: Podiatry

## 2015-05-10 ENCOUNTER — Telehealth: Payer: Self-pay | Admitting: *Deleted

## 2015-05-10 NOTE — Telephone Encounter (Signed)
Entered in error

## 2015-05-16 ENCOUNTER — Other Ambulatory Visit: Payer: Self-pay | Admitting: Family Medicine

## 2015-05-16 ENCOUNTER — Other Ambulatory Visit: Payer: Self-pay | Admitting: "Endocrinology

## 2015-05-27 ENCOUNTER — Other Ambulatory Visit: Payer: Self-pay | Admitting: Family Medicine

## 2015-05-28 ENCOUNTER — Telehealth: Payer: Self-pay | Admitting: Family Medicine

## 2015-05-28 NOTE — Telephone Encounter (Signed)
Patient has pulled a muscle in his back and cant move around, hes asking for something so he can get here to the office for an appointment please advise?

## 2015-05-29 ENCOUNTER — Other Ambulatory Visit: Payer: Self-pay

## 2015-05-29 MED ORDER — TRAMADOL HCL 50 MG PO TABS: ORAL_TABLET | ORAL | Status: DC

## 2015-05-29 MED ORDER — LAMOTRIGINE 200 MG PO TABS: 200.0000 mg | ORAL_TABLET | Freq: Every day | ORAL | Status: DC

## 2015-05-29 MED ORDER — CYCLOBENZAPRINE HCL 10 MG PO TABS: ORAL_TABLET | ORAL | Status: DC

## 2015-05-29 NOTE — Telephone Encounter (Signed)
pls refill the flexeril sent in 2015, and let him know

## 2015-05-29 NOTE — Telephone Encounter (Signed)
Medication refilled and patient aware. 

## 2015-05-29 NOTE — Telephone Encounter (Signed)
Patient states that he was lifting furniture and pulled something in his back.   He is asking for medication and was given appointment for next week.  Please advise.

## 2015-06-05 ENCOUNTER — Ambulatory Visit (INDEPENDENT_AMBULATORY_CARE_PROVIDER_SITE_OTHER): Payer: PPO | Admitting: Family Medicine

## 2015-06-05 ENCOUNTER — Encounter: Payer: Self-pay | Admitting: Family Medicine

## 2015-06-05 VITALS — BP 170/80 | HR 97 | Resp 16 | Ht 72.0 in | Wt >= 6400 oz

## 2015-06-05 DIAGNOSIS — E1121 Type 2 diabetes mellitus with diabetic nephropathy: Secondary | ICD-10-CM | POA: Diagnosis not present

## 2015-06-05 DIAGNOSIS — M549 Dorsalgia, unspecified: Secondary | ICD-10-CM

## 2015-06-05 DIAGNOSIS — I10 Essential (primary) hypertension: Secondary | ICD-10-CM | POA: Diagnosis not present

## 2015-06-05 DIAGNOSIS — E291 Testicular hypofunction: Secondary | ICD-10-CM

## 2015-06-05 DIAGNOSIS — M5410 Radiculopathy, site unspecified: Secondary | ICD-10-CM | POA: Insufficient documentation

## 2015-06-05 DIAGNOSIS — E785 Hyperlipidemia, unspecified: Secondary | ICD-10-CM

## 2015-06-05 MED ORDER — GABAPENTIN 300 MG PO CAPS: 300.0000 mg | ORAL_CAPSULE | Freq: Every day | ORAL | Status: DC

## 2015-06-05 MED ORDER — KETOROLAC TROMETHAMINE 60 MG/2ML IM SOLN
60.0000 mg | Freq: Once | INTRAMUSCULAR | Status: AC
  Administered 2015-06-05: 60 mg via INTRAMUSCULAR

## 2015-06-05 MED ORDER — CYCLOBENZAPRINE HCL 10 MG PO TABS: ORAL_TABLET | ORAL | Status: DC

## 2015-06-05 MED ORDER — IBUPROFEN 800 MG PO TABS: 800.0000 mg | ORAL_TABLET | Freq: Two times a day (BID) | ORAL | Status: DC

## 2015-06-05 MED ORDER — PREDNISONE 5 MG (21) PO TBPK: ORAL_TABLET | ORAL | Status: DC

## 2015-06-05 MED ORDER — HYDROCODONE-ACETAMINOPHEN 7.5-325 MG PO TABS: ORAL_TABLET | ORAL | Status: DC

## 2015-06-05 MED ORDER — METHYLPREDNISOLONE ACETATE 80 MG/ML IJ SUSP
80.0000 mg | Freq: Once | INTRAMUSCULAR | Status: AC
  Administered 2015-06-05: 80 mg via INTRAMUSCULAR

## 2015-06-05 NOTE — Patient Instructions (Signed)
F/u in 4 month, call if you need me sooner  Nurse bP check in 5 weeks, submit microalb at visit  You have acute sciatic pain,. NO MORE MOVING FURNITURE  Injections in office today, toradol and depo medrol Ibuprofen, prednisone and hydrocodone prescribed short term  Use flexeril and gabapentin regualrly for chronic back pain and spasm  If not much improved next week please call

## 2015-06-05 NOTE — Progress Notes (Signed)
Subjective:    Patient ID: Nicholas [name], male    DOB: February 14, 1956, 59 y.o.   MRN: [id number]  HPI 3 week h/o increased and uncontrolled low back pain radiating to left lower extremity, rated at a 10, started when he tried moving a couch. Limiting mobility Blood sugars remain uncontrolled and elevated,denies hypoglycemic episodes , polyuria  Or polydipsia Follows regularly with urology, and since last visit , he had a cholecystectyomy  Review of Systems See HPI Denies recent fever or chills. Denies sinus pressure, nasal congestion, ear pain or sore throat. Denies chest congestion, productive cough or wheezing. Denies chest pains, palpitations and leg swelling Denies abdominal pain, nausea, vomiting,diarrhea or constipation.   Denies dysuria, frequency, hesitancy or incontinence Denies headaches, seizures, does have lower extremity numbness, and tingling. Denies depression, anxiety or insomnia. Denies skin break down or rash.        Objective:   Physical Exam  BP 170/80 mmHg  Pulse 97  Resp 16  Ht 6' (1.829 m)  Wt 436 lb (197.768 kg)  BMI 59.12 kg/m2  SpO2 94% Patient alert and oriented and in no cardiopulmonary distress. Pt in pain with markedly limited mobility HEENT: No facial asymmetry, EOMI,   oropharynx pink and moist.  Neck supple no JVD, no mass.  Chest: Clear to auscultation bilaterally.  CVS: S1, S2 no murmurs, no S3.Regular rate.  ABD: Soft non tender.   Ext: No edema  MS: decreased  ROM spine, left s, hip and knee.  Skin: Intact, no ulcerations or rash noted.  Psych: Good eye contact, normal affect. Memory intact not anxious or depressed appearing.  CNS: CN 2-12 intact, ed.       Assessment & Plan:  Back pain with left-sided radiculopathy 3 week history ,  Started wheen moving a couch  Back pain with radiation 3 week history after moving furniture, radiates to left lower extremity, rated at a 10 Uncontrolled.Toradol and depo medrol  administered IM in the office , to be followed by a short course of oral prednisone and NSAIDS. Short term hydrocodone  Due to severity of pain, has not responded to tramadol, also long term gabapentin  Benign hypertension Uncontrolled at visit , however pt in severe pain, nurse BP re check in 5 weeks, no med changes  Type 2 diabetes mellitus with diabetic nephropathy Nicholas [name] is reminded of the importance of commitment to daily physical activity for 30 minutes or more, as able and the need to limit carbohydrate intake to 30 to 60 grams per meal to help with blood sugar control.   The need to take medication as prescribed, test blood sugar as directed, and to call between visits if there is a concern that blood sugar is uncontrolled is also discussed.   Nicholas [name] is reminded of the importance of daily foot exam, annual eye examination, and good blood sugar, blood pressure and cholesterol control. Improved but uncontrolled, treated by endo Diabetic Labs Latest Ref Rng 03/08/2015 02/26/2015 02/21/2015 02/13/2015 01/25/2015  HbA1c 4.0 - 6.0 % 9.0(A) - - 9.6(H) -  Microalbumin <2.0 mg/dL - - - - -  Micro/Creat Ratio 0.0 - 30.0 mg/g - - - - -  Chol 125 - 200 mg/dL - - - 168 -  HDL >=40 mg/dL - - - 41 -  Calc LDL <130 mg/dL - - - 80 -  Triglycerides <150 mg/dL - - - 236(H) -  Creatinine 0.61 - 1.24 mg/dL - 1.15 0.96 1.07 1.00   BP/Weight  06/05/2015 04/17/2015 04/10/2015 02/26/2015 02/25/2015 02/21/2015 [id number]  Systolic BP 123XX123 [id number] 0000000 0000000 - 0000000 [id number]  Diastolic BP 80 66 85 66 - 61 67  Wt. (Lbs) 436 444.8 447 469.58 - 454 454.4  BMI 59.12 60.31 64.14 - 63.67 61.56 67.07   Foot/eye exam completion dates Latest Ref Rng 03/11/2015 07/09/2014  Eye Exam No Retinopathy No Retinopathy -  Foot exam Order - - -  Foot Form Completion - - Done         Hypogonadism in male Treated by urology and improved  Hyperlipidemia LDL goal <100 Uncontrolled with elevated triglycerides Hyperlipidemia:Low  fat diet discussed and encouraged.   Lipid Panel  Lab Results  Component Value Date   CHOL 168 02/13/2015   HDL 41 02/13/2015   LDLCALC 80 02/13/2015   TRIG 236* 02/13/2015   CHOLHDL 4.1 02/13/2015      No med change

## 2015-06-05 NOTE — Assessment & Plan Note (Signed)
3 week history ,  Started wheen moving a couch

## 2015-06-06 ENCOUNTER — Other Ambulatory Visit: Payer: Self-pay | Admitting: Family Medicine

## 2015-06-09 NOTE — Assessment & Plan Note (Signed)
Unchnaged Patient re-educated about  the importance of commitment to a  minimum of 150 minutes of exercise per week.  The importance of healthy food choices with portion control discussed. Encouraged to start a food diary, count calories and to consider  joining a support group. Sample diet sheets offered. Goals set by the patient for the next several months.   Weight /BMI 06/05/2015 04/17/2015 04/10/2015  WEIGHT 436 lb 444 lb 12.8 oz 447 lb  HEIGHT 6\' 0"  6\' 0"  5\' 10"   BMI 59.12 kg/m2 60.31 kg/m2 64.14 kg/m2    Current exercise per week 45 minutes.

## 2015-06-09 NOTE — Assessment & Plan Note (Signed)
3 week history after moving furniture, radiates to left lower extremity, rated at a 10 Uncontrolled.Toradol and depo medrol administered IM in the office , to be followed by a short course of oral prednisone and NSAIDS. Short term hydrocodone  Due to severity of pain, has not responded to tramadol, also long term gabapentin

## 2015-06-09 NOTE — Assessment & Plan Note (Signed)
Uncontrolled with elevated triglycerides Hyperlipidemia:Low fat diet discussed and encouraged.   Lipid Panel  Lab Results  Component Value Date   CHOL 168 02/13/2015   HDL 41 02/13/2015   LDLCALC 80 02/13/2015   TRIG 236* 02/13/2015   CHOLHDL 4.1 02/13/2015      No med change

## 2015-06-09 NOTE — Assessment & Plan Note (Signed)
Uncontrolled at visit , however pt in severe pain, nurse BP re check in 5 weeks, no med changes

## 2015-06-09 NOTE — Assessment & Plan Note (Signed)
Treated by urology and improved

## 2015-06-09 NOTE — Assessment & Plan Note (Signed)
Nicholas [name] is reminded of the importance of commitment to daily physical activity for 30 minutes or more, as able and the need to limit carbohydrate intake to 30 to 60 grams per meal to help with blood sugar control.   The need to take medication as prescribed, test blood sugar as directed, and to call between visits if there is a concern that blood sugar is uncontrolled is also discussed.   Nicholas [name] is reminded of the importance of daily foot exam, annual eye examination, and good blood sugar, blood pressure and cholesterol control. Improved but uncontrolled, treated by endo Diabetic Labs Latest Ref Rng 03/08/2015 02/26/2015 02/21/2015 02/13/2015 01/25/2015  HbA1c 4.0 - 6.0 % 9.0(A) - - 9.6(H) -  Microalbumin <2.0 mg/dL - - - - -  Micro/Creat Ratio 0.0 - 30.0 mg/g - - - - -  Chol 125 - 200 mg/dL - - - 168 -  HDL >=40 mg/dL - - - 41 -  Calc LDL <130 mg/dL - - - 80 -  Triglycerides <150 mg/dL - - - 236(H) -  Creatinine 0.61 - 1.24 mg/dL - 1.15 0.96 1.07 1.00   BP/Weight 06/05/2015 04/17/2015 04/10/2015 02/26/2015 02/25/2015 02/21/2015 [id number]  Systolic BP 123XX123 A999333 0000000 0000000 - 0000000 [id number]  Diastolic BP 80 66 85 66 - 61 67  Wt. (Lbs) 436 444.8 447 469.58 - 454 454.4  BMI 59.12 60.31 64.14 - 63.67 61.56 67.07   Foot/eye exam completion dates Latest Ref Rng 03/11/2015 07/09/2014  Eye Exam No Retinopathy No Retinopathy -  Foot exam Order - - -  Foot Form Completion - - Done

## 2015-06-11 ENCOUNTER — Ambulatory Visit: Payer: PPO | Admitting: Podiatry

## 2015-06-12 ENCOUNTER — Other Ambulatory Visit: Payer: Self-pay | Admitting: "Endocrinology

## 2015-06-14 ENCOUNTER — Other Ambulatory Visit: Payer: Self-pay | Admitting: Family Medicine

## 2015-06-17 ENCOUNTER — Telehealth: Payer: Self-pay | Admitting: Family Medicine

## 2015-06-17 DIAGNOSIS — M5410 Radiculopathy, site unspecified: Secondary | ICD-10-CM

## 2015-06-17 MED ORDER — HYDROCODONE-ACETAMINOPHEN 7.5-325 MG PO TABS: ORAL_TABLET | ORAL | Status: DC

## 2015-06-17 NOTE — Telephone Encounter (Signed)
asap ortho eval to Doc of choice , print dose increased to 3 tabs daily , needs to not exceed that and 10 day supply only, I will sign for the thirty additional tabs [facility] office is in [place], locally Dr Aline Brochure or Luna Glasgow

## 2015-06-17 NOTE — Telephone Encounter (Signed)
Patient aware and will collect script tomorrow

## 2015-06-17 NOTE — Addendum Note (Signed)
Addended by: Eual Fines on: 06/17/2015 03:00 PM   Modules accepted: Orders

## 2015-06-17 NOTE — Telephone Encounter (Signed)
Is still having severe left hip and throbbing left leg pain and is out of the hydrocodone. Has been taking 2-4 per day to be able to walk. The gabapentin and tramadol isn't helping on its own. Wants to know what the next step is and is he can have a few more pain pills prescribed. Please advise. Rates pain at a 9

## 2015-06-17 NOTE — Telephone Encounter (Signed)
Asking to speak to Dr. Camillia Herter nurse

## 2015-06-24 ENCOUNTER — Other Ambulatory Visit: Payer: Self-pay | Admitting: Family Medicine

## 2015-06-25 ENCOUNTER — Other Ambulatory Visit: Payer: Self-pay | Admitting: Orthopedic Surgery

## 2015-06-25 ENCOUNTER — Ambulatory Visit (HOSPITAL_COMMUNITY)
Admission: RE | Admit: 2015-06-25 | Discharge: 2015-06-25 | Disposition: A | Discharge status: Home / Self Care | Payer: PPO | Source: Ambulatory Visit | Attending: Orthopedic Surgery | Admitting: Orthopedic Surgery

## 2015-06-25 ENCOUNTER — Other Ambulatory Visit: Payer: Self-pay | Admitting: *Deleted

## 2015-06-25 ENCOUNTER — Telehealth: Payer: Self-pay | Admitting: Family Medicine

## 2015-06-25 DIAGNOSIS — M4686 Other specified inflammatory spondylopathies, lumbar region: Secondary | ICD-10-CM | POA: Insufficient documentation

## 2015-06-25 DIAGNOSIS — M545 Low back pain: Secondary | ICD-10-CM | POA: Diagnosis not present

## 2015-06-25 DIAGNOSIS — M419 Scoliosis, unspecified: Secondary | ICD-10-CM | POA: Diagnosis not present

## 2015-06-25 DIAGNOSIS — M5410 Radiculopathy, site unspecified: Secondary | ICD-10-CM

## 2015-06-25 NOTE — Telephone Encounter (Signed)
Patient states that pain medication that was given for his back and legs are not helping at all, please advise?

## 2015-06-26 NOTE — Telephone Encounter (Signed)
States he doesn't go to the ortho until Jan 5th and he is out of pain medication and is in severe pain. He had to go out to have his xray and he was almost in tears from the pain. Wants to know if he can have anything else to last until his appt

## 2015-06-27 ENCOUNTER — Other Ambulatory Visit: Payer: Self-pay

## 2015-06-27 DIAGNOSIS — M5410 Radiculopathy, site unspecified: Secondary | ICD-10-CM

## 2015-06-27 MED ORDER — HYDROCODONE-ACETAMINOPHEN 7.5-325 MG PO TABS: ORAL_TABLET | ORAL | Status: DC

## 2015-06-27 NOTE — Addendum Note (Signed)
Addended by: Eual Fines on: 06/27/2015 01:22 PM   Modules accepted: Orders

## 2015-06-27 NOTE — Telephone Encounter (Signed)
Patient aware and referral entered   

## 2015-06-27 NOTE — Telephone Encounter (Signed)
pls print one mobnth supply of the hydrocodone, same dose , have him collect script , I will siggn  Ensure he has pain contract on record and up to date also.  I recommend pain mangement through Dr Lyla Son as he may require long term expert help for the back pain, sched 4 week f/u in office also please

## 2015-07-02 ENCOUNTER — Other Ambulatory Visit: Payer: Self-pay | Admitting: "Endocrinology

## 2015-07-02 DIAGNOSIS — E11628 Type 2 diabetes mellitus with other skin complications: Secondary | ICD-10-CM | POA: Diagnosis not present

## 2015-07-02 DIAGNOSIS — L84 Corns and callosities: Secondary | ICD-10-CM | POA: Diagnosis not present

## 2015-07-02 LAB — BASIC METABOLIC PANEL
BUN: 19 mg/dL (ref 7–25)
CHLORIDE: 99 mmol/L (ref 98–110)
CO2: 29 mmol/L (ref 20–31)
Calcium: 9 mg/dL (ref 8.6–10.3)
Creat: 1.01 mg/dL (ref 0.70–1.25)
GLUCOSE: 161 mg/dL — AB (ref 65–99)
POTASSIUM: 5 mmol/L (ref 3.5–5.3)
Sodium: 140 mmol/L (ref 135–146)

## 2015-07-03 LAB — HEMOGLOBIN A1C
Hgb A1c MFr Bld: 8 % — ABNORMAL HIGH (ref <5.7)
Mean Plasma Glucose: 183 mg/dL — ABNORMAL HIGH (ref <117)

## 2015-07-04 ENCOUNTER — Ambulatory Visit: Payer: PPO | Admitting: Orthopedic Surgery

## 2015-07-09 ENCOUNTER — Other Ambulatory Visit: Payer: Self-pay | Admitting: Family Medicine

## 2015-07-09 ENCOUNTER — Telehealth: Payer: Self-pay | Admitting: Family Medicine

## 2015-07-09 NOTE — Telephone Encounter (Signed)
Nicholas [name] is calling stating that he is in a lot of pain, he is scheduled to see Dr Francesco Runner on Jan 24th but he wants relief today, please advise?

## 2015-07-10 MED ORDER — PREDNISONE 5 MG (21) PO TBPK: ORAL_TABLET | ORAL | Status: DC

## 2015-07-10 MED ORDER — IBUPROFEN 800 MG PO TABS: 800.0000 mg | ORAL_TABLET | Freq: Two times a day (BID) | ORAL | Status: DC | PRN

## 2015-07-10 MED ORDER — CYCLOBENZAPRINE HCL 10 MG PO TABS: ORAL_TABLET | ORAL | Status: DC

## 2015-07-10 NOTE — Telephone Encounter (Signed)
Already 2 active refills on flexeril at night.re order pls  Doubt tramadol will help much, I would recommend instead ibuprofen 800 mg twice daily for 1 weeks and prednisone 5 mg dose pack #21, pls send if he agrees

## 2015-07-10 NOTE — Addendum Note (Signed)
Addended by: Eual Fines on: 07/10/2015 03:59 PM   Modules accepted: Orders, Medications

## 2015-07-10 NOTE — Telephone Encounter (Signed)
Patient states that he is still in a lot of pain.  Taking 2 Hydrocodone at one time does help the pain slightly.  He is asking for a refill of flexeril and tramadol?  Also is he still to see Dr. Aline Brochure?  I think this referral may have been cancelled.

## 2015-07-10 NOTE — Telephone Encounter (Signed)
Med sent and pt aware °

## 2015-07-11 ENCOUNTER — Encounter: Payer: Self-pay | Admitting: "Endocrinology

## 2015-07-11 ENCOUNTER — Ambulatory Visit (INDEPENDENT_AMBULATORY_CARE_PROVIDER_SITE_OTHER): Payer: PPO | Admitting: "Endocrinology

## 2015-07-11 VITALS — BP 126/75 | HR 75 | Ht 72.0 in | Wt >= 6400 oz

## 2015-07-11 DIAGNOSIS — E785 Hyperlipidemia, unspecified: Secondary | ICD-10-CM | POA: Diagnosis not present

## 2015-07-11 DIAGNOSIS — E11628 Type 2 diabetes mellitus with other skin complications: Secondary | ICD-10-CM

## 2015-07-11 DIAGNOSIS — L84 Corns and callosities: Secondary | ICD-10-CM | POA: Diagnosis not present

## 2015-07-11 DIAGNOSIS — I10 Essential (primary) hypertension: Secondary | ICD-10-CM | POA: Diagnosis not present

## 2015-07-11 NOTE — Patient Instructions (Signed)

## 2015-07-11 NOTE — Progress Notes (Signed)
Subjective:    Patient ID: Nicholas [name], male    DOB: 1956-04-02,    Past Medical History  Diagnosis Date  . Diabetes ([place])   . Hyperlipidemia   . Testosterone deficiency   . Hypertension   . Poor circulation     leg   . MRSA (methicillin resistant Staphylococcus aureus)   . Chronic knee pain   . Depression   . Glaucoma   . Suicide attempt [facility])    Past Surgical History  Procedure Laterality Date  . Kidney stones      cysto with litholapexy  . Colonoscopy  03/2008    [id number] external hemorrhoidal tag, otherwise normal rectum/Two diminutive rectosigmoid polyps s/p bx/ Polyp in the opposite the ileocecal valve s/p bx. tubular adenoma.  . Colonoscopy N/A 03/23/2014    [id number] colon polyps throughout his rectum and colon. Sessile and pedunculated. The largest polyp was proximally 6 mm in dimensions in the retum at 3 cm with adjacent diminutive polyp.  There was also a polyps on ileocecal valve, ascending segmen descending and sigmoid segment. as the colon was tortuous & elongated requiring externa abd pressure and changing of the pts postion to reach cecum  . Esophagogastroduodenoscopy N/A 03/23/2014    RMR: 1. Patient had circumferential distal esophageal erosions within 5 mm of the GE junction.  No Barrett's esophagus. Tubular esophagus patent throughout its course. 2.  Diffuse gastric erosions. (1) 11mm area of healing ulceration in the antrum. No ulcer or infiltrating process observed. Patent pylorus. Normal-appearing first and second portion of the duodenum .  Marland Kitchen Cholecystectomy N/A 02/25/2015    Procedure: LAPAROSCOPIC CHOLECYSTECTOMY;  Surgeon: Aviva Signs, MD;  Location: [facility];  Service: General;  Laterality: N/A;   Social History   Social History  . Marital Status: Divorced    Spouse Name: N/A  . Number of Children: 0  . Years of Education: 12   Occupational History  . DISABLED   .     Social History Main Topics  . Smoking status: Former Smoker -- 1.50  packs/day for 25 years    Types: Cigarettes  . Smokeless tobacco: Former Systems developer    Quit date: 02/20/2002     Comment: Quit x 13 years  . Alcohol Use: No  . Drug Use: No  . Sexual Activity: Not Currently    Birth Control/ Protection: None   Other Topics Concern  . None   Social History Narrative   Outpatient Encounter Prescriptions as of 07/11/2015  Medication Sig  . ACCU-CHEK AVIVA PLUS test strip TEST FOUR TIMES A DAY AS DIRECTED.  Marland Kitchen aspirin (ASPIRIN LOW DOSE) 81 MG EC tablet Take 81 mg by mouth daily. Take one tablet by mouth once a day   . azelastine (ASTELIN) 0.1 % nasal spray Place 2 sprays into both nostrils 2 (two) times daily.  . B-D INS SYR ULTRAFINE 1CC/31G 31G X 5/16" 1 ML MISC USE AS DIRECTED WITH INSULIN VIALS TWICE DAILY.  Marland Kitchen B-D INS SYRINGE 0.5CC/31GX5/16 31G X 5/16" 0.5 ML MISC USE TO INJECT THREE TIMES DAILY.  . brimonidine (ALPHAGAN P) 0.1 % SOLN Place 1 drop into both eyes 2 (two) times daily. One drop in each eye two times a day   . brimonidine-timolol (COMBIGAN) 0.2-0.5 % ophthalmic solution Place 1 drop into both eyes every 12 (twelve) hours. One drop in each eye two times a day    . citalopram (CELEXA) 40 MG tablet TAKE 1 TABLET BY MOUTH ONCE A DAY.  Marland Kitchen  clotrimazole-betamethasone (LOTRISONE) cream APPLY TO AFFECTED AREAS TWICE DAILY.  . cyclobenzaprine (FLEXERIL) 10 MG tablet TAKE (1) TABLET BY MOUTH EVERY EIGHT HOURS AS NEEDED FOR MUSCLE SPASMS.  . furosemide (LASIX) 20 MG tablet Take 1 tablet (20 mg total) by mouth daily.  Marland Kitchen gabapentin (NEURONTIN) 300 MG capsule Take 1 capsule (300 mg total) by mouth daily.  Marland Kitchen HUMULIN R U-500 KWIKPEN 500 UNIT/ML kwikpen INJECT UP TO 100 UNITS SUBCUTANEOUSLY 30 MINUTES BEFORE MEALS.  . HYDROcodone-acetaminophen (NORCO) 7.5-325 MG tablet One tablet three times daily as needed , for uncontrolled back pain  . ibuprofen (ADVIL,MOTRIN) 800 MG tablet Take 1 tablet (800 mg total) by mouth 2 (two) times daily.  Marland Kitchen ibuprofen (ADVIL,MOTRIN)  800 MG tablet Take 1 tablet (800 mg total) by mouth 2 (two) times daily as needed.  Marland Kitchen ketoconazole (NIZORAL) 2 % shampoo APPLY TOPICALLY TWICE WEEKLY AS DIRECTED.  Marland Kitchen lamoTRIgine (LAMICTAL) 200 MG tablet TAKE 1 TABLET BY MOUTH ONCE A DAY.  Marland Kitchen lisinopril (PRINIVIL,ZESTRIL) 5 MG tablet TAKE 1 TABLET BY MOUTH ONCE A DAY.  Marland Kitchen NOVOFINE 30G X 8 MM MISC USE TWICE DAILY WITH BYETTA  . nystatin (MYCOSTATIN) powder APPLY TO AFFECTED AREA TWICE DAILY FOR 10 DAYS.  Marland Kitchen nystatin-triamcinolone ointment (MYCOLOG) APPLY TO AFFECTED AREA TWICE DAILY.  . pantoprazole (PROTONIX) 40 MG tablet Take 40 mg by mouth 2 (two) times daily before a meal.  . potassium chloride SA (KLOR-CON M20) 20 MEQ tablet Take 1 tablet (20 mEq total) by mouth daily.  . predniSONE (STERAPRED UNI-PAK 21 TAB) 5 MG (21) TBPK tablet Use as directed  . simvastatin (ZOCOR) 40 MG tablet TAKE 1 TABLET BY MOUTH ONCE A DAY.  Marland Kitchen Testosterone (ANDROGEL PUMP TD) Place 75 g onto the skin every morning.  . traMADol (ULTRAM) 50 MG tablet TAKE 1 TABLET BY MOUTH ONCE DAILY AS NEEDED FOR KNEE PAIN.  Marland Kitchen triamcinolone ointment (KENALOG) 0.1 % APPLY TO AFFECTED AREAS TWICE DAILY.  Marland Kitchen VICTOZA 18 MG/3ML SOPN INJECT 1.8 MG SUBCUTANEOUSLY ONCE DAILY. (Patient not taking: Reported on 06/05/2015)  . Vitamin D, Ergocalciferol, (DRISDOL) 50000 UNITS CAPS capsule TAKE ONE CAPSULE BY MOUTH ONCE WEEKLY. (Patient not taking: Reported on 06/05/2015)   No facility-administered encounter medications on file as of 07/11/2015.   ALLERGIES: No Known Allergies VACCINATION STATUS: Immunization History  Administered Date(s) Administered  . Influenza Whole 04/01/2010  . Influenza,inj,Quad PF,36+ Mos 03/08/2013  . Pneumococcal Conjugate-13 07/09/2014  . Pneumococcal Polysaccharide-23 04/01/2010  . Tdap 11/24/2011  . Zoster 12/12/2014    Diabetes He presents for his follow-up diabetic visit. He has type 2 diabetes mellitus. His disease course has been improving. There are no  hypoglycemic associated symptoms. Pertinent negatives for hypoglycemia include no confusion, headaches, pallor or seizures. There are no diabetic associated symptoms. Pertinent negatives for diabetes include no chest pain, no fatigue, no polydipsia, no polyphagia, no polyuria and no weakness. There are no hypoglycemic complications. Symptoms are improving. There are no diabetic complications. He is compliant with treatment most of the time. His weight is stable. He is following a generally unhealthy diet. He rarely participates in exercise. His home blood glucose trend is decreasing steadily. His breakfast blood glucose range is generally 180-200 mg/dl. His lunch blood glucose range is generally 180-200 mg/dl. His dinner blood glucose range is generally 180-200 mg/dl. His overall blood glucose range is 180-200 mg/dl. An ACE inhibitor/angiotensin II receptor blocker is being taken.  Hyperlipidemia This is a chronic problem. The current episode started more than 1  year ago. Exacerbating diseases include diabetes and obesity. Pertinent negatives include no chest pain, myalgias or shortness of breath. Current antihyperlipidemic treatment includes statins. Risk factors for coronary artery disease include a sedentary lifestyle, male sex, diabetes mellitus, dyslipidemia and obesity.     Review of Systems  Constitutional: Negative for fatigue and unexpected weight change.  HENT: Negative for dental problem, mouth sores and trouble swallowing.   Eyes: Negative for visual disturbance.  Respiratory: Negative for cough, choking, chest tightness, shortness of breath and wheezing.   Cardiovascular: Negative for chest pain, palpitations and leg swelling.  Gastrointestinal: Negative for nausea, vomiting, abdominal pain, diarrhea, constipation and abdominal distention.  Endocrine: Negative for polydipsia, polyphagia and polyuria.  Genitourinary: Negative for dysuria, urgency, hematuria and flank pain.   Musculoskeletal: Negative for myalgias, back pain, gait problem and neck pain.  Skin: Negative for pallor, rash and wound.  Neurological: Negative for seizures, syncope, weakness, numbness and headaches.  Psychiatric/Behavioral: Negative.  Negative for confusion and dysphoric mood.    Objective:    BP 126/75 mmHg  Pulse 75  Ht 6' (1.829 m)  Wt 433 lb (196.408 kg)  BMI 58.71 kg/m2  SpO2 98%  Wt Readings from Last 3 Encounters:  07/11/15 433 lb (196.408 kg)  06/05/15 436 lb (197.768 kg)  04/17/15 444 lb 12.8 oz (201.76 kg)    Physical Exam  Constitutional: He is oriented to person, place, and time. He appears well-developed and well-nourished. He is cooperative. No distress.  HENT:  Head: Normocephalic and atraumatic.  Eyes: EOM are normal.  Neck: Normal range of motion. Neck supple. No tracheal deviation present. No thyromegaly present.  Cardiovascular: Normal rate, S1 normal, S2 normal and normal heart sounds.  Exam reveals no gallop.   No murmur heard. Pulses:      Dorsalis pedis pulses are 1+ on the right side, and 1+ on the left side.       Posterior tibial pulses are 1+ on the right side, and 1+ on the left side.  Pulmonary/Chest: Breath sounds normal. No respiratory distress. He has no wheezes.  Abdominal: Soft. Bowel sounds are normal. He exhibits no distension. There is no tenderness. There is no guarding and no CVA tenderness.  Musculoskeletal: He exhibits no edema.       Right shoulder: He exhibits no swelling and no deformity.  Neurological: He is alert and oriented to person, place, and time. He has normal strength and normal reflexes. No cranial nerve deficit or sensory deficit. Gait normal.  Skin: Skin is warm and dry. No rash noted. No cyanosis. Nails show no clubbing.  He has extensive tattoos.  Psychiatric: He has a normal mood and affect. His speech is normal and behavior is normal. Judgment and thought content normal. Cognition and memory are normal.     Results for orders placed or performed in visit on [id number]  Basic metabolic panel  Result Value Ref Range   Sodium 140 135 - 146 mmol/L   Potassium 5.0 3.5 - 5.3 mmol/L   Chloride 99 98 - 110 mmol/L   CO2 29 20 - 31 mmol/L   Glucose, Bld 161 (H) 65 - 99 mg/dL   BUN 19 7 - 25 mg/dL   Creat 1.01 0.70 - 1.25 mg/dL   Calcium 9.0 8.6 - 10.3 mg/dL  Hemoglobin A1c  Result Value Ref Range   Hgb A1c MFr Bld 8.0 (H) <5.7 %   Mean Plasma Glucose 183 (H) <117 mg/dL  Diabetic Labs (most recent): Lab Results  Component Value Date   HGBA1C 8.0* 07/02/2015   HGBA1C 9.0* 03/08/2015   HGBA1C 9.6* 02/13/2015   Lipid Panel     Component Value Date/Time   CHOL 168 02/13/2015 0915   TRIG 236* 02/13/2015 0915   HDL 41 02/13/2015 0915   CHOLHDL 4.1 02/13/2015 0915   VLDL 47* 02/13/2015 0915   LDLCALC 80 02/13/2015 0915    Assessment & Plan:   1. 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  Patient came with Humulin U500 insulin pens for demonstration. His recent A1c of  8% improving from 9 %.  He is on U500 using 100 units TIDAC. - Patient remains at a high risk for more acute and chronic complications of diabetes which include CAD, CVA, CKD, retinopathy, and neuropathy. These are all discussed in detail with the patient.  - I have re-counseled the patient on diet management and weight loss  by adopting a carbohydrate restricted / protein rich  Diet. - Patient is advised to stick to a routine mealtimes to eat 3 meals  a day and avoid unnecessary snacks ( to snack only to correct hypoglycemia).  - Suggestion is made for patient to avoid simple carbohydrates   from their diet including Cakes , Desserts, Ice Cream,  Soda (  diet and regular) , Sweet Tea , Candies,  Chips, Cookies, Artificial Sweeteners,   and "Sugar-free" Products .  This will help patient to have stable blood glucose profile and potentially avoid  unintended  Weight gain.  - The patient  has been  scheduled with Jearld Fenton, RDN, CDE for individualized DM education. - I have approached patient with the following individualized plan to manage diabetes and patient agrees.  - I will proceed with Humulin U500 pen 100 units 3 times a day before meals for pre-meal blood glucose above 90 mg/dL associated with strict monitoring of glucose  AC and HS. - Patient is warned not to take insulin without proper monitoring per orders.  -Patient is encouraged to call clinic for blood glucose levels less than 70 or above 300 mg /dl. - I will continue Victoza 1.8 mg subcutaneous daily, therapeutically suitable for patient.. -Patient does not tolerate metformin.   - Patient specific target  for A1c; LDL, HDL, Triglycerides, and  Waist Circumference were discussed in detail.  2) BP/HTN: Controlled. Continue current medications including ACEI. 3) Lipids/HPL:  continue statins. 4)  Weight/Diet: CDE consult in progress, exercise, and carbohydrates information provided.  5) Chronic Care/Health Maintenance:  -Patient is on ACEI and Statin medications and encouraged to continue to follow up with Ophthalmology, Podiatrist at least yearly or according to recommendations, and advised to  stay away from smoking. I have recommended yearly flu vaccine and pneumonia vaccination at least every 5 years; moderate intensity exercise for up to 150 minutes weekly; and  sleep for at least 7 hours a day.  I advised patient to maintain close follow up with their PCP for primary care needs.  Patient is asked to bring meter and  blood glucose logs during their next visit.   Follow up plan: Return in about 3 months (around 10/09/2015) for diabetes, high blood pressure, high cholesterol, follow up with pre-visit labs, meter, and logs.  Glade Lloyd, MD Phone: [phone]  Fax: [phone]   07/11/2015, 4:15 PM

## 2015-07-12 ENCOUNTER — Other Ambulatory Visit: Payer: Self-pay | Admitting: "Endocrinology

## 2015-07-17 ENCOUNTER — Telehealth: Payer: Self-pay

## 2015-07-17 NOTE — Telephone Encounter (Signed)
pls explain that the last script he received had sufficient tablets to last till appt date , needs to take them 3 times daily as prescribed If states pain is uncontrolled and excessive I am wiling to inccrease the gabapentin to one three times daily, now on one daily

## 2015-07-18 ENCOUNTER — Other Ambulatory Visit: Payer: Self-pay | Admitting: Family Medicine

## 2015-07-18 ENCOUNTER — Other Ambulatory Visit: Payer: Self-pay

## 2015-07-18 MED ORDER — GABAPENTIN 300 MG PO CAPS: 300.0000 mg | ORAL_CAPSULE | Freq: Three times a day (TID) | ORAL | Status: DC

## 2015-07-18 NOTE — Telephone Encounter (Signed)
Medication faxed.  Called and left voicemail notifying patient.

## 2015-07-18 NOTE — Telephone Encounter (Signed)
New dose [printed, pls fax and let him know

## 2015-07-18 NOTE — Telephone Encounter (Signed)
Patient agrees to increase in Gabapentin.  Will he be taking 300mg  TID?

## 2015-07-22 ENCOUNTER — Other Ambulatory Visit: Payer: Self-pay | Admitting: Family Medicine

## 2015-07-25 DIAGNOSIS — M47817 Spondylosis without myelopathy or radiculopathy, lumbosacral region: Secondary | ICD-10-CM | POA: Diagnosis not present

## 2015-07-25 DIAGNOSIS — Z79891 Long term (current) use of opiate analgesic: Secondary | ICD-10-CM | POA: Diagnosis not present

## 2015-07-25 DIAGNOSIS — M5136 Other intervertebral disc degeneration, lumbar region: Secondary | ICD-10-CM | POA: Diagnosis not present

## 2015-07-25 DIAGNOSIS — E1142 Type 2 diabetes mellitus with diabetic polyneuropathy: Secondary | ICD-10-CM | POA: Diagnosis not present

## 2015-07-26 ENCOUNTER — Other Ambulatory Visit: Payer: Self-pay | Admitting: Family Medicine

## 2015-08-03 ENCOUNTER — Other Ambulatory Visit: Payer: Self-pay | Admitting: Gastroenterology

## 2015-08-03 ENCOUNTER — Other Ambulatory Visit: Payer: Self-pay | Admitting: Family Medicine

## 2015-08-06 ENCOUNTER — Telehealth: Payer: Self-pay | Admitting: Family Medicine

## 2015-08-06 NOTE — Telephone Encounter (Signed)
Patient states that he needs to speak to a nurse regarding his medication and Dr. Ellouise Newer recommendation

## 2015-08-07 ENCOUNTER — Ambulatory Visit (INDEPENDENT_AMBULATORY_CARE_PROVIDER_SITE_OTHER): Payer: PPO

## 2015-08-07 ENCOUNTER — Ambulatory Visit: Payer: PPO | Admitting: Orthopedic Surgery

## 2015-08-07 DIAGNOSIS — M549 Dorsalgia, unspecified: Secondary | ICD-10-CM

## 2015-08-07 MED ORDER — KETOROLAC TROMETHAMINE 60 MG/2ML IM SOLN
60.0000 mg | Freq: Once | INTRAMUSCULAR | Status: AC
Start: 2015-08-07 — End: 2015-08-07
  Administered 2015-08-07: 60 mg via INTRAMUSCULAR

## 2015-08-07 MED ORDER — TRAMADOL HCL 50 MG PO TABS: 100.0000 mg | ORAL_TABLET | Freq: Three times a day (TID) | ORAL | Status: DC | PRN

## 2015-08-07 MED ORDER — METHYLPREDNISOLONE ACETATE 80 MG/ML IJ SUSP
80.0000 mg | Freq: Once | INTRAMUSCULAR | Status: AC
  Administered 2015-08-07: 80 mg via INTRAMUSCULAR

## 2015-08-07 NOTE — Telephone Encounter (Signed)
He can get toradol 60 mg iM and depomedrol 80 mg iM He CANNOT get hydrocodone form this office he has a failed drug test. I can only prescribe tramadol 100 mg three times daily, enter 90 tabs , let him know and notifty Dr Jenetta Downer toole's office of this , I will write a cover letter for Dr Lyla Son

## 2015-08-07 NOTE — Addendum Note (Signed)
Addended by: Denman George B on: 08/07/2015 12:21 PM   Modules accepted: Orders

## 2015-08-07 NOTE — Progress Notes (Signed)
Patient in for injections for back pain.   He had to be rescheduled with Dr. Francesco Runner.  Tramadol sent in for pain.   Letter also sent to Dr. Isabelle Course  office notifying of everything

## 2015-08-07 NOTE — Telephone Encounter (Signed)
Patient states that he did not pass urine drug screen done by Dr. Francesco Runner because Hydrocodone was not found in his system.  He is asking if he can come in for injections and if he can be prescribed Hydrocodone from this office again.    He is scheduled to have injection from Dr. Francesco Runner on the 28th but states that he cannot wait that long.  He was due to have one this week but Dr. Francesco Runner had to leave for an emergency.   Please advise.

## 2015-08-07 NOTE — Telephone Encounter (Signed)
Patient aware and in for injections.  Medication sent to pharmacy

## 2015-08-09 ENCOUNTER — Other Ambulatory Visit: Payer: Self-pay | Admitting: "Endocrinology

## 2015-08-12 ENCOUNTER — Telehealth: Payer: Self-pay | Admitting: Family Medicine

## 2015-08-12 DIAGNOSIS — M549 Dorsalgia, unspecified: Secondary | ICD-10-CM

## 2015-08-12 NOTE — Telephone Encounter (Signed)
Message left at Dr. Freddie Apley office for more information.

## 2015-08-12 NOTE — Telephone Encounter (Signed)
Patient is asking for another referral to pain management.  He would prefer not to go back to Dr. Francesco Runner.  Will check and see if Dr. Merlene Laughter is accepting pain management patients again.

## 2015-08-12 NOTE — Telephone Encounter (Signed)
Patient is asking for a returned call from [facility], please advise?

## 2015-08-13 NOTE — Telephone Encounter (Signed)
Referral has been sent to Dr. [place] °

## 2015-08-13 NOTE — Telephone Encounter (Signed)
Patient aware that he will be referred to Dr. Merlene Laughter

## 2015-08-21 ENCOUNTER — Ambulatory Visit: Payer: PPO | Admitting: Orthopedic Surgery

## 2015-08-26 ENCOUNTER — Other Ambulatory Visit: Payer: Self-pay | Admitting: Family Medicine

## 2015-08-27 ENCOUNTER — Telehealth: Payer: Self-pay | Admitting: Family Medicine

## 2015-08-27 NOTE — Telephone Encounter (Signed)
Asking to speak to [facility] in regards to pain management

## 2015-08-28 NOTE — Telephone Encounter (Signed)
Patient has not received and appointment from Dr. Merlene Laughter as of yet.  He wants to stay on Tramadol but would still like to get injections given by pain management.   He would like to see if someone can followup with Dr. Freddie Apley office and see what's going on.   If he can't be seen there soon.  He will continue to go to Dr. Francesco Runner and get the injection that he is scheduled for on 3/20.

## 2015-08-28 NOTE — Telephone Encounter (Signed)
I have left a message for the referral coordinator at Dr. Alessandra Grout office

## 2015-08-29 NOTE — Telephone Encounter (Signed)
Patient is scheduled with Dr. Merlene Laughter on Monday March 6th at 3:15

## 2015-09-02 DIAGNOSIS — M159 Polyosteoarthritis, unspecified: Secondary | ICD-10-CM | POA: Diagnosis not present

## 2015-09-02 DIAGNOSIS — M25539 Pain in unspecified wrist: Secondary | ICD-10-CM | POA: Diagnosis not present

## 2015-09-02 DIAGNOSIS — M545 Low back pain: Secondary | ICD-10-CM | POA: Diagnosis not present

## 2015-09-02 DIAGNOSIS — G5600 Carpal tunnel syndrome, unspecified upper limb: Secondary | ICD-10-CM | POA: Diagnosis not present

## 2015-09-02 DIAGNOSIS — R208 Other disturbances of skin sensation: Secondary | ICD-10-CM | POA: Diagnosis not present

## 2015-09-02 DIAGNOSIS — M175 Other unilateral secondary osteoarthritis of knee: Secondary | ICD-10-CM | POA: Diagnosis not present

## 2015-09-02 DIAGNOSIS — I10 Essential (primary) hypertension: Secondary | ICD-10-CM | POA: Diagnosis not present

## 2015-09-02 DIAGNOSIS — Z79899 Other long term (current) drug therapy: Secondary | ICD-10-CM | POA: Diagnosis not present

## 2015-09-02 DIAGNOSIS — E291 Testicular hypofunction: Secondary | ICD-10-CM | POA: Diagnosis not present

## 2015-09-04 ENCOUNTER — Ambulatory Visit (INDEPENDENT_AMBULATORY_CARE_PROVIDER_SITE_OTHER): Payer: PPO | Admitting: Orthopedic Surgery

## 2015-09-04 VITALS — BP 129/68 | HR 81 | Ht 72.0 in | Wt >= 6400 oz

## 2015-09-04 DIAGNOSIS — M5137 Other intervertebral disc degeneration, lumbosacral region: Secondary | ICD-10-CM

## 2015-09-04 NOTE — Patient Instructions (Signed)
Call hospital to arrange PT  

## 2015-09-04 NOTE — Progress Notes (Signed)
Patient ID: Nicholas [name], male   DOB: [birth year], 60 y.o.   MRN: [id number]  Chief Complaint  Patient presents with  . Back Pain    HPI Nicholas [name] is a 60 y.o. male.  As for evaluation of chronic back pain  He reports atraumatic onset of back pain several months to several years ago with sharp throbbing burning aching pain associated with numbness and tingling radiating into the left hip and thigh down to the knee. He has 6 out of 10 pain. Has numbness and tingling and burning pain in his legs with his back pain. He has had gabapentin and is currently on that as well as prednisone Dosepak and tramadol.  X-rays have been done and those x-rays show scoliosis and multilevel arthritis without spondylolisthesis  Review of Systems Review of Systems Dental problems vision problems depression numbness tingling burning pain joint pain muscle weakness back pain  The remaining review of systems are negative  Past Medical History  Diagnosis Date  . Diabetes ([place])   . Hyperlipidemia   . Testosterone deficiency   . Hypertension   . Poor circulation     leg   . MRSA (methicillin resistant Staphylococcus aureus)   . Chronic knee pain   . Depression   . Glaucoma   . Suicide attempt [facility])     Past Surgical History  Procedure Laterality Date  . Kidney stones      cysto with litholapexy  . Colonoscopy  03/2008    [id number] external hemorrhoidal tag, otherwise normal rectum/Two diminutive rectosigmoid polyps s/p bx/ Polyp in the opposite the ileocecal valve s/p bx. tubular adenoma.  . Colonoscopy N/A 03/23/2014    [id number] colon polyps throughout his rectum and colon. Sessile and pedunculated. The largest polyp was proximally 6 mm in dimensions in the retum at 3 cm with adjacent diminutive polyp.  There was also a polyps on ileocecal valve, ascending segmen descending and sigmoid segment. as the colon was tortuous & elongated requiring externa abd pressure and changing of the pts  postion to reach cecum  . Esophagogastroduodenoscopy N/A 03/23/2014    RMR: 1. Patient had circumferential distal esophageal erosions within 5 mm of the GE junction.  No Barrett's esophagus. Tubular esophagus patent throughout its course. 2.  Diffuse gastric erosions. (1) 42mm area of healing ulceration in the antrum. No ulcer or infiltrating process observed. Patent pylorus. Normal-appearing first and second portion of the duodenum .  Marland Kitchen Cholecystectomy N/A 02/25/2015    Procedure: LAPAROSCOPIC CHOLECYSTECTOMY;  Surgeon: Aviva Signs, MD;  Location: [facility];  Service: General;  Laterality: N/A;    Family History  Problem Relation Age of Onset  . Diabetes Mother   . Leukemia Mother   . Cancer Father     colon, age greater than 75?  Marland Kitchen Heart disease Father   . Glaucoma Father   . Parkinsonism Father     altzheimers , brain hemorrhage  . Colon cancer Neg Hx     Social History Social History  Substance Use Topics  . Smoking status: Former Smoker -- 1.50 packs/day for 25 years    Types: Cigarettes  . Smokeless tobacco: Former Systems developer    Quit date: 02/20/2002     Comment: Quit x 13 years  . Alcohol Use: No    No Known Allergies  Current Outpatient Prescriptions  Medication Sig Dispense Refill  . ACCU-CHEK AVIVA PLUS test strip TEST FOUR TIMES A DAY AS DIRECTED. 100 each 5  . aspirin (ASPIRIN LOW  DOSE) 81 MG EC tablet Take 81 mg by mouth daily. Take one tablet by mouth once a day     . azelastine (ASTELIN) 0.1 % nasal spray Place 2 sprays into both nostrils 2 (two) times daily. 30 mL 2  . B-D INS SYR ULTRAFINE 1CC/31G 31G X 5/16" 1 ML MISC USE AS DIRECTED WITH INSULIN VIALS TWICE DAILY. 100 each 2  . B-D INS SYRINGE 0.5CC/31GX5/16 31G X 5/16" 0.5 ML MISC USE TO INJECT THREE TIMES DAILY. 100 each PRN  . brimonidine (ALPHAGAN P) 0.1 % SOLN Place 1 drop into both eyes 2 (two) times daily. One drop in each eye two times a day     . brimonidine-timolol (COMBIGAN) 0.2-0.5 % ophthalmic solution  Place 1 drop into both eyes every 12 (twelve) hours. One drop in each eye two times a day      . citalopram (CELEXA) 40 MG tablet TAKE 1 TABLET BY MOUTH ONCE A DAY. 30 tablet 0  . clotrimazole-betamethasone (LOTRISONE) cream APPLY TO AFFECTED AREAS TWICE DAILY. 45 g 0  . cyclobenzaprine (FLEXERIL) 10 MG tablet TAKE (1) TABLET BY MOUTH EVERY EIGHT HOURS AS NEEDED FOR MUSCLE SPASMS. 30 tablet 2  . furosemide (LASIX) 20 MG tablet Take 1 tablet (20 mg total) by mouth daily. 30 tablet 1  . gabapentin (NEURONTIN) 300 MG capsule Take 1 capsule (300 mg total) by mouth 3 (three) times daily. 90 capsule 3  . HUMULIN R U-500 KWIKPEN 500 UNIT/ML kwikpen INJECT UP TO 100 UNITS SUBCUTANEOUSLY 30 MINUTES BEFORE MEALS. 18 mL 3  . HYDROcodone-acetaminophen (NORCO) 7.5-325 MG tablet One tablet three times daily as needed , for uncontrolled back pain 90 tablet 0  . ibuprofen (ADVIL,MOTRIN) 800 MG tablet Take 1 tablet (800 mg total) by mouth 2 (two) times daily. 14 tablet 0  . ibuprofen (ADVIL,MOTRIN) 800 MG tablet Take 1 tablet (800 mg total) by mouth 2 (two) times daily as needed. 14 tablet 0  . ketoconazole (NIZORAL) 2 % shampoo APPLY TOPICALLY TWICE WEEKLY AS DIRECTED. 120 mL 1  . lamoTRIgine (LAMICTAL) 200 MG tablet TAKE 1 TABLET BY MOUTH ONCE A DAY. 30 tablet 1  . lisinopril (PRINIVIL,ZESTRIL) 5 MG tablet TAKE 1 TABLET BY MOUTH ONCE A DAY. 30 tablet 0  . NOVOFINE 30G X 8 MM MISC USE TWICE DAILY WITH BYETTA 100 each 3  . nystatin (MYCOSTATIN) powder APPLY TO AFFECTED AREA TWICE DAILY FOR 10 DAYS. 60 g 1  . nystatin-triamcinolone ointment (MYCOLOG) APPLY TO AFFECTED AREA TWICE DAILY. 60 g 2  . pantoprazole (PROTONIX) 40 MG tablet TAKE (1) TABLET BY MOUTH TWICE DAILY. 60 tablet 5  . potassium chloride SA (KLOR-CON M20) 20 MEQ tablet Take 1 tablet (20 mEq total) by mouth daily. 30 tablet 1  . predniSONE (STERAPRED UNI-PAK 21 TAB) 5 MG (21) TBPK tablet Use as directed 21 tablet 0  . simvastatin (ZOCOR) 40 MG tablet  TAKE 1 TABLET BY MOUTH ONCE A DAY. 30 tablet 2  . Testosterone (ANDROGEL PUMP TD) Place 75 g onto the skin every morning.    . traMADol (ULTRAM) 50 MG tablet TAKE 1 TABLET BY MOUTH ONCE DAILY AS NEEDED FOR KNEE PAIN. 30 tablet 0  . traMADol (ULTRAM) 50 MG tablet Take 2 tablets (100 mg total) by mouth every 8 (eight) hours as needed. 180 tablet 0  . triamcinolone ointment (KENALOG) 0.1 % APPLY TO AFFECTED AREAS TWICE DAILY. 30 g 3  . VICTOZA 18 MG/3ML SOPN INJECT 1.8 MG SUBCUTANEOUSLY ONCE  DAILY. 9 mL 2  . Vitamin D, Ergocalciferol, (DRISDOL) 50000 units CAPS capsule TAKE ONE CAPSULE BY MOUTH ONCE WEEKLY. 12 capsule 0  . baclofen (LIORESAL) 10 MG tablet     . DULoxetine (CYMBALTA) 30 MG capsule     . gabapentin (NEURONTIN) 400 MG capsule     . TRAVATAN Z 0.004 % SOLN ophthalmic solution      No current facility-administered medications for this visit.       Physical Exam Physical Exam Blood pressure 129/68, pulse 81, height 6' (1.829 m), weight 428 lb (194.14 kg). Appearance, there are no abnormalities in terms of appearance the patient was well-developed and well-nourished. The grooming and hygiene were normal. Severe obesity  Mental status orientation, there was normal alertness and orientation Mood pleasant Ambulatory status normal with no assistive devices  Examination of the lumbar spine reveal tenderness at the L4-5 disc space and also on the left gluteal area and lower back across the beltline Decreased spinal flexion extension and rotation were noted.  The lower extremities exhibited normal dorsiflexion plantar flexion extension flexion of the knee as well as hip flexion. No atrophy was noted. The skin overlying the lumbar thoracic and cervical regions was warm dry and intact without laceration. However he had multiple skin lesions consistent with multiple moles which have not been evaluated   The lower extremities severe venous stasis changes with redness and erythema no  tenderness and dryness. Neurologic examination  Reflexes were 2+ and equal  Sensation was normal in both feet and legs    Straight leg raise testing was normal bilaterally  The vascular examination revealed no peripheral edema   Data Reviewed X-ray report as noted above plain films of been reviewed I agree has multilevel degenerative scoliosis and lumbar spondylosis  Assessment  Chronic back pain with scoliosis and lumbar spondylosis  Recommend physical therapy  The patient is scheduled to see Dr. Merlene Laughter  for injection he will come back in 6 weeks if physical therapy fails we will send him for CT scan he cannot do MRI   Plan  As above

## 2015-09-05 ENCOUNTER — Other Ambulatory Visit: Payer: Self-pay | Admitting: Family Medicine

## 2015-09-05 ENCOUNTER — Telehealth: Payer: Self-pay | Admitting: Family Medicine

## 2015-09-05 MED ORDER — TRAMADOL HCL 50 MG PO TABS: 100.0000 mg | ORAL_TABLET | Freq: Three times a day (TID) | ORAL | Status: DC | PRN

## 2015-09-05 MED ORDER — TRAMADOL HCL 50 MG PO TABS: ORAL_TABLET | ORAL | Status: DC

## 2015-09-05 NOTE — Telephone Encounter (Signed)
Script for 100mg  three tiimes daily of tramadol printed, if ins wont cover print 50 mg two tabs three times daily #180 , as he ahs had in the past, but see if 100mg  tab covered pls Let pt know once completed

## 2015-09-05 NOTE — Telephone Encounter (Signed)
Pt has been referred to pain clinic, [place] ,Dr Lyla Son to prescribe, pls do research Check registery,let me know

## 2015-09-05 NOTE — Addendum Note (Signed)
Addended by: Fayrene Helper on: 09/05/2015 03:39 PM   Modules accepted: Orders

## 2015-09-05 NOTE — Telephone Encounter (Signed)
Looked up the registry and printed and is in Dr's box to review

## 2015-09-05 NOTE — Addendum Note (Signed)
Addended by: Eual Fines on: 09/05/2015 04:01 PM   Modules accepted: Orders, Medications

## 2015-09-05 NOTE — Telephone Encounter (Signed)
Wants to know if you will continue to prescribe tramadol. Only receiving injections at [facility]

## 2015-09-05 NOTE — Telephone Encounter (Signed)
Nicholas [name] is asking for a return call from the nurse regarding medication Tramadol and Dr. Moshe Cipro writing it, please advise?

## 2015-09-05 NOTE — Telephone Encounter (Signed)
Med entered

## 2015-09-16 ENCOUNTER — Ambulatory Visit (INDEPENDENT_AMBULATORY_CARE_PROVIDER_SITE_OTHER): Payer: PPO | Admitting: Sports Medicine

## 2015-09-16 ENCOUNTER — Encounter: Payer: Self-pay | Admitting: Sports Medicine

## 2015-09-16 DIAGNOSIS — E11621 Type 2 diabetes mellitus with foot ulcer: Secondary | ICD-10-CM

## 2015-09-16 DIAGNOSIS — L89891 Pressure ulcer of other site, stage 1: Secondary | ICD-10-CM | POA: Diagnosis not present

## 2015-09-16 DIAGNOSIS — E1149 Type 2 diabetes mellitus with other diabetic neurological complication: Secondary | ICD-10-CM | POA: Diagnosis not present

## 2015-09-16 DIAGNOSIS — L97519 Non-pressure chronic ulcer of other part of right foot with unspecified severity: Principal | ICD-10-CM

## 2015-09-16 DIAGNOSIS — I739 Peripheral vascular disease, unspecified: Secondary | ICD-10-CM

## 2015-09-16 DIAGNOSIS — M79676 Pain in unspecified toe(s): Secondary | ICD-10-CM

## 2015-09-16 NOTE — Progress Notes (Signed)
Patient ID: Nicholas [name], male   DOB: September 17, 1955, 60 y.o.   MRN: [id number] Subjective: Nicholas [name] is a 60 y.o. male patient seen in office for evaluation of ulceration of the right big toe; states that it has been a callus there for a while and that he has been applying neosporin but has noticed bloody drainage so thought he should have it checked. Patient has a history of diabetes and a blood glucose level today of 120 mg/dl. Denies nausea/fever/vomiting/chills/night sweats/shortness of breath/pain. Patient has no other pedal complaints at this time.  Patient Active Problem List   Diagnosis Date Noted  . Back pain with left-sided radiculopathy 06/05/2015  . Hyperlipidemia 04/10/2015  . Benign hypertension 04/10/2015  . Gastritis 01/15/2015  . Abdominal wall bulge 01/15/2015  . Medicare annual wellness visit, subsequent 10/15/2014  . Wrist pain, right 10/15/2014  . Atypical chest pain 07/22/2014  . Hx of adenomatous colonic polyps 02/21/2014  . Candidiasis 11/28/2013  . Allergic rhinitis 07/24/2013  . Cough due to bronchospasm 05/24/2013  . Onychomycosis 05/21/2013  . 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 Back pain with radiation 04/20/2011  . Seborrheic dermatitis 11/20/2010  . Dermatomycosis 11/20/2010  . FATIGUE 04/01/2010  . CARPAL TUNNEL SYNDROME, BILATERAL 03/24/2010  . Hypogonadism in male 01/27/2010  . DEPRESSION/ANXIETY 01/27/2010  . CHF 01/13/2010  . DYSPNEA 01/13/2010  . Type 2 diabetes mellitus with diabetic nephropathy ([place]) 12/12/2009  . MORBID OBESITY 12/12/2009  . Benign hypertension 12/12/2009  . PERIPHERAL VASCULAR INSUFFICIENCY,  LEGS, BILATERAL 12/12/2009   Current Outpatient Prescriptions on File Prior to Visit  Medication Sig Dispense Refill  . ACCU-CHEK AVIVA PLUS test strip TEST FOUR TIMES A DAY AS DIRECTED. 100 each 5  . aspirin (ASPIRIN LOW DOSE) 81 MG EC tablet Take 81 mg by mouth daily. Take one tablet by mouth  once a day     . azelastine (ASTELIN) 0.1 % nasal spray Place 2 sprays into both nostrils 2 (two) times daily. 30 mL 2  . B-D INS SYR ULTRAFINE 1CC/31G 31G X 5/16" 1 ML MISC USE AS DIRECTED WITH INSULIN VIALS TWICE DAILY. 100 each 2  . B-D INS SYRINGE 0.5CC/31GX5/16 31G X 5/16" 0.5 ML MISC USE TO INJECT THREE TIMES DAILY. 100 each PRN  . baclofen (LIORESAL) 10 MG tablet     . brimonidine (ALPHAGAN P) 0.1 % SOLN Place 1 drop into both eyes 2 (two) times daily. One drop in each eye two times a day     . brimonidine-timolol (COMBIGAN) 0.2-0.5 % ophthalmic solution Place 1 drop into both eyes every 12 (twelve) hours. One drop in each eye two times a day      . citalopram (CELEXA) 40 MG tablet TAKE 1 TABLET BY MOUTH ONCE A DAY. 30 tablet 0  . clotrimazole-betamethasone (LOTRISONE) cream APPLY TO AFFECTED AREAS TWICE DAILY. 45 g 0  . cyclobenzaprine (FLEXERIL) 10 MG tablet TAKE (1) TABLET BY MOUTH EVERY EIGHT HOURS AS NEEDED FOR MUSCLE SPASMS. 30 tablet 2  . DULoxetine (CYMBALTA) 30 MG capsule     . furosemide (LASIX) 20 MG tablet Take 1 tablet (20 mg total) by mouth daily. 30 tablet 1  . gabapentin (NEURONTIN) 300 MG capsule Take 1 capsule (300 mg total) by mouth 3 (three) times daily. 90 capsule 3  . gabapentin (NEURONTIN) 400 MG capsule     . HUMULIN R U-500 KWIKPEN 500 UNIT/ML kwikpen INJECT UP TO 100 UNITS SUBCUTANEOUSLY 30 MINUTES BEFORE MEALS.  18 mL 3  . HYDROcodone-acetaminophen (NORCO) 7.5-325 MG tablet One tablet three times daily as needed , for uncontrolled back pain 90 tablet 0  . ibuprofen (ADVIL,MOTRIN) 800 MG tablet Take 1 tablet (800 mg total) by mouth 2 (two) times daily. 14 tablet 0  . ibuprofen (ADVIL,MOTRIN) 800 MG tablet Take 1 tablet (800 mg total) by mouth 2 (two) times daily as needed. 14 tablet 0  . ketoconazole (NIZORAL) 2 % shampoo APPLY TOPICALLY TWICE WEEKLY AS DIRECTED. 120 mL 1  . lamoTRIgine (LAMICTAL) 200 MG tablet TAKE 1 TABLET BY MOUTH ONCE A DAY. 30 tablet 1  .  lisinopril (PRINIVIL,ZESTRIL) 5 MG tablet TAKE 1 TABLET BY MOUTH ONCE A DAY. 30 tablet 0  . NOVOFINE 30G X 8 MM MISC USE TWICE DAILY WITH BYETTA 100 each 3  . nystatin (MYCOSTATIN) powder APPLY TO AFFECTED AREA TWICE DAILY FOR 10 DAYS. 60 g 1  . nystatin-triamcinolone ointment (MYCOLOG) APPLY TO AFFECTED AREA TWICE DAILY. 60 g 2  . pantoprazole (PROTONIX) 40 MG tablet TAKE (1) TABLET BY MOUTH TWICE DAILY. 60 tablet 5  . potassium chloride SA (KLOR-CON M20) 20 MEQ tablet Take 1 tablet (20 mEq total) by mouth daily. 30 tablet 1  . predniSONE (STERAPRED UNI-PAK 21 TAB) 5 MG (21) TBPK tablet Use as directed 21 tablet 0  . simvastatin (ZOCOR) 40 MG tablet TAKE 1 TABLET BY MOUTH ONCE A DAY. 30 tablet 2  . Testosterone (ANDROGEL PUMP TD) Place 75 g onto the skin every morning.    . traMADol (ULTRAM) 50 MG tablet Take 2 tablets (100 mg total) by mouth 3 (three) times daily as needed. 180 tablet 2  . TRAVATAN Z 0.004 % SOLN ophthalmic solution     . triamcinolone ointment (KENALOG) 0.1 % APPLY TO AFFECTED AREAS TWICE DAILY. 30 g 3  . VICTOZA 18 MG/3ML SOPN INJECT 1.8 MG SUBCUTANEOUSLY ONCE DAILY. 9 mL 2  . Vitamin D, Ergocalciferol, (DRISDOL) 50000 units CAPS capsule TAKE ONE CAPSULE BY MOUTH ONCE WEEKLY. 12 capsule 0   No current facility-administered medications on file prior to visit.   No Known Allergies  Recent Results (from the past 2160 hour(s))  Basic metabolic panel     Status: Abnormal   Collection Time: 07/02/15 11:01 AM  Result Value Ref Range   Sodium 140 135 - 146 mmol/L   Potassium 5.0 3.5 - 5.3 mmol/L   Chloride 99 98 - 110 mmol/L   CO2 29 20 - 31 mmol/L   Glucose, Bld 161 (H) 65 - 99 mg/dL   BUN 19 7 - 25 mg/dL   Creat 1.01 0.70 - 1.25 mg/dL   Calcium 9.0 8.6 - 10.3 mg/dL  Hemoglobin A1c     Status: Abnormal   Collection Time: 07/02/15 11:01 AM  Result Value Ref Range   Hgb A1c MFr Bld 8.0 (H) <5.7 %    Comment:                                                                         According to the ADA Clinical Practice Recommendations for 2011, when HbA1c is used as a screening test:     >=6.5%   Diagnostic of Diabetes Mellitus            (  if abnormal result is confirmed)   5.7-6.4%   Increased risk of developing Diabetes Mellitus   References:Diagnosis and Classification of Diabetes Mellitus,Diabetes [id number] 1):S62-S69 and Standards of Medical Care in         Diabetes - 2011,Diabetes [id number] (Suppl 1):S11-S61.      Mean Plasma Glucose 183 (H) <117 mg/dL    Objective: There were no vitals filed for this visit.  General: Patient is awake, alert, oriented x 3 and in no acute distress.  Dermatology: Skin is warm and dry bilateral with a partial thickness ulceration present plantar right hallux. Ulceration measures 1cm x 0.8cm x 0.1 cm. There is a keratotic border with a granular base. The ulceration does not  probe to bone. There is no malodor, no active drainage, no erythema, no edema. No acute signs of infection.   Vascular: Dorsalis Pedis pulse = 0/4 Bilateral,  Posterior Tibial pulse = 1/4 Bilateral faint,  Capillary Fill Time < 5 seconds, + chronic venous skin changes bilateral   Neurologic:Protective sensation diminished using the 5.07/10g Semmes Weinstein Monofilament. Vibratory diminished bilateral.   Musculosketal:  No Pain with palpation to ulcerated area. No pain with compression to calves bilateral.   Assessment and Plan:  Problem List Items Addressed This Visit    None    Visit Diagnoses    Diabetic ulcer of right foot associated with type 2 diabetes mellitus ([place])    -  Primary    plantar hallux    PVD (peripheral vascular disease) ([place])        Type II diabetes mellitus with neurological manifestations ([place])        Pain of toe, unspecified laterality          -Examined patient and discussed the progression of the wound and treatment alternatives. -Excisionally dedbrided ulceration to healthy bleeding borders using  a sterile chisel blade. -Applied topical antibiotic and dry sterile dressing and instructed patient to continue with daily dressings at home consisting of triple antibiotic and bandaid/dry sterile dressing. -Will consider repeat xrays, vascular consult, and post op shoe if fails to continue to improve -Advised patient to go to the ER or return to office if the wound worsens or if constitutional symptoms are present. -Patient to return to office in 2 weeks for follow up care and evaluation or sooner if problems arise.  Landis Martins, DPM

## 2015-09-16 NOTE — Patient Instructions (Signed)
Okeeffe's healthy feet cream 

## 2015-09-23 ENCOUNTER — Other Ambulatory Visit: Payer: Self-pay | Admitting: Family Medicine

## 2015-09-24 ENCOUNTER — Telehealth: Payer: Self-pay | Admitting: Family Medicine

## 2015-09-24 NOTE — Telephone Encounter (Signed)
Patient is asking to speak to [facility], please advise?

## 2015-09-24 NOTE — Telephone Encounter (Signed)
Patient states that he was scheduled for an injection by Dr. Merlene Laughter.  He has received all instructions and the day before the procedure he received a call from that office stating that they felt it would be a liability for them to do procedure because of his weight.  Apparently his weight exceeds the limits for the the table.   Patient is very upset and would like to know is there anything that can be done by this office.  States that he was told that he may be sent to [facility] but he is unsure of what exactly he was told.

## 2015-09-24 NOTE — Telephone Encounter (Signed)
pls let him know I will be in touch with Dr Merlene Laughter for alternat e options, sorry about how this went

## 2015-09-25 NOTE — Telephone Encounter (Signed)
Patient aware.

## 2015-09-30 ENCOUNTER — Telehealth: Payer: Self-pay | Admitting: Family Medicine

## 2015-09-30 NOTE — Telephone Encounter (Signed)
Patient is asking to speak to [facility], please advise?

## 2015-10-01 ENCOUNTER — Telehealth: Payer: Self-pay | Admitting: Family Medicine

## 2015-10-01 ENCOUNTER — Ambulatory Visit (HOSPITAL_COMMUNITY): Payer: PPO | Attending: Orthopedic Surgery | Admitting: Physical Therapy

## 2015-10-01 ENCOUNTER — Other Ambulatory Visit: Payer: Self-pay | Admitting: "Endocrinology

## 2015-10-01 DIAGNOSIS — R269 Unspecified abnormalities of gait and mobility: Secondary | ICD-10-CM

## 2015-10-01 DIAGNOSIS — R29898 Other symptoms and signs involving the musculoskeletal system: Secondary | ICD-10-CM

## 2015-10-01 DIAGNOSIS — M5442 Lumbago with sciatica, left side: Secondary | ICD-10-CM

## 2015-10-01 DIAGNOSIS — M6281 Muscle weakness (generalized): Secondary | ICD-10-CM | POA: Insufficient documentation

## 2015-10-01 DIAGNOSIS — R2689 Other abnormalities of gait and mobility: Secondary | ICD-10-CM | POA: Diagnosis not present

## 2015-10-01 DIAGNOSIS — R6889 Other general symptoms and signs: Secondary | ICD-10-CM

## 2015-10-01 DIAGNOSIS — L84 Corns and callosities: Secondary | ICD-10-CM | POA: Diagnosis not present

## 2015-10-01 DIAGNOSIS — E669 Obesity, unspecified: Secondary | ICD-10-CM

## 2015-10-01 DIAGNOSIS — E11628 Type 2 diabetes mellitus with other skin complications: Secondary | ICD-10-CM | POA: Diagnosis not present

## 2015-10-01 LAB — BASIC METABOLIC PANEL
BUN: 17 mg/dL (ref 7–25)
CALCIUM: 9.1 mg/dL (ref 8.6–10.3)
CO2: 25 mmol/L (ref 20–31)
Chloride: 105 mmol/L (ref 98–110)
Creat: 1.03 mg/dL (ref 0.70–1.25)
GLUCOSE: 148 mg/dL — AB (ref 65–99)
Potassium: 4.5 mmol/L (ref 3.5–5.3)
SODIUM: 136 mmol/L (ref 135–146)

## 2015-10-01 LAB — TSH: TSH: 2.3 mIU/L (ref 0.40–4.50)

## 2015-10-01 LAB — T4, FREE: Free T4: 1 ng/dL (ref 0.8–1.8)

## 2015-10-01 LAB — HEMOGLOBIN A1C
Hgb A1c MFr Bld: 6.3 % — ABNORMAL HIGH (ref <5.7)
Mean Plasma Glucose: 134 mg/dL

## 2015-10-01 NOTE — Telephone Encounter (Signed)
Pls call and explain weigh max in [place] also, will need to work with pain specialist on med management of his chronic pain Also explain the shots dont always work and will certainly make his blood sugar temporarily high

## 2015-10-01 NOTE — Patient Instructions (Signed)
Flexibility: Neck Retraction    Pull head straight back, keeping eyes and jaw level. Repeat ____10 times per set. Do __2__ sets per session. Do _4___ sessions per day.  http://orth.exer.us/344   Copyright  VHI. All rights reserved.  Scapular Retraction (Standing)    With arms at sides, pinch shoulder blades together. Repeat __10__ times per set. Do _1___ sets per session. Do __4_ sessions per day.  http://orth.exer.us/944   Copyright  VHI. All rights reserved.  Isometric Abdominal   May do sitting  Lying on back with knees bent, tighten stomach by pressing elbows down. Hold ___3-5_ seconds. Repeat __10__ times per set. Do __1__ sets per session. Do __4__ sessions per day.  http://orth.exer.us/1086   Copyright  VHI. All rights reserved.  Isometric Gluteals   May do sitting  Tighten buttock muscles. Repeat _10___ times per set. Do _2___ sets per session. Do __4__ sessions per day.  http://orth.exer.us/1126   Copyright  VHI. All rights reserved.  Strengthening: Hip Adduction - Isometric    With ball or folded pillow between knees, squeeze knees together. Hold _4___ seconds. Repeat _10___ times per set. Do __1__ sets per session. Do _4___ sessions per day.  http://orth.exer.us/612   Copyright  VHI. All rights reserved.

## 2015-10-01 NOTE — Telephone Encounter (Signed)
I sent you an additional  NEW message today, pls see updated addendum

## 2015-10-01 NOTE — Telephone Encounter (Signed)
I spoke to [place] at [facility] regarding Nicholas [name] weight and is it possible for him to receive an epidural injection and she stated that the table only has a weight limit of 400lbs

## 2015-10-01 NOTE — Telephone Encounter (Signed)
Patient aware and will consult with Dr. Merlene Laughter.

## 2015-10-01 NOTE — Therapy (Signed)
[place], [place], [address] Phone: [phone]   Fax:  [phone]  Physical Therapy Evaluation  Patient Details  Name: GRAIG CHARLESTON MRN: [id number] Date of Birth: [birth year] Referring Provider: Arther Abbott  Encounter Date: 10/01/2015      PT End of Session - 10/01/15 1139    Visit Number 1   Number of Visits 16   Date for PT Re-Evaluation 11/10/15   Authorization Type [employer]    Authorization - Visit Number 1   Authorization - Number of Visits 10   PT Start Time 1105   PT Stop Time 1145   PT Time Calculation (min) 40 min   Activity Tolerance Patient tolerated treatment well   Behavior During Therapy [facility] for tasks assessed/performed      Past Medical History  Diagnosis Date  . Diabetes ([place])   . Hyperlipidemia   . Testosterone deficiency   . Hypertension   . Poor circulation     leg   . MRSA (methicillin resistant Staphylococcus aureus)   . Chronic knee pain   . Depression   . Glaucoma   . Suicide attempt [facility])     Past Surgical History  Procedure Laterality Date  . Kidney stones      cysto with litholapexy  . Colonoscopy  03/2008    [id number] external hemorrhoidal tag, otherwise normal rectum/Two diminutive rectosigmoid polyps s/p bx/ Polyp in the opposite the ileocecal valve s/p bx. tubular adenoma.  . Colonoscopy N/A 03/23/2014    [id number] colon polyps throughout his rectum and colon. Sessile and pedunculated. The largest polyp was proximally 6 mm in dimensions in the retum at 3 cm with adjacent diminutive polyp.  There was also a polyps on ileocecal valve, ascending segmen descending and sigmoid segment. as the colon was tortuous & elongated requiring externa abd pressure and changing of the pts postion to reach cecum  . Esophagogastroduodenoscopy N/A 03/23/2014    RMR: 1. Patient had circumferential distal esophageal erosions within 5 mm of the GE junction.  No  Barrett's esophagus. Tubular esophagus patent throughout its course. 2.  Diffuse gastric erosions. (1) 22mm area of healing ulceration in the antrum. No ulcer or infiltrating process observed. Patent pylorus. Normal-appearing first and second portion of the duodenum .  Marland Kitchen Cholecystectomy N/A 02/25/2015    Procedure: LAPAROSCOPIC CHOLECYSTECTOMY;  Surgeon: Aviva Signs, MD;  Location: [facility];  Service: General;  Laterality: N/A;    There were no vitals filed for this visit.  Visit Diagnosis:  Left-sided low back pain with left-sided sciatica  Leg weakness, bilateral  Decreased functional activity tolerance  Abnormal gait  Obesity      Subjective Assessment - 10/01/15 1106    Subjective Mr. Decarli has had progressive radiculating low back pain for years that has exacerbated in the past four months.  His pain has gotten to the point that he can not go to church, his is limited in his llifting standing and walking.  It is difficult for him to complete his grocery shopping. His pain radiates into his let leg and will go down as far as his foot.  He has been referred to skilled physical therapy to try and decrease his symptoms of pain.    Pertinent History HTM , DM   How long can you sit comfortably? Less than ten minutes    How long can you stand comfortably? less than two minutes    How long can you walk comfortably?  two minutes    Patient Stated Goals sleeps in a recliner unable to lie supine    Currently in Pain? Yes   Pain Score 8    Pain Location Back   Pain Orientation Left   Pain Descriptors / Indicators Aching;Stabbing;Throbbing;Tingling   Pain Type Chronic pain   Pain Radiating Towards to knee    Pain Onset More than a month ago   Pain Frequency Constant   Aggravating Factors  activity             [facility] PT Assessment - 10/01/15 0001    Assessment   Medical Diagnosis lumbar pain   Referring Provider Arther Abbott   Onset Date/Surgical Date 09/06/15   Next MD Visit  10/25/2015   Prior Therapy none   Precautions   Precautions None   Restrictions   Weight Bearing Restrictions No   Balance Screen   Has the patient fallen in the past 6 months No   Has the patient had a decrease in activity level because of a fear of falling?  Yes   Is the patient reluctant to leave their home because of a fear of falling?  Yes   [place] residence   Prior Function   Level of Independence Independent   Vocation On disability   Leisure none    Cognition   Overall Cognitive Status Within Functional Limits for tasks assessed   Observation/Other Assessments   Focus on Therapeutic Outcomes (FOTO)  38   Functional Tests   Functional tests Single leg stance;Sit to Stand   Single Leg Stance   Comments Unable to single leg stance on either leg    Sit to Stand   Comments unable to come to standing without losing balance    ROM / Strength   AROM / PROM / Strength Strength   Strength   Strength Assessment Site Knee;Ankle   Right/Left Hip Right;Left   Right Hip Extension 2+/5   Right Hip ABduction 4/5   Right Hip ADduction 3+/5   Left Hip Extension 2+/5   Left Hip ABduction 4/5   Left Hip ADduction 3+/5   Right/Left Knee Right;Left   Right Knee Extension 4-/5   Left Knee Extension 4-/5   Right/Left Ankle Right;Left   Right Ankle Dorsiflexion 4/5   Left Ankle Dorsiflexion 4/5                   [facility] Adult PT Treatment/Exercise - 10/01/15 0001    Posture/Postural Control   Posture/Postural Control Postural limitations   Postural Limitations Rounded Shoulders;Decreased lumbar lordosis;Increased thoracic kyphosis   Exercises   Exercises Lumbar;Knee/Hip   Lumbar Exercises: Seated   Other Seated Lumbar Exercises cervical and scapular retraction; abdominal and glut sets all x 10    Other Seated Lumbar Exercises hip adduction x 10                 PT Education - 10/01/15 1134    Education provided Yes    Education Details HEP   Person(s) Educated Patient   Methods Explanation;Handout   Comprehension Verbalized understanding;Returned demonstration          PT Short Term Goals - 10/01/15 1228    PT SHORT TERM GOAL #1   Title Pt bilateral lower extremity and core strength to be increased at least 1/2 grade to allow pt to be able to come sit to stand without the use of his UE without losing his balance.  Time 3   Period Weeks   Status New   PT SHORT TERM GOAL #2   Title Pt pain level to be decreased to no greater than a 5/10 to allow pt to ambulate for at least 15 minutes to be able to complete short shopping trips and to be able to go to church   Time 4   Period Weeks   Status New   PT SHORT TERM GOAL #3   Title Pt to be able to single leg stance for at least 10 seconds on both legs to reduce risk of falling.    Time 4   Period Weeks   Status New   PT SHORT TERM GOAL #4   Title Pt to be able to demonstrate and verbalize the importance of proper standing and sitting posture in back care to decrease stress thus decreasing future degeneration.    Time 4   Period Weeks   Status New           PT Long Term Goals - 06-Oct-2015 1234    PT LONG TERM GOAL #1   Title Pt core and LE strength to  be increased by one grade to allow pt to get in and out of a lower car with ease   Time 8   Period Weeks   Status New   PT LONG TERM GOAL #2   Title Pt  pain level to be no greater than a 3/10 thoughout the day to allow him to ambulate at least 30 mintues a day 4 days a week for improved self back care and to allow commuinty activities including longer grocery shopping tripsl    Time 8   Period Weeks   Status New   PT LONG TERM GOAL #3   Title Pt single leg stance on both legs to be at least 15 seconds to reduce risk of falls    Time 8   Status New   PT LONG TERM GOAL #4   Title Pt to be able to stand for 15 minutes without increased back pain to be able to complete cooking and wash dishes  without increased pain    Time 8   Period Weeks   Status New               Plan - 06-Oct-2015 1140    Clinical Impression Statement Mr. Sibayan is a 60 yo male who has had progressive radicular pain for years that has exacerbated in the past four months.  He has has significant decreased activity and has therefore been referred to skilled physical therapy.  Examination demonstrates decreased core and LE strength, decreased balance, decreased activity tolerance, decreased ROM and increased pain.  He will benefit from skilled PT to address these issues and return him to his maximal state of functioning with the least amount of pain.    Pt will benefit from skilled therapeutic intervention in order to improve on the following deficits Decreased activity tolerance;Decreased balance;Decreased mobility;Decreased range of motion;Decreased strength;Pain;Obesity   Rehab Potential Good   PT Frequency 2x / week   PT Duration 8 weeks   PT Treatment/Interventions Traction;Ultrasound;Gait training;Functional mobility training;Therapeutic activities;Therapeutic exercise;Balance training;Patient/family education;Manual techniques   PT Next Visit Plan begin 3 D hip excursions, standing heel raises, functional squats progress to functional strengthening.           G-Codes - 10-06-2015 1240    Functional Assessment Tool Used foto   Functional Limitation Mobility: Walking and moving around  Mobility: Walking and Moving Around Current Status [phone]) At least 60 percent but less than 80 percent impaired, limited or restricted   Mobility: Walking and Moving Around Goal Status [phone]) At least 40 percent but less than 60 percent impaired, limited or restricted       Problem List Patient Active Problem List   Diagnosis Date Noted  . Back pain with left-sided radiculopathy 06/05/2015  . Hyperlipidemia 04/10/2015  . Benign hypertension 04/10/2015  . Gastritis 01/15/2015  . Abdominal wall bulge  01/15/2015  . Medicare annual wellness visit, subsequent 10/15/2014  . Wrist pain, right 10/15/2014  . Atypical chest pain 07/22/2014  . Hx of adenomatous colonic polyps 02/21/2014  . Candidiasis 11/28/2013  . Allergic rhinitis 07/24/2013  . Cough due to bronchospasm 05/24/2013  . Onychomycosis 05/21/2013  . 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 Back pain with radiation 04/20/2011  . Seborrheic dermatitis 11/20/2010  . Dermatomycosis 11/20/2010  . FATIGUE 04/01/2010  . CARPAL TUNNEL SYNDROME, BILATERAL 03/24/2010  . Hypogonadism in male 01/27/2010  . DEPRESSION/ANXIETY 01/27/2010  . CHF 01/13/2010  . DYSPNEA 01/13/2010  . Type 2 diabetes mellitus with diabetic nephropathy ([place]) 12/12/2009  . MORBID OBESITY 12/12/2009  . Benign hypertension 12/12/2009  . PERIPHERAL VASCULAR INSUFFICIENCY,  LEGS, BILATERAL 12/12/2009   Rayetta Humphrey, PT CLT [phone] 10/01/2015, 12:41 PM  [place] [address] [place], [place], [address] Phone: [phone]   Fax:  [phone]  Name: LAVONE LAUTURE MRN: [id number] Date of Birth: 1955-10-29

## 2015-10-01 NOTE — Telephone Encounter (Signed)
Patient would like to know if any further information has been received from Dr. Merlene Laughter about injection/complaints.

## 2015-10-01 NOTE — Telephone Encounter (Signed)
Dr Merlene Laughter told me that he thought that his office was trying to arrange for the injections, hOWEVER we are also to be checking on this, can you provide me with any new info

## 2015-10-02 NOTE — Telephone Encounter (Signed)
noted 

## 2015-10-04 ENCOUNTER — Ambulatory Visit (HOSPITAL_COMMUNITY): Payer: PPO

## 2015-10-04 DIAGNOSIS — M6281 Muscle weakness (generalized): Secondary | ICD-10-CM

## 2015-10-04 DIAGNOSIS — R2689 Other abnormalities of gait and mobility: Secondary | ICD-10-CM

## 2015-10-04 DIAGNOSIS — M5442 Lumbago with sciatica, left side: Secondary | ICD-10-CM

## 2015-10-04 NOTE — Therapy (Addendum)
[place], [place], [address] Phone: [phone]   Fax:  [phone]  Physical Therapy Treatment  Patient Details  Name: Nicholas [name] MRN: [id number] Date of Birth: 12/19/1955 Referring Provider: Arther Abbott  Encounter Date: 10/04/2015      PT End of Session - 10/04/15 1358    Visit Number 2   Number of Visits 16   Date for PT Re-Evaluation 11/10/15   Authorization Type [employer]    Authorization - Visit Number 2   Authorization - Number of Visits 10   PT Start Time 1350   PT Stop Time 1428   PT Time Calculation (min) 38 min   Activity Tolerance Patient tolerated treatment well   Behavior During Therapy [facility] for tasks assessed/performed      Past Medical History  Diagnosis Date  . Diabetes ([place])   . Hyperlipidemia   . Testosterone deficiency   . Hypertension   . Poor circulation     leg   . MRSA (methicillin resistant Staphylococcus aureus)   . Chronic knee pain   . Depression   . Glaucoma   . Suicide attempt [facility])     Past Surgical History  Procedure Laterality Date  . Kidney stones      cysto with litholapexy  . Colonoscopy  03/2008    DEY:[id number] external hemorrhoidal tag, otherwise normal rectum/Two diminutive rectosigmoid polyps s/p bx/ Polyp in the opposite the ileocecal valve s/p bx. tubular adenoma.  . Colonoscopy N/A 03/23/2014    [id number] colon polyps throughout his rectum and colon. Sessile and pedunculated. The largest polyp was proximally 6 mm in dimensions in the retum at 3 cm with adjacent diminutive polyp.  There was also a polyps on ileocecal valve, ascending segmen descending and sigmoid segment. as the colon was tortuous & elongated requiring externa abd pressure and changing of the pts postion to reach cecum  . Esophagogastroduodenoscopy N/A 03/23/2014    RMR: 1. Patient had circumferential distal esophageal erosions within 5 mm of the GE junction.  No  Barrett's esophagus. Tubular esophagus patent throughout its course. 2.  Diffuse gastric erosions. (1) 64m area of healing ulceration in the antrum. No ulcer or infiltrating process observed. Patent pylorus. Normal-appearing first and second portion of the duodenum .  .Marland KitchenCholecystectomy N/A 02/25/2015    Procedure: LAPAROSCOPIC CHOLECYSTECTOMY;  Surgeon: MAviva Signs MD;  Location: [facility];  Service: General;  Laterality: N/A;    There were no vitals filed for this visit.      Subjective Assessment - 10/04/15 1348    Subjective Pt reports compliance wth HEP, no questions with exercises.  Pain scale for lower back 3/10 no reports of radicular symptoms.   Pertinent History HTM , DM   Patient Stated Goals sleeps in a recliner unable to lie supine    Currently in Pain? Yes   Pain Score 3    Pain Location Back   Pain Orientation Lower   Pain Descriptors / Indicators Sore;Aching   Pain Type Chronic pain   Pain Radiating Towards no radicular symptoms this session   Pain Onset More than a month ago   Pain Frequency Constant   Aggravating Factors  activity   Pain Relieving Factors sitting in recliner and relaxing            [facility] Adult PT Treatment/Exercise - 10/04/15 0001    Lumbar Exercises: Standing   Heel Raises 10 reps   Heel Raises Limitations toe raises  10x   Other Standing Lumbar Exercises 3D hip excursion 10x   Lumbar Exercises: Seated   Other Seated Lumbar Exercises cervical and scapular retraction; abdominal and glut sets all x 10    Other Seated Lumbar Exercises hip adduction x 10              PT Short Term Goals - 10/01/15 1228    PT SHORT TERM GOAL #1   Title Pt bilateral lower extremity and core strength to be increased at least 1/2 grade to allow pt to be able to come sit to stand without the use of his UE without losing his balance.    Time 3   Period Weeks   Status New   PT SHORT TERM GOAL #2   Title Pt pain level to be decreased to no greater than a 5/10  to allow pt to ambulate for at least 15 minutes to be able to complete short shopping trips and to be able to go to church   Time 4   Period Weeks   Status New   PT SHORT TERM GOAL #3   Title Pt to be able to single leg stance for at least 10 seconds on both legs to reduce risk of falling.    Time 4   Period Weeks   Status New   PT SHORT TERM GOAL #4   Title Pt to be able to demonstrate and verbalize the importance of proper standing and sitting posture in back care to decrease stress thus decreasing future degeneration.    Time 4   Period Weeks   Status New           PT Long Term Goals - 10/01/15 1234    PT LONG TERM GOAL #1   Title Pt core and LE strength to  be increased by one grade to allow pt to get in and out of a lower car with ease   Time 8   Period Weeks   Status New   PT LONG TERM GOAL #2   Title Pt  pain level to be no greater than a 3/10 thoughout the day to allow him to ambulate at least 30 mintues a day 4 days a week for improved self back care and to allow commuinty activities.    Time 8   Period Weeks   Status New   PT LONG TERM GOAL #3   Title Pt single leg stance on both legs to be at least 15 seconds to reduce risk of falls    Time 8   Status New   PT LONG TERM GOAL #4   Title Pt to be able to stand for 15 minutes without increased back pain to be able to complete cooking and wash dishes without increased pain    Time 8   Period Weeks   Status New               Plan - 10/04/15 1359    Clinical Impression Statement Reviewed goals, compliance with HEP and reviewed technqiue, copy of eval given.  Min cueing for proper technqiue and to continue breathing through core strengtheing exercises.  Progressed to standing LE strengthening exercises with therapist facilitation for proper form wtih all exercises.  Pt educated on importance of proper posture in sitting and standing and reports relief in back.  No reports of increased pain throuigh session.      Rehab Potential Good   PT Frequency 2x / week   PT Duration  8 weeks   PT Treatment/Interventions Traction;Ultrasound;Gait training;Functional mobility training;Therapeutic activities;Therapeutic exercise;Balance training;Patient/family education;Manual techniques   PT Next Visit Plan Continue 3 D hip excursions, standing heel raises, functional squats progress to functional strengthening.       Patient will benefit from skilled therapeutic intervention in order to improve the following deficits and impairments:  Decreased activity tolerance, Decreased balance, Decreased mobility, Decreased range of motion, Decreased strength, Pain, Obesity  Visit Diagnosis: Left-sided low back pain with left-sided sciatica  Muscle weakness (generalized)  Other abnormalities of gait and mobility     Problem List Patient Active Problem List   Diagnosis Date Noted  . Back pain with left-sided radiculopathy 06/05/2015  . Hyperlipidemia 04/10/2015  . Benign hypertension 04/10/2015  . Gastritis 01/15/2015  . Abdominal wall bulge 01/15/2015  . Medicare annual wellness visit, subsequent 10/15/2014  . Wrist pain, right 10/15/2014  . Atypical chest pain 07/22/2014  . Hx of adenomatous colonic polyps 02/21/2014  . Candidiasis 11/28/2013  . Allergic rhinitis 07/24/2013  . Cough due to bronchospasm 05/24/2013  . Onychomycosis 05/21/2013  . 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 Back pain with radiation 04/20/2011  . Seborrheic dermatitis 11/20/2010  . Dermatomycosis 11/20/2010  . FATIGUE 04/01/2010  . CARPAL TUNNEL SYNDROME, BILATERAL 03/24/2010  . Hypogonadism in male 01/27/2010  . DEPRESSION/ANXIETY 01/27/2010  . CHF 01/13/2010  . DYSPNEA 01/13/2010  . Type 2 diabetes mellitus with diabetic nephropathy ([place]) 12/12/2009  . MORBID OBESITY 12/12/2009  . Benign hypertension 12/12/2009  . PERIPHERAL VASCULAR INSUFFICIENCY,  LEGS, BILATERAL 12/12/2009   Ihor Austin,  LPTA; CBIS [phone]  10/04/2015, 6:22 PM  [facility] [address] [place], [place], [address] Phone: [phone]   Fax:  [phone]  Name: Nicholas [name] MRN: [id number] Date of Birth: 1955/11/02    PHYSICAL THERAPY DISCHARGE SUMMARY  Visits from Start of Care: 2  Current functional level related to goals / functional outcomes: Unknown as pt did not return for future visits   Remaining deficits: unknown   Education / Equipment: HEP  Plan: Patient agrees to discharge.  Patient goals were not met. Patient is being discharged due to not returning since the last visit.  ?????    ]

## 2015-10-07 ENCOUNTER — Encounter: Payer: Self-pay | Admitting: Family Medicine

## 2015-10-07 ENCOUNTER — Ambulatory Visit (INDEPENDENT_AMBULATORY_CARE_PROVIDER_SITE_OTHER): Payer: PPO | Admitting: Family Medicine

## 2015-10-07 VITALS — BP 136/68 | HR 86 | Resp 16 | Ht 72.0 in | Wt >= 6400 oz

## 2015-10-07 DIAGNOSIS — E785 Hyperlipidemia, unspecified: Secondary | ICD-10-CM

## 2015-10-07 DIAGNOSIS — I10 Essential (primary) hypertension: Secondary | ICD-10-CM

## 2015-10-07 DIAGNOSIS — E559 Vitamin D deficiency, unspecified: Secondary | ICD-10-CM

## 2015-10-07 DIAGNOSIS — L84 Corns and callosities: Secondary | ICD-10-CM

## 2015-10-07 DIAGNOSIS — Z23 Encounter for immunization: Secondary | ICD-10-CM

## 2015-10-07 DIAGNOSIS — L989 Disorder of the skin and subcutaneous tissue, unspecified: Secondary | ICD-10-CM | POA: Diagnosis not present

## 2015-10-07 DIAGNOSIS — Z808 Family history of malignant neoplasm of other organs or systems: Secondary | ICD-10-CM | POA: Diagnosis not present

## 2015-10-07 DIAGNOSIS — F341 Dysthymic disorder: Secondary | ICD-10-CM

## 2015-10-07 DIAGNOSIS — Z114 Encounter for screening for human immunodeficiency virus [HIV]: Secondary | ICD-10-CM

## 2015-10-07 DIAGNOSIS — E1121 Type 2 diabetes mellitus with diabetic nephropathy: Secondary | ICD-10-CM

## 2015-10-07 DIAGNOSIS — E11628 Type 2 diabetes mellitus with other skin complications: Secondary | ICD-10-CM

## 2015-10-07 DIAGNOSIS — M5410 Radiculopathy, site unspecified: Secondary | ICD-10-CM

## 2015-10-07 DIAGNOSIS — Z1159 Encounter for screening for other viral diseases: Secondary | ICD-10-CM

## 2015-10-07 NOTE — Progress Notes (Signed)
Subjective:    Patient ID: Nicholas [name], male    DOB: Sep 22, 1955, 60 y.o.   MRN: [id number]  HPI   Nicholas [name]     MRN: [id number]      DOB: 1955-12-18   HPI [name] is here for follow up and re-evaluation of chronic medical conditions, medication management and review of any available recent lab and radiology data.  Preventive health is updated, specifically  Cancer screening and Immunization.   Questions or concerns regarding consultations or procedures which the PT has had in the interim are  Addressed.very pleased with physical therapy for back pain, feels as though he is "finally doing something to help his back" Not using pain meds and does not want to. Marked improvement in his blood sugar also , and motivated to returning to the [facility] The PT denies any adverse reactions to current medications since the last visit.  Wants skin on back checked per ortho recommendation  ROS Denies recent fever or chills. Denies sinus pressure, nasal congestion, ear pain or sore throat. Denies chest congestion, productive cough or wheezing. Denies chest pains, palpitations and leg swelling Denies abdominal pain, nausea, vomiting,diarrhea or constipation.   Denies dysuria, frequency, hesitancy or incontinence. Improved back pain and limitation in mobility. Denies headaches, seizures, numbness, or tingling. Denies uncontrolled  depression, anxiety or insomnia. Denies skin break down or rash.Multiple lesions on back, twin has skin cancer   PE  BP 136/68 mmHg  Pulse 86  Resp 16  Ht 6' (1.829 m)  Wt 436 lb (197.768 kg)  BMI 59.12 kg/m2  SpO2 93%  Patient alert and oriented and in no cardiopulmonary distress.  HEENT: No facial asymmetry, EOMI,   oropharynx pink and moist.  Neck supple no JVD, no mass.  Chest: Clear to auscultation bilaterally.  CVS: S1, S2 no murmurs, no S3.Regular rate.  ABD: Soft non tender.   Ext: No edema  MS: Decreased ROM lumbar  Spine adequate  in shoulders, hips and knees.  Skin: Intact, no ulcerations or rash noted.multiple hyperpigmented lesions on back, some irregular in color as well as border Psych: Good eye contact, normal affect. Memory intact not anxious or depressed appearing.  CNS: CN 2-12 intact, power,  normal throughout.no focal deficits noted.   Assessment & Plan  Skin lesion of back Multiple hyperpigmented skin lesions, f/h of skin cancer in twin, refer to dermatology  Hyperlipidemia Hyperlipidemia:Low fat diet discussed . Updated lab needed     Lipid Panel  Lab Results  Component Value Date   CHOL 168 02/13/2015   HDL 41 02/13/2015   LDLCALC 80 02/13/2015   TRIG 236* 02/13/2015   CHOLHDL 4.1 02/13/2015        Benign hypertension Controlled, no change in medication DASH diet and commitment to daily physical activity for a minimum of 30 minutes discussed and encouraged, as a part of hypertension management. The importance of attaining a healthy weight is also discussed.  BP/Weight 10/07/2015 09/04/2015 07/11/2015 06/05/2015 04/17/2015 04/10/2015 123456  Systolic BP XX123456 Q000111Q 123XX123 [id number] 0000000 0000000  Diastolic BP 68 68 75 80 66 85 61  Wt. (Lbs) 436 428 433 436 444.8 447 454  BMI 59.12 58.03 58.71 59.12 60.31 64.14 61.56        Type 2 diabetes mellitus with diabetic nephropathy Marked improvement, treated by endo  Back pain with left-sided radiculopathy Evaluated and managed by ortho, reports excellent results with PT , which he intends to continue, and he also plans  to start going back to the [facility]  DEPRESSION/ANXIETY Controlled, no change in medication Marked improvement at this visit in the patient's overall wellbeinng Invested in his health and improvement in it  Benign hypertension Controlled, no change in medication DASH diet and commitment to daily physical activity for a minimum of 30 minutes discussed and encouraged, as a part of hypertension management. The importance of attaining  a healthy weight is also discussed.  BP/Weight 10/07/2015 09/04/2015 07/11/2015 06/05/2015 04/17/2015 04/10/2015 123456  Systolic BP XX123456 Q000111Q 123XX123 [id number] 0000000 0000000  Diastolic BP 68 68 75 80 66 85 61  Wt. (Lbs) 436 428 433 436 444.8 447 454  BMI 59.12 58.03 58.71 59.12 60.31 64.14 61.56        MORBID OBESITY Deteriorated. Patient re-educated about  the importance of commitment to a  minimum of 150 minutes of exercise per week.  The importance of healthy food choices with portion control discussed. Encouraged to start a food diary, count calories and to consider  joining a support group. Sample diet sheets offered. Goals set by the patient for the next several months.   Weight /BMI 10/07/2015 09/04/2015 07/11/2015  WEIGHT 436 lb 428 lb 433 lb  HEIGHT 6\' 0"  6\' 0"  6\' 0"   BMI 59.12 kg/m2 58.03 kg/m2 58.71 kg/m2    Current exercise per week 30 minutes.       Review of Systems     Objective:   Physical Exam        Assessment & Plan:

## 2015-10-07 NOTE — Assessment & Plan Note (Signed)
Evaluated and managed by ortho, reports excellent results with PT , which he intends to continue, and he also plans to start going back to the [facility]

## 2015-10-07 NOTE — Assessment & Plan Note (Signed)
Controlled, no change in medication Marked improvement at this visit in the patient's overall wellbeinng Invested in his health and improvement in it

## 2015-10-07 NOTE — Assessment & Plan Note (Addendum)
Multiple hyperpigmented skin lesions, f/h of skin cancer in twin, refer to dermatology

## 2015-10-07 NOTE — Assessment & Plan Note (Signed)
Controlled, no change in medication DASH diet and commitment to daily physical activity for a minimum of 30 minutes discussed and encouraged, as a part of hypertension management. The importance of attaining a healthy weight is also discussed.  BP/Weight 10/07/2015 09/04/2015 07/11/2015 06/05/2015 04/17/2015 04/10/2015 123456  Systolic BP XX123456 Q000111Q 123XX123 [id number] 0000000 0000000  Diastolic BP 68 68 75 80 66 85 61  Wt. (Lbs) 436 428 433 436 444.8 447 454  BMI 59.12 58.03 58.71 59.12 60.31 64.14 61.56

## 2015-10-07 NOTE — Patient Instructions (Addendum)
Annual wellness in September  Fasting lipid, Hep C, HIV, lipid, vit D, hepatitis panel as soon as possible  Congrats on improvement in overall health  You are referred to Dr Nevada Crane to check on your skin   Thankful back is doing better with physical therapy  Work on plant based diet  Pneumonia 23 today  Discuss seeing nutritionist with Dr Dorris Fetch when you see him this week  Thank you  for choosing [facility]. We consider it a privelige to serve you.  Delivering excellent health care in a caring and  compassionate way is our goal.  Partnering with you,  so that together we can achieve this goal is our strategy.

## 2015-10-07 NOTE — Assessment & Plan Note (Addendum)
Hyperlipidemia:Low fat diet discussed . Updated lab needed     Lipid Panel  Lab Results  Component Value Date   CHOL 168 02/13/2015   HDL 41 02/13/2015   LDLCALC 80 02/13/2015   TRIG 236* 02/13/2015   CHOLHDL 4.1 02/13/2015

## 2015-10-07 NOTE — Assessment & Plan Note (Signed)
Marked improvement, treated by endo

## 2015-10-08 ENCOUNTER — Encounter: Payer: Self-pay | Admitting: Sports Medicine

## 2015-10-08 ENCOUNTER — Other Ambulatory Visit: Payer: Self-pay | Admitting: Family Medicine

## 2015-10-08 ENCOUNTER — Ambulatory Visit (INDEPENDENT_AMBULATORY_CARE_PROVIDER_SITE_OTHER): Payer: PPO | Admitting: Sports Medicine

## 2015-10-08 VITALS — BP 111/62 | HR 82 | Resp 16

## 2015-10-08 DIAGNOSIS — L89891 Pressure ulcer of other site, stage 1: Secondary | ICD-10-CM | POA: Diagnosis not present

## 2015-10-08 DIAGNOSIS — E11621 Type 2 diabetes mellitus with foot ulcer: Secondary | ICD-10-CM

## 2015-10-08 DIAGNOSIS — L97519 Non-pressure chronic ulcer of other part of right foot with unspecified severity: Principal | ICD-10-CM

## 2015-10-08 DIAGNOSIS — I739 Peripheral vascular disease, unspecified: Secondary | ICD-10-CM

## 2015-10-08 DIAGNOSIS — M79676 Pain in unspecified toe(s): Secondary | ICD-10-CM

## 2015-10-08 DIAGNOSIS — E1149 Type 2 diabetes mellitus with other diabetic neurological complication: Secondary | ICD-10-CM

## 2015-10-08 NOTE — Assessment & Plan Note (Signed)
Controlled, no change in medication DASH diet and commitment to daily physical activity for a minimum of 30 minutes discussed and encouraged, as a part of hypertension management. The importance of attaining a healthy weight is also discussed.  BP/Weight 10/07/2015 09/04/2015 07/11/2015 06/05/2015 04/17/2015 04/10/2015 123456  Systolic BP XX123456 Q000111Q 123XX123 [id number] 0000000 0000000  Diastolic BP 68 68 75 80 66 85 61  Wt. (Lbs) 436 428 433 436 444.8 447 454  BMI 59.12 58.03 58.71 59.12 60.31 64.14 61.56

## 2015-10-08 NOTE — Progress Notes (Signed)
Patient ID: MALIKK SIMRELL, male   DOB: 16-May-1956, 60 y.o.   MRN: [id number]  Subjective: LORENZ KERSTETTER is a 60 y.o. male patient seen in office for follow up evaluation of ulceration of the right big toe; states that he has been changing dressing daily with triple antibiotic without problems. Patient has a history of diabetes and a blood glucose level today of 150 mg/dl. Denies nausea/fever/vomiting/chills/night sweats/shortness of breath/pain. Patient has no other pedal complaints at this time.  Patient Active Problem List   Diagnosis Date Noted  . Skin lesion of back 10/07/2015  . Back pain with left-sided radiculopathy 06/05/2015  . Hyperlipidemia 04/10/2015  . Benign hypertension 04/10/2015  . Abdominal wall bulge 01/15/2015  . Medicare annual wellness visit, subsequent 10/15/2014  . Wrist pain, right 10/15/2014  . Atypical chest pain 07/22/2014  . Hx of adenomatous colonic polyps 02/21/2014  . Candidiasis 11/28/2013  . Allergic rhinitis 07/24/2013  . Cough due to bronchospasm 05/24/2013  . Onychomycosis 05/21/2013  . Type 2 diabetes mellitus with pressure callus ([place]) 05/21/2013  . Back pain with radiation 04/20/2011  . Seborrheic dermatitis 11/20/2010  . Dermatomycosis 11/20/2010  . FATIGUE 04/01/2010  . CARPAL TUNNEL SYNDROME, BILATERAL 03/24/2010  . Hypogonadism in male 01/27/2010  . DEPRESSION/ANXIETY 01/27/2010  . CHF 01/13/2010  . DYSPNEA 01/13/2010  . Type 2 diabetes mellitus with diabetic nephropathy ([place]) 12/12/2009  . MORBID OBESITY 12/12/2009  . Benign hypertension 12/12/2009  . PERIPHERAL VASCULAR INSUFFICIENCY,  LEGS, BILATERAL 12/12/2009   Current Outpatient Prescriptions on File Prior to Visit  Medication Sig Dispense Refill  . ACCU-CHEK AVIVA PLUS test strip TEST FOUR TIMES A DAY AS DIRECTED. 100 each 5  . Alcohol Swabs (ALCOHOL PREP) 70 % PADS     . aspirin (ASPIRIN LOW DOSE) 81 MG EC tablet Take 81 mg by mouth daily. Take one tablet by mouth once a  day     . azelastine (ASTELIN) 0.1 % nasal spray Place 2 sprays into both nostrils 2 (two) times daily. 30 mL 2  . B-D INS SYR ULTRAFINE 1CC/31G 31G X 5/16" 1 ML MISC USE AS DIRECTED WITH INSULIN VIALS TWICE DAILY. 100 each 2  . B-D INS SYRINGE 0.5CC/31GX5/16 31G X 5/16" 0.5 ML MISC USE TO INJECT THREE TIMES DAILY. 100 each PRN  . baclofen (LIORESAL) 10 MG tablet     . Blood Glucose Calibration (FREESTYLE CONTROL SOLUTION) LIQD     . brimonidine (ALPHAGAN P) 0.1 % SOLN Place 1 drop into both eyes 2 (two) times daily. One drop in each eye two times a day     . brimonidine-timolol (COMBIGAN) 0.2-0.5 % ophthalmic solution Place 1 drop into both eyes every 12 (twelve) hours. One drop in each eye two times a day      . citalopram (CELEXA) 40 MG tablet TAKE 1 TABLET BY MOUTH ONCE A DAY. 30 tablet 2  . clotrimazole-betamethasone (LOTRISONE) cream APPLY TO AFFECTED AREAS TWICE DAILY. 45 g 0  . DULoxetine (CYMBALTA) 30 MG capsule     . furosemide (LASIX) 20 MG tablet Take 1 tablet (20 mg total) by mouth daily. 30 tablet 1  . gabapentin (NEURONTIN) 400 MG capsule     . HUMULIN R U-500 KWIKPEN 500 UNIT/ML kwikpen INJECT UP TO 100 UNITS SUBCUTANEOUSLY 30 MINUTES BEFORE MEALS. 18 mL 3  . ketoconazole (NIZORAL) 2 % shampoo APPLY TOPICALLY TWICE WEEKLY AS DIRECTED. 120 mL 2  . lamoTRIgine (LAMICTAL) 200 MG tablet TAKE 1 TABLET BY MOUTH ONCE  A DAY. 30 tablet 2  . lisinopril (PRINIVIL,ZESTRIL) 5 MG tablet TAKE 1 TABLET BY MOUTH ONCE A DAY. 30 tablet 2  . NOVOFINE 30G X 8 MM MISC USE TWICE DAILY WITH BYETTA 100 each 3  . nystatin (MYCOSTATIN) powder APPLY TO AFFECTED AREA TWICE DAILY FOR 10 DAYS. 60 g 1  . nystatin-triamcinolone ointment (MYCOLOG) APPLY TO AFFECTED AREA TWICE DAILY. 60 g 2  . pantoprazole (PROTONIX) 40 MG tablet TAKE (1) TABLET BY MOUTH TWICE DAILY. 60 tablet 5  . potassium chloride SA (KLOR-CON M20) 20 MEQ tablet Take 1 tablet (20 mEq total) by mouth daily. 30 tablet 1  . simvastatin (ZOCOR)  40 MG tablet TAKE 1 TABLET BY MOUTH ONCE A DAY. 30 tablet 2  . Testosterone (ANDROGEL PUMP TD) Place 75 g onto the skin every morning.    . TRAVATAN Z 0.004 % SOLN ophthalmic solution     . triamcinolone ointment (KENALOG) 0.1 % APPLY TO AFFECTED AREAS TWICE DAILY. 30 g 3  . VICTOZA 18 MG/3ML SOPN INJECT 1.8 MG SUBCUTANEOUSLY ONCE DAILY. 9 mL 2  . Vitamin D, Ergocalciferol, (DRISDOL) 50000 units CAPS capsule TAKE ONE CAPSULE BY MOUTH ONCE WEEKLY. 12 capsule 0   No current facility-administered medications on file prior to visit.   No Known Allergies  Recent Results (from the past 2160 hour(s))  Basic metabolic panel     Status: Abnormal   Collection Time: 10/01/15 12:09 PM  Result Value Ref Range   Sodium 136 135 - 146 mmol/L   Potassium 4.5 3.5 - 5.3 mmol/L   Chloride 105 98 - 110 mmol/L   CO2 25 20 - 31 mmol/L   Glucose, Bld 148 (H) 65 - 99 mg/dL   BUN 17 7 - 25 mg/dL   Creat 1.03 0.70 - 1.25 mg/dL   Calcium 9.1 8.6 - 10.3 mg/dL  TSH     Status: None   Collection Time: 10/01/15 12:09 PM  Result Value Ref Range   TSH 2.30 0.40 - 4.50 mIU/L  T4, free     Status: None   Collection Time: 10/01/15 12:09 PM  Result Value Ref Range   Free T4 1.0 0.8 - 1.8 ng/dL  Hemoglobin A1c     Status: Abnormal   Collection Time: 10/01/15 12:09 PM  Result Value Ref Range   Hgb A1c MFr Bld 6.3 (H) <5.7 %    Comment:   For someone without known diabetes, a hemoglobin A1c value between 5.7% and 6.4% is consistent with prediabetes and should be confirmed with a follow-up test.   For someone with known diabetes, a value <7% indicates that their diabetes is well controlled. A1c targets should be individualized based on duration of diabetes, age, co-morbid conditions and other considerations.   This assay result is consistent with an increased risk of diabetes.   Currently, no consensus exists regarding use of hemoglobin A1c for diagnosis of diabetes in children.      Mean Plasma Glucose 134  mg/dL    Objective: There were no vitals filed for this visit.  General: Patient is awake, alert, oriented x 3 and in no acute distress.  Dermatology: Skin is warm and dry bilateral with a partial thickness ulceration present plantar right hallux. Ulceration measures 0.5x0.4x0.1cm (last measurement 1cm x 0.8cm x 0.1 cm). There is a keratotic border with a granular base. The ulceration does not probe to bone. There is no malodor, no active drainage, no erythema, no edema. No acute signs of infection. Nails  are short and thickened x 10 with no signs of ingrowing or infection.    Vascular: Dorsalis Pedis pulse = 0/4 Bilateral,  Posterior Tibial pulse = 1/4 Bilateral faint,  Capillary Fill Time < 5 seconds, + chronic venous skin changes bilateral   Neurologic:Protective sensation diminished using the 5.07/10g Semmes Weinstein Monofilament. Vibratory diminished bilateral.   Musculosketal:  No Pain with palpation to ulcerated area. No pain with compression to calves bilateral.   Assessment and Plan:  Problem List Items Addressed This Visit    None    Visit Diagnoses    Diabetic ulcer of right foot associated with type 2 diabetes mellitus ([place])    -  Primary    PVD (peripheral vascular disease) ([place])        Type II diabetes mellitus with neurological manifestations ([place])        Pain of toe, unspecified laterality          -Examined patient and discussed the progression of the wound and treatment alternatives. -Excisionally dedbrided ulceration to healthy bleeding borders using a sterile chisel blade. -Applied topical antibiotic and dry sterile dressing and instructed patient to continue with daily dressings at home consisting of triple antibiotic and bandaid/dry sterile dressing. -Advised patient to return to wound healing shoe to assist with offloading the area and encourage healing -Will consider repeat xrays, vascular consult if fails to continue to improve -Advised patient to go to the  ER or return to office if the wound worsens or if constitutional symptoms are present. -Patient to return to office in 2-3 weeks for follow up care and evaluation or sooner if problems arise.Will also trim nails at next visit.   Landis Martins, DPM

## 2015-10-08 NOTE — Assessment & Plan Note (Signed)
Deteriorated. Patient re-educated about  the importance of commitment to a  minimum of 150 minutes of exercise per week.  The importance of healthy food choices with portion control discussed. Encouraged to start a food diary, count calories and to consider  joining a support group. Sample diet sheets offered. Goals set by the patient for the next several months.   Weight /BMI 10/07/2015 09/04/2015 07/11/2015  WEIGHT 436 lb 428 lb 433 lb  HEIGHT 6\' 0"  6\' 0"  6\' 0"   BMI 59.12 kg/m2 58.03 kg/m2 58.71 kg/m2    Current exercise per week 30 minutes.

## 2015-10-09 ENCOUNTER — Encounter (HOSPITAL_COMMUNITY): Payer: PPO

## 2015-10-10 ENCOUNTER — Ambulatory Visit (INDEPENDENT_AMBULATORY_CARE_PROVIDER_SITE_OTHER): Payer: PPO | Admitting: "Endocrinology

## 2015-10-10 ENCOUNTER — Encounter: Payer: Self-pay | Admitting: "Endocrinology

## 2015-10-10 VITALS — BP 146/70 | HR 88 | Ht 72.0 in | Wt >= 6400 oz

## 2015-10-10 DIAGNOSIS — E785 Hyperlipidemia, unspecified: Secondary | ICD-10-CM | POA: Diagnosis not present

## 2015-10-10 DIAGNOSIS — E1121 Type 2 diabetes mellitus with diabetic nephropathy: Secondary | ICD-10-CM | POA: Diagnosis not present

## 2015-10-10 DIAGNOSIS — I10 Essential (primary) hypertension: Secondary | ICD-10-CM

## 2015-10-10 NOTE — Patient Instructions (Signed)

## 2015-10-10 NOTE — Progress Notes (Signed)
Subjective:    Patient ID: Nicholas [name], male    DOB: [birth year],    Past Medical History  Diagnosis Date  . Diabetes ([place])   . Hyperlipidemia   . Testosterone deficiency   . Hypertension   . Poor circulation     leg   . MRSA (methicillin resistant Staphylococcus aureus)   . Chronic knee pain   . Depression   . Glaucoma   . Suicide attempt [facility])    Past Surgical History  Procedure Laterality Date  . Kidney stones      cysto with litholapexy  . Colonoscopy  03/2008    [id number] external hemorrhoidal tag, otherwise normal rectum/Two diminutive rectosigmoid polyps s/p bx/ Polyp in the opposite the ileocecal valve s/p bx. tubular adenoma.  . Colonoscopy N/A 03/23/2014    [id number] colon polyps throughout his rectum and colon. Sessile and pedunculated. The largest polyp was proximally 6 mm in dimensions in the retum at 3 cm with adjacent diminutive polyp.  There was also a polyps on ileocecal valve, ascending segmen descending and sigmoid segment. as the colon was tortuous & elongated requiring externa abd pressure and changing of the pts postion to reach cecum  . Esophagogastroduodenoscopy N/A 03/23/2014    RMR: 1. Patient had circumferential distal esophageal erosions within 5 mm of the GE junction.  No Barrett's esophagus. Tubular esophagus patent throughout its course. 2.  Diffuse gastric erosions. (1) 58mm area of healing ulceration in the antrum. No ulcer or infiltrating process observed. Patent pylorus. Normal-appearing first and second portion of the duodenum .  Marland Kitchen Cholecystectomy N/A 02/25/2015    Procedure: LAPAROSCOPIC CHOLECYSTECTOMY;  Surgeon: Aviva Signs, MD;  Location: [facility];  Service: General;  Laterality: N/A;   Social History   Social History  . Marital Status: Divorced    Spouse Name: N/A  . Number of Children: 0  . Years of Education: 12   Occupational History  . DISABLED   .     Social History Main Topics  . Smoking status: Former Smoker -- 1.50  packs/day for 25 years    Types: Cigarettes  . Smokeless tobacco: Former Systems developer    Quit date: 02/20/2002     Comment: Quit x 13 years  . Alcohol Use: No  . Drug Use: No  . Sexual Activity: Not Currently    Birth Control/ Protection: None   Other Topics Concern  . None   Social History Narrative   Outpatient Encounter Prescriptions as of 10/10/2015  Medication Sig  . Insulin Regular Human (HUMULIN R U-500, CONCENTRATED, [place]) Inject 80 Units into the skin 3 (three) times daily before meals.  Marland Kitchen ACCU-CHEK AVIVA PLUS test strip TEST FOUR TIMES A DAY AS DIRECTED.  Marland Kitchen Alcohol Swabs (ALCOHOL PREP) 70 % PADS   . aspirin (ASPIRIN LOW DOSE) 81 MG EC tablet Take 81 mg by mouth daily. Take one tablet by mouth once a day   . azelastine (ASTELIN) 0.1 % nasal spray Place 2 sprays into both nostrils 2 (two) times daily.  . B-D INS SYR ULTRAFINE 1CC/31G 31G X 5/16" 1 ML MISC USE AS DIRECTED WITH INSULIN VIALS TWICE DAILY.  Marland Kitchen B-D INS SYRINGE 0.5CC/31GX5/16 31G X 5/16" 0.5 ML MISC USE TO INJECT THREE TIMES DAILY.  . baclofen (LIORESAL) 10 MG tablet   . Blood Glucose Calibration (FREESTYLE CONTROL SOLUTION) LIQD   . brimonidine (ALPHAGAN P) 0.1 % SOLN Place 1 drop into both eyes 2 (two) times daily. One drop in each  eye two times a day   . brimonidine-timolol (COMBIGAN) 0.2-0.5 % ophthalmic solution Place 1 drop into both eyes every 12 (twelve) hours. One drop in each eye two times a day    . citalopram (CELEXA) 40 MG tablet TAKE 1 TABLET BY MOUTH ONCE A DAY.  . clotrimazole-betamethasone (LOTRISONE) cream APPLY TO AFFECTED AREAS TWICE DAILY.  . DULoxetine (CYMBALTA) 30 MG capsule   . furosemide (LASIX) 20 MG tablet Take 1 tablet (20 mg total) by mouth daily.  Marland Kitchen gabapentin (NEURONTIN) 400 MG capsule   . ketoconazole (NIZORAL) 2 % shampoo APPLY TOPICALLY TWICE WEEKLY AS DIRECTED.  Marland Kitchen lamoTRIgine (LAMICTAL) 200 MG tablet TAKE 1 TABLET BY MOUTH ONCE A DAY.  Marland Kitchen lisinopril (PRINIVIL,ZESTRIL) 5 MG tablet TAKE 1  TABLET BY MOUTH ONCE A DAY.  Marland Kitchen NOVOFINE 30G X 8 MM MISC USE TWICE DAILY WITH BYETTA  . nystatin (MYCOSTATIN) powder APPLY TO AFFECTED AREA TWICE DAILY FOR 10 DAYS.  Marland Kitchen nystatin-triamcinolone ointment (MYCOLOG) APPLY TO AFFECTED AREA TWICE DAILY.  . pantoprazole (PROTONIX) 40 MG tablet TAKE (1) TABLET BY MOUTH TWICE DAILY.  Marland Kitchen potassium chloride SA (KLOR-CON M20) 20 MEQ tablet Take 1 tablet (20 mEq total) by mouth daily.  . simvastatin (ZOCOR) 40 MG tablet TAKE 1 TABLET BY MOUTH ONCE A DAY.  Marland Kitchen Testosterone (ANDROGEL PUMP TD) Place 75 g onto the skin every morning.  . TRAVATAN Z 0.004 % SOLN ophthalmic solution   . triamcinolone ointment (KENALOG) 0.1 % APPLY TO AFFECTED AREAS TWICE DAILY.  Marland Kitchen VICTOZA 18 MG/3ML SOPN INJECT 1.8 MG SUBCUTANEOUSLY ONCE DAILY.  Marland Kitchen Vitamin D, Ergocalciferol, (DRISDOL) 50000 units CAPS capsule TAKE ONE CAPSULE BY MOUTH ONCE WEEKLY.  . [DISCONTINUED] HUMULIN R U-500 KWIKPEN 500 UNIT/ML kwikpen INJECT UP TO 100 UNITS SUBCUTANEOUSLY 30 MINUTES BEFORE MEALS.   No facility-administered encounter medications on file as of 10/10/2015.   ALLERGIES: No Known Allergies VACCINATION STATUS: Immunization History  Administered Date(s) Administered  . Influenza Whole 04/01/2010  . Influenza,inj,Quad PF,36+ Mos 03/08/2013  . Pneumococcal Conjugate-13 07/09/2014  . Pneumococcal Polysaccharide-23 04/01/2010, 10/07/2015  . Tdap 11/24/2011  . Zoster 12/12/2014    Diabetes He presents for his follow-up diabetic visit. He has type 2 diabetes mellitus. His disease course has been improving. There are no hypoglycemic associated symptoms. Pertinent negatives for hypoglycemia include no confusion, headaches, pallor or seizures. There are no diabetic associated symptoms. Pertinent negatives for diabetes include no chest pain, no fatigue, no polydipsia, no polyphagia, no polyuria and no weakness. There are no hypoglycemic complications. Symptoms are improving. There are no diabetic  complications. He is compliant with treatment most of the time. His weight is stable. He is following a generally unhealthy diet. He rarely participates in exercise. His home blood glucose trend is decreasing steadily. His breakfast blood glucose range is generally 130-140 mg/dl. His lunch blood glucose range is generally 130-140 mg/dl. His dinner blood glucose range is generally 130-140 mg/dl. His overall blood glucose range is 130-140 mg/dl. An ACE inhibitor/angiotensin II receptor blocker is being taken.  Hyperlipidemia This is a chronic problem. The current episode started more than 1 year ago. Exacerbating diseases include diabetes and obesity. Pertinent negatives include no chest pain, myalgias or shortness of breath. Current antihyperlipidemic treatment includes statins. Risk factors for coronary artery disease include a sedentary lifestyle, male sex, diabetes mellitus, dyslipidemia and obesity.     Review of Systems  Constitutional: Negative for fatigue and unexpected weight change.  HENT: Negative for dental problem, mouth sores and  trouble swallowing.   Eyes: Negative for visual disturbance.  Respiratory: Negative for cough, choking, chest tightness, shortness of breath and wheezing.   Cardiovascular: Negative for chest pain, palpitations and leg swelling.  Gastrointestinal: Negative for nausea, vomiting, abdominal pain, diarrhea, constipation and abdominal distention.  Endocrine: Negative for polydipsia, polyphagia and polyuria.  Genitourinary: Negative for dysuria, urgency, hematuria and flank pain.  Musculoskeletal: Negative for myalgias, back pain, gait problem and neck pain.  Skin: Negative for pallor, rash and wound.  Neurological: Negative for seizures, syncope, weakness, numbness and headaches.  Psychiatric/Behavioral: Negative.  Negative for confusion and dysphoric mood.    Objective:    BP 146/70 mmHg  Pulse 88  Ht 6' (1.829 m)  Wt 440 lb (199.583 kg)  BMI 59.66 kg/m2   SpO2 98%  Wt Readings from Last 3 Encounters:  10/10/15 440 lb (199.583 kg)  10/07/15 436 lb (197.768 kg)  09/04/15 428 lb (194.14 kg)    Physical Exam  Constitutional: He is oriented to person, place, and time. He appears well-developed and well-nourished. He is cooperative. No distress.  HENT:  Head: Normocephalic and atraumatic.  Eyes: EOM are normal.  Neck: Normal range of motion. Neck supple. No tracheal deviation present. No thyromegaly present.  Cardiovascular: Normal rate, S1 normal, S2 normal and normal heart sounds.  Exam reveals no gallop.   No murmur heard. Pulses:      Dorsalis pedis pulses are 1+ on the right side, and 1+ on the left side.       Posterior tibial pulses are 1+ on the right side, and 1+ on the left side.  Pulmonary/Chest: Breath sounds normal. No respiratory distress. He has no wheezes.  Abdominal: Soft. Bowel sounds are normal. He exhibits no distension. There is no tenderness. There is no guarding and no CVA tenderness.  Musculoskeletal: He exhibits no edema.       Right shoulder: He exhibits no swelling and no deformity.  Neurological: He is alert and oriented to person, place, and time. He has normal strength and normal reflexes. No cranial nerve deficit or sensory deficit. Gait normal.  Skin: Skin is warm and dry. No rash noted. No cyanosis. Nails show no clubbing.  He has extensive tattoos.  Psychiatric: He has a normal mood and affect. His speech is normal and behavior is normal. Judgment and thought content normal. Cognition and memory are normal.    Results for orders placed or performed in visit on [id number]  Basic metabolic panel  Result Value Ref Range   Sodium 136 135 - 146 mmol/L   Potassium 4.5 3.5 - 5.3 mmol/L   Chloride 105 98 - 110 mmol/L   CO2 25 20 - 31 mmol/L   Glucose, Bld 148 (H) 65 - 99 mg/dL   BUN 17 7 - 25 mg/dL   Creat 1.03 0.70 - 1.25 mg/dL   Calcium 9.1 8.6 - 10.3 mg/dL  TSH  Result Value Ref Range   TSH 2.30 0.40 - 4.50  mIU/L  T4, free  Result Value Ref Range   Free T4 1.0 0.8 - 1.8 ng/dL  Hemoglobin A1c  Result Value Ref Range   Hgb A1c MFr Bld 6.3 (H) <5.7 %   Mean Plasma Glucose 134 mg/dL  Diabetic Labs (most recent): Lab Results  Component Value Date   HGBA1C 6.3* 10/01/2015   HGBA1C 8.0* 07/02/2015   HGBA1C 9.0* 03/08/2015   Lipid Panel     Component Value Date/Time   CHOL 168 02/13/2015 0915  TRIG 236* 02/13/2015 0915   HDL 41 02/13/2015 0915   CHOLHDL 4.1 02/13/2015 0915   VLDL 47* 02/13/2015 0915   LDLCALC 80 02/13/2015 0915    Assessment & Plan:   1. 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  Patient came with Humulin U500 insulin pens for demonstration. His recent A1c of 6.3% generally improving from 9 %.  He is on U500 using 100 units TIDAC. -He did have a few random hypoglycemia into 60s and 50s.  - Patient remains at a high risk for more acute and chronic complications of diabetes which include CAD, CVA, CKD, retinopathy, and neuropathy. These are all discussed in detail with the patient.  - I have re-counseled the patient on diet management and weight loss  by adopting a carbohydrate restricted / protein rich  Diet. - Patient is advised to stick to a routine mealtimes to eat 3 meals  a day and avoid unnecessary snacks ( to snack only to correct hypoglycemia).  - Suggestion is made for patient to avoid simple carbohydrates   from their diet including Cakes , Desserts, Ice Cream,  Soda (  diet and regular) , Sweet Tea , Candies,  Chips, Cookies, Artificial Sweeteners,   and "Sugar-free" Products .  This will help patient to have stable blood glucose profile and potentially avoid unintended  Weight gain.  - The patient  has been  scheduled with Jearld Fenton, RDN, CDE for individualized DM education. - I have approached patient with the following individualized plan to manage diabetes and patient  agrees.  - I will lower his Humulin U500 pen to 80 units 3 times a day before meals for pre-meal blood glucose above 90 mg/dL associated with strict monitoring of glucose  AC and HS. - Patient is warned not to take insulin without proper monitoring per orders.  -Patient is encouraged to call clinic for blood glucose levels less than 70 or above 300 mg /dl. - I will continue Victoza 1.8 mg subcutaneous daily, therapeutically suitable for patient.. -Patient does not tolerate metformin.   - Patient specific target  for A1c; LDL, HDL, Triglycerides, and  Waist Circumference were discussed in detail.  2) BP/HTN: Controlled. Continue current medications including ACEI. 3) Lipids/HPL:  continue statins. 4)  Weight/Diet: CDE consult in progress, exercise, and carbohydrates information provided.  5) Chronic Care/Health Maintenance:  -Patient is on ACEI and Statin medications and encouraged to continue to follow up with Ophthalmology, Podiatrist at least yearly or according to recommendations, and advised to  stay away from smoking. I have recommended yearly flu vaccine and pneumonia vaccination at least every 5 years; moderate intensity exercise for up to 150 minutes weekly; and  sleep for at least 7 hours a day.  I advised patient to maintain close follow up with their PCP for primary care needs.  Patient is asked to bring meter and  blood glucose logs during their next visit.   Follow up plan: Return in about 3 months (around 01/09/2016) for diabetes, high blood pressure, high cholesterol, follow up with pre-visit labs, meter, and logs.  Glade Lloyd, MD Phone: [phone]  Fax: [phone]   10/10/2015, 11:49 AM

## 2015-10-11 ENCOUNTER — Telehealth (HOSPITAL_COMMUNITY): Payer: Self-pay

## 2015-10-11 ENCOUNTER — Ambulatory Visit (HOSPITAL_COMMUNITY): Payer: PPO

## 2015-10-11 NOTE — Telephone Encounter (Signed)
He hurt his foot and can not come in today. NF

## 2015-10-15 ENCOUNTER — Telehealth (HOSPITAL_COMMUNITY): Payer: Self-pay

## 2015-10-15 ENCOUNTER — Ambulatory Visit: Payer: PPO | Admitting: Orthopedic Surgery

## 2015-10-15 ENCOUNTER — Encounter (HOSPITAL_COMMUNITY): Payer: PPO | Admitting: Physical Therapy

## 2015-10-15 NOTE — Telephone Encounter (Signed)
10/15/15 just said he needed to cancel today - no reason given

## 2015-10-16 ENCOUNTER — Telehealth: Payer: Self-pay

## 2015-10-16 ENCOUNTER — Other Ambulatory Visit: Payer: Self-pay | Admitting: *Deleted

## 2015-10-16 NOTE — Patient Outreach (Signed)
[facility]) Care Management  10/16/2015  Nicholas [name] 1956-05-14 [id number]   Referral from [employer] High Risk List:  Telephone call to patient who gave HIPPA verification. Patient was advised of [facility] care management services.  Patient voices that he feels health condition is currently under control. States he has Diabetes 2- insulin dependent . States he is seeing primary care doctor and diabetes specialist every 3 months. Seeing eye doctor yearly and foot doctor as needed. States most recent " Hgb A1C"  level was 6.1 which he and doctor were very pleased with. States he has workable glucometer and is checking blood sugar 4 times daily and recording. States taking insulin & other medications as instructed by doctors. Voicing understanding of importance of medication compliance. States no trouble getting his medications. Drives self to doctor's appointments. Has support from friends if needed. States he has taken diabetic classes and feels comfortable managing his condition. Patient was able to state how to counteract hypoglycemic episode.  Patient voices that he has no major health concerns at this time and does not need case management at this time.   No case management needs assessed at this time.   Plan:  Send contact information to patient. Send MD closure letter. Close case-send to care management assistant.  Sherrin Daisy, RN BSN Centre Management Coordinator [facility] Care Management  [phone]

## 2015-10-16 NOTE — Telephone Encounter (Signed)
Pt.notified

## 2015-10-16 NOTE — Telephone Encounter (Signed)
Pt states they have had high BG readings.   Date Before breakfast Before lunch Before supper Bedtime  4/17 198 135 180 172  4/18 114 139 145 192  4/19 185             Pt taking:  Humulin U-500 80 units tid

## 2015-10-16 NOTE — Telephone Encounter (Signed)
No change, continue the same dose of insulin. I advised patient to call us if he registers greater than 200 time 3.

## 2015-10-18 ENCOUNTER — Encounter: Payer: Self-pay | Admitting: *Deleted

## 2015-10-18 ENCOUNTER — Ambulatory Visit (HOSPITAL_COMMUNITY): Payer: PPO

## 2015-10-18 ENCOUNTER — Telehealth (HOSPITAL_COMMUNITY): Payer: Self-pay

## 2015-10-18 DIAGNOSIS — G5610 Other lesions of median nerve, unspecified upper limb: Secondary | ICD-10-CM | POA: Diagnosis not present

## 2015-10-18 DIAGNOSIS — M79603 Pain in arm, unspecified: Secondary | ICD-10-CM | POA: Diagnosis not present

## 2015-10-18 NOTE — Telephone Encounter (Signed)
10/18/15 he called and said that he would like to cx all appts for this month.  I asked him if he wanted to reschedule or be discharged and he said he would like to reschedule for next month.  I made a note to give him a call May 1

## 2015-10-21 DIAGNOSIS — D225 Melanocytic nevi of trunk: Secondary | ICD-10-CM | POA: Diagnosis not present

## 2015-10-21 DIAGNOSIS — D485 Neoplasm of uncertain behavior of skin: Secondary | ICD-10-CM | POA: Diagnosis not present

## 2015-10-22 ENCOUNTER — Encounter (HOSPITAL_COMMUNITY): Payer: PPO | Admitting: Physical Therapy

## 2015-10-23 ENCOUNTER — Other Ambulatory Visit: Payer: Self-pay | Admitting: "Endocrinology

## 2015-10-24 ENCOUNTER — Encounter (HOSPITAL_COMMUNITY): Payer: PPO | Admitting: Physical Therapy

## 2015-10-29 ENCOUNTER — Encounter: Payer: Self-pay | Admitting: Sports Medicine

## 2015-10-29 ENCOUNTER — Ambulatory Visit (INDEPENDENT_AMBULATORY_CARE_PROVIDER_SITE_OTHER): Payer: PPO | Admitting: Sports Medicine

## 2015-10-29 ENCOUNTER — Other Ambulatory Visit: Payer: Self-pay | Admitting: Family Medicine

## 2015-10-29 VITALS — BP 140/103 | HR 87 | Resp 16

## 2015-10-29 DIAGNOSIS — M79676 Pain in unspecified toe(s): Secondary | ICD-10-CM | POA: Diagnosis not present

## 2015-10-29 DIAGNOSIS — L97519 Non-pressure chronic ulcer of other part of right foot with unspecified severity: Principal | ICD-10-CM

## 2015-10-29 DIAGNOSIS — E1149 Type 2 diabetes mellitus with other diabetic neurological complication: Secondary | ICD-10-CM | POA: Diagnosis not present

## 2015-10-29 DIAGNOSIS — M21829 Other specified acquired deformities of unspecified upper arm: Secondary | ICD-10-CM

## 2015-10-29 DIAGNOSIS — B351 Tinea unguium: Secondary | ICD-10-CM | POA: Diagnosis not present

## 2015-10-29 DIAGNOSIS — I739 Peripheral vascular disease, unspecified: Secondary | ICD-10-CM | POA: Diagnosis not present

## 2015-10-29 DIAGNOSIS — L89891 Pressure ulcer of other site, stage 1: Secondary | ICD-10-CM | POA: Diagnosis not present

## 2015-10-29 DIAGNOSIS — E11621 Type 2 diabetes mellitus with foot ulcer: Secondary | ICD-10-CM

## 2015-10-29 DIAGNOSIS — M2010 Hallux valgus (acquired), unspecified foot: Secondary | ICD-10-CM

## 2015-10-29 NOTE — Progress Notes (Signed)
Patient ID: MANSEL KIRGAN, male   DOB: January 03, 1956, 60 y.o.   MRN: [id number]   Subjective: Nicholas [name] is a 60 y.o. male patient seen in office for nail trim and for follow up evaluation of ulceration of the right big toe; states that he has been changing dressing daily with triple antibiotic without problems. Patient has a history of diabetes and a blood glucose level today of 150 mg/dl. Denies nausea/fever/vomiting/chills/night sweats/shortness of breath/pain. Patient has no other pedal complaints at this time.  Patient also brought his diabetic shoes from last year and states that they are too small.    Patient Active Problem List   Diagnosis Date Noted  . Skin lesion of back 10/07/2015  . Back pain with left-sided radiculopathy 06/05/2015  . Hyperlipidemia 04/10/2015  . Benign hypertension 04/10/2015  . Abdominal wall bulge 01/15/2015  . Medicare annual wellness visit, subsequent 10/15/2014  . Wrist pain, right 10/15/2014  . Atypical chest pain 07/22/2014  . Hx of adenomatous colonic polyps 02/21/2014  . Candidiasis 11/28/2013  . Allergic rhinitis 07/24/2013  . Cough due to bronchospasm 05/24/2013  . Onychomycosis 05/21/2013  . 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 Back pain with radiation 04/20/2011  . Seborrheic dermatitis 11/20/2010  . Dermatomycosis 11/20/2010  . FATIGUE 04/01/2010  . CARPAL TUNNEL SYNDROME, BILATERAL 03/24/2010  . Hypogonadism in male 01/27/2010  . DEPRESSION/ANXIETY 01/27/2010  . CHF 01/13/2010  . DYSPNEA 01/13/2010  . Type 2 diabetes mellitus with diabetic nephropathy ([place]) 12/12/2009  . MORBID OBESITY 12/12/2009  . Benign hypertension 12/12/2009  . PERIPHERAL VASCULAR INSUFFICIENCY,  LEGS, BILATERAL 12/12/2009   Current Outpatient Prescriptions on File Prior to Visit  Medication Sig Dispense Refill  . ACCU-CHEK AVIVA PLUS test strip TEST FOUR TIMES A DAY AS DIRECTED. 100 each 5  . Alcohol Swabs (ALCOHOL PREP) 70  % PADS     . aspirin (ASPIRIN LOW DOSE) 81 MG EC tablet Take 81 mg by mouth daily. Take one tablet by mouth once a day     . azelastine (ASTELIN) 0.1 % nasal spray Place 2 sprays into both nostrils 2 (two) times daily. 30 mL 2  . B-D INS SYR ULTRAFINE 1CC/31G 31G X 5/16" 1 ML MISC USE AS DIRECTED WITH INSULIN VIALS TWICE DAILY. 100 each 2  . B-D INS SYRINGE 0.5CC/31GX5/16 31G X 5/16" 0.5 ML MISC USE TO INJECT THREE TIMES DAILY. 100 each PRN  . baclofen (LIORESAL) 10 MG tablet     . Blood Glucose Calibration (FREESTYLE CONTROL SOLUTION) LIQD     . brimonidine (ALPHAGAN P) 0.1 % SOLN Place 1 drop into both eyes 2 (two) times daily. One drop in each eye two times a day     . brimonidine-timolol (COMBIGAN) 0.2-0.5 % ophthalmic solution Place 1 drop into both eyes every 12 (twelve) hours. One drop in each eye two times a day      . citalopram (CELEXA) 40 MG tablet TAKE 1 TABLET BY MOUTH ONCE A DAY. 30 tablet 2  . clotrimazole-betamethasone (LOTRISONE) cream APPLY TO AFFECTED AREAS TWICE DAILY. 45 g 0  . DULoxetine (CYMBALTA) 30 MG capsule     . furosemide (LASIX) 20 MG tablet Take 1 tablet (20 mg total) by mouth daily. 30 tablet 1  . gabapentin (NEURONTIN) 400 MG capsule     . HUMULIN R U-500 KWIKPEN 500 UNIT/ML kwikpen INJECT UP TO 100 UNITS SUBCUTANEOUSLY 30 MINUTES BEFORE MEALS. 18 mL 2  . Insulin Regular Human (  HUMULIN R U-500, CONCENTRATED, [place]) Inject 80 Units into the skin 3 (three) times daily before meals.    Marland Kitchen ketoconazole (NIZORAL) 2 % shampoo APPLY TOPICALLY TWICE WEEKLY AS DIRECTED. 120 mL 2  . lamoTRIgine (LAMICTAL) 200 MG tablet TAKE 1 TABLET BY MOUTH ONCE A DAY. 30 tablet 2  . lisinopril (PRINIVIL,ZESTRIL) 5 MG tablet TAKE 1 TABLET BY MOUTH ONCE A DAY. 30 tablet 2  . NOVOFINE 30G X 8 MM MISC USE TWICE DAILY WITH BYETTA 100 each 3  . nystatin (MYCOSTATIN) powder APPLY TO AFFECTED AREA TWICE DAILY FOR 10 DAYS. 60 g 1  . nystatin-triamcinolone ointment (MYCOLOG) APPLY TO AFFECTED AREA  TWICE DAILY. 60 g 2  . pantoprazole (PROTONIX) 40 MG tablet TAKE (1) TABLET BY MOUTH TWICE DAILY. 60 tablet 5  . potassium chloride SA (KLOR-CON M20) 20 MEQ tablet Take 1 tablet (20 mEq total) by mouth daily. 30 tablet 1  . simvastatin (ZOCOR) 40 MG tablet TAKE 1 TABLET BY MOUTH ONCE A DAY. 30 tablet 3  . Testosterone (ANDROGEL PUMP TD) Place 75 g onto the skin every morning.    . TRAVATAN Z 0.004 % SOLN ophthalmic solution     . triamcinolone ointment (KENALOG) 0.1 % APPLY TO AFFECTED AREAS TWICE DAILY. 30 g 3  . VICTOZA 18 MG/3ML SOPN INJECT 1.8 MG SUBCUTANEOUSLY ONCE DAILY. 9 mL 2  . Vitamin D, Ergocalciferol, (DRISDOL) 50000 units CAPS capsule TAKE ONE CAPSULE BY MOUTH ONCE WEEKLY. 12 capsule 0   No current facility-administered medications on file prior to visit.   No Known Allergies  Recent Results (from the past 2160 hour(s))  Basic metabolic panel     Status: Abnormal   Collection Time: 10/01/15 12:09 PM  Result Value Ref Range   Sodium 136 135 - 146 mmol/L   Potassium 4.5 3.5 - 5.3 mmol/L   Chloride 105 98 - 110 mmol/L   CO2 25 20 - 31 mmol/L   Glucose, Bld 148 (H) 65 - 99 mg/dL   BUN 17 7 - 25 mg/dL   Creat 1.03 0.70 - 1.25 mg/dL   Calcium 9.1 8.6 - 10.3 mg/dL  TSH     Status: None   Collection Time: 10/01/15 12:09 PM  Result Value Ref Range   TSH 2.30 0.40 - 4.50 mIU/L  T4, free     Status: None   Collection Time: 10/01/15 12:09 PM  Result Value Ref Range   Free T4 1.0 0.8 - 1.8 ng/dL  Hemoglobin A1c     Status: Abnormal   Collection Time: 10/01/15 12:09 PM  Result Value Ref Range   Hgb A1c MFr Bld 6.3 (H) <5.7 %    Comment:   For someone without known diabetes, a hemoglobin A1c value between 5.7% and 6.4% is consistent with prediabetes and should be confirmed with a follow-up test.   For someone with known diabetes, a value <7% indicates that their diabetes is well controlled. A1c targets should be individualized based on duration of diabetes, age, co-morbid  conditions and other considerations.   This assay result is consistent with an increased risk of diabetes.   Currently, no consensus exists regarding use of hemoglobin A1c for diagnosis of diabetes in children.      Mean Plasma Glucose 134 mg/dL    Objective: There were no vitals filed for this visit.  General: Patient is awake, alert, oriented x 3 and in no acute distress.  Dermatology: Skin is warm and dry bilateral with a partial thickness ulceration  present plantar right hallux. Ulceration measures 0.5x0.4x0.1cm (last measurement same). There is a keratotic border with a granular base. The ulceration does not probe to bone. There is no malodor, no active drainage, no erythema, no edema. No acute signs of infection. Nails are long and thickened x 10 with no signs of ingrowing or infection.    Vascular: Dorsalis Pedis pulse = 0/4 Bilateral,  Posterior Tibial pulse = 1/4 Bilateral faint,  Capillary Fill Time < 5 seconds, + chronic venous skin changes bilateral   Neurologic:Protective sensation diminished using the 5.07/10g Semmes Weinstein Monofilament. Vibratory diminished bilateral.   Musculosketal:  No Pain with palpation to ulcerated area. + Hallux interphalangeus R>L hallux, No pain with compression to calves bilateral.   Assessment and Plan:  Problem List Items Addressed This Visit    None    Visit Diagnoses    Diabetic ulcer of right foot associated with type 2 diabetes mellitus ([place])    -  Primary    PVD (peripheral vascular disease) ([place])        Type II diabetes mellitus with neurological manifestations ([place])        Dermatophytosis of nail        Pain of toe, unspecified laterality        Hallux interphalangeus, acquired, unspecified laterality          -Examined patient and discussed the progression of the wound and treatment alternatives. -Excisionally dedbrided ulceration to healthy bleeding borders using a sterile chisel blade. -Applied topical antibiotic and dry  sterile dressing and instructed patient to continue with daily dressings at home consisting of triple antibiotic and bandaid/dry sterile dressing. -Advised patient to return to wound healing shoe to assist with offloading the area and encourage healing however patient still continues to use sneakers  -Will consider repeat x-rays and vascular consult if fails to continue to improve -May also consider wound culture and if negative Grafix at next visit. -Advised patient to go to the ER or return to office if the wound worsens or if constitutional symptoms are present. -Nails x 10 were mechanically debrided using sterile nail nipper without incident. -[employer] diabetic shoe order form was completed; office to contact primary care for approval/ certification;  Office to arrange shoe fitting and dispensing. -Patient to return to office in 3 weeks for follow up care and evaluation or sooner if problems arise.  Landis Martins, DPM

## 2015-10-30 DIAGNOSIS — E1129 Type 2 diabetes mellitus with other diabetic kidney complication: Secondary | ICD-10-CM | POA: Diagnosis not present

## 2015-10-30 DIAGNOSIS — Z1159 Encounter for screening for other viral diseases: Secondary | ICD-10-CM | POA: Diagnosis not present

## 2015-10-30 DIAGNOSIS — E291 Testicular hypofunction: Secondary | ICD-10-CM | POA: Diagnosis not present

## 2015-10-30 DIAGNOSIS — Z114 Encounter for screening for human immunodeficiency virus [HIV]: Secondary | ICD-10-CM | POA: Diagnosis not present

## 2015-10-30 DIAGNOSIS — E559 Vitamin D deficiency, unspecified: Secondary | ICD-10-CM | POA: Diagnosis not present

## 2015-10-30 DIAGNOSIS — E1121 Type 2 diabetes mellitus with diabetic nephropathy: Secondary | ICD-10-CM | POA: Diagnosis not present

## 2015-10-30 DIAGNOSIS — I10 Essential (primary) hypertension: Secondary | ICD-10-CM | POA: Diagnosis not present

## 2015-10-30 DIAGNOSIS — E785 Hyperlipidemia, unspecified: Secondary | ICD-10-CM | POA: Diagnosis not present

## 2015-10-31 ENCOUNTER — Other Ambulatory Visit: Payer: Self-pay | Admitting: "Endocrinology

## 2015-10-31 LAB — HEPATIC FUNCTION PANEL
ALBUMIN: 4 g/dL (ref 3.6–5.1)
ALT: 22 U/L (ref 9–46)
AST: 22 U/L (ref 10–35)
Alkaline Phosphatase: 68 U/L (ref 40–115)
BILIRUBIN INDIRECT: 0.3 mg/dL (ref 0.2–1.2)
Bilirubin, Direct: 0.1 mg/dL (ref <=0.2)
TOTAL PROTEIN: 6.2 g/dL (ref 6.1–8.1)
Total Bilirubin: 0.4 mg/dL (ref 0.2–1.2)

## 2015-10-31 LAB — LIPID PANEL
Cholesterol: 150 mg/dL (ref 125–200)
HDL: 48 mg/dL (ref >=40)
LDL CALC: 77 mg/dL (ref <130)
Total CHOL/HDL Ratio: 3.1 Ratio (ref <=5.0)
Triglycerides: 124 mg/dL (ref <150)
VLDL: 25 mg/dL (ref <30)

## 2015-10-31 LAB — HIV ANTIBODY (ROUTINE TESTING W REFLEX): HIV 1&2 Ab, 4th Generation: NONREACTIVE

## 2015-10-31 LAB — VITAMIN D 25 HYDROXY (VIT D DEFICIENCY, FRACTURES): VIT D 25 HYDROXY: 43 ng/mL (ref 30–100)

## 2015-10-31 LAB — HEPATITIS C ANTIBODY: HCV Ab: NEGATIVE

## 2015-11-08 ENCOUNTER — Ambulatory Visit (INDEPENDENT_AMBULATORY_CARE_PROVIDER_SITE_OTHER): Payer: PPO | Admitting: Urology

## 2015-11-08 DIAGNOSIS — R6882 Decreased libido: Secondary | ICD-10-CM

## 2015-11-08 DIAGNOSIS — E291 Testicular hypofunction: Secondary | ICD-10-CM

## 2015-11-20 ENCOUNTER — Other Ambulatory Visit: Payer: Self-pay | Admitting: "Endocrinology

## 2015-12-03 ENCOUNTER — Other Ambulatory Visit: Payer: Self-pay | Admitting: Family Medicine

## 2015-12-03 ENCOUNTER — Encounter: Payer: Self-pay | Admitting: Sports Medicine

## 2015-12-03 ENCOUNTER — Ambulatory Visit (INDEPENDENT_AMBULATORY_CARE_PROVIDER_SITE_OTHER): Payer: PPO | Admitting: Sports Medicine

## 2015-12-03 DIAGNOSIS — L97519 Non-pressure chronic ulcer of other part of right foot with unspecified severity: Secondary | ICD-10-CM

## 2015-12-03 DIAGNOSIS — E1149 Type 2 diabetes mellitus with other diabetic neurological complication: Secondary | ICD-10-CM | POA: Diagnosis not present

## 2015-12-03 DIAGNOSIS — I739 Peripheral vascular disease, unspecified: Secondary | ICD-10-CM

## 2015-12-03 DIAGNOSIS — E11621 Type 2 diabetes mellitus with foot ulcer: Secondary | ICD-10-CM | POA: Diagnosis not present

## 2015-12-03 NOTE — Progress Notes (Signed)
Patient ID: Nicholas [name], male   DOB: 1955/08/28, 60 y.o.   MRN: [id number]  Subjective: Nicholas [name] is a 60 y.o. male patient seen in office for nail trim and for follow up evaluation of ulceration of the right big toe; states that he has been changing dressing daily with triple antibiotic without problems up until lately because ulcer has calloused over. Patient has a history of diabetes and a blood glucose level today of 150 mg/dl. Denies nausea/fever/vomiting/chills/night sweats/shortness of breath/pain. Patient has no other pedal complaints at this time.  Patient is also here for Diabetic shoes to pick out style.    Patient Active Problem List   Diagnosis Date Noted  . Skin lesion of back 10/07/2015  . Back pain with left-sided radiculopathy 06/05/2015  . Hyperlipidemia 04/10/2015  . Benign hypertension 04/10/2015  . Abdominal wall bulge 01/15/2015  . Medicare annual wellness visit, subsequent 10/15/2014  . Wrist pain, right 10/15/2014  . Atypical chest pain 07/22/2014  . Hx of adenomatous colonic polyps 02/21/2014  . Candidiasis 11/28/2013  . Allergic rhinitis 07/24/2013  . Cough due to bronchospasm 05/24/2013  . Onychomycosis 05/21/2013  . 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 Back pain with radiation 04/20/2011  . Seborrheic dermatitis 11/20/2010  . Dermatomycosis 11/20/2010  . FATIGUE 04/01/2010  . CARPAL TUNNEL SYNDROME, BILATERAL 03/24/2010  . Hypogonadism in male 01/27/2010  . DEPRESSION/ANXIETY 01/27/2010  . CHF 01/13/2010  . DYSPNEA 01/13/2010  . Type 2 diabetes mellitus with diabetic nephropathy ([name]) 12/12/2009  . MORBID OBESITY 12/12/2009  . Benign hypertension 12/12/2009  . PERIPHERAL VASCULAR INSUFFICIENCY,  LEGS, BILATERAL 12/12/2009   Current Outpatient Prescriptions on File Prior to Visit  Medication Sig Dispense Refill  . ACCU-CHEK AVIVA PLUS test strip TEST FOUR TIMES A DAY AS DIRECTED. 100 each 5  . Alcohol Swabs  (ALCOHOL PREP) 70 % PADS     . aspirin (ASPIRIN LOW DOSE) 81 MG EC tablet Take 81 mg by mouth daily. Take one tablet by mouth once a day     . azelastine (ASTELIN) 0.1 % nasal spray Place 2 sprays into both nostrils 2 (two) times daily. 30 mL 2  . B-D INS SYR ULTRAFINE 1CC/31G 31G X 5/16" 1 ML MISC USE AS DIRECTED WITH INSULIN VIALS TWICE DAILY. 100 each 2  . B-D INS SYRINGE 0.5CC/31GX5/16 31G X 5/16" 0.5 ML MISC USE TO INJECT THREE TIMES DAILY. 100 each PRN  . baclofen (LIORESAL) 10 MG tablet     . Blood Glucose Calibration (FREESTYLE CONTROL SOLUTION) LIQD     . brimonidine (ALPHAGAN P) 0.1 % SOLN Place 1 drop into both eyes 2 (two) times daily. One drop in each eye two times a day     . brimonidine-timolol (COMBIGAN) 0.2-0.5 % ophthalmic solution Place 1 drop into both eyes every 12 (twelve) hours. One drop in each eye two times a day      . citalopram (CELEXA) 40 MG tablet TAKE 1 TABLET BY MOUTH ONCE A DAY. 30 tablet 2  . clotrimazole-betamethasone (LOTRISONE) cream APPLY TO AFFECTED AREAS TWICE DAILY. 45 g 0  . DULoxetine (CYMBALTA) 30 MG capsule     . furosemide (LASIX) 20 MG tablet Take 1 tablet (20 mg total) by mouth daily. 30 tablet 1  . gabapentin (NEURONTIN) 400 MG capsule     . HUMULIN R U-500 KWIKPEN 500 UNIT/ML kwikpen INJECT UP TO 100 UNITS SUBCUTANEOUSLY 30 MINUTES BEFORE MEALS. 18 mL 2  . Insulin  Regular Human (HUMULIN R U-500, CONCENTRATED, ) Inject 80 Units into the skin 3 (three) times daily before meals.    Marland Kitchen ketoconazole (NIZORAL) 2 % shampoo APPLY TOPICALLY TWICE WEEKLY AS DIRECTED. 120 mL 2  . lamoTRIgine (LAMICTAL) 200 MG tablet TAKE 1 TABLET BY MOUTH ONCE A DAY. 30 tablet 2  . lisinopril (PRINIVIL,ZESTRIL) 5 MG tablet TAKE 1 TABLET BY MOUTH ONCE A DAY. 30 tablet 2  . NOVOFINE 30G X 8 MM MISC USE TWICE DAILY WITH BYETTA 100 each 5  . nystatin (MYCOSTATIN) powder APPLY TO AFFECTED AREA TWICE DAILY FOR 10 DAYS. 60 g 1  . nystatin ointment (MYCOSTATIN) APPLY TO AFFECTED  AREAS TWICE DAILY. 30 g 0  . nystatin-triamcinolone ointment (MYCOLOG) APPLY TO AFFECTED AREA TWICE DAILY. 60 g 2  . pantoprazole (PROTONIX) 40 MG tablet TAKE (1) TABLET BY MOUTH TWICE DAILY. 60 tablet 5  . potassium chloride SA (KLOR-CON M20) 20 MEQ tablet Take 1 tablet (20 mEq total) by mouth daily. 30 tablet 1  . simvastatin (ZOCOR) 40 MG tablet TAKE 1 TABLET BY MOUTH ONCE A DAY. 30 tablet 3  . Testosterone (ANDROGEL PUMP TD) Place 75 g onto the skin every morning.    . TRAVATAN Z 0.004 % SOLN ophthalmic solution     . triamcinolone ointment (KENALOG) 0.1 % APPLY TO AFFECTED AREAS TWICE DAILY. 30 g 3  . VICTOZA 18 MG/3ML SOPN INJECT 1.8 MG SUBCUTANEOUSLY ONCE DAILY. 9 mL 2  . Vitamin D, Ergocalciferol, (DRISDOL) 50000 units CAPS capsule TAKE ONE CAPSULE BY MOUTH ONCE WEEKLY. 12 capsule 0   No current facility-administered medications on file prior to visit.   No Known Allergies  Recent Results (from the past 2160 hour(s))  Basic metabolic panel     Status: Abnormal   Collection Time: 10/01/15 12:09 PM  Result Value Ref Range   Sodium 136 135 - 146 mmol/L   Potassium 4.5 3.5 - 5.3 mmol/L   Chloride 105 98 - 110 mmol/L   CO2 25 20 - 31 mmol/L   Glucose, Bld 148 (H) 65 - 99 mg/dL   BUN 17 7 - 25 mg/dL   Creat 1.03 0.70 - 1.25 mg/dL   Calcium 9.1 8.6 - 10.3 mg/dL  TSH     Status: None   Collection Time: 10/01/15 12:09 PM  Result Value Ref Range   TSH 2.30 0.40 - 4.50 mIU/L  T4, free     Status: None   Collection Time: 10/01/15 12:09 PM  Result Value Ref Range   Free T4 1.0 0.8 - 1.8 ng/dL  Hemoglobin A1c     Status: Abnormal   Collection Time: 10/01/15 12:09 PM  Result Value Ref Range   Hgb A1c MFr Bld 6.3 (H) <5.7 %    Comment:   For someone without known diabetes, a hemoglobin A1c value between 5.7% and 6.4% is consistent with prediabetes and should be confirmed with a follow-up test.   For someone with known diabetes, a value <7% indicates that their diabetes is well  controlled. A1c targets should be individualized based on duration of diabetes, age, co-morbid conditions and other considerations.   This assay result is consistent with an increased risk of diabetes.   Currently, no consensus exists regarding use of hemoglobin A1c for diagnosis of diabetes in children.      Mean Plasma Glucose 134 mg/dL  HIV antibody     Status: None   Collection Time: 10/30/15 11:12 AM  Result Value Ref Range  HIV 1&2 Ab, 4th Generation NONREACTIVE NONREACTIVE    Comment:   HIV-1 antigen and HIV-1/HIV-2 antibodies were not detected.  There is no laboratory evidence of HIV infection.   HIV-1/2 Antibody Diff        Not indicated. HIV-1 RNA, Qual TMA          Not indicated.     PLEASE NOTE: This information has been disclosed to you from records whose confidentiality may be protected by state law. If your state requires such protection, then the state law prohibits you from making any further disclosure of the information without the specific written consent of the person to whom it pertains, or as otherwise permitted by law. A general authorization for the release of medical or other information is NOT sufficient for this purpose.   The performance of this assay has not been clinically validated in patients less than 37 years old.   For additional information please refer to http://education.quest[facility].com/faq/FAQ106.  (This link is being provided for informational/educational purposes only.)     Hepatitis C antibody     Status: None   Collection Time: 10/30/15 11:12 AM  Result Value Ref Range   HCV Ab NEGATIVE NEGATIVE  Lipid panel     Status: None   Collection Time: 10/30/15 11:12 AM  Result Value Ref Range   Cholesterol 150 125 - 200 mg/dL   Triglycerides 124 <150 mg/dL   HDL 48 >=40 mg/dL   Total CHOL/HDL Ratio 3.1 <=5.0 Ratio   VLDL 25 <30 mg/dL   LDL Cholesterol 77 <130 mg/dL    Comment:   Total Cholesterol/HDL Ratio:CHD Risk                         Coronary Heart Disease Risk Table                                        Men       Women          1/2 Average Risk              3.4        3.3              Average Risk              5.0        4.4           2X Average Risk              9.6        7.1           3X Average Risk             23.4       11.0 Use the calculated Patient Ratio above and the CHD Risk table  to determine the patient's CHD Risk.   VITAMIN D 25 Hydroxy (Vit-D Deficiency, Fractures)     Status: None   Collection Time: 10/30/15 11:12 AM  Result Value Ref Range   Vit D, 25-Hydroxy 43 30 - 100 ng/mL    Comment: Vitamin D Status           25-OH Vitamin D        Deficiency                <20 ng/mL  Insufficiency         20 - 29 ng/mL        Optimal             > or = 30 ng/mL   For 25-OH Vitamin D testing on patients on D2-supplementation and patients for whom quantitation of D2 and D3 fractions is required, the [facility] 25-OH VIT D, (D2,D3), LC/MS/MS is recommended: order code [phone] (patients > 2 yrs).   Hepatic function panel     Status: None   Collection Time: 10/30/15 11:12 AM  Result Value Ref Range   Total Bilirubin 0.4 0.2 - 1.2 mg/dL   Bilirubin, Direct 0.1 <=0.2 mg/dL   Indirect Bilirubin 0.3 0.2 - 1.2 mg/dL   Alkaline Phosphatase 68 40 - 115 U/L   AST 22 10 - 35 U/L   ALT 22 9 - 46 U/L   Total Protein 6.2 6.1 - 8.1 g/dL   Albumin 4.0 3.6 - 5.1 g/dL    Objective: There were no vitals filed for this visit.  General: Patient is awake, alert, oriented x 3 and in no acute distress.  Dermatology: Skin is warm and dry bilateral with a healed ulceration plantar right hallux with reactive keratosis. No acute signs of infection. Nails are short and thickened x 10 with no signs of ingrowing or infection.    Vascular: Dorsalis Pedis pulse = 0/4 Bilateral,  Posterior Tibial pulse = 1/4 Bilateral faint,  Capillary Fill Time < 5 seconds, + chronic venous skin changes bilateral    Neurologic:Protective sensation diminished using the 5.07/10g Semmes Weinstein Monofilament. Vibratory diminished bilateral.   Musculosketal:  No Pain with palpation to previous ulcerated area at right hallux. + Hallux interphalangeus R>L hallux, No pain with compression to calves bilateral.   Assessment and Plan:  Problem List Items Addressed This Visit    None    Visit Diagnoses    Diabetic ulcer of right foot associated with type 2 diabetes mellitus ([place])    -  Primary    healed at right great toe    PVD (peripheral vascular disease) ([place])        Type II diabetes mellitus with neurological manifestations ([place])          -Examined patient  -Parred keratotic lesion at right hallux using sterile chisel blade; previous hallux ulceration healed -Dispensed silicone toe pad to use at Right hallux to prevent re-ulceration -Patient met with medical assistant and picked out a new style of diabetic shoes -Advised patient to closely monitor for signs of infection or re-occurrence of ulceration  -Patient to return to office in 8 weeks for follow up care and evaluation or sooner if problems arise.  Landis Martins, DPM

## 2015-12-21 ENCOUNTER — Other Ambulatory Visit: Payer: Self-pay | Admitting: Family Medicine

## 2015-12-31 ENCOUNTER — Encounter (HOSPITAL_COMMUNITY): Payer: Self-pay | Admitting: Emergency Medicine

## 2015-12-31 ENCOUNTER — Emergency Department (HOSPITAL_COMMUNITY): Payer: PPO

## 2015-12-31 ENCOUNTER — Emergency Department (HOSPITAL_COMMUNITY)
Admission: EM | Admit: 2015-12-31 | Discharge: 2015-12-31 | Disposition: A | Discharge status: Home / Self Care | Payer: PPO | Attending: Emergency Medicine | Admitting: Emergency Medicine

## 2015-12-31 DIAGNOSIS — I10 Essential (primary) hypertension: Secondary | ICD-10-CM | POA: Insufficient documentation

## 2015-12-31 DIAGNOSIS — Z79899 Other long term (current) drug therapy: Secondary | ICD-10-CM | POA: Insufficient documentation

## 2015-12-31 DIAGNOSIS — E1142 Type 2 diabetes mellitus with diabetic polyneuropathy: Secondary | ICD-10-CM | POA: Diagnosis not present

## 2015-12-31 DIAGNOSIS — F329 Major depressive disorder, single episode, unspecified: Secondary | ICD-10-CM | POA: Diagnosis not present

## 2015-12-31 DIAGNOSIS — Z7982 Long term (current) use of aspirin: Secondary | ICD-10-CM | POA: Insufficient documentation

## 2015-12-31 DIAGNOSIS — Z794 Long term (current) use of insulin: Secondary | ICD-10-CM | POA: Diagnosis not present

## 2015-12-31 DIAGNOSIS — E1140 Type 2 diabetes mellitus with diabetic neuropathy, unspecified: Secondary | ICD-10-CM | POA: Diagnosis not present

## 2015-12-31 DIAGNOSIS — E785 Hyperlipidemia, unspecified: Secondary | ICD-10-CM | POA: Diagnosis not present

## 2015-12-31 DIAGNOSIS — Z87891 Personal history of nicotine dependence: Secondary | ICD-10-CM | POA: Diagnosis not present

## 2015-12-31 DIAGNOSIS — M79672 Pain in left foot: Secondary | ICD-10-CM

## 2015-12-31 DIAGNOSIS — M25572 Pain in left ankle and joints of left foot: Secondary | ICD-10-CM | POA: Diagnosis not present

## 2015-12-31 LAB — BASIC METABOLIC PANEL
ANION GAP: 6 (ref 5–15)
BUN: 17 mg/dL (ref 6–20)
CO2: 24 mmol/L (ref 22–32)
Calcium: 8.7 mg/dL — ABNORMAL LOW (ref 8.9–10.3)
Chloride: 107 mmol/L (ref 101–111)
Creatinine, Ser: 1.11 mg/dL (ref 0.61–1.24)
GFR calc Af Amer: >60 mL/min (ref >60)
Glucose, Bld: 206 mg/dL — ABNORMAL HIGH (ref 65–99)
POTASSIUM: 4.5 mmol/L (ref 3.5–5.1)
SODIUM: 137 mmol/L (ref 135–145)

## 2015-12-31 LAB — CBC WITH DIFFERENTIAL/PLATELET
BASOS ABS: 0 10*3/uL (ref 0.0–0.1)
Basophils Relative: 0 %
EOS ABS: 0.3 10*3/uL (ref 0.0–0.7)
EOS PCT: 2 %
HCT: 47.1 % (ref 39.0–52.0)
Hemoglobin: 14.9 g/dL (ref 13.0–17.0)
Lymphocytes Relative: 15 %
Lymphs Abs: 1.6 10*3/uL (ref 0.7–4.0)
MCH: 29 pg (ref 26.0–34.0)
MCHC: 31.6 g/dL (ref 30.0–36.0)
MCV: 91.8 fL (ref 78.0–100.0)
Monocytes Absolute: 0.8 10*3/uL (ref 0.1–1.0)
Monocytes Relative: 8 %
Neutro Abs: 7.8 10*3/uL — ABNORMAL HIGH (ref 1.7–7.7)
Neutrophils Relative %: 75 %
PLATELETS: 189 10*3/uL (ref 150–400)
RBC: 5.13 MIL/uL (ref 4.22–5.81)
RDW: 15.3 % (ref 11.5–15.5)
WBC: 10.5 10*3/uL (ref 4.0–10.5)

## 2015-12-31 MED ORDER — GABAPENTIN 400 MG PO CAPS: 400.0000 mg | ORAL_CAPSULE | Freq: Three times a day (TID) | ORAL | Status: DC

## 2015-12-31 NOTE — ED Notes (Signed)
Pt reports pain in left heel since yesterday with tingling in toes, pt has diabetes and denies having a sore.

## 2015-12-31 NOTE — Discharge Instructions (Signed)
Continue to take tramadol at home 1-2 tablets every 6 hours as needed. Take gabapentin as prescribed. See your PCP for follow-up.  Return for worsening symptoms, including fever, increased swelling or pain, inability to walk or any other symptoms concerning to you.  Diabetic Neuropathy Diabetic neuropathy is a nerve disease or nerve damage that is caused by diabetes mellitus. About half of all people with diabetes mellitus have some form of nerve damage. Nerve damage is more common in those who have had diabetes mellitus for many years and who generally have not had good control of their blood sugar (glucose) level. Diabetic neuropathy is a common complication of diabetes mellitus. There are three common types of diabetic neuropathy and a fourth type that is less common and less understood:   Peripheral neuropathy--This is the most common type of diabetic neuropathy. It causes damage to the nerves of the feet and legs first and then eventually the hands and arms.The damage affects the ability to sense touch.  Autonomic neuropathy--This type causes damage to the autonomic nervous system, which controls the following functions:  Heartbeat.  Body temperature.  Blood pressure.  Urination.  Digestion.  Sweating.  Sexual function.  Focal neuropathy--Focal neuropathy can be painful and unpredictable and occurs most often in older adults with diabetes mellitus. It involves a specific nerve or one area and often comes on suddenly. It usually does not cause long-term problems.  Radiculoplexus neuropathy-- Sometimes called lumbosacral radiculoplexus neuropathy, radiculoplexus neuropathy affects the nerves of the thighs, hips, buttocks, or legs. It is more common in people with type 2 diabetes mellitus and in older men. It is characterized by debilitating pain, weakness, and atrophy, usually in the thigh muscles. CAUSES  The cause of peripheral, autonomic, and focal neuropathies is diabetes  mellitus that is uncontrolled and high glucose levels. The cause of radiculoplexus neuropathy is unknown. However, it is thought to be caused by inflammation related to uncontrolled glucose levels. SIGNS AND SYMPTOMS  Peripheral Neuropathy Peripheral neuropathy develops slowly over time. When the nerves of the feet and legs no longer work there may be:   Burning, stabbing, or aching pain in the legs or feet.  Inability to feel pressure or pain in your feet. This can lead to:  Thick calluses over pressure areas.  Pressure sores.  Ulcers.  Foot deformities.  Reduced ability to feel temperature changes.  Muscle weakness. Autonomic Neuropathy The symptoms of autonomic neuropathy vary depending on which nerves are affected. Symptoms may include:  Problems with digestion, such as:  Feeling sick to your stomach (nausea).  Vomiting.  Bloating.  Constipation.  Diarrhea.  Abdominal pain.  Difficulty with urination. This occurs if you lose your ability to sense when your bladder is full. Problems include:  Urine leakage (incontinence).  Inability to empty your bladder completely (retention).  Rapid or irregular heartbeat (palpitations).  Blood pressure drops when you stand up (orthostatic hypotension). When you stand up you may feel:  Dizzy.  Weak.  Faint.  In men, inability to attain and maintain an erection.  In women, vaginal dryness and problems with decreased sexual desire and arousal.  Problems with body temperature regulation.  Increased or decreased sweating. Focal Neuropathy  Abnormal eye movements or abnormal alignment of both eyes.  Weakness in the wrist.  Foot drop. This results in an inability to lift the foot properly and abnormal walking or foot movement.  Paralysis on one side of your face (Bell palsy).  Chest or abdominal pain. Radiculoplexus Neuropathy  Sudden,  severe pain in your hip, thigh, or buttocks.  Weakness and wasting of thigh  muscles.  Difficulty rising from a seated position.  Abdominal swelling.  Unexplained weight loss (usually more than 10 lb [4.5 kg]). DIAGNOSIS  Peripheral Neuropathy Your senses may be tested. Sensory function testing can be done with:  A light touch using a monofilament.  A vibration with tuning fork.  A sharp sensation with a pin prick. Other tests that can help diagnose neuropathy are:  Nerve conduction velocity. This test checks the transmission of an electrical current through a nerve.  Electromyography. This shows how muscles respond to electrical signals transmitted by nearby nerves.  Quantitative sensory testing. This is used to assess how your nerves respond to vibrations and changes in temperature. Autonomic Neuropathy Diagnosis is often based on reported symptoms. Tell your health care provider if you experience:   Dizziness.   Constipation.   Diarrhea.   Inappropriate urination or inability to urinate.   Inability to get or maintain an erection.  Tests that may be done include:   Electrocardiography or Holter monitor. These are tests that can help show problems with the heart rate or heart rhythm.   An X-ray exam may be done. Focal Neuropathy Diagnosis is made based on your symptoms and what your health care provider finds during your exam. Other tests may be done. They may include:  Nerve conduction velocities. This checks the transmission of electrical current through a nerve.  Electromyography. This shows how muscles respond to electrical signals transmitted by nearby nerves.  Quantitative sensory testing. This test is used to assess how your nerves respond to vibration and changes in temperature. Radiculoplexus Neuropathy  Often the first thing is to eliminate any other issue or problems that might be the cause, as there is no stick test for diagnosis.  X-ray exam of your spine and lumbar region.  Spinal tap to rule out cancer.  MRI to  rule out other lesions. TREATMENT  Once nerve damage occurs, it cannot be reversed. The goal of treatment is to keep the disease or nerve damage from getting worse and affecting more nerve fibers. Controlling your blood glucose level is the key. Most people with radiculoplexus neuropathy see at least a partial improvement over time. You will need to keep your blood glucose and HbA1c levels in the target range determined by your health care provider. Things that help control blood glucose levels include:   Blood glucose monitoring.   Meal planning.   Physical activity.   Diabetes medicine.  Over time, maintaining lower blood glucose levels helps lessen symptoms. Sometimes, prescription pain medicine is needed. HOME CARE INSTRUCTIONS:  Do not smoke.  Keep your blood glucose level in the range that you and your health care provider have determined acceptable for you.  Keep your blood pressure level in the range that you and your health care provider have determined acceptable for you.  Eat a well-balanced diet.  Be physically active every day. Include strength training and balance exercises.  Protect your feet.  Check your feet every day for sores, cuts, blisters, or signs of infection.  Wear padded socks and supportive shoes. Use orthotic inserts, if necessary.  Regularly check the insides of your shoes for worn spots. Make sure there are no rocks or other items inside your shoes before you put them on. SEEK MEDICAL CARE IF:   You have burning, stabbing, or aching pain in the legs or feet.  You are unable to feel pressure  or pain in your feet.  You develop problems with digestion such as:  Nausea.  Vomiting.  Bloating.  Constipation.  Diarrhea.  Abdominal pain.  You have difficulty with urination, such as:  Incontinence.  Retention.  You have palpitations.  You develop orthostatic hypotension. When you stand up you may  feel:  Dizzy.  Weak.  Faint.  You cannot attain and maintain an erection (in men).  You have vaginal dryness and problems with decreased sexual desire and arousal (in women).  You have severe pain in your thighs, legs, or buttocks.  You have unexplained weight loss.   This information is not intended to replace advice given to you by your health care provider. Make sure you discuss any questions you have with your health care provider.   Document Released: 08/24/2001 Document Revised: 07/06/2014 Document Reviewed: 11/24/2012 Elsevier Interactive Patient Education [employer].

## 2015-12-31 NOTE — ED Provider Notes (Signed)
CSN: [id number]     Arrival date & time 12/31/15  [id number] History   First MD Initiated Contact with Patient 12/31/15 [phone]     Chief Complaint  Patient presents with  . Foot Pain     (Consider location/radiation/quality/duration/timing/severity/associated sxs/prior Treatment) HPI 60 year old male who presents with left heel pain. Pain started yesterday while walking. No injury or trauma. No strain and was not feet longer. No lesions/ulcer, fever, chills, nausea or vomiting. Took tramadol with no major good effect. History of obseity, HTN, HLD, and DM. Blood sugars near baseline for him. Pain worse with walking, sharp with burning in his feet.   Past Medical History  Diagnosis Date  . Diabetes ([place])   . Hyperlipidemia   . Testosterone deficiency   . Hypertension   . Poor circulation     leg   . MRSA (methicillin resistant Staphylococcus aureus)   . Chronic knee pain   . Depression   . Glaucoma   . Suicide attempt [facility])    Past Surgical History  Procedure Laterality Date  . Kidney stones      cysto with litholapexy  . Colonoscopy  03/2008    [id number] external hemorrhoidal tag, otherwise normal rectum/Two diminutive rectosigmoid polyps s/p bx/ Polyp in the opposite the ileocecal valve s/p bx. tubular adenoma.  . Colonoscopy N/A 03/23/2014    [id number] colon polyps throughout his rectum and colon. Sessile and pedunculated. The largest polyp was proximally 6 mm in dimensions in the retum at 3 cm with adjacent diminutive polyp.  There was also a polyps on ileocecal valve, ascending segmen descending and sigmoid segment. as the colon was tortuous & elongated requiring externa abd pressure and changing of the pts postion to reach cecum  . Esophagogastroduodenoscopy N/A 03/23/2014    RMR: 1. Patient had circumferential distal esophageal erosions within 5 mm of the GE junction.  No Barrett's esophagus. Tubular esophagus patent throughout its course. 2.  Diffuse gastric erosions. (1) 44mm area  of healing ulceration in the antrum. No ulcer or infiltrating process observed. Patent pylorus. Normal-appearing first and second portion of the duodenum .  Marland Kitchen Cholecystectomy N/A 02/25/2015    Procedure: LAPAROSCOPIC CHOLECYSTECTOMY;  Surgeon: Aviva Signs, MD;  Location: [facility];  Service: General;  Laterality: N/A;   Family History  Problem Relation Age of Onset  . Diabetes Mother   . Leukemia Mother   . Cancer Father     colon, age greater than 71?  Marland Kitchen Heart disease Father   . Glaucoma Father   . Parkinsonism Father     altzheimers , brain hemorrhage  . Colon cancer Neg Hx    Social History  Substance Use Topics  . Smoking status: Former Smoker -- 1.50 packs/day for 25 years    Types: Cigarettes  . Smokeless tobacco: Former Systems developer    Quit date: 02/20/2002     Comment: Quit x 13 years  . Alcohol Use: No    Review of Systems 10/14 systems reviewed and are negative other than those stated in the HPI    Allergies  Review of patient's allergies indicates no known allergies.  Home Medications   Prior to Admission medications   Medication Sig Start Date End Date Taking? Authorizing Provider  aspirin (ASPIRIN LOW DOSE) 81 MG EC tablet Take 81 mg by mouth daily. Take one tablet by mouth once a day    Yes Historical Provider, MD  baclofen (LIORESAL) 10 MG tablet Take 10 mg by mouth daily as needed.  08/27/15  Yes Historical Provider, MD  brimonidine (ALPHAGAN P) 0.1 % SOLN Place 1 drop into both eyes 2 (two) times daily. One drop in each eye two times a day    Yes Historical Provider, MD  brimonidine-timolol (COMBIGAN) 0.2-0.5 % ophthalmic solution Place 1 drop into both eyes every 12 (twelve) hours. One drop in each eye two times a day     Yes Historical Provider, MD  citalopram (CELEXA) 40 MG tablet TAKE 1 TABLET BY MOUTH ONCE A DAY. 12/23/15  Yes Fayrene Helper, MD  clotrimazole-betamethasone (LOTRISONE) cream APPLY TO AFFECTED AREAS TWICE DAILY. 11/28/13  Yes Fayrene Helper,  MD  furosemide (LASIX) 20 MG tablet Take 1 tablet (20 mg total) by mouth daily. 01/09/15  Yes Fayrene Helper, MD  gabapentin (NEURONTIN) 400 MG capsule Take 400 mg by mouth 2 (two) times daily.  07/25/15  Yes Historical Provider, MD  HUMULIN R U-500 KWIKPEN 500 UNIT/ML kwikpen INJECT UP TO 100 UNITS SUBCUTANEOUSLY 30 MINUTES BEFORE MEALS. Patient taking differently: INJECT UP TO 80 UNITS SUBCUTANEOUSLY 30 MINUTES BEFORE MEALS. 10/23/15  Yes Cassandria Anger, MD  ketoconazole (NIZORAL) 2 % shampoo APPLY TOPICALLY TWICE WEEKLY AS DIRECTED. 09/24/15  Yes Fayrene Helper, MD  lamoTRIgine (LAMICTAL) 200 MG tablet TAKE 1 TABLET BY MOUTH ONCE A DAY. 09/24/15  Yes Fayrene Helper, MD  lisinopril (PRINIVIL,ZESTRIL) 5 MG tablet TAKE 1 TABLET BY MOUTH ONCE A DAY. 09/24/15  Yes Fayrene Helper, MD  pantoprazole (PROTONIX) 40 MG tablet TAKE (1) TABLET BY MOUTH TWICE DAILY. 08/06/15  Yes Orvil Feil, NP  simvastatin (ZOCOR) 40 MG tablet TAKE 1 TABLET BY MOUTH ONCE A DAY. 10/10/15  Yes Fayrene Helper, MD  Testosterone (ANDROGEL PUMP TD) Place 75 g onto the skin every morning.   Yes Historical Provider, MD  traMADol (ULTRAM) 50 MG tablet Take 100 mg by mouth every 6 (six) hours as needed.   Yes Historical Provider, MD  TRAVATAN Z 0.004 % SOLN ophthalmic solution Place 1 drop into both eyes at bedtime.  08/30/15  Yes Historical Provider, MD  VICTOZA 18 MG/3ML SOPN INJECT 1.8 MG SUBCUTANEOUSLY ONCE DAILY. 08/09/15  Yes Cassandria Anger, MD  Vitamin D, Ergocalciferol, (DRISDOL) 50000 units CAPS capsule TAKE ONE CAPSULE BY MOUTH ONCE WEEKLY. 11/01/15  Yes Cassandria Anger, MD  gabapentin (NEURONTIN) 400 MG capsule Take 1 capsule (400 mg total) by mouth 3 (three) times daily. 12/31/15   Forde Dandy, MD   BP 115/65 mmHg  Pulse 76  Temp(Src) 98.1 F (36.7 C) (Oral)  Resp 16  Ht 6' (1.829 m)  Wt 440 lb (199.583 kg)  BMI 59.66 kg/m2  SpO2 98% Physical Exam Physical Exam  Nursing note and vitals  reviewed. Constitutional: Chronically ill appearing, non-toxic, and in no acute distress Head: Normocephalic and atraumatic.  Mouth/Throat: Oropharynx is clear and moist.  Neck: Normal range of motion. Neck supple.  Cardiovascular: Normal rate and regular rhythm.  +2 DP pulses bilaterally Pulmonary/Chest: Effort normal and breath sounds normal.  Abdominal: Soft. There is no tenderness. There is no rebound and no guarding.  Musculoskeletal: bilateral chronic venous stasis changes, Tender to palpation over the left heel without lesions, open wounds, or lesions.  Neurological: Alert, no facial droop, fluent speech, full strength in bilateral LE ankle dorsi/plantarflexion, sensation to bilateral lower extremities in tact Skin: Skin is warm and dry.  Psychiatric: Cooperative  ED Course  Procedures (including critical care time) Labs Review Labs Reviewed  CBC WITH DIFFERENTIAL/PLATELET - Abnormal; Notable for the following:    Neutro Abs 7.8 (*)    All other components within normal limits  BASIC METABOLIC PANEL - Abnormal; Notable for the following:    Glucose, Bld 206 (*)    Calcium 8.7 (*)    All other components within normal limits    Imaging Review Dg Foot Complete Left  12/31/2015  CLINICAL DATA:  Heel pain.  No known injury. EXAM: LEFT FOOT - COMPLETE 3+ VIEW COMPARISON:  None. FINDINGS: No acute bony abnormality. Specifically, no fracture, subluxation, or dislocation. Soft tissues are intact. Joint spaces are maintained. IMPRESSION: No acute bony abnormality. Electronically Signed   By: Rolm Baptise M.D.   On: 12/31/2015 10:25   I have personally reviewed and evaluated these images and lab results as part of my medical decision-making.   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 On  presentation he is nontoxic in no acute distress. Afebrile hemodynamically stable. With chronic venous stasis changes in bilateral lower extremities. Tenderness to palpation over the left heel without open wound, swelling, or overlying skin changes. States that he has had intermittent similar symptoms in the right foot as well, and pain seems burning in nature which seems like symptoms may be more consistent with that of his neuropathy. He has ran out of his gabapentin, and only taking a lower dose 2 times daily. He is given prescription for 400 mg 3 times a day to start taking as well as instructions to take his home tramadol. Basic blood work reveals unremarkable CBC and basic metabolic panel aside from mild hyperglycemia of 200 w/o anion gap. X-ray of the foot is normal. Discuss continued supportive care instructions for home with outpatient primary care follow-up. Strict return and follow-up instructions reviewed. He expressed understanding of all discharge instructions and felt comfortable with the plan of care.     Forde Dandy, MD 12/31/15 [phone]

## 2015-12-31 NOTE — ED Notes (Signed)
Lab at bedside

## 2016-01-03 ENCOUNTER — Other Ambulatory Visit: Payer: Self-pay | Admitting: "Endocrinology

## 2016-01-03 DIAGNOSIS — E1121 Type 2 diabetes mellitus with diabetic nephropathy: Secondary | ICD-10-CM | POA: Diagnosis not present

## 2016-01-03 LAB — HEMOGLOBIN A1C
HEMOGLOBIN A1C: 6.5 % — AB (ref <5.7)
MEAN PLASMA GLUCOSE: 140 mg/dL

## 2016-01-04 LAB — COMPLETE METABOLIC PANEL WITH GFR
ALT: 18 U/L (ref 9–46)
AST: 15 U/L (ref 10–35)
Albumin: 3.7 g/dL (ref 3.6–5.1)
Alkaline Phosphatase: 57 U/L (ref 40–115)
BILIRUBIN TOTAL: 0.4 mg/dL (ref 0.2–1.2)
BUN: 22 mg/dL (ref 7–25)
CALCIUM: 8.8 mg/dL (ref 8.6–10.3)
CHLORIDE: 104 mmol/L (ref 98–110)
CO2: 25 mmol/L (ref 20–31)
CREATININE: 1.09 mg/dL (ref 0.70–1.25)
GFR, EST AFRICAN AMERICAN: 84 mL/min (ref >=60)
GFR, EST NON AFRICAN AMERICAN: 73 mL/min (ref >=60)
Glucose, Bld: 274 mg/dL — ABNORMAL HIGH (ref 65–99)
Potassium: 5.4 mmol/L — ABNORMAL HIGH (ref 3.5–5.3)
Sodium: 139 mmol/L (ref 135–146)
Total Protein: 6 g/dL — ABNORMAL LOW (ref 6.1–8.1)

## 2016-01-10 ENCOUNTER — Encounter: Payer: Self-pay | Admitting: "Endocrinology

## 2016-01-10 ENCOUNTER — Ambulatory Visit (INDEPENDENT_AMBULATORY_CARE_PROVIDER_SITE_OTHER): Payer: PPO | Admitting: "Endocrinology

## 2016-01-10 VITALS — BP 114/71 | HR 89 | Ht 72.0 in | Wt >= 6400 oz

## 2016-01-10 DIAGNOSIS — E785 Hyperlipidemia, unspecified: Secondary | ICD-10-CM | POA: Diagnosis not present

## 2016-01-10 DIAGNOSIS — E1121 Type 2 diabetes mellitus with diabetic nephropathy: Secondary | ICD-10-CM | POA: Diagnosis not present

## 2016-01-10 DIAGNOSIS — I10 Essential (primary) hypertension: Secondary | ICD-10-CM | POA: Diagnosis not present

## 2016-01-10 MED ORDER — TRIAMCINOLONE ACETONIDE 0.1 % EX OINT: 1.0000 "application " | TOPICAL_OINTMENT | Freq: Two times a day (BID) | CUTANEOUS | Status: DC

## 2016-01-10 NOTE — Patient Instructions (Signed)

## 2016-01-10 NOTE — Progress Notes (Signed)
Subjective:    Patient ID: Nicholas [name], male    DOB: 11-Jun-1956,    Past Medical History  Diagnosis Date  . Diabetes ([facility] [place])   . Hyperlipidemia   . Testosterone deficiency   . Hypertension   . Poor circulation     leg   . MRSA (methicillin resistant Staphylococcus aureus)   . Chronic knee pain   . Depression   . Glaucoma   . Suicide attempt [facility])    Past Surgical History  Procedure Laterality Date  . Kidney stones      cysto with litholapexy  . Colonoscopy  03/2008    [id number] external hemorrhoidal tag, otherwise normal rectum/Two diminutive rectosigmoid polyps s/p bx/ Polyp in the opposite the ileocecal valve s/p bx. tubular adenoma.  . Colonoscopy N/A 03/23/2014    [id number] colon polyps throughout his rectum and colon. Sessile and pedunculated. The largest polyp was proximally 6 mm in dimensions in the retum at 3 cm with adjacent diminutive polyp.  There was also a polyps on ileocecal valve, ascending segmen descending and sigmoid segment. as the colon was tortuous & elongated requiring externa abd pressure and changing of the pts postion to reach cecum  . Esophagogastroduodenoscopy N/A 03/23/2014    RMR: 1. Patient had circumferential distal esophageal erosions within 5 mm of the GE junction.  No Barrett's esophagus. Tubular esophagus patent throughout its course. 2.  Diffuse gastric erosions. (1) 62mm area of healing ulceration in the antrum. No ulcer or infiltrating process observed. Patent pylorus. Normal-appearing first and second portion of the duodenum .  Marland Kitchen Cholecystectomy N/A 02/25/2015    Procedure: LAPAROSCOPIC CHOLECYSTECTOMY;  Surgeon: Aviva Signs, MD;  Location: [facility];  Service: General;  Laterality: N/A;   Social History   Social History  . Marital Status: Divorced    Spouse Name: N/A  . Number of Children: 0  . Years of Education: 12   Occupational History  . DISABLED   .     Social History Main Topics  . Smoking status: Former Smoker -- 1.50  packs/day for 25 years    Types: Cigarettes  . Smokeless tobacco: Former Systems developer    Quit date: 02/20/2002     Comment: Quit x 13 years  . Alcohol Use: No  . Drug Use: No  . Sexual Activity: Not Currently    Birth Control/ Protection: None   Other Topics Concern  . None   Social History Narrative   Outpatient Encounter Prescriptions as of 01/10/2016  Medication Sig  . aspirin (ASPIRIN LOW DOSE) 81 MG EC tablet Take 81 mg by mouth daily. Take one tablet by mouth once a day   . brimonidine (ALPHAGAN P) 0.1 % SOLN Place 1 drop into both eyes 2 (two) times daily. One drop in each eye two times a day   . brimonidine-timolol (COMBIGAN) 0.2-0.5 % ophthalmic solution Place 1 drop into both eyes every 12 (twelve) hours. One drop in each eye two times a day    . citalopram (CELEXA) 40 MG tablet TAKE 1 TABLET BY MOUTH ONCE A DAY.  . clotrimazole-betamethasone (LOTRISONE) cream APPLY TO AFFECTED AREAS TWICE DAILY.  . furosemide (LASIX) 20 MG tablet Take 1 tablet (20 mg total) by mouth daily. (Patient taking differently: Take 20 mg by mouth daily as needed. )  . gabapentin (NEURONTIN) 400 MG capsule Take 1 capsule (400 mg total) by mouth 3 (three) times daily.  Marland Kitchen HUMULIN R U-500 KWIKPEN 500 UNIT/ML kwikpen INJECT UP TO 100  UNITS SUBCUTANEOUSLY 30 MINUTES BEFORE MEALS. (Patient taking differently: INJECT UP TO 80 UNITS SUBCUTANEOUSLY 30 MINUTES BEFORE MEALS.)  . lamoTRIgine (LAMICTAL) 200 MG tablet TAKE 1 TABLET BY MOUTH ONCE A DAY.  Marland Kitchen lisinopril (PRINIVIL,ZESTRIL) 5 MG tablet TAKE 1 TABLET BY MOUTH ONCE A DAY.  . pantoprazole (PROTONIX) 40 MG tablet TAKE (1) TABLET BY MOUTH TWICE DAILY.  . simvastatin (ZOCOR) 40 MG tablet TAKE 1 TABLET BY MOUTH ONCE A DAY.  Marland Kitchen Testosterone (ANDROGEL PUMP TD) Place 75 g onto the skin every morning.  . traMADol (ULTRAM) 50 MG tablet Take 100 mg by mouth every 6 (six) hours as needed.  . TRAVATAN Z 0.004 % SOLN ophthalmic solution Place 1 drop into both eyes at bedtime.    . triamcinolone ointment (KENALOG) 0.1 % Apply 1 application topically 2 (two) times daily.  Marland Kitchen VICTOZA 18 MG/3ML SOPN INJECT 1.8 MG SUBCUTANEOUSLY ONCE DAILY.  Marland Kitchen Vitamin D, Ergocalciferol, (DRISDOL) 50000 units CAPS capsule TAKE ONE CAPSULE BY MOUTH ONCE WEEKLY.  . [DISCONTINUED] baclofen (LIORESAL) 10 MG tablet Take 10 mg by mouth daily as needed.   . [DISCONTINUED] gabapentin (NEURONTIN) 400 MG capsule Take 400 mg by mouth 2 (two) times daily.   . [DISCONTINUED] ketoconazole (NIZORAL) 2 % shampoo APPLY TOPICALLY TWICE WEEKLY AS DIRECTED.   No facility-administered encounter medications on file as of 01/10/2016.   ALLERGIES: No Known Allergies VACCINATION STATUS: Immunization History  Administered Date(s) Administered  . Influenza Whole 04/01/2010  . Influenza,inj,Quad PF,36+ Mos 03/08/2013  . Pneumococcal Conjugate-13 07/09/2014  . Pneumococcal Polysaccharide-23 04/01/2010, 10/07/2015  . Tdap 11/24/2011  . Zoster 12/12/2014    Diabetes He presents for his follow-up diabetic visit. He has type 2 diabetes mellitus. His disease course has been improving. There are no hypoglycemic associated symptoms. Pertinent negatives for hypoglycemia include no confusion, headaches, pallor or seizures. There are no diabetic associated symptoms. Pertinent negatives for diabetes include no chest pain, no fatigue, no polydipsia, no polyphagia, no polyuria and no weakness. There are no hypoglycemic complications. Symptoms are improving. There are no diabetic complications. He is compliant with treatment most of the time. His weight is increasing steadily. He is following a generally unhealthy diet. He rarely participates in exercise. His home blood glucose trend is decreasing steadily. His breakfast blood glucose range is generally 130-140 mg/dl. His lunch blood glucose range is generally 130-140 mg/dl. His dinner blood glucose range is generally 130-140 mg/dl. His overall blood glucose range is 130-140 mg/dl.  An ACE inhibitor/angiotensin II receptor blocker is being taken.  Hyperlipidemia This is a chronic problem. The current episode started more than 1 year ago. Exacerbating diseases include diabetes and obesity. Pertinent negatives include no chest pain, myalgias or shortness of breath. Current antihyperlipidemic treatment includes statins. Risk factors for coronary artery disease include a sedentary lifestyle, male sex, diabetes mellitus, dyslipidemia and obesity.     Review of Systems  Constitutional: Negative for fatigue and unexpected weight change.  HENT: Negative for dental problem, mouth sores and trouble swallowing.   Eyes: Negative for visual disturbance.  Respiratory: Negative for cough, choking, chest tightness, shortness of breath and wheezing.   Cardiovascular: Negative for chest pain, palpitations and leg swelling.  Gastrointestinal: Negative for nausea, vomiting, abdominal pain, diarrhea, constipation and abdominal distention.  Endocrine: Negative for polydipsia, polyphagia and polyuria.  Genitourinary: Negative for dysuria, urgency, hematuria and flank pain.  Musculoskeletal: Negative for myalgias, back pain, gait problem and neck pain.  Skin: Negative for pallor, rash and wound.  Neurological:  Negative for seizures, syncope, weakness, numbness and headaches.  Psychiatric/Behavioral: Negative.  Negative for confusion and dysphoric mood.    Objective:    BP 114/71 mmHg  Pulse 89  Ht 6' (1.829 m)  Wt 450 lb (204.119 kg)  BMI 61.02 kg/m2  Wt Readings from Last 3 Encounters:  01/10/16 450 lb (204.119 kg)  12/31/15 440 lb (199.583 kg)  10/10/15 440 lb (199.583 kg)    Physical Exam  Constitutional: He is oriented to person, place, and time. He appears well-developed and well-nourished. He is cooperative. No distress.  HENT:  Head: Normocephalic and atraumatic.  Eyes: EOM are normal.  Neck: Normal range of motion. Neck supple. No tracheal deviation present. No thyromegaly  present.  Cardiovascular: Normal rate, S1 normal, S2 normal and normal heart sounds.  Exam reveals no gallop.   No murmur heard. Pulses:      Dorsalis pedis pulses are 1+ on the right side, and 1+ on the left side.       Posterior tibial pulses are 1+ on the right side, and 1+ on the left side.  Pulmonary/Chest: Breath sounds normal. No respiratory distress. He has no wheezes.  Abdominal: Soft. Bowel sounds are normal. He exhibits no distension. There is no tenderness. There is no guarding and no CVA tenderness.  Musculoskeletal: He exhibits no edema.       Right shoulder: He exhibits no swelling and no deformity.  Large lower extremities with skin dryness and lateral eczema on the left lower leg.  Neurological: He is alert and oriented to person, place, and time. He has normal strength and normal reflexes. No cranial nerve deficit or sensory deficit. Gait normal.  Skin: Skin is warm and dry. No rash noted. No cyanosis. Nails show no clubbing.  He has extensive tattoos.  Psychiatric: He has a normal mood and affect. His speech is normal and behavior is normal. Judgment and thought content normal. Cognition and memory are normal.    Results for orders placed or performed in visit on 01/03/16  COMPLETE METABOLIC PANEL WITH GFR  Result Value Ref Range   Sodium 139 135 - 146 mmol/L   Potassium 5.4 (H) 3.5 - 5.3 mmol/L   Chloride 104 98 - 110 mmol/L   CO2 25 20 - 31 mmol/L   Glucose, Bld 274 (H) 65 - 99 mg/dL   BUN 22 7 - 25 mg/dL   Creat 1.09 0.70 - 1.25 mg/dL   Total Bilirubin 0.4 0.2 - 1.2 mg/dL   Alkaline Phosphatase 57 40 - 115 U/L   AST 15 10 - 35 U/L   ALT 18 9 - 46 U/L   Total Protein 6.0 (L) 6.1 - 8.1 g/dL   Albumin 3.7 3.6 - 5.1 g/dL   Calcium 8.8 8.6 - 10.3 mg/dL   GFR, Est African American 84 >=60 mL/min   GFR, Est Non African American 73 >=60 mL/min  Hemoglobin A1c  Result Value Ref Range   Hgb A1c MFr Bld 6.5 (H) <5.7 %   Mean Plasma Glucose 140 mg/dL  Diabetic Labs  (most recent): Lab Results  Component Value Date   HGBA1C 6.5* 01/03/2016   HGBA1C 6.3* 10/01/2015   HGBA1C 8.0* 07/02/2015   Lipid Panel     Component Value Date/Time   CHOL 150 10/30/2015 1112   TRIG 124 10/30/2015 1112   HDL 48 10/30/2015 1112   CHOLHDL 3.1 10/30/2015 1112   VLDL 25 10/30/2015 1112   LDLCALC 77 10/30/2015 1112    Assessment &  Plan:   1. 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  Patient came with Humulin U500 insulin pens for demonstration. His recent A1c of 6.5% generally improving from 9 %.  He is on U500 using 80 units TIDAC. -He did  Not have hypoglycemia .  - Patient remains at a high risk for more acute and chronic complications of diabetes which include CAD, CVA, CKD, retinopathy, and neuropathy. These are all discussed in detail with the patient.  - I have re-counseled the patient on diet management and weight loss  by adopting a carbohydrate restricted / protein rich  Diet. - Patient is advised to stick to a routine mealtimes to eat 3 meals  a day and avoid unnecessary snacks ( to snack only to correct hypoglycemia).  - Suggestion is made for patient to avoid simple carbohydrates   from their diet including Cakes , Desserts, Ice Cream,  Soda (  diet and regular) , Sweet Tea , Candies,  Chips, Cookies, Artificial Sweeteners,   and "Sugar-free" Products .  This will help patient to have stable blood glucose profile and potentially avoid unintended  Weight gain.  - The patient  has been  scheduled with Jearld Fenton, RDN, CDE for individualized DM education. - I have approached patient with the following individualized plan to manage diabetes and patient agrees.  - I will continue his Humulin U500 pen to 80 units 3 times a day before meals for pre-meal blood glucose above 90 mg/dL associated with strict monitoring of glucose  AC and HS. - Patient is warned not to take  insulin without proper monitoring per orders.  -Patient is encouraged to call clinic for blood glucose levels less than 70 or above 300 mg /dl. - I will continue Victoza 1.8 mg subcutaneous daily, therapeutically suitable for patient.. -Patient does not tolerate metformin.   - Patient specific target  for A1c; LDL, HDL, Triglycerides, and  Waist Circumference were discussed in detail.  2) BP/HTN: Controlled. Continue current medications including ACEI. 3) Lipids/HPL:  continue statins. 4)  Weight/Diet: CDE consult in progress, exercise, and carbohydrates information provided.  5) Chronic Care/Health Maintenance:  -Patient is on ACEI and Statin medications and encouraged to continue to follow up with Ophthalmology, Podiatrist at least yearly or according to recommendations, and advised to  stay away from smoking. I have recommended yearly flu vaccine and pneumonia vaccination at least every 5 years; moderate intensity exercise for up to 150 minutes weekly; and  sleep for at least 7 hours a day. I refilled his Kenalog ointment given to him for bilateral lower extremity dryness and eczema  .  I advised patient to maintain close follow up with their PCP for primary care needs.  Patient is asked to bring meter and  blood glucose logs during their next visit.   Follow up plan: Return in about 3 months (around 04/11/2016) for diabetes, high blood pressure, high cholesterol, follow up with pre-visit labs, meter, and logs.  Glade Lloyd, MD Phone: [phone]  Fax: [phone]   01/10/2016, 11:48 AM

## 2016-01-20 ENCOUNTER — Other Ambulatory Visit: Payer: Self-pay | Admitting: Family Medicine

## 2016-01-24 ENCOUNTER — Telehealth: Payer: Self-pay | Admitting: Family Medicine

## 2016-01-24 ENCOUNTER — Other Ambulatory Visit: Payer: Self-pay

## 2016-01-24 MED ORDER — GABAPENTIN 600 MG PO TABS: 600.0000 mg | ORAL_TABLET | Freq: Three times a day (TID) | ORAL | 2 refills | Status: DC

## 2016-01-24 NOTE — Telephone Encounter (Signed)
Do you want to try him on lyrica since he's tried and failed gabapentin?

## 2016-01-24 NOTE — Telephone Encounter (Signed)
Pls inc the gsbapentin to 600 mg three times daily, send in 3 monthsd, has appt in Sept which he should keep  Call if he needs me sooner

## 2016-01-24 NOTE — Telephone Encounter (Signed)
Nicholas [name] is calling c/o recurring Lft Foot Pain, was seen in the ER on 12/31/15 and was diagnosed with Lft foot neuropathy, he was given Tramadol and his Gabapentin was increased to 400 mg. He is asking for something stronger and he is also asking does he need to continue  the 400 mg of Gabapentin or go back to the dose Dr. Moshe Cipro prescribed the 300mg  , please advise?

## 2016-01-27 ENCOUNTER — Ambulatory Visit (INDEPENDENT_AMBULATORY_CARE_PROVIDER_SITE_OTHER): Payer: PPO | Admitting: Sports Medicine

## 2016-01-27 ENCOUNTER — Encounter: Payer: Self-pay | Admitting: Sports Medicine

## 2016-01-27 DIAGNOSIS — L97519 Non-pressure chronic ulcer of other part of right foot with unspecified severity: Secondary | ICD-10-CM

## 2016-01-27 DIAGNOSIS — E11621 Type 2 diabetes mellitus with foot ulcer: Secondary | ICD-10-CM

## 2016-01-27 DIAGNOSIS — M2041 Other hammer toe(s) (acquired), right foot: Secondary | ICD-10-CM

## 2016-01-27 DIAGNOSIS — M2010 Hallux valgus (acquired), unspecified foot: Secondary | ICD-10-CM

## 2016-01-27 DIAGNOSIS — E1149 Type 2 diabetes mellitus with other diabetic neurological complication: Secondary | ICD-10-CM

## 2016-01-27 DIAGNOSIS — M79676 Pain in unspecified toe(s): Secondary | ICD-10-CM

## 2016-01-27 DIAGNOSIS — M2042 Other hammer toe(s) (acquired), left foot: Secondary | ICD-10-CM | POA: Diagnosis not present

## 2016-01-27 DIAGNOSIS — B351 Tinea unguium: Secondary | ICD-10-CM | POA: Diagnosis not present

## 2016-01-27 DIAGNOSIS — I739 Peripheral vascular disease, unspecified: Secondary | ICD-10-CM

## 2016-01-27 DIAGNOSIS — L84 Corns and callosities: Secondary | ICD-10-CM

## 2016-01-27 DIAGNOSIS — M21829 Other specified acquired deformities of unspecified upper arm: Secondary | ICD-10-CM

## 2016-01-27 NOTE — Progress Notes (Signed)
Patient ID: Nicholas [name], male   DOB: 1955/10/15, 60 y.o.   MRN: [id number]  Subjective: Nicholas [name] is a 60 y.o. male patient seen in office for nail trim and for follow up evaluation of ulceration of the right big toe;states it has calloused over with no drainage. Patient has a history of diabetes and a blood glucose level today that has been good. Denies nausea/fever/vomiting/chills/night sweats/shortness of breath/pain. Patient has no other pedal complaints at this time. Admits around July 4th went to ER for worsening neuropathy; since increased dosage on Gabapentin.   Patient is also here to pick up diabetic shoes.    Patient Active Problem List   Diagnosis Date Noted  . Skin lesion of back 10/07/2015  . Back pain with left-sided radiculopathy 06/05/2015  . Hyperlipidemia 04/10/2015  . Benign hypertension 04/10/2015  . Abdominal wall bulge 01/15/2015  . Medicare annual wellness visit, subsequent 10/15/2014  . Wrist pain, right 10/15/2014  . Atypical chest pain 07/22/2014  . Hx of adenomatous colonic polyps 02/21/2014  . Candidiasis 11/28/2013  . Allergic rhinitis 07/24/2013  . Cough due to bronchospasm 05/24/2013  . Onychomycosis 05/21/2013  . 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 Back pain with radiation 04/20/2011  . Seborrheic dermatitis 11/20/2010  . Dermatomycosis 11/20/2010  . FATIGUE 04/01/2010  . CARPAL TUNNEL SYNDROME, BILATERAL 03/24/2010  . Hypogonadism in male 01/27/2010  . DEPRESSION/ANXIETY 01/27/2010  . CHF 01/13/2010  . DYSPNEA 01/13/2010  . Type 2 diabetes mellitus with diabetic nephropathy ([place]) 12/12/2009  . MORBID OBESITY 12/12/2009  . Benign hypertension 12/12/2009  . PERIPHERAL VASCULAR INSUFFICIENCY,  LEGS, BILATERAL 12/12/2009   Current Outpatient Prescriptions on File Prior to Visit  Medication Sig Dispense Refill  . aspirin (ASPIRIN LOW DOSE) 81 MG EC tablet Take 81 mg by mouth daily. Take one tablet by mouth  once a day     . brimonidine (ALPHAGAN P) 0.1 % SOLN Place 1 drop into both eyes 2 (two) times daily. One drop in each eye two times a day     . brimonidine-timolol (COMBIGAN) 0.2-0.5 % ophthalmic solution Place 1 drop into both eyes every 12 (twelve) hours. One drop in each eye two times a day      . citalopram (CELEXA) 40 MG tablet TAKE 1 TABLET BY MOUTH ONCE A DAY. 30 tablet 3  . clotrimazole-betamethasone (LOTRISONE) cream APPLY TO AFFECTED AREAS TWICE DAILY. 45 g 0  . furosemide (LASIX) 20 MG tablet Take 1 tablet (20 mg total) by mouth daily. (Patient taking differently: Take 20 mg by mouth daily as needed. ) 30 tablet 1  . gabapentin (NEURONTIN) 600 MG tablet Take 1 tablet (600 mg total) by mouth 3 (three) times daily. 90 tablet 2  . HUMULIN R U-500 KWIKPEN 500 UNIT/ML kwikpen INJECT UP TO 100 UNITS SUBCUTANEOUSLY 30 MINUTES BEFORE MEALS. (Patient taking differently: INJECT UP TO 80 UNITS SUBCUTANEOUSLY 30 MINUTES BEFORE MEALS.) 18 mL 2  . lamoTRIgine (LAMICTAL) 200 MG tablet TAKE 1 TABLET BY MOUTH ONCE A DAY. 30 tablet 0  . lisinopril (PRINIVIL,ZESTRIL) 5 MG tablet TAKE 1 TABLET BY MOUTH ONCE A DAY. 30 tablet 0  . pantoprazole (PROTONIX) 40 MG tablet TAKE (1) TABLET BY MOUTH TWICE DAILY. 60 tablet 5  . simvastatin (ZOCOR) 40 MG tablet TAKE 1 TABLET BY MOUTH ONCE A DAY. 30 tablet 3  . Testosterone (ANDROGEL PUMP TD) Place 75 g onto the skin every morning.    . traMADol (ULTRAM) 50  MG tablet Take 100 mg by mouth every 6 (six) hours as needed.    . TRAVATAN Z 0.004 % SOLN ophthalmic solution Place 1 drop into both eyes at bedtime.     . triamcinolone ointment (KENALOG) 0.1 % Apply 1 application topically 2 (two) times daily. 454 g 0  . VICTOZA 18 MG/3ML SOPN INJECT 1.8 MG SUBCUTANEOUSLY ONCE DAILY. 9 mL 2  . Vitamin D, Ergocalciferol, (DRISDOL) 50000 units CAPS capsule TAKE ONE CAPSULE BY MOUTH ONCE WEEKLY. 12 capsule 0   No current facility-administered medications on file prior to visit.     No Known Allergies  Recent Results (from the past 2160 hour(s))  HIV antibody     Status: None   Collection Time: 10/30/15 11:12 AM  Result Value Ref Range   HIV 1&2 Ab, 4th Generation NONREACTIVE NONREACTIVE    Comment:   HIV-1 antigen and HIV-1/HIV-2 antibodies were not detected.  There is no laboratory evidence of HIV infection.   HIV-1/2 Antibody Diff        Not indicated. HIV-1 RNA, Qual TMA          Not indicated.     PLEASE NOTE: This information has been disclosed to you from records whose confidentiality may be protected by state law. If your state requires such protection, then the state law prohibits you from making any further disclosure of the information without the specific written consent of the person to whom it pertains, or as otherwise permitted by law. A general authorization for the release of medical or other information is NOT sufficient for this purpose.   The performance of this assay has not been clinically validated in patients less than 90 years old.   For additional information please refer to http://education.questdiagnostics.com/faq/FAQ106.  (This link is being provided for informational/educational purposes only.)     Hepatitis C antibody     Status: None   Collection Time: 10/30/15 11:12 AM  Result Value Ref Range   HCV Ab NEGATIVE NEGATIVE  Lipid panel     Status: None   Collection Time: 10/30/15 11:12 AM  Result Value Ref Range   Cholesterol 150 125 - 200 mg/dL   Triglycerides 124 <150 mg/dL   HDL 48 >=40 mg/dL   Total CHOL/HDL Ratio 3.1 <=5.0 Ratio   VLDL 25 <30 mg/dL   LDL Cholesterol 77 <130 mg/dL    Comment:   Total Cholesterol/HDL Ratio:CHD Risk                        Coronary Heart Disease Risk Table                                        Men       Women          1/2 Average Risk              3.4        3.3              Average Risk              5.0        4.4           2X Average Risk              9.6        7.1  3X Average Risk             23.4       11.0 Use the calculated Patient Ratio above and the CHD Risk table  to determine the patient's CHD Risk.   VITAMIN D 25 Hydroxy (Vit-D Deficiency, Fractures)     Status: None   Collection Time: 10/30/15 11:12 AM  Result Value Ref Range   Vit D, 25-Hydroxy 43 30 - 100 ng/mL    Comment: Vitamin D Status           25-OH Vitamin D        Deficiency                <20 ng/mL        Insufficiency         20 - 29 ng/mL        Optimal             > or = 30 ng/mL   For 25-OH Vitamin D testing on patients on D2-supplementation and patients for whom quantitation of D2 and D3 fractions is required, the [facility] 25-OH VIT D, (D2,D3), LC/MS/MS is recommended: order code [id number] (patients > 2 yrs).   Hepatic function panel     Status: None   Collection Time: 10/30/15 11:12 AM  Result Value Ref Range   Total Bilirubin 0.4 0.2 - 1.2 mg/dL   Bilirubin, Direct 0.1 <=0.2 mg/dL   Indirect Bilirubin 0.3 0.2 - 1.2 mg/dL   Alkaline Phosphatase 68 40 - 115 U/L   AST 22 10 - 35 U/L   ALT 22 9 - 46 U/L   Total Protein 6.2 6.1 - 8.1 g/dL   Albumin 4.0 3.6 - 5.1 g/dL  CBC with Differential     Status: Abnormal   Collection Time: 12/31/15 10:22 AM  Result Value Ref Range   WBC 10.5 4.0 - 10.5 K/uL   RBC 5.13 4.22 - 5.81 MIL/uL   Hemoglobin 14.9 13.0 - 17.0 g/dL   HCT 47.1 39.0 - 52.0 %   MCV 91.8 78.0 - 100.0 fL   MCH 29.0 26.0 - 34.0 pg   MCHC 31.6 30.0 - 36.0 g/dL   RDW 15.3 11.5 - 15.5 %   Platelets 189 150 - 400 K/uL   Neutrophils Relative % 75 %   Neutro Abs 7.8 (H) 1.7 - 7.7 K/uL   Lymphocytes Relative 15 %   Lymphs Abs 1.6 0.7 - 4.0 K/uL   Monocytes Relative 8 %   Monocytes Absolute 0.8 0.1 - 1.0 K/uL   Eosinophils Relative 2 %   Eosinophils Absolute 0.3 0.0 - 0.7 K/uL   Basophils Relative 0 %   Basophils Absolute 0.0 0.0 - 0.1 K/uL  Basic metabolic panel     Status: Abnormal   Collection Time: 12/31/15 10:22 AM  Result Value Ref Range   Sodium  137 135 - 145 mmol/L   Potassium 4.5 3.5 - 5.1 mmol/L   Chloride 107 101 - 111 mmol/L   CO2 24 22 - 32 mmol/L   Glucose, Bld 206 (H) 65 - 99 mg/dL   BUN 17 6 - 20 mg/dL   Creatinine, Ser 1.11 0.61 - 1.24 mg/dL   Calcium 8.7 (L) 8.9 - 10.3 mg/dL   GFR calc non Af Amer >60 >60 mL/min   GFR calc Af Amer >60 >60 mL/min    Comment: (NOTE) The eGFR has been calculated using the CKD EPI equation. This calculation has not been validated in  all clinical situations. eGFR's persistently <60 mL/min signify possible Chronic Kidney Disease.    Anion gap 6 5 - 15  COMPLETE METABOLIC PANEL WITH GFR     Status: Abnormal   Collection Time: 01/03/16  9:52 AM  Result Value Ref Range   Sodium 139 135 - 146 mmol/L   Potassium 5.4 (H) 3.5 - 5.3 mmol/L    Comment: No visible hemolysis.   Chloride 104 98 - 110 mmol/L   CO2 25 20 - 31 mmol/L   Glucose, Bld 274 (H) 65 - 99 mg/dL   BUN 22 7 - 25 mg/dL   Creat 1.09 0.70 - 1.25 mg/dL    Comment:   For patients > or = 61 years of age: The upper reference limit for Creatinine is approximately 13% higher for people identified as African-American.      Total Bilirubin 0.4 0.2 - 1.2 mg/dL   Alkaline Phosphatase 57 40 - 115 U/L   AST 15 10 - 35 U/L   ALT 18 9 - 46 U/L   Total Protein 6.0 (L) 6.1 - 8.1 g/dL   Albumin 3.7 3.6 - 5.1 g/dL   Calcium 8.8 8.6 - 10.3 mg/dL   GFR, Est African American 84 >=60 mL/min   GFR, Est Non African American 73 >=60 mL/min  Hemoglobin A1c     Status: Abnormal   Collection Time: 01/03/16  9:52 AM  Result Value Ref Range   Hgb A1c MFr Bld 6.5 (H) <5.7 %    Comment:   For someone without known diabetes, a hemoglobin A1c value of 6.5% or greater indicates that they may have diabetes and this should be confirmed with a follow-up test.   For someone with known diabetes, a value <7% indicates that their diabetes is well controlled and a value greater than or equal to 7% indicates suboptimal control. A1c targets should be  individualized based on duration of diabetes, age, comorbid conditions, and other considerations.   Currently, no consensus exists for use of hemoglobin A1c for diagnosis of diabetes for children.      Mean Plasma Glucose 140 mg/dL    Objective: There were no vitals filed for this visit.  General: Patient is awake, alert, oriented x 3 and in no acute distress.  Dermatology: Skin is warm and dry bilateral with a continued healed ulceration plantar right hallux with reactive keratosis. No acute signs of infection. Nails are elonagted and thickened x 10 with no signs of ingrowing or infection.    Vascular: Dorsalis Pedis pulse = 0/4 Bilateral,  Posterior Tibial pulse = 1/4 Bilateral faint,  Capillary Fill Time < 5 seconds, + chronic venous skin changes bilateral   Neurologic:Protective sensation diminished using the 5.07/10g Semmes Weinstein Monofilament. Vibratory diminished bilateral.   Musculosketal:  No Pain with palpation to previous ulcerated area at right hallux. + Hallux interphalangeus R>L hallux, No pain with compression to calves bilateral.   Assessment and Plan:  Problem List Items Addressed This Visit    None    Visit Diagnoses    Diabetic ulcer of right foot associated with type 2 diabetes mellitus ([place])    -  Primary   healed   PVD (peripheral vascular disease) ([place])       Type II diabetes mellitus with neurological manifestations ([place])       Dermatophytosis of nail       Pain of toe, unspecified laterality       Hallux interphalangeus, acquired, unspecified laterality  Acquired hammer toes of both feet         -Examined patient  -Parred keratotic lesion at right hallux using sterile chisel blade; previous hallux ulceration healed -Continue with toe cushion right hallux as tolerated -Mechanically debrided nails x 10 using sterile nipper without incident -Patient met with medical assistant and diabetic shoes were dispensed -Advised patient to closely  monitor for signs of infection or re-occurrence of ulceration  -Continue with gabapentin  -Patient to return to office in 8-12 weeks for follow up care and evaluation or sooner if problems arise.  Landis Martins, DPM

## 2016-01-27 NOTE — Telephone Encounter (Signed)
Inc med not helping- made appt for tomorrow

## 2016-01-27 NOTE — Progress Notes (Signed)
Patient presents for diabetic shoe pick up, shoes are tried on for good fit.  Patient received 1 pair Apex A500M Athletic bungee lace black in men's size 12 wide and 3 pairs custom molded diabetic inserts.  Verbal and written break in and wear instructions given.  Patient will follow up for scheduled routine care.

## 2016-01-27 NOTE — Patient Instructions (Signed)

## 2016-01-28 ENCOUNTER — Encounter: Payer: Self-pay | Admitting: Family Medicine

## 2016-01-28 ENCOUNTER — Ambulatory Visit (INDEPENDENT_AMBULATORY_CARE_PROVIDER_SITE_OTHER): Payer: PPO | Admitting: Family Medicine

## 2016-01-28 VITALS — BP 130/80 | HR 84 | Resp 16 | Ht 72.0 in | Wt >= 6400 oz

## 2016-01-28 DIAGNOSIS — G250 Essential tremor: Secondary | ICD-10-CM | POA: Diagnosis not present

## 2016-01-28 DIAGNOSIS — I10 Essential (primary) hypertension: Secondary | ICD-10-CM | POA: Diagnosis not present

## 2016-01-28 DIAGNOSIS — M5410 Radiculopathy, site unspecified: Secondary | ICD-10-CM

## 2016-01-28 DIAGNOSIS — F341 Dysthymic disorder: Secondary | ICD-10-CM

## 2016-01-28 DIAGNOSIS — E11628 Type 2 diabetes mellitus with other skin complications: Secondary | ICD-10-CM

## 2016-01-28 DIAGNOSIS — M79672 Pain in left foot: Secondary | ICD-10-CM

## 2016-01-28 DIAGNOSIS — E11610 Type 2 diabetes mellitus with diabetic neuropathic arthropathy: Secondary | ICD-10-CM | POA: Insufficient documentation

## 2016-01-28 DIAGNOSIS — L84 Corns and callosities: Secondary | ICD-10-CM

## 2016-01-28 MED ORDER — NAPROXEN 500 MG PO TABS: ORAL_TABLET | ORAL | 4 refills | Status: DC

## 2016-01-28 MED ORDER — METOPROLOL SUCCINATE ER 25 MG PO TB24: 25.0000 mg | ORAL_TABLET | Freq: Every day | ORAL | 4 refills | Status: DC

## 2016-01-28 NOTE — Patient Instructions (Signed)
Annual wellness in September as before, call if you need me sooner.  New for pain  Is naproxen  New for tremor is toprol, you will need to see Dr Merlene Laughter, call for appt  Please work on good  health habits so that your health will improve. 1. Commitment to daily physical activity for 30 to 60  minutes, if you are able to do this.  2. Commitment to wise food choices. Aim for half of your  food intake to be vegetable and fruit, one quarter starchy foods, and one quarter protein. Try to eat on a regular schedule  3 meals per day, snacking between meals should be limited to vegetables or fruits or small portions of nuts. 64 ounces of water per day is generally recommended, unless you have specific health conditions, like heart failure or kidney failure where you will need to limit fluid intake.  3. Commitment to sufficient and a  good quality of physical and mental rest daily, generally between 6 to 8 hours per day.  WITH PERSISTANCE AND PERSEVERANCE, THE IMPOSSIBLE , BECOMES THE NORM!   Thank you  for choosing [facility]. We consider it a privelige to serve you.  Delivering excellent health care in a caring and  compassionate way is our goal.  Partnering with you,  so that together we can achieve this goal is our strategy.

## 2016-01-28 NOTE — Assessment & Plan Note (Addendum)
1 month h/o left heel pain naproxen helped, normal kidney function and BP

## 2016-02-05 ENCOUNTER — Other Ambulatory Visit: Payer: Self-pay | Admitting: "Endocrinology

## 2016-02-12 NOTE — Assessment & Plan Note (Signed)
Deteriorated. Patient re-educated about  the importance of commitment to a  minimum of 150 minutes of exercise per week.  The importance of healthy food choices with portion control discussed. Encouraged to start a food diary, count calories and to consider  joining a support group. Sample diet sheets offered. Goals set by the patient for the next several months.   Weight /BMI 01/28/2016 01/10/2016 12/31/2015  WEIGHT 457 lb 450 lb 440 lb  HEIGHT 6\' 0"  6\' 0"  6\' 0"   BMI 61.98 kg/m2 61.02 kg/m2 59.66 kg/m2

## 2016-02-12 NOTE — Assessment & Plan Note (Signed)
Controlled, no change in medication DASH diet and commitment to daily physical activity for a minimum of 30 minutes discussed and encouraged, as a part of hypertension management. The importance of attaining a healthy weight is also discussed.  BP/Weight 01/28/2016 01/10/2016 12/31/2015 10/29/2015 10/10/2015 10/08/2015 [id number]  Systolic BP [id number]  Diastolic BP 80 71 65 [id number] 70 62 68  Wt. (Lbs) 457 450 440 - 440 - 436  BMI 61.98 61.02 59.66 - 59.66 - 59.12

## 2016-02-12 NOTE — Progress Notes (Signed)
Nicholas [name]     MRN: [id number]      DOB: [birth year]   HPI Nicholas [name] is here for follow up and re-evaluation of chronic medical conditions, medication management and review of any available recent lab and radiology data.  Preventive health is updated, specifically  Cancer screening and Immunization.   Questions or concerns regarding consultations or procedures which the PT has had in the interim are  addressed. The PT reports that his back, lower extremity and generalized pain are worsening, the gabapentin is not helping and he has a 1 month h/o left heeel pain "tried " an anti inflammatory, naproxen which helped a lot , once to be started on this as maintainance. Kidney function and blood pressure are both good and he does have severe arthritis Also requests "lift chair"  For use in the home, as getting up from a sitting position is extremely painful and risky, due to spinal disease and generalized osteoarthritis. C/o new tremor in hands which making it difficult at times to hold objects Denies polyuria, polydipsia, blurred vision , or hypoglycemic episodes. Excellent blood sugar , hBA1C 6.5 in 12/2015   ROS Denies recent fever or chills. Denies sinus pressure, nasal congestion, ear pain or sore throat. Denies chest congestion, productive cough or wheezing. Denies chest pains, palpitations and leg swelling Denies abdominal pain, nausea, vomiting,diarrhea or constipation.   Denies dysuria, frequency, hesitancy or incontinence. Denies headaches, seizures,  Denies uncontrolled  depression, anxiety or insomnia. Denies skin break down or rash.   PE  BP 130/80 (BP Location: Left Arm, Patient Position: Sitting, Cuff Size: Large)   Pulse 84   Resp 16   Ht 6' (1.829 m)   Wt (!) 457 lb (207.3 kg)   SpO2 94%   BMI 61.98 kg/m   Patient alert and oriented and in no cardiopulmonary distress.  HEENT: No facial asymmetry, EOMI,   oropharynx pink and moist.  Neck supple no JVD,  no mass.  Chest: Clear to auscultation bilaterally.  CVS: S1, S2 no murmurs, no S3.Regular rate.  ABD: Soft non tender.   Ext: No edema  MS: Markedly decreased  ROM spine, shoulders, hips and knees. Skin: Intact,   Psych: Good eye contact, normal affect. Memory intact not anxious or depressed appearing.  CNS: CN 2-12 intact,   Assessment & Plan  Pain of left heel 1 month h/o left heel pain naproxen helped, normal kidney function and BP  Back pain with left-sided radiculopathy Increased back pain, established sscoliosis and multilevel arthropathy,  with lower extremity weakness and  morbid obesity , resulting in increased mobility issues which make it increasing difficult for pt to safely raise himself out of a chair, will benefit and needs a lift chair in his home  Benign hypertension Controlled, no change in medication DASH diet and commitment to daily physical activity for a minimum of 30 minutes discussed and encouraged, as a part of hypertension management. The importance of attaining a healthy weight is also discussed.  BP/Weight 01/28/2016 01/10/2016 12/31/2015 10/29/2015 10/10/2015 10/08/2015 [id number]  Systolic BP [id number] 123456 [id number]  Diastolic BP 80 71 65 [id number] 70 62 68  Wt. 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   The need to take medication as prescribed, test blood sugar as directed, and to call between visits if there is a concern that blood sugar is uncontrolled is also discussed.   Nicholas [name] is reminded of the importance of daily foot exam, annual eye examination, and good blood sugar, blood pressure and cholesterol  control.  Diabetic Labs Latest Ref Rng & Units 01/03/2016 12/31/2015 10/30/2015 10/01/2015 07/02/2015  HbA1c <5.7 % 6.5(H) - - 6.3(H) 8.0(H)  Microalbumin <2.0 mg/dL - - - - -  Micro/Creat Ratio 0.0 - 30.0 mg/g - - - - -  Chol 125 - 200 mg/dL - - 150 - -  HDL >=40 mg/dL - - 48 - -  Calc LDL <130 mg/dL - - 77 - -  Triglycerides <150 mg/dL - - 124 - -  Creatinine 0.70 - 1.25 mg/dL 1.09 1.11 - 1.03 1.01   BP/Weight 01/28/2016 01/10/2016 12/31/2015 10/29/2015 10/10/2015 10/08/2015 [id number]  Systolic BP [id number] 123456 [id number] [id number]  Diastolic BP 80 71 65 [id number] 70 62 68  Wt. (Lbs) 457 450 440 - 440 - 436  BMI 61.98 61.02 59.66 - 59.66 - 59.12   Foot/eye exam completion dates Latest Ref Rng & Units 03/11/2015 07/09/2014  Eye Exam No Retinopathy No Retinopathy -  Foot exam Order - - -  Foot Form Completion - - Done        MORBID OBESITY Deteriorated. Patient re-educated about  the importance of commitment to a  minimum of 150 minutes of exercise per week.  The importance of healthy food choices with portion control discussed. Encouraged to start a food diary, count calories and to consider  joining a support group. Sample diet sheets offered. Goals set by the patient for the next several months.   Weight /BMI 01/28/2016 01/10/2016 12/31/2015  WEIGHT 457 lb 450 lb 440 lb  HEIGHT 6\' 0"  6\' 0"  6\' 0"   BMI 61.98 kg/m2 61.02 kg/m2 59.66 kg/m2      DEPRESSION/ANXIETY Controlled, no change in medication   Benign essential tremor Trial of metoprolol and f/u with neurology

## 2016-02-12 NOTE — Assessment & Plan Note (Signed)
Trial of metoprolol and f/u with neurology

## 2016-02-12 NOTE — Assessment & Plan Note (Signed)
Controlled, no change in medication  

## 2016-02-12 NOTE — Assessment & Plan Note (Signed)
Controlled, no change in medication Nicholas [name] is reminded of the importance of commitment to daily physical activity for 30 minutes or more, as able and the need to limit carbohydrate intake to 30 to 60 grams per meal to help with blood sugar control.   The need to take medication as prescribed, test blood sugar as directed, and to call between visits if there is a concern that blood sugar is uncontrolled is also discussed.   Nicholas [name] is reminded of the importance of daily foot exam, annual eye examination, and good blood sugar, blood pressure and cholesterol control.  Diabetic Labs Latest Ref Rng & Units 01/03/2016 12/31/2015 10/30/2015 10/01/2015 07/02/2015  HbA1c <5.7 % 6.5(H) - - 6.3(H) 8.0(H)  Microalbumin <2.0 mg/dL - - - - -  Micro/Creat Ratio 0.0 - 30.0 mg/g - - - - -  Chol 125 - 200 mg/dL - - 150 - -  HDL >=40 mg/dL - - 48 - -  Calc LDL <130 mg/dL - - 77 - -  Triglycerides <150 mg/dL - - 124 - -  Creatinine 0.70 - 1.25 mg/dL 1.09 1.11 - 1.03 1.01   BP/Weight 01/28/2016 01/10/2016 12/31/2015 10/29/2015 10/10/2015 10/08/2015 [id number]  Systolic BP [id number] 123456 [id number] [id number]  Diastolic BP 80 71 65 [id number] 70 62 68  Wt. (Lbs) 457 450 440 - 440 - 436  BMI 61.98 61.02 59.66 - 59.66 - 59.12   Foot/eye exam completion dates Latest Ref Rng & Units 03/11/2015 07/09/2014  Eye Exam No Retinopathy No Retinopathy -  Foot exam Order - - -  Foot Form Completion - - Done

## 2016-02-12 NOTE — Assessment & Plan Note (Signed)
Increased back pain, established sscoliosis and multilevel arthropathy,  with lower extremity weakness and  morbid obesity , resulting in increased mobility issues which make it increasing difficult for pt to safely raise himself out of a chair, will benefit and needs a lift chair in his home

## 2016-02-14 ENCOUNTER — Ambulatory Visit (INDEPENDENT_AMBULATORY_CARE_PROVIDER_SITE_OTHER): Payer: PPO | Admitting: Family Medicine

## 2016-02-14 ENCOUNTER — Telehealth: Payer: Self-pay | Admitting: Family Medicine

## 2016-02-14 ENCOUNTER — Encounter: Payer: Self-pay | Admitting: Family Medicine

## 2016-02-14 VITALS — BP 118/78 | HR 74 | Resp 16 | Ht 72.0 in | Wt >= 6400 oz

## 2016-02-14 DIAGNOSIS — E1121 Type 2 diabetes mellitus with diabetic nephropathy: Secondary | ICD-10-CM | POA: Diagnosis not present

## 2016-02-14 DIAGNOSIS — E669 Obesity, unspecified: Secondary | ICD-10-CM

## 2016-02-14 DIAGNOSIS — E119 Type 2 diabetes mellitus without complications: Secondary | ICD-10-CM | POA: Diagnosis not present

## 2016-02-14 DIAGNOSIS — M79672 Pain in left foot: Secondary | ICD-10-CM

## 2016-02-14 DIAGNOSIS — E1169 Type 2 diabetes mellitus with other specified complication: Secondary | ICD-10-CM

## 2016-02-14 DIAGNOSIS — L03116 Cellulitis of left lower limb: Secondary | ICD-10-CM | POA: Diagnosis not present

## 2016-02-14 MED ORDER — KETOROLAC TROMETHAMINE 60 MG/2ML IM SOLN
60.0000 mg | Freq: Once | INTRAMUSCULAR | Status: AC
  Administered 2016-02-14: 60 mg via INTRAMUSCULAR

## 2016-02-14 MED ORDER — CEFTRIAXONE SODIUM 1 G IJ SOLR
500.0000 mg | Freq: Once | INTRAMUSCULAR | Status: AC
  Administered 2016-02-14: 500 mg via INTRAMUSCULAR

## 2016-02-14 MED ORDER — NAPROXEN 500 MG PO TABS: 500.0000 mg | ORAL_TABLET | Freq: Two times a day (BID) | ORAL | 3 refills | Status: DC

## 2016-02-14 MED ORDER — DOXYCYCLINE HYCLATE 100 MG PO TABS: 100.0000 mg | ORAL_TABLET | Freq: Two times a day (BID) | ORAL | 0 refills | Status: DC

## 2016-02-14 NOTE — Assessment & Plan Note (Signed)
2 day h/o pain , redness and swelling of left foot, felt sharp pain between left 4th and 5th toes after walking outside 2 days ago, then symptoms developed Rocephin 500 mg iM an office followed by doxycycline for 10 days. Go to foot center if worersens

## 2016-02-14 NOTE — Telephone Encounter (Signed)
Greg is asking to be seen today, he states that his left foot is very swollen and painful  he is unable to walk on it, please advise?

## 2016-02-14 NOTE — Telephone Encounter (Signed)
May get 1:11m appt

## 2016-02-14 NOTE — Patient Instructions (Signed)
F/u as before, call if you need me sooner You are treat6ed for cellulitis of left foot. If not improved in next 3 days , you need to be seen at the foot center where you are normally followed   Foot exam shows poor circulation and sensation , so very important for you to protect feet at all times and examine them daily, use a mirror to help  Rocephin and doxycycline for skin infection Toradol and increased dose of antiinflammatory for pain  PLs reduce naproxen to once daily as soon as you are able to when pain is less

## 2016-02-14 NOTE — Telephone Encounter (Signed)
Please advise 

## 2016-02-14 NOTE — Assessment & Plan Note (Addendum)
uncontrolled on daily anti inflammatory , inc dose to twice daily but to reduce to once when able toradol in office

## 2016-02-14 NOTE — Telephone Encounter (Signed)
Patient aware and will come in for ov.

## 2016-02-16 NOTE — Progress Notes (Signed)
FLAVIAN MCILWAIN     MRN: [id number]      DOB: 10-03-1955   HPI Mr. Nicholas [name] is here with a 2 day h/o pain, redness and swelling of left foot to ankle. Feels as though he hurt the foot when walking to his mailbox 2 days prior. Pain especially between 4th nd 5th toes. No fever, chills or drainage, but very concerned as he "does not want to lose his leg"   ROS Denies recent fever or chills. Denies sinus pressure, nasal congestion, ear pain or sore throat. Denies chest congestion, productive cough or wheezing. Denies chest pains, palpitations and leg swelling Denies abdominal pain, nausea, vomiting,diarrhea or constipation.   Denies dysuria, frequency, hesitancy or incontinence. C/o increased and uncontrolled pain esp since injury, but has been using twice daily naproxen instead of daily and is requesting dose increase. Will be done for limited time with encouragement to keep dose at once daily as soon as able to reduce adverse s/e Denies headaches, seizures, numbness, or tingling. Denies depression, anxiety or insomnia.    PE  BP 118/78   Pulse 74   Resp 16   Ht 6' (1.829 m)   Wt (!) 440 lb (199.6 kg)   SpO2 95%   BMI 59.67 kg/m   Patient alert and oriented and in no cardiopulmonary distress.  HEENT: No facial asymmetry, EOMI,   oropharynx pink and moist.  Neck supple no JVD, no mass.  Chest: Clear to auscultation bilaterally.  CVS: S1, S2 no murmurs, no S3.Regular rate.  ABD: Soft non tender.   Ext: No edema  MS: decreased ROM spine,  hips and knees.  Skin:eryhtema , swelling and tenderness of dorsum of left foot, increased erythema between 4th and 5th toes, no open lesion seen, no drainage.  Psych: Good eye contact, normal affect. Memory intact not anxious or depressed appearing.  CNS: CN 2-12 intact, power,  normal throughout.no focal deficits noted.   Assessment & Plan  Cellulitis of left foot 2 day h/o pain , redness and swelling of left foot, felt sharp  pain between left 4th and 5th toes after walking outside 2 days ago, then symptoms developed Rocephin 500 mg iM an office followed by doxycycline for 10 days. Go to foot center if worersens  Pain of left heel  uncontrolled on daily anti inflammatory , inc dose to twice daily but to reduce to once when able toradol in office  Diabetes mellitus type 2 in obese ([place]) ttreated by endo Mr. Goh is reminded of the importance of commitment to daily physical activity for 30 minutes or more, as able and the need to limit carbohydrate intake to 30 to 60 grams per meal to help with blood sugar control.   The need to take medication as prescribed, test blood sugar as directed, and to call between visits if there is a concern that blood sugar is uncontrolled is also discussed.   Mr. Kotila is reminded of the importance of daily foot exam, annual eye examination, and good blood sugar, blood pressure and cholesterol control. controlled  Diabetic Labs Latest Ref Rng & Units 01/03/2016 12/31/2015 10/30/2015 10/01/2015 07/02/2015  HbA1c <5.7 % 6.5(H) - - 6.3(H) 8.0(H)  Microalbumin <2.0 mg/dL - - - - -  Micro/Creat Ratio 0.0 - 30.0 mg/g - - - - -  Chol 125 - 200 mg/dL - - 150 - -  HDL >=40 mg/dL - - 48 - -  Calc LDL <130 mg/dL - - 77 - -  Triglycerides <150 mg/dL - - 124 - -  Creatinine 0.70 - 1.25 mg/dL 1.09 1.11 - 1.03 1.01   BP/Weight 02/14/2016 01/28/2016 01/10/2016 12/31/2015 10/29/2015 10/10/2015 [id number]  Systolic BP 123456 [id number] 123456 [id number]  Diastolic BP 78 80 71 65 [id number] 70 62  Wt. (Lbs) 440 457 450 440 - 440 -  BMI 59.67 61.98 61.02 59.66 - 59.66 -   Foot/eye exam completion dates Latest Ref Rng & Units 02/14/2016 03/11/2015  Eye Exam No Retinopathy - No Retinopathy  Foot exam Order - - -  Foot Form Completion - Done -

## 2016-02-16 NOTE — Assessment & Plan Note (Signed)
ttreated by endo Nicholas [name] is reminded of the importance of commitment to daily physical activity for 30 minutes or more, as able and the need to limit carbohydrate intake to 30 to 60 grams per meal to help with blood sugar control.   The need to take medication as prescribed, test blood sugar as directed, and to call between visits if there is a concern that blood sugar is uncontrolled is also discussed.   Nicholas [name] is reminded of the importance of daily foot exam, annual eye examination, and good blood sugar, blood pressure and cholesterol control. controlled  Diabetic Labs Latest Ref Rng & Units 01/03/2016 12/31/2015 10/30/2015 10/01/2015 07/02/2015  HbA1c <5.7 % 6.5(H) - - 6.3(H) 8.0(H)  Microalbumin <2.0 mg/dL - - - - -  Micro/Creat Ratio 0.0 - 30.0 mg/g - - - - -  Chol 125 - 200 mg/dL - - 150 - -  HDL >=40 mg/dL - - 48 - -  Calc LDL <130 mg/dL - - 77 - -  Triglycerides <150 mg/dL - - 124 - -  Creatinine 0.70 - 1.25 mg/dL 1.09 1.11 - 1.03 1.01   BP/Weight 02/14/2016 01/28/2016 01/10/2016 12/31/2015 10/29/2015 10/10/2015 [id number]  Systolic BP 123456 [id number] [id number] 123456 [id number]  Diastolic BP 78 80 71 65 [id number] 70 62  Wt. (Lbs) 440 457 450 440 - 440 -  BMI 59.67 61.98 61.02 59.66 - 59.66 -   Foot/eye exam completion dates Latest Ref Rng & Units 02/14/2016 03/11/2015  Eye Exam No Retinopathy - No Retinopathy  Foot exam Order - - -  Foot Form Completion - Done -

## 2016-02-21 ENCOUNTER — Other Ambulatory Visit: Payer: Self-pay | Admitting: "Endocrinology

## 2016-02-21 ENCOUNTER — Other Ambulatory Visit: Payer: Self-pay | Admitting: Family Medicine

## 2016-02-24 ENCOUNTER — Other Ambulatory Visit: Payer: Self-pay | Admitting: Family Medicine

## 2016-02-26 ENCOUNTER — Encounter: Payer: Self-pay | Admitting: Internal Medicine

## 2016-03-03 ENCOUNTER — Telehealth: Payer: Self-pay | Admitting: Family Medicine

## 2016-03-03 ENCOUNTER — Encounter: Payer: Self-pay | Admitting: Family Medicine

## 2016-03-03 ENCOUNTER — Ambulatory Visit (INDEPENDENT_AMBULATORY_CARE_PROVIDER_SITE_OTHER): Payer: PPO | Admitting: Family Medicine

## 2016-03-03 VITALS — BP 130/80 | HR 76 | Resp 16 | Ht 72.0 in | Wt >= 6400 oz

## 2016-03-03 DIAGNOSIS — I70209 Unspecified atherosclerosis of native arteries of extremities, unspecified extremity: Secondary | ICD-10-CM | POA: Diagnosis not present

## 2016-03-03 DIAGNOSIS — I739 Peripheral vascular disease, unspecified: Secondary | ICD-10-CM

## 2016-03-03 DIAGNOSIS — L03119 Cellulitis of unspecified part of limb: Secondary | ICD-10-CM

## 2016-03-03 DIAGNOSIS — I10 Essential (primary) hypertension: Secondary | ICD-10-CM

## 2016-03-03 MED ORDER — CLINDAMYCIN HCL 300 MG PO CAPS: 300.0000 mg | ORAL_CAPSULE | Freq: Three times a day (TID) | ORAL | 0 refills | Status: DC

## 2016-03-03 MED ORDER — CEFTRIAXONE SODIUM 1 G IJ SOLR
500.0000 mg | Freq: Once | INTRAMUSCULAR | Status: AC
  Administered 2016-03-03: 500 mg via INTRAMUSCULAR

## 2016-03-03 NOTE — Progress Notes (Signed)
   Nicholas [name]     MRN: [id number]      DOB: 1956/03/27   HPI Nicholas [name] is here with a 5 day h/o increased pain , redness and swelling affecting left lower extremity, states that as soon as he finished the antibiotic, doxycycline,1 week ago, approx 4 days later he again noted redness , pain and swelling of left leg and foot , with possible drainage from an ulcer on the left leg Denies fever or chills, no trauma to foot  ROS Denies recent fever or chills. Denies sinus pressure, nasal congestion, ear pain or sore throat. Denies chest congestion, productive cough or wheezing.    PE  BP 130/80   Pulse 76   Resp 16   Ht 6' (1.829 m)   Wt (!) 442 lb (200.5 kg)   SpO2 92%   BMI 59.95 kg/m   Patient alert and oriented and in no cardiopulmonary distress.  HEENT: No facial asymmetry, EOMI,   oropharynx pink and moist.  Neck supple no JVD, no mass.  Chest: Clear to auscultation bilaterally.  CVS: S1, S2 no murmurs, no S3.Regular rate.  ABD: Soft non tender.   Ext: erythema and swelling of lLE, crusting . Scaly rash on anterior left leg, with small open area  MS: Decreased rOM spine, hips and knees    Assessment & Plan  Recurrent cellulitis of lower leg 4 day h/o increased swelling redness and pain of left lower extremity which started approx 4 days after completing antibiotic course. Pt has open scaly rah on left leg also and evidence of venous stasis, rocephin in office followed by 10 day course of doxycycline, sooner appt with podiatry, and referral to vascular for evaluation of lower ext circulation  Benign hypertension Controlled, no change in medication   PVD (peripheral vascular disease) ([place]) pVD in diabetic pt with stasis ulcer of lower extremity and recurrent left lower ext cellulitis in past 1 month

## 2016-03-03 NOTE — Assessment & Plan Note (Signed)
4 day h/o increased swelling redness and pain of left lower extremity which started approx 4 days after completing antibiotic course. Pt has open scaly rah on left leg also and evidence of venous stasis, rocephin in office followed by 10 day course of doxycycline, sooner appt with podiatry, and referral to vascular for evaluation of lower ext circulation

## 2016-03-03 NOTE — Telephone Encounter (Signed)
Work in today but will need to be followed up by podiatry as a recuurrent foot problem

## 2016-03-03 NOTE — Telephone Encounter (Signed)
Pt coming in today

## 2016-03-03 NOTE — Patient Instructions (Signed)
F/u as before  Rocephin in office today followed by a course of antibiotic , cleocin  You are referred for evaluation of circulation to feet ([facility], [place]) And also for a sooner appointment with you foot specialist.  Dermatology/ wound specialist may also need to get involved in your care  All the best  Thank you  for choosing [facility]. We consider it a privelige to serve you.  Delivering excellent health care in a caring and  compassionate way is our goal.  Partnering with you,  so that together we can achieve this goal is our strategy.

## 2016-03-03 NOTE — Assessment & Plan Note (Signed)
Controlled, no change in medication  

## 2016-03-03 NOTE — Telephone Encounter (Signed)
Nicholas [name] is calling asking if he can be seen asap, I explained Dr. Moshe Cipro is on a limited schedule this week and someone would have to call him back, he is stating that his left foot is swollen and hurting again, he stated that not long after he finished his antibiotics that his foot swelled and started hurting again, please advise?

## 2016-03-03 NOTE — Assessment & Plan Note (Signed)
pVD in diabetic pt with stasis ulcer of lower extremity and recurrent left lower ext cellulitis in past 1 month

## 2016-03-04 ENCOUNTER — Other Ambulatory Visit: Payer: Self-pay | Admitting: Family Medicine

## 2016-03-04 ENCOUNTER — Other Ambulatory Visit: Payer: Self-pay | Admitting: Gastroenterology

## 2016-03-05 ENCOUNTER — Telehealth: Payer: Self-pay | Admitting: Family Medicine

## 2016-03-05 ENCOUNTER — Encounter: Payer: Self-pay | Admitting: Family Medicine

## 2016-03-05 NOTE — Telephone Encounter (Signed)
His neroupathy is bothering him and wants strong, very strong pain pills called in for him.Marland KitchenMarland Kitchen

## 2016-03-10 ENCOUNTER — Other Ambulatory Visit: Payer: Self-pay

## 2016-03-10 ENCOUNTER — Ambulatory Visit (INDEPENDENT_AMBULATORY_CARE_PROVIDER_SITE_OTHER): Payer: PPO

## 2016-03-10 ENCOUNTER — Ambulatory Visit (INDEPENDENT_AMBULATORY_CARE_PROVIDER_SITE_OTHER): Payer: PPO | Admitting: Sports Medicine

## 2016-03-10 ENCOUNTER — Encounter: Payer: Self-pay | Admitting: Sports Medicine

## 2016-03-10 DIAGNOSIS — E11610 Type 2 diabetes mellitus with diabetic neuropathic arthropathy: Secondary | ICD-10-CM

## 2016-03-10 DIAGNOSIS — M79672 Pain in left foot: Secondary | ICD-10-CM

## 2016-03-10 DIAGNOSIS — I739 Peripheral vascular disease, unspecified: Secondary | ICD-10-CM

## 2016-03-10 MED ORDER — HYDROCODONE-ACETAMINOPHEN 7.5-325 MG PO TABS: 1.0000 | ORAL_TABLET | Freq: Four times a day (QID) | ORAL | 0 refills | Status: DC | PRN

## 2016-03-10 NOTE — Patient Instructions (Signed)
  Charcot Foot   Charcot foot is a deformity that results from nerve damage (neuropathy) in the foot or ankle. The nerve damage causes a loss of sensation that increases the risk of injury to the feet. When the foot is repeatedly injured, the weight-bearing joints start breaking down. Early signs of Charcot foot include redness, swelling, and increased temperature of the foot.   More info on [employer]

## 2016-03-10 NOTE — Progress Notes (Signed)
Patient ID: CHOYA TORNOW, male   DOB: Feb 18, 1956, 60 y.o.   MRN: [id number]  Subjective: Nicholas [name] is a 60 y.o. male patient seen in office for pain in left foot; reports for the last month has been seeing PCP who gave shot and antibiotics with no improvement. Reports redness, swelling, and pain with pressure that radiates to the toes. Patient has been using Cane to help him walk. Denies any other symptoms.    Patient Active Problem List   Diagnosis Date Noted  . Recurrent cellulitis of lower leg 03/03/2016  . Cellulitis of left foot 02/14/2016  . Pain of left heel 01/28/2016  . Benign essential tremor 01/28/2016  . Skin lesion of back 10/07/2015  . Back pain with left-sided radiculopathy 06/05/2015  . Hyperlipidemia 04/10/2015  . Benign hypertension 04/10/2015  . Abdominal wall bulge 01/15/2015  . Wrist pain, right 10/15/2014  . Atypical chest pain 07/22/2014  . Hx of adenomatous colonic polyps 02/21/2014  . Candidiasis 11/28/2013  . Allergic rhinitis 07/24/2013  . Onychomycosis 05/21/2013  . Type 2 diabetes mellitus with pressure callus ([place]) 05/21/2013  . Back pain with radiation 04/20/2011  . Seborrheic dermatitis 11/20/2010  . Dermatomycosis 11/20/2010  . FATIGUE 04/01/2010  . CARPAL TUNNEL SYNDROME, BILATERAL 03/24/2010  . Hypogonadism in male 01/27/2010  . DEPRESSION/ANXIETY 01/27/2010  . CHF 01/13/2010  . DYSPNEA 01/13/2010  . Diabetes mellitus type 2 in obese ([place]) 12/12/2009  . MORBID OBESITY 12/12/2009  . Benign hypertension 12/12/2009  . PVD (peripheral vascular disease) ([place]) 12/12/2009   Current Outpatient Prescriptions on File Prior to Visit  Medication Sig Dispense Refill  . aspirin (ASPIRIN LOW DOSE) 81 MG EC tablet Take 81 mg by mouth daily. Take one tablet by mouth once a day     . brimonidine (ALPHAGAN P) 0.1 % SOLN Place 1 drop into both eyes 2 (two) times daily. One drop in each eye two times a day     . brimonidine-timolol (COMBIGAN) 0.2-0.5  % ophthalmic solution Place 1 drop into both eyes every 12 (twelve) hours. One drop in each eye two times a day      . citalopram (CELEXA) 40 MG tablet TAKE 1 TABLET BY MOUTH ONCE A DAY. 30 tablet 0  . clindamycin (CLEOCIN) 300 MG capsule Take 1 capsule (300 mg total) by mouth 3 (three) times daily. 30 capsule 0  . clotrimazole-betamethasone (LOTRISONE) cream APPLY TO AFFECTED AREAS TWICE DAILY. 45 g 0  . doxycycline (VIBRA-TABS) 100 MG tablet Take 1 tablet (100 mg total) by mouth 2 (two) times daily. (Patient not taking: Reported on 03/03/2016) 20 tablet 0  . furosemide (LASIX) 20 MG tablet Take 1 tablet (20 mg total) by mouth daily. (Patient not taking: Reported on 03/03/2016) 30 tablet 1  . gabapentin (NEURONTIN) 600 MG tablet Take 1 tablet (600 mg total) by mouth 3 (three) times daily. 90 tablet 2  . HUMULIN R U-500 KWIKPEN 500 UNIT/ML kwikpen INJECT UP TO 100 UNITS SUBCUTANEOUSLY 30 MINUTES BEFORE MEALS. 18 mL 2  . lamoTRIgine (LAMICTAL) 200 MG tablet TAKE 1 TABLET BY MOUTH ONCE A DAY. 30 tablet 0  . lisinopril (PRINIVIL,ZESTRIL) 5 MG tablet TAKE 1 TABLET BY MOUTH ONCE A DAY. 30 tablet 2  . metoprolol succinate (TOPROL-XL) 25 MG 24 hr tablet Take 1 tablet (25 mg total) by mouth daily. 30 tablet 4  . naproxen (NAPROSYN) 500 MG tablet Take 1 tablet (500 mg total) by mouth 2 (two) times daily with a meal. 60  tablet 3  . pantoprazole (PROTONIX) 40 MG tablet TAKE (1) TABLET BY MOUTH TWICE DAILY. 60 tablet 3  . simvastatin (ZOCOR) 40 MG tablet TAKE 1 TABLET BY MOUTH ONCE A DAY. 30 tablet 3  . Testosterone (ANDROGEL PUMP TD) Place 75 g onto the skin every morning.    . traMADol (ULTRAM) 50 MG tablet Take 100 mg by mouth every 6 (six) hours as needed.    . TRAVATAN Z 0.004 % SOLN ophthalmic solution Place 1 drop into both eyes at bedtime.     . triamcinolone ointment (KENALOG) 0.1 % Apply 1 application topically 2 (two) times daily. 454 g 0  . VICTOZA 18 MG/3ML SOPN INJECT 1.8 MG SUBCUTANEOUSLY ONCE  DAILY. 9 mL 2  . Vitamin D, Ergocalciferol, (DRISDOL) 50000 units CAPS capsule TAKE ONE CAPSULE BY MOUTH ONCE WEEKLY. (Patient not taking: Reported on 03/03/2016) 12 capsule 0   No current facility-administered medications on file prior to visit.    No Known Allergies  Recent Results (from the past 2160 hour(s))  CBC with Differential     Status: Abnormal   Collection Time: 12/31/15 10:22 AM  Result Value Ref Range   WBC 10.5 4.0 - 10.5 K/uL   RBC 5.13 4.22 - 5.81 MIL/uL   Hemoglobin 14.9 13.0 - 17.0 g/dL   HCT 47.1 39.0 - 52.0 %   MCV 91.8 78.0 - 100.0 fL   MCH 29.0 26.0 - 34.0 pg   MCHC 31.6 30.0 - 36.0 g/dL   RDW 15.3 11.5 - 15.5 %   Platelets 189 150 - 400 K/uL   Neutrophils Relative % 75 %   Neutro Abs 7.8 (H) 1.7 - 7.7 K/uL   Lymphocytes Relative 15 %   Lymphs Abs 1.6 0.7 - 4.0 K/uL   Monocytes Relative 8 %   Monocytes Absolute 0.8 0.1 - 1.0 K/uL   Eosinophils Relative 2 %   Eosinophils Absolute 0.3 0.0 - 0.7 K/uL   Basophils Relative 0 %   Basophils Absolute 0.0 0.0 - 0.1 K/uL  Basic metabolic panel     Status: Abnormal   Collection Time: 12/31/15 10:22 AM  Result Value Ref Range   Sodium 137 135 - 145 mmol/L   Potassium 4.5 3.5 - 5.1 mmol/L   Chloride 107 101 - 111 mmol/L   CO2 24 22 - 32 mmol/L   Glucose, Bld 206 (H) 65 - 99 mg/dL   BUN 17 6 - 20 mg/dL   Creatinine, Ser 1.11 0.61 - 1.24 mg/dL   Calcium 8.7 (L) 8.9 - 10.3 mg/dL   GFR calc non Af Amer >60 >60 mL/min   GFR calc Af Amer >60 >60 mL/min    Comment: (NOTE) The eGFR has been calculated using the CKD EPI equation. This calculation has not been validated in all clinical situations. eGFR's persistently <60 mL/min signify possible Chronic Kidney Disease.    Anion gap 6 5 - 15  COMPLETE METABOLIC PANEL WITH GFR     Status: Abnormal   Collection Time: 01/03/16  9:52 AM  Result Value Ref Range   Sodium 139 135 - 146 mmol/L   Potassium 5.4 (H) 3.5 - 5.3 mmol/L    Comment: No visible hemolysis.    Chloride 104 98 - 110 mmol/L   CO2 25 20 - 31 mmol/L   Glucose, Bld 274 (H) 65 - 99 mg/dL   BUN 22 7 - 25 mg/dL   Creat 1.09 0.70 - 1.25 mg/dL    Comment:   For  patients > or = 61 years of age: The upper reference limit for Creatinine is approximately 13% higher for people identified as African-American.      Total Bilirubin 0.4 0.2 - 1.2 mg/dL   Alkaline Phosphatase 57 40 - 115 U/L   AST 15 10 - 35 U/L   ALT 18 9 - 46 U/L   Total Protein 6.0 (L) 6.1 - 8.1 g/dL   Albumin 3.7 3.6 - 5.1 g/dL   Calcium 8.8 8.6 - 10.3 mg/dL   GFR, Est African American 84 >=60 mL/min   GFR, Est Non African American 73 >=60 mL/min  Hemoglobin A1c     Status: Abnormal   Collection Time: 01/03/16  9:52 AM  Result Value Ref Range   Hgb A1c MFr Bld 6.5 (H) <5.7 %    Comment:   For someone without known diabetes, a hemoglobin A1c value of 6.5% or greater indicates that they may have diabetes and this should be confirmed with a follow-up test.   For someone with known diabetes, a value <7% indicates that their diabetes is well controlled and a value greater than or equal to 7% indicates suboptimal control. A1c targets should be individualized based on duration of diabetes, age, comorbid conditions, and other considerations.   Currently, no consensus exists for use of hemoglobin A1c for diagnosis of diabetes for children.      Mean Plasma Glucose 140 mg/dL    Objective: There were no vitals filed for this visit.  General: Patient is awake, alert, oriented x 3 and in no acute distress.  Dermatology: Skin is warm and dry bilateral with a continued healed ulceration plantar right hallux with reactive keratosis. No acute signs of infection. Nails are short and thickened x 10 with no signs of ingrowing or infection.    Vascular: Dorsalis Pedis pulse = 0/4 Bilateral,  Posterior Tibial pulse = 1/4 Bilateral faint,  Capillary Fill Time < 5 seconds, + chronic venous skin changes bilateral    Neurologic:Protective sensation diminished using the 5.07/10g Semmes Weinstein Monofilament. Vibratory diminished bilateral.   Musculosketal:  Mild pain to dorsal midfoot on left with accompany swelling and redness suggestive of Charcot. No Pain with palpation to previous ulcerated area at right hallux. + Hallux interphalangeus R>L hallux, No pain with compression to calves bilateral.   Xray, Left foot- Cuneiform dislocation with midfoot collapse suggestive Charcot involving lisfranc with lateral dislocation , mild calcaneal heel spur, mild soft tissue swelling. No other acute findings    Assessment and Plan:  Problem List Items Addressed This Visit    None    Visit Diagnoses    Left foot pain    -  Primary   Relevant Orders   DG Foot 2 Views Left   Charcot foot due to diabetes mellitus ([place])       Relevant Medications   HYDROcodone-acetaminophen (NORCO) 7.5-325 MG tablet     -Examined patient  -Right Hallux ulceration healed -Continue with toe cushion right hallux as tolerated -Advised patient to closely monitor for signs of infection or re-occurrence of ulceration  -Xrays left foot reviewed -Dispensed CAM boot for suspected Left midfoot charcot -Rx MRI for further evaluation of midfoot collapse in the setting of acute pain -Rx Norco to take as instructed  -Continue with gabapentin  -Patient to return to office after MRI for follow up care and evaluation or sooner if problems arise.  Landis Martins, DPM

## 2016-03-11 ENCOUNTER — Other Ambulatory Visit: Payer: Self-pay

## 2016-03-11 DIAGNOSIS — M79672 Pain in left foot: Secondary | ICD-10-CM

## 2016-03-11 DIAGNOSIS — E1149 Type 2 diabetes mellitus with other diabetic neurological complication: Secondary | ICD-10-CM

## 2016-03-11 DIAGNOSIS — E11610 Type 2 diabetes mellitus with diabetic neuropathic arthropathy: Secondary | ICD-10-CM

## 2016-03-12 NOTE — Telephone Encounter (Signed)
Was seen by podiatry as Dr Moshe Cipro advised him to do after the last visit

## 2016-03-16 ENCOUNTER — Other Ambulatory Visit: Payer: Self-pay

## 2016-03-16 ENCOUNTER — Telehealth: Payer: Self-pay

## 2016-03-16 MED ORDER — HYDROCODONE-ACETAMINOPHEN 10-325 MG PO TABS: 1.0000 | ORAL_TABLET | Freq: Three times a day (TID) | ORAL | 0 refills | Status: DC | PRN

## 2016-03-16 NOTE — Telephone Encounter (Signed)
I gave him Norco 7.5/325mg  last time. We can go up to 10/325mg  #20 to see if that works better. He also has Gabapentin as well. Let him know that this pain medication is to help him short term for pain that's un-relived by other medications. He should be taking all of his other medications as prescribed. Thanks Dr. Cannon Kettle

## 2016-03-16 NOTE — Telephone Encounter (Signed)
Pt called stating that the pain medication that was given to him was not working and wants to know if he can be prescribed something stronger to help with pain

## 2016-03-20 ENCOUNTER — Encounter: Payer: Self-pay | Admitting: Vascular Surgery

## 2016-03-22 ENCOUNTER — Other Ambulatory Visit: Payer: Self-pay | Admitting: Family Medicine

## 2016-03-24 ENCOUNTER — Ambulatory Visit (INDEPENDENT_AMBULATORY_CARE_PROVIDER_SITE_OTHER): Payer: PPO | Admitting: Sports Medicine

## 2016-03-24 ENCOUNTER — Telehealth: Payer: Self-pay | Admitting: *Deleted

## 2016-03-24 ENCOUNTER — Encounter: Payer: Self-pay | Admitting: Sports Medicine

## 2016-03-24 DIAGNOSIS — I739 Peripheral vascular disease, unspecified: Secondary | ICD-10-CM

## 2016-03-24 DIAGNOSIS — M79672 Pain in left foot: Secondary | ICD-10-CM | POA: Diagnosis not present

## 2016-03-24 DIAGNOSIS — E11610 Type 2 diabetes mellitus with diabetic neuropathic arthropathy: Secondary | ICD-10-CM

## 2016-03-24 DIAGNOSIS — R52 Pain, unspecified: Secondary | ICD-10-CM

## 2016-03-24 MED ORDER — HYDROMORPHONE HCL 4 MG PO TABS: 4.0000 mg | ORAL_TABLET | ORAL | 0 refills | Status: DC | PRN

## 2016-03-24 NOTE — Progress Notes (Signed)
Patient ID: Nicholas [name], male   DOB: 1955/10/20, 60 y.o.   MRN: [id number]  Subjective: Nicholas [name] is a 60 y.o. male patient seen in office for pain in left foot; reports no relief with norco and states that he's been trying to wear boot. Patient has been using Cane to help him walk. Denies any other symptoms.    Patient Active Problem List   Diagnosis Date Noted  . Recurrent cellulitis of lower leg 03/03/2016  . Cellulitis of left foot 02/14/2016  . Pain of left heel 01/28/2016  . Benign essential tremor 01/28/2016  . Skin lesion of back 10/07/2015  . Back pain with left-sided radiculopathy 06/05/2015  . Hyperlipidemia 04/10/2015  . Benign hypertension 04/10/2015  . Abdominal wall bulge 01/15/2015  . Wrist pain, right 10/15/2014  . Atypical chest pain 07/22/2014  . Hx of adenomatous colonic polyps 02/21/2014  . Candidiasis 11/28/2013  . Allergic rhinitis 07/24/2013  . Onychomycosis 05/21/2013  . 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 Back pain with radiation 04/20/2011  . Seborrheic dermatitis 11/20/2010  . Dermatomycosis 11/20/2010  . FATIGUE 04/01/2010  . CARPAL TUNNEL SYNDROME, BILATERAL 03/24/2010  . Hypogonadism in male 01/27/2010  . DEPRESSION/ANXIETY 01/27/2010  . CHF 01/13/2010  . DYSPNEA 01/13/2010  . Diabetes mellitus type 2 in obese ([place]) 12/12/2009  . MORBID OBESITY 12/12/2009  . Benign hypertension 12/12/2009  . PVD (peripheral vascular disease) ([place]) 12/12/2009   Current Outpatient Prescriptions on File Prior to Visit  Medication Sig Dispense Refill  . aspirin (ASPIRIN LOW DOSE) 81 MG EC tablet Take 81 mg by mouth daily. Take one tablet by mouth once a day     . brimonidine (ALPHAGAN P) 0.1 % SOLN Place 1 drop into both eyes 2 (two) times daily. One drop in each eye two times a day     . brimonidine-timolol (COMBIGAN) 0.2-0.5 % ophthalmic solution Place 1 drop into both eyes every 12 (twelve) hours. One drop in each eye  two times a day      . citalopram (CELEXA) 40 MG tablet TAKE 1 TABLET BY MOUTH ONCE A DAY. 30 tablet 0  . clindamycin (CLEOCIN) 300 MG capsule Take 1 capsule (300 mg total) by mouth 3 (three) times daily. 30 capsule 0  . clotrimazole-betamethasone (LOTRISONE) cream APPLY TO AFFECTED AREAS TWICE DAILY. 45 g 0  . doxycycline (VIBRA-TABS) 100 MG tablet Take 1 tablet (100 mg total) by mouth 2 (two) times daily. (Patient not taking: Reported on 03/03/2016) 20 tablet 0  . furosemide (LASIX) 20 MG tablet Take 1 tablet (20 mg total) by mouth daily. (Patient not taking: Reported on 03/03/2016) 30 tablet 1  . gabapentin (NEURONTIN) 600 MG tablet Take 1 tablet (600 mg total) by mouth 3 (three) times daily. 90 tablet 2  . HUMULIN R U-500 KWIKPEN 500 UNIT/ML kwikpen INJECT UP TO 100 UNITS SUBCUTANEOUSLY 30 MINUTES BEFORE MEALS. 18 mL 2  . HYDROcodone-acetaminophen (NORCO) 10-325 MG tablet Take 1 tablet by mouth every 8 (eight) hours as needed. 20 tablet 0  . HYDROcodone-acetaminophen (NORCO) 7.5-325 MG tablet Take 1 tablet by mouth every 6 (six) hours as needed for moderate pain. 60 tablet 0  . lamoTRIgine (LAMICTAL) 200 MG tablet TAKE 1 TABLET BY MOUTH ONCE A DAY. 30 tablet 3  . lisinopril (PRINIVIL,ZESTRIL) 5 MG tablet TAKE 1 TABLET BY MOUTH ONCE A DAY. 30 tablet 2  . metoprolol succinate (TOPROL-XL) 25 MG 24 hr tablet Take 1 tablet (25 mg  total) by mouth daily. 30 tablet 4  . naproxen (NAPROSYN) 500 MG tablet Take 1 tablet (500 mg total) by mouth 2 (two) times daily with a meal. 60 tablet 3  . pantoprazole (PROTONIX) 40 MG tablet TAKE (1) TABLET BY MOUTH TWICE DAILY. 60 tablet 3  . simvastatin (ZOCOR) 40 MG tablet TAKE 1 TABLET BY MOUTH ONCE A DAY. 30 tablet 3  . Testosterone (ANDROGEL PUMP TD) Place 75 g onto the skin every morning.    . traMADol (ULTRAM) 50 MG tablet Take 100 mg by mouth every 6 (six) hours as needed.    . TRAVATAN Z 0.004 % SOLN ophthalmic solution Place 1 drop into both eyes at bedtime.      . triamcinolone ointment (KENALOG) 0.1 % Apply 1 application topically 2 (two) times daily. 454 g 0  . VICTOZA 18 MG/3ML SOPN INJECT 1.8 MG SUBCUTANEOUSLY ONCE DAILY. 9 mL 2  . Vitamin D, Ergocalciferol, (DRISDOL) 50000 units CAPS capsule TAKE ONE CAPSULE BY MOUTH ONCE WEEKLY. (Patient not taking: Reported on 03/03/2016) 12 capsule 0   No current facility-administered medications on file prior to visit.    No Known Allergies  Recent Results (from the past 2160 hour(s))  CBC with Differential     Status: Abnormal   Collection Time: 12/31/15 10:22 AM  Result Value Ref Range   WBC 10.5 4.0 - 10.5 K/uL   RBC 5.13 4.22 - 5.81 MIL/uL   Hemoglobin 14.9 13.0 - 17.0 g/dL   HCT 47.1 39.0 - 52.0 %   MCV 91.8 78.0 - 100.0 fL   MCH 29.0 26.0 - 34.0 pg   MCHC 31.6 30.0 - 36.0 g/dL   RDW 15.3 11.5 - 15.5 %   Platelets 189 150 - 400 K/uL   Neutrophils Relative % 75 %   Neutro Abs 7.8 (H) 1.7 - 7.7 K/uL   Lymphocytes Relative 15 %   Lymphs Abs 1.6 0.7 - 4.0 K/uL   Monocytes Relative 8 %   Monocytes Absolute 0.8 0.1 - 1.0 K/uL   Eosinophils Relative 2 %   Eosinophils Absolute 0.3 0.0 - 0.7 K/uL   Basophils Relative 0 %   Basophils Absolute 0.0 0.0 - 0.1 K/uL  Basic metabolic panel     Status: Abnormal   Collection Time: 12/31/15 10:22 AM  Result Value Ref Range   Sodium 137 135 - 145 mmol/L   Potassium 4.5 3.5 - 5.1 mmol/L   Chloride 107 101 - 111 mmol/L   CO2 24 22 - 32 mmol/L   Glucose, Bld 206 (H) 65 - 99 mg/dL   BUN 17 6 - 20 mg/dL   Creatinine, Ser 1.11 0.61 - 1.24 mg/dL   Calcium 8.7 (L) 8.9 - 10.3 mg/dL   GFR calc non Af Amer >60 >60 mL/min   GFR calc Af Amer >60 >60 mL/min    Comment: (NOTE) The eGFR has been calculated using the CKD EPI equation. This calculation has not been validated in all clinical situations. eGFR's persistently <60 mL/min signify possible Chronic Kidney Disease.    Anion gap 6 5 - 15  COMPLETE METABOLIC PANEL WITH GFR     Status: Abnormal   Collection  Time: 01/03/16  9:52 AM  Result Value Ref Range   Sodium 139 135 - 146 mmol/L   Potassium 5.4 (H) 3.5 - 5.3 mmol/L    Comment: No visible hemolysis.   Chloride 104 98 - 110 mmol/L   CO2 25 20 - 31 mmol/L   Glucose,  Bld 274 (H) 65 - 99 mg/dL   BUN 22 7 - 25 mg/dL   Creat 1.09 0.70 - 1.25 mg/dL    Comment:   For patients > or = 61 years of age: The upper reference limit for Creatinine is approximately 13% higher for people identified as African-American.      Total Bilirubin 0.4 0.2 - 1.2 mg/dL   Alkaline Phosphatase 57 40 - 115 U/L   AST 15 10 - 35 U/L   ALT 18 9 - 46 U/L   Total Protein 6.0 (L) 6.1 - 8.1 g/dL   Albumin 3.7 3.6 - 5.1 g/dL   Calcium 8.8 8.6 - 10.3 mg/dL   GFR, Est African American 84 >=60 mL/min   GFR, Est Non African American 73 >=60 mL/min  Hemoglobin A1c     Status: Abnormal   Collection Time: 01/03/16  9:52 AM  Result Value Ref Range   Hgb A1c MFr Bld 6.5 (H) <5.7 %    Comment:   For someone without known diabetes, a hemoglobin A1c value of 6.5% or greater indicates that they may have diabetes and this should be confirmed with a follow-up test.   For someone with known diabetes, a value <7% indicates that their diabetes is well controlled and a value greater than or equal to 7% indicates suboptimal control. A1c targets should be individualized based on duration of diabetes, age, comorbid conditions, and other considerations.   Currently, no consensus exists for use of hemoglobin A1c for diagnosis of diabetes for children.      Mean Plasma Glucose 140 mg/dL    Objective: There were no vitals filed for this visit.  General: Patient is awake, alert, oriented x 3 and in no acute distress.  Dermatology: Skin is warm and dry bilateral with a continued healed ulceration plantar right hallux with reactive keratosis. No acute signs of infection. Nails are short and thickened x 10 with no signs of ingrowing or infection.    Vascular: Dorsalis Pedis pulse  = 0/4 Bilateral,  Posterior Tibial pulse = 1/4 Bilateral faint,  Capillary Fill Time < 5 seconds, + chronic venous skin changes bilateral   Neurologic:Protective sensation diminished using the 5.07/10g Semmes Weinstein Monofilament. Vibratory diminished bilateral.   Musculosketal:  Mild pain to dorsal midfoot on left with accompany swelling and redness suggestive of Charcot. No Pain with palpation to previous ulcerated area at right hallux. + Hallux interphalangeus R>L hallux, No pain with compression to calves bilateral.   Assessment and Plan:  Problem List Items Addressed This Visit      Cardiovascular and Mediastinum   PVD (peripheral vascular disease) ([place])    Other Visit Diagnoses    Left foot pain    -  Primary   Relevant Medications   HYDROmorphone (DILAUDID) 4 MG tablet   Charcot foot due to diabetes mellitus (H[facility])       Relevant Medications   HYDROmorphone (DILAUDID) 4 MG tablet     -Examined patient  -Right Hallux ulceration healed -Continue with toe cushion right hallux as tolerated -Advised patient to closely monitor for signs of infection or re-occurrence of ulceration  -Applied unna boot to left lower extremity to keep intact 5 days as instructed -Continue with CAM boot for suspected Left midfoot charcot -Awating MRI for further evaluation of midfoot collapse in the setting of acute pain -Changed Norco to Dilaudid   -Continue with gabapentin  -Patient to return to office after MRI for follow up care and evaluation or sooner  if problems arise.  Landis Martins, DPM

## 2016-03-24 NOTE — Telephone Encounter (Addendum)
-----   Message from Nicholas [name], [place] sent at 03/24/2016 11:48 AM EDT ----- Regarding: F/u on MRI MRI was ordered can we follow up on this request and let the patient know where to go for his MRI Thanks Dr. Cannon Kettle. Pt was scheduled for MRI at [facility].  Pt called states he is unable to have MRI due to claustrophobia, and would like to know the alternative. 03/25/2016-Informed pt Dr. Cannon Kettle changed orders to CT of left foot. Pt states he is grateful. Cancelled MRI of left foot, faxed orders for CT of Left foot without contrast to [facility]

## 2016-03-24 NOTE — Patient Instructions (Signed)
STRAPPING INSTRUCTIONS  Strapping's need to be worn for 5 days for maximum benefit.  If you next appointment is before 5 days, please remove prior to coming in.  Try to avoid putting lotion on feet during the taping process.  The tape will not stick as well and will not be as effective.  If you were given stretching exercises, start these after removal of the tape.  These exercises will actually loosen the tape and you may not receive full benefit of the strapping.  It is important that you wear sturdy shoes at all times when walking.  Bedroom shoes, slippers, flip flops, etc. are not acceptable.  **If at any time while wearing the strapping you should notice any irritation such as a rash, redness, or itching, remove the tape and wash your foot/feet thoroughly.  BATHING INSTRUCTIONS  Take a washcloth, fold it a few times and tape it around your ankle.   Then take a garbage bag, place it over your foot and angle and tape it above and below the washcloth.  TAKE A QUICK SHOWER.  The washcloth should absorb any water that runs down into the garbage bag.  If your foot gets wet, take a towel and absorb as much of the water as possible. You can also take a blow dryer, put it on low heat, and dry your bandage.  

## 2016-03-24 NOTE — Telephone Encounter (Signed)
Open MRI or CT

## 2016-03-25 ENCOUNTER — Other Ambulatory Visit: Payer: Self-pay | Admitting: "Endocrinology

## 2016-03-27 ENCOUNTER — Encounter: Payer: Self-pay | Admitting: Vascular Surgery

## 2016-03-27 ENCOUNTER — Ambulatory Visit (HOSPITAL_COMMUNITY)
Admission: RE | Admit: 2016-03-27 | Discharge: 2016-03-27 | Disposition: A | Discharge status: Home / Self Care | Payer: PPO | Source: Ambulatory Visit | Attending: Vascular Surgery | Admitting: Vascular Surgery

## 2016-03-27 ENCOUNTER — Ambulatory Visit (INDEPENDENT_AMBULATORY_CARE_PROVIDER_SITE_OTHER): Payer: PPO | Admitting: Vascular Surgery

## 2016-03-27 DIAGNOSIS — I872 Venous insufficiency (chronic) (peripheral): Secondary | ICD-10-CM

## 2016-03-27 DIAGNOSIS — I739 Peripheral vascular disease, unspecified: Secondary | ICD-10-CM | POA: Diagnosis not present

## 2016-03-27 NOTE — Progress Notes (Signed)
Patient ID: Nicholas [name], male   DOB: 03/07/1956, 60 y.o.   MRN: [id number]  Reason for Consult: New Evaluation (eval PVD/recurrent ulceration/skin changes - ref by Dr. Moshe Cipro)   Referred by Fayrene Helper, MD  Subjective:     HPI:  Nicholas [name] is a 60 y.o. male presents for evaluation of his bilateral lower extremity concern for cellulitis. He has taken antibiotics for this and has had wound care as an outpatient. He does not currently wear compression hose nor have any open ulcers. His limitation to walking is his weight is shortness of breath. He has never had a history of any stroke symptoms. He is never been a smoker. He is a diabetic. He does not have a history of DVT.  Past Medical History:  Diagnosis Date  . Chronic knee pain   . Depression   . Diabetes ([place])   . Glaucoma   . Hyperlipidemia   . Hypertension   . MRSA (methicillin resistant Staphylococcus aureus)   . Poor circulation    leg   . Suicide attempt ([place])   . Testosterone deficiency    Family History  Problem Relation Age of Onset  . Diabetes Mother   . Leukemia Mother   . Cancer Father     colon, age greater than 32?  Marland Kitchen Heart disease Father   . Glaucoma Father   . Parkinsonism Father     altzheimers , brain hemorrhage  . Colon cancer Neg Hx    Past Surgical History:  Procedure Laterality Date  . CHOLECYSTECTOMY N/A 02/25/2015   Procedure: LAPAROSCOPIC CHOLECYSTECTOMY;  Surgeon: Aviva Signs, MD;  Location: [facility];  Service: General;  Laterality: N/A;  . COLONOSCOPY  03/2008   [id number] external hemorrhoidal tag, otherwise normal rectum/Two diminutive rectosigmoid polyps s/p bx/ Polyp in the opposite the ileocecal valve s/p bx. tubular adenoma.  . COLONOSCOPY N/A 03/23/2014   [id number] colon polyps throughout his rectum and colon. Sessile and pedunculated. The largest polyp was proximally 6 mm in dimensions in the retum at 3 cm with adjacent diminutive polyp.  There was also a polyps  on ileocecal valve, ascending segmen descending and sigmoid segment. as the colon was tortuous & elongated requiring externa abd pressure and changing of the pts postion to reach cecum  . ESOPHAGOGASTRODUODENOSCOPY N/A 03/23/2014   RMR: 1. Patient had circumferential distal esophageal erosions within 5 mm of the GE junction.  No Barrett's esophagus. Tubular esophagus patent throughout its course. 2.  Diffuse gastric erosions. (1) 31mm area of healing ulceration in the antrum. No ulcer or infiltrating process observed. Patent pylorus. Normal-appearing first and second portion of the duodenum .  . kidney stones     cysto with litholapexy    Short Social History:  Social History  Substance Use Topics  . Smoking status: Former Smoker    Packs/day: 1.50    Years: 25.00    Types: Cigarettes  . Smokeless tobacco: Former Systems developer    Quit date: 02/20/2002     Comment: Quit x 13 years  . Alcohol use No    No Known Allergies  Current Outpatient Prescriptions  Medication Sig Dispense Refill  . aspirin (ASPIRIN LOW DOSE) 81 MG EC tablet Take 81 mg by mouth daily. Take one tablet by mouth once a day     . brimonidine (ALPHAGAN P) 0.1 % SOLN Place 1 drop into both eyes 2 (two) times daily. One drop in each eye two times a day     .  brimonidine-timolol (COMBIGAN) 0.2-0.5 % ophthalmic solution Place 1 drop into both eyes every 12 (twelve) hours. One drop in each eye two times a day      . citalopram (CELEXA) 40 MG tablet TAKE 1 TABLET BY MOUTH ONCE A DAY. 30 tablet 0  . clindamycin (CLEOCIN) 300 MG capsule Take 1 capsule (300 mg total) by mouth 3 (three) times daily. 30 capsule 0  . clotrimazole-betamethasone (LOTRISONE) cream APPLY TO AFFECTED AREAS TWICE DAILY. 45 g 0  . gabapentin (NEURONTIN) 600 MG tablet Take 1 tablet (600 mg total) by mouth 3 (three) times daily. 90 tablet 2  . HUMULIN R U-500 KWIKPEN 500 UNIT/ML kwikpen INJECT UP TO 100 UNITS SUBCUTANEOUSLY 30 MINUTES BEFORE MEALS. 18 mL 2  .  HYDROcodone-acetaminophen (NORCO) 10-325 MG tablet Take 1 tablet by mouth every 8 (eight) hours as needed. 20 tablet 0  . HYDROcodone-acetaminophen (NORCO) 7.5-325 MG tablet Take 1 tablet by mouth every 6 (six) hours as needed for moderate pain. 60 tablet 0  . HYDROmorphone (DILAUDID) 4 MG tablet Take 1 tablet (4 mg total) by mouth every 4 (four) hours as needed for severe pain. 60 tablet 0  . lamoTRIgine (LAMICTAL) 200 MG tablet TAKE 1 TABLET BY MOUTH ONCE A DAY. 30 tablet 3  . lisinopril (PRINIVIL,ZESTRIL) 5 MG tablet TAKE 1 TABLET BY MOUTH ONCE A DAY. 30 tablet 2  . metoprolol succinate (TOPROL-XL) 25 MG 24 hr tablet Take 1 tablet (25 mg total) by mouth daily. 30 tablet 4  . naproxen (NAPROSYN) 500 MG tablet Take 1 tablet (500 mg total) by mouth 2 (two) times daily with a meal. 60 tablet 3  . pantoprazole (PROTONIX) 40 MG tablet TAKE (1) TABLET BY MOUTH TWICE DAILY. 60 tablet 3  . simvastatin (ZOCOR) 40 MG tablet TAKE 1 TABLET BY MOUTH ONCE A DAY. 30 tablet 3  . Testosterone (ANDROGEL PUMP TD) Place 75 g onto the skin every morning.    . traMADol (ULTRAM) 50 MG tablet Take 100 mg by mouth every 6 (six) hours as needed.    . TRAVATAN Z 0.004 % SOLN ophthalmic solution Place 1 drop into both eyes at bedtime.     . triamcinolone ointment (KENALOG) 0.1 % Apply 1 application topically 2 (two) times daily. 454 g 0  . VICTOZA 18 MG/3ML SOPN INJECT 1.8 MG SUBCUTANEOUSLY ONCE DAILY. 9 mL 2  . doxycycline (VIBRA-TABS) 100 MG tablet Take 1 tablet (100 mg total) by mouth 2 (two) times daily. (Patient not taking: Reported on 03/27/2016) 20 tablet 0  . furosemide (LASIX) 20 MG tablet Take 1 tablet (20 mg total) by mouth daily. (Patient not taking: Reported on 03/27/2016) 30 tablet 1  . Vitamin D, Ergocalciferol, (DRISDOL) 50000 units CAPS capsule TAKE ONE CAPSULE BY MOUTH ONCE WEEKLY. (Patient not taking: Reported on 03/27/2016) 12 capsule 0   No current facility-administered medications for this visit.      Review of Systems  Constitutional: Positive for fatigue. Negative for fever.  Eyes: Eyes negative.  Respiratory: Positive for shortness of breath.  Cardiovascular: Positive for leg swelling.  GI: Gastrointestinal negative.  Musculoskeletal: Positive for leg pain.  Skin:       Skin changes in bilateral legs Neurological: Neurological negative. Hematologic: Hematologic/lymphatic negative.  Psychiatric: Psychiatric negative.        Objective:  Objective   Vitals:   03/27/16 1506  BP: (P) 132/72  Pulse: (P) 72  Resp: (P) 20  SpO2: (P) 92%  Weight: (!) (P) 438 lb (  198.7 kg)  Height: (P) 6' (1.829 m)   Body mass index is 59.4 kg/m (pended).  Physical Exam  Constitutional: He is oriented to person, place, and time. He appears well-developed.  obese  HENT:  Head: Normocephalic.  Eyes: EOM are normal.  Neck: Neck supple.  Cardiovascular: Normal rate.   Pulses:      Carotid pulses are 2+ on the right side, and 2+ on the left side.      Radial pulses are 2+ on the right side, and 2+ on the left side.       Popliteal pulses are 2+ on the right side, and 2+ on the left side.       Dorsalis pedis pulses are 2+ on the right side, and 2+ on the left side.       Posterior tibial pulses are 2+ on the right side, and 2+ on the left side.  Abdominal: Soft. He exhibits no mass.  Musculoskeletal: Normal range of motion. He exhibits edema.  Neurological: He is alert and oriented to person, place, and time.  Skin:  Bilateral leg areas of heaped lotion/salve Skin tear on R anterior leg with bleeding    Data: Bilateral ABIs greater than 1 with triphasic signals at the DP and PT bilaterally.     Assessment/Plan:    60 year old white male presents with significant obesity and history of recurrent cellulitis of his bilateral lower extremities with concern for arterial disease. He does have ABIs greater than 1 bilaterally and his palpable pulses. This is more concerning given his  skin changes for lower extremity venous disease. Should he have venous disease he could be considered for intervention however his weight is certainly a greater factor as well as likely his underlying cardiac condition. Prescribed him 2030 thigh-high stockings at our office visit today. He does not need antibiotics at this time it does not appear as he is not having fevers and does not have cellulitis. We'll have him follow up in 1 month with the history reflux study and can consider intervention if needed at that time.     Waynetta Sandy MD [facility]

## 2016-03-30 ENCOUNTER — Other Ambulatory Visit: Payer: Self-pay | Admitting: Family Medicine

## 2016-03-31 ENCOUNTER — Telehealth: Payer: Self-pay | Admitting: *Deleted

## 2016-03-31 ENCOUNTER — Other Ambulatory Visit: Payer: Self-pay

## 2016-03-31 DIAGNOSIS — R0602 Shortness of breath: Secondary | ICD-10-CM

## 2016-03-31 DIAGNOSIS — I878 Other specified disorders of veins: Secondary | ICD-10-CM

## 2016-03-31 NOTE — Addendum Note (Signed)
Addended by: Kaleen Mask on: 03/31/2016 02:17 PM   Modules accepted: Orders

## 2016-03-31 NOTE — Telephone Encounter (Signed)
"  Nicholas [name]'s CT needs to be authorized.  It is scheduled for tomorrow.  We need to know by 3 pm.  [name] NPI is [id number]."  I will work on it.

## 2016-04-01 ENCOUNTER — Ambulatory Visit (HOSPITAL_COMMUNITY)
Admission: RE | Admit: 2016-04-01 | Discharge: 2016-04-01 | Disposition: A | Discharge status: Home / Self Care | Payer: PPO | Source: Ambulatory Visit | Attending: Sports Medicine | Admitting: Sports Medicine

## 2016-04-01 DIAGNOSIS — E11610 Type 2 diabetes mellitus with diabetic neuropathic arthropathy: Secondary | ICD-10-CM | POA: Diagnosis not present

## 2016-04-01 DIAGNOSIS — S92022A Displaced fracture of anterior process of left calcaneus, initial encounter for closed fracture: Secondary | ICD-10-CM | POA: Diagnosis not present

## 2016-04-01 DIAGNOSIS — M79672 Pain in left foot: Secondary | ICD-10-CM | POA: Diagnosis not present

## 2016-04-02 NOTE — Telephone Encounter (Signed)
CT was authorized through [facility] on 10.03.2017.  Authorization number is [id number].

## 2016-04-03 DIAGNOSIS — H409 Unspecified glaucoma: Secondary | ICD-10-CM | POA: Diagnosis not present

## 2016-04-03 DIAGNOSIS — L97829 Non-pressure chronic ulcer of other part of left lower leg with unspecified severity: Secondary | ICD-10-CM | POA: Diagnosis not present

## 2016-04-03 DIAGNOSIS — L97819 Non-pressure chronic ulcer of other part of right lower leg with unspecified severity: Secondary | ICD-10-CM | POA: Diagnosis not present

## 2016-04-03 DIAGNOSIS — E119 Type 2 diabetes mellitus without complications: Secondary | ICD-10-CM | POA: Diagnosis not present

## 2016-04-03 DIAGNOSIS — I10 Essential (primary) hypertension: Secondary | ICD-10-CM | POA: Diagnosis not present

## 2016-04-03 DIAGNOSIS — G20 Parkinson's disease: Secondary | ICD-10-CM | POA: Diagnosis not present

## 2016-04-03 DIAGNOSIS — I872 Venous insufficiency (chronic) (peripheral): Secondary | ICD-10-CM | POA: Diagnosis not present

## 2016-04-06 DIAGNOSIS — L97819 Non-pressure chronic ulcer of other part of right lower leg with unspecified severity: Secondary | ICD-10-CM | POA: Diagnosis not present

## 2016-04-06 DIAGNOSIS — L97829 Non-pressure chronic ulcer of other part of left lower leg with unspecified severity: Secondary | ICD-10-CM | POA: Diagnosis not present

## 2016-04-06 DIAGNOSIS — I872 Venous insufficiency (chronic) (peripheral): Secondary | ICD-10-CM | POA: Diagnosis not present

## 2016-04-09 DIAGNOSIS — L97829 Non-pressure chronic ulcer of other part of left lower leg with unspecified severity: Secondary | ICD-10-CM | POA: Diagnosis not present

## 2016-04-09 DIAGNOSIS — H409 Unspecified glaucoma: Secondary | ICD-10-CM | POA: Diagnosis not present

## 2016-04-09 DIAGNOSIS — I10 Essential (primary) hypertension: Secondary | ICD-10-CM | POA: Diagnosis not present

## 2016-04-09 DIAGNOSIS — E119 Type 2 diabetes mellitus without complications: Secondary | ICD-10-CM | POA: Diagnosis not present

## 2016-04-09 DIAGNOSIS — I872 Venous insufficiency (chronic) (peripheral): Secondary | ICD-10-CM | POA: Diagnosis not present

## 2016-04-09 DIAGNOSIS — G20 Parkinson's disease: Secondary | ICD-10-CM | POA: Diagnosis not present

## 2016-04-09 DIAGNOSIS — L97819 Non-pressure chronic ulcer of other part of right lower leg with unspecified severity: Secondary | ICD-10-CM | POA: Diagnosis not present

## 2016-04-10 DIAGNOSIS — L97819 Non-pressure chronic ulcer of other part of right lower leg with unspecified severity: Secondary | ICD-10-CM | POA: Diagnosis not present

## 2016-04-10 DIAGNOSIS — L97829 Non-pressure chronic ulcer of other part of left lower leg with unspecified severity: Secondary | ICD-10-CM | POA: Diagnosis not present

## 2016-04-10 DIAGNOSIS — I872 Venous insufficiency (chronic) (peripheral): Secondary | ICD-10-CM | POA: Diagnosis not present

## 2016-04-13 ENCOUNTER — Ambulatory Visit: Payer: PPO | Admitting: "Endocrinology

## 2016-04-14 ENCOUNTER — Encounter: Payer: Self-pay | Admitting: Nurse Practitioner

## 2016-04-14 ENCOUNTER — Ambulatory Visit (INDEPENDENT_AMBULATORY_CARE_PROVIDER_SITE_OTHER): Payer: PPO | Admitting: Nurse Practitioner

## 2016-04-14 DIAGNOSIS — K219 Gastro-esophageal reflux disease without esophagitis: Secondary | ICD-10-CM | POA: Diagnosis not present

## 2016-04-14 NOTE — Progress Notes (Signed)
cc'ed to pcp °

## 2016-04-14 NOTE — Assessment & Plan Note (Signed)
GERD and abdominal pain symptoms resolved on Protonix. Recommend he continue Protonix, return for follow-up as needed.

## 2016-04-14 NOTE — Patient Instructions (Signed)
1. Continue taking Protonix as you have been. 2. Return for follow-up if needed. 3. Call us if you needed further refills on her medication.

## 2016-04-14 NOTE — Progress Notes (Signed)
Referring Provider: Fayrene Helper, MD Primary Care Physician:  Tula Nakayama, MD Primary GI:  Dr. Gala Romney  Chief Complaint  Patient presents with  . Follow-up    HPI:   Nicholas [name] is a 60 y.o. male who presents for follow-up. He was last seen in our office 04/17/2015 for gastritis and abdominal pain. At that time his GERD symptoms are well-controlled on twice a day PPI. Last endoscopy 2015. Abdominal pain completed to resolve status post laparoscopic cholecystectomy. Recommended continue PPI, follow up in one year or sooner if any recurrent symptoms.  Today he states he's doing well overall. GERD symptoms are well controlled on Protonix. Denies abdominal pain, N/V, hematochezia, melena. Denies fever, chills, acute changes in bowel habits. Is having problems with his left foot related to neuropathy and is actively seeing podiatry for treatment. Denies chest pain, dyspnea, dizziness, lightheadedness, syncope, near syncope. Denies any other upper or lower GI symptoms.  Past Medical History:  Diagnosis Date  . Chronic knee pain   . Depression   . Diabetes ([place])   . Glaucoma   . Hyperlipidemia   . Hypertension   . MRSA (methicillin resistant Staphylococcus aureus)   . Poor circulation    leg   . Suicide attempt   . Testosterone deficiency     Past Surgical History:  Procedure Laterality Date  . CHOLECYSTECTOMY N/A 02/25/2015   Procedure: LAPAROSCOPIC CHOLECYSTECTOMY;  Surgeon: Aviva Signs, MD;  Location: [facility];  Service: General;  Laterality: N/A;  . COLONOSCOPY  03/2008   [id number] external hemorrhoidal tag, otherwise normal rectum/Two diminutive rectosigmoid polyps s/p bx/ Polyp in the opposite the ileocecal valve s/p bx. tubular adenoma.  . COLONOSCOPY N/A 03/23/2014   [id number] colon polyps throughout his rectum and colon. Sessile and pedunculated. The largest polyp was proximally 6 mm in dimensions in the retum at 3 cm with adjacent diminutive polyp.  There  was also a polyps on ileocecal valve, ascending segmen descending and sigmoid segment. as the colon was tortuous & elongated requiring externa abd pressure and changing of the pts postion to reach cecum  . ESOPHAGOGASTRODUODENOSCOPY N/A 03/23/2014   RMR: 1. Patient had circumferential distal esophageal erosions within 5 mm of the GE junction.  No Barrett's esophagus. Tubular esophagus patent throughout its course. 2.  Diffuse gastric erosions. (1) 50mm area of healing ulceration in the antrum. No ulcer or infiltrating process observed. Patent pylorus. Normal-appearing first and second portion of the duodenum .  . kidney stones     cysto with litholapexy    Current Outpatient Prescriptions  Medication Sig Dispense Refill  . aspirin (ASPIRIN LOW DOSE) 81 MG EC tablet Take 81 mg by mouth daily. Take one tablet by mouth once a day     . brimonidine (ALPHAGAN P) 0.1 % SOLN Place 1 drop into both eyes 2 (two) times daily. One drop in each eye two times a day     . brimonidine-timolol (COMBIGAN) 0.2-0.5 % ophthalmic solution Place 1 drop into both eyes every 12 (twelve) hours. One drop in each eye two times a day      . citalopram (CELEXA) 40 MG tablet TAKE 1 TABLET BY MOUTH ONCE A DAY. 30 tablet 0  . clotrimazole-betamethasone (LOTRISONE) cream APPLY TO AFFECTED AREAS TWICE DAILY. 45 g 0  . furosemide (LASIX) 20 MG tablet Take 1 tablet (20 mg total) by mouth daily. 30 tablet 1  . gabapentin (NEURONTIN) 600 MG tablet Take 1 tablet (600 mg total) by  mouth 3 (three) times daily. 90 tablet 2  . HUMULIN R U-500 KWIKPEN 500 UNIT/ML kwikpen INJECT UP TO 100 UNITS SUBCUTANEOUSLY 30 MINUTES BEFORE MEALS. 18 mL 2  . lamoTRIgine (LAMICTAL) 200 MG tablet TAKE 1 TABLET BY MOUTH ONCE A DAY. 30 tablet 3  . lisinopril (PRINIVIL,ZESTRIL) 5 MG tablet TAKE 1 TABLET BY MOUTH ONCE A DAY. 30 tablet 2  . metoprolol succinate (TOPROL-XL) 25 MG 24 hr tablet Take 1 tablet (25 mg total) by mouth daily. 30 tablet 4  . naproxen  (NAPROSYN) 500 MG tablet Take 1 tablet (500 mg total) by mouth 2 (two) times daily with a meal. 60 tablet 3  . pantoprazole (PROTONIX) 40 MG tablet TAKE (1) TABLET BY MOUTH TWICE DAILY. 60 tablet 3  . simvastatin (ZOCOR) 40 MG tablet TAKE 1 TABLET BY MOUTH ONCE A DAY. 30 tablet 3  . Testosterone (ANDROGEL PUMP TD) Place 75 g onto the skin every morning.    . traMADol (ULTRAM) 50 MG tablet TAKE 2 TABLETS BY MOUTH EVERY 8 HOURS AS NEEDED. 180 tablet 0  . TRAVATAN Z 0.004 % SOLN ophthalmic solution Place 1 drop into both eyes at bedtime.     . triamcinolone ointment (KENALOG) 0.1 % Apply 1 application topically 2 (two) times daily. 454 g 0  . VICTOZA 18 MG/3ML SOPN INJECT 1.8 MG SUBCUTANEOUSLY ONCE DAILY. 9 mL 2  . Vitamin D, Ergocalciferol, (DRISDOL) 50000 units CAPS capsule TAKE ONE CAPSULE BY MOUTH ONCE WEEKLY. 12 capsule 0   No current facility-administered medications for this visit.     Allergies as of 04/14/2016  . (No Known Allergies)    Family History  Problem Relation Age of Onset  . Diabetes Mother   . Leukemia Mother   . Cancer Father     colon, age greater than 33?  Marland Kitchen Heart disease Father   . Glaucoma Father   . Parkinsonism Father     altzheimers , brain hemorrhage  . Colon cancer Neg Hx     Social History   Social History  . Marital status: Divorced    Spouse name: N/A  . Number of children: 0  . Years of education: 12   Occupational History  . DISABLED   .  Unemployed   Social History Main Topics  . Smoking status: Former Smoker    Packs/day: 1.50    Years: 25.00    Types: Cigarettes    Quit date: 02/20/2002  . Smokeless tobacco: Former Systems developer     Comment: Quit x 13 years  . Alcohol use No  . Drug use: No  . Sexual activity: Not Currently    Birth control/ protection: None   Other Topics Concern  . None   Social History Narrative  . None    Review of Systems: General: Negative for anorexia, weight loss, fever, chills, fatigue, weakness. ENT:  Negative for hoarseness, difficulty swallowing. CV: Negative for chest pain, angina, palpitations, peripheral edema.  Respiratory: Negative for dyspnea at rest, cough, sputum, wheezing.  GI: See history of present illness. Endo: Negative for unusual weight change.  Heme: Negative for bruising or bleeding.   Physical Exam: BP (!) 125/58   Pulse 83   Temp 97 F (36.1 C) (Oral)   Ht 6' (1.829 m)   Wt (!) 438 lb (198.7 kg)   BMI 59.40 kg/m  General:   Morbidly obese. Alert and oriented. Pleasant and cooperative. Well-nourished and well-developed.  Head:  Normocephalic and atraumatic. Eyes:  Without icterus,  sclera clear and conjunctiva pink.  Ears:  Normal auditory acuity. Cardiovascular:  S1, S2 present without murmurs appreciated. Extremities without clubbing or edema. Respiratory:  Clear to auscultation bilaterally. No wheezes, rales, or rhonchi. No distress.  Gastrointestinal:  +BS, Obese but soft, non-tender and non-distended. No HSM noted. No guarding or rebound. No masses appreciated.  Rectal:  Deferred  Musculoskalatal:  Symmetrical without gross deformities. Neurologic:  Alert and oriented x4;  grossly normal neurologically. Psych:  Alert and cooperative. Normal mood and affect. Heme/Lymph/Immune: No excessive bruising noted.    04/14/2016 10:54 AM   Disclaimer: This note was dictated with voice recognition software. Similar sounding words can inadvertently be transcribed and may not be corrected upon review.

## 2016-04-15 DIAGNOSIS — T1490XA Injury, unspecified, initial encounter: Secondary | ICD-10-CM | POA: Diagnosis not present

## 2016-04-16 DIAGNOSIS — G20 Parkinson's disease: Secondary | ICD-10-CM | POA: Diagnosis not present

## 2016-04-16 DIAGNOSIS — I872 Venous insufficiency (chronic) (peripheral): Secondary | ICD-10-CM | POA: Diagnosis not present

## 2016-04-16 DIAGNOSIS — L97819 Non-pressure chronic ulcer of other part of right lower leg with unspecified severity: Secondary | ICD-10-CM | POA: Diagnosis not present

## 2016-04-16 DIAGNOSIS — H409 Unspecified glaucoma: Secondary | ICD-10-CM | POA: Diagnosis not present

## 2016-04-16 DIAGNOSIS — L97829 Non-pressure chronic ulcer of other part of left lower leg with unspecified severity: Secondary | ICD-10-CM | POA: Diagnosis not present

## 2016-04-16 DIAGNOSIS — E119 Type 2 diabetes mellitus without complications: Secondary | ICD-10-CM | POA: Diagnosis not present

## 2016-04-16 DIAGNOSIS — I10 Essential (primary) hypertension: Secondary | ICD-10-CM | POA: Diagnosis not present

## 2016-04-17 ENCOUNTER — Encounter: Payer: PPO | Admitting: Vascular Surgery

## 2016-04-17 ENCOUNTER — Encounter (HOSPITAL_COMMUNITY): Payer: PPO

## 2016-04-21 ENCOUNTER — Encounter: Payer: Self-pay | Admitting: Vascular Surgery

## 2016-04-21 ENCOUNTER — Encounter: Payer: Self-pay | Admitting: Sports Medicine

## 2016-04-21 ENCOUNTER — Ambulatory Visit (INDEPENDENT_AMBULATORY_CARE_PROVIDER_SITE_OTHER): Payer: PPO | Admitting: Sports Medicine

## 2016-04-21 DIAGNOSIS — L97819 Non-pressure chronic ulcer of other part of right lower leg with unspecified severity: Secondary | ICD-10-CM | POA: Diagnosis not present

## 2016-04-21 DIAGNOSIS — E1165 Type 2 diabetes mellitus with hyperglycemia: Secondary | ICD-10-CM | POA: Diagnosis not present

## 2016-04-21 DIAGNOSIS — I872 Venous insufficiency (chronic) (peripheral): Secondary | ICD-10-CM | POA: Diagnosis not present

## 2016-04-21 DIAGNOSIS — L97829 Non-pressure chronic ulcer of other part of left lower leg with unspecified severity: Secondary | ICD-10-CM | POA: Diagnosis not present

## 2016-04-21 DIAGNOSIS — H409 Unspecified glaucoma: Secondary | ICD-10-CM | POA: Diagnosis not present

## 2016-04-21 DIAGNOSIS — B351 Tinea unguium: Secondary | ICD-10-CM

## 2016-04-21 DIAGNOSIS — L97511 Non-pressure chronic ulcer of other part of right foot limited to breakdown of skin: Secondary | ICD-10-CM

## 2016-04-21 DIAGNOSIS — R52 Pain, unspecified: Secondary | ICD-10-CM

## 2016-04-21 DIAGNOSIS — I739 Peripheral vascular disease, unspecified: Secondary | ICD-10-CM

## 2016-04-21 DIAGNOSIS — E11610 Type 2 diabetes mellitus with diabetic neuropathic arthropathy: Secondary | ICD-10-CM

## 2016-04-21 DIAGNOSIS — L89891 Pressure ulcer of other site, stage 1: Secondary | ICD-10-CM

## 2016-04-21 DIAGNOSIS — I10 Essential (primary) hypertension: Secondary | ICD-10-CM | POA: Diagnosis not present

## 2016-04-21 DIAGNOSIS — E119 Type 2 diabetes mellitus without complications: Secondary | ICD-10-CM | POA: Diagnosis not present

## 2016-04-21 DIAGNOSIS — G20 Parkinson's disease: Secondary | ICD-10-CM | POA: Diagnosis not present

## 2016-04-21 MED ORDER — HYDROMORPHONE HCL 4 MG PO TABS: 4.0000 mg | ORAL_TABLET | ORAL | 0 refills | Status: DC | PRN

## 2016-04-21 NOTE — Progress Notes (Signed)
Patient ID: Nicholas [name], male   DOB: 1955-09-17, 60 y.o.   MRN: [id number]  Subjective: Nicholas [name] is a 60 y.o. Diabetic male patient seen in office for discussion of CT scan and for f/u of pain in left foot; reports relief with Diluadid and has also been getting unna boots bilateral ([facility]) which is helping. Patient has been using post op shoe and walker to help him walk. Desires nails trimmed and callus checked at right big toe. Denies any other symptoms.    Patient Active Problem List   Diagnosis Date Noted  . GERD (gastroesophageal reflux disease) 04/14/2016  . Recurrent cellulitis of lower leg 03/03/2016  . Cellulitis of left foot 02/14/2016  . Pain of left heel 01/28/2016  . Benign essential tremor 01/28/2016  . Skin lesion of back 10/07/2015  . Back pain with left-sided radiculopathy 06/05/2015  . Hyperlipidemia 04/10/2015  . Benign hypertension 04/10/2015  . Abdominal wall bulge 01/15/2015  . Wrist pain, right 10/15/2014  . Atypical chest pain 07/22/2014  . Hx of adenomatous colonic polyps 02/21/2014  . Candidiasis 11/28/2013  . Allergic rhinitis 07/24/2013  . Onychomycosis 05/21/2013  . 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 Back pain with radiation 04/20/2011  . Seborrheic dermatitis 11/20/2010  . Dermatomycosis 11/20/2010  . FATIGUE 04/01/2010  . CARPAL TUNNEL SYNDROME, BILATERAL 03/24/2010  . Hypogonadism in male 01/27/2010  . DEPRESSION/ANXIETY 01/27/2010  . CHF 01/13/2010  . DYSPNEA 01/13/2010  . Diabetes mellitus type 2 in obese ([place]) 12/12/2009  . MORBID OBESITY 12/12/2009  . Benign hypertension 12/12/2009  . PVD (peripheral vascular disease) ([place]) 12/12/2009   Current Outpatient Prescriptions on File Prior to Visit  Medication Sig Dispense Refill  . aspirin (ASPIRIN LOW DOSE) 81 MG EC tablet Take 81 mg by mouth daily. Take one tablet by mouth once a day     . brimonidine (ALPHAGAN P) 0.1 % SOLN Place 1  drop into both eyes 2 (two) times daily. One drop in each eye two times a day     . brimonidine-timolol (COMBIGAN) 0.2-0.5 % ophthalmic solution Place 1 drop into both eyes every 12 (twelve) hours. One drop in each eye two times a day      . citalopram (CELEXA) 40 MG tablet TAKE 1 TABLET BY MOUTH ONCE A DAY. 30 tablet 0  . clotrimazole-betamethasone (LOTRISONE) cream APPLY TO AFFECTED AREAS TWICE DAILY. 45 g 0  . furosemide (LASIX) 20 MG tablet Take 1 tablet (20 mg total) by mouth daily. 30 tablet 1  . gabapentin (NEURONTIN) 600 MG tablet Take 1 tablet (600 mg total) by mouth 3 (three) times daily. 90 tablet 2  . HUMULIN R U-500 KWIKPEN 500 UNIT/ML kwikpen INJECT UP TO 100 UNITS SUBCUTANEOUSLY 30 MINUTES BEFORE MEALS. 18 mL 2  . lamoTRIgine (LAMICTAL) 200 MG tablet TAKE 1 TABLET BY MOUTH ONCE A DAY. 30 tablet 3  . lisinopril (PRINIVIL,ZESTRIL) 5 MG tablet TAKE 1 TABLET BY MOUTH ONCE A DAY. 30 tablet 2  . metoprolol succinate (TOPROL-XL) 25 MG 24 hr tablet Take 1 tablet (25 mg total) by mouth daily. 30 tablet 4  . naproxen (NAPROSYN) 500 MG tablet Take 1 tablet (500 mg total) by mouth 2 (two) times daily with a meal. 60 tablet 3  . pantoprazole (PROTONIX) 40 MG tablet TAKE (1) TABLET BY MOUTH TWICE DAILY. 60 tablet 3  . simvastatin (ZOCOR) 40 MG tablet TAKE 1 TABLET BY MOUTH ONCE A DAY. 30 tablet 3  .  Testosterone (ANDROGEL PUMP TD) Place 75 g onto the skin every morning.    . traMADol (ULTRAM) 50 MG tablet TAKE 2 TABLETS BY MOUTH EVERY 8 HOURS AS NEEDED. 180 tablet 0  . TRAVATAN Z 0.004 % SOLN ophthalmic solution Place 1 drop into both eyes at bedtime.     . triamcinolone ointment (KENALOG) 0.1 % Apply 1 application topically 2 (two) times daily. 454 g 0  . VICTOZA 18 MG/3ML SOPN INJECT 1.8 MG SUBCUTANEOUSLY ONCE DAILY. 9 mL 2  . Vitamin D, Ergocalciferol, (DRISDOL) 50000 units CAPS capsule TAKE ONE CAPSULE BY MOUTH ONCE WEEKLY. 12 capsule 0   No current facility-administered medications on file  prior to visit.    No Known Allergies  No results found for this or any previous visit (from the past 2160 hour(s)).  Objective: There were no vitals filed for this visit.  General: Patient is awake, alert, oriented x 3 and in no acute distress.  Dermatology: Skin is warm and dry bilateral with a partial thickness ulceration plantar right hallux with reactive keratosis measures 0.5cm x 0.5cm. No acute signs of infection. Nails are mildly elongated and thickened x 10 with no signs of ingrowing or infection.    Vascular: Dorsalis Pedis pulse = 0/4 Bilateral,  Posterior Tibial pulse = 1/4 Bilateral faint,  Capillary Fill Time < 5 seconds, + chronic venous skin changes bilateral, unna boots in place.   Neurologic:Protective sensation diminished using the 5.07/10g Semmes Weinstein Monofilament. Vibratory diminished bilateral.   Musculosketal:  Mild pain to dorsal midfoot on left with accompany swelling suggestive of Charcot. No Pain with palpation to previous ulcerated area at right hallux. + Hallux interphalangeus R>L hallux, No pain with compression to calves bilateral.   CT- Charcot  Assessment and Plan:  Problem List Items Addressed This Visit      Cardiovascular and Mediastinum   PVD (peripheral vascular disease) ([place])   Relevant Medications   HYDROmorphone (DILAUDID) 4 MG tablet    Other Visit Diagnoses    Toe ulcer, right, limited to breakdown of skin ([place])    -  Primary   HALLUX   Charcot foot due to diabetes mellitus ([place])       Relevant Medications   HYDROmorphone (DILAUDID) 4 MG tablet   Pain       Relevant Medications   HYDROmorphone (DILAUDID) 4 MG tablet   Dermatophytosis of nail         -Examined patient  -Right Hallux ulceration Mechanically debrided using a sterile chisel blade and applied Prisma with offloading dressing to right hallux. [facility] to have [facility] to change dressing once weekly. -Continue with weekly Unna boots as applied bilateral lower  extremities [facility] -Nails 10 mechanically debrided using a sterile nail nipper without incident -Continue with postop shoe for Left midfoot charcot as confirmed by CT -Refilled Dilaudid   -Continue with gabapentin  -Patient to return to office in 1 month for ulcer check on right hallux and for further discussion of Crow boot /ordering of [employer] for left or sooner if problems arise.  Landis Martins, DPM

## 2016-04-21 NOTE — Telephone Encounter (Addendum)
-----   Message from Landis Martins, [place] sent at 04/21/2016 12:29 PM EDT ----- Regarding: Wound care R Hallux ulcer Patient has [facility] coming to house doing unna boots on his legs once a week.  Send orders for weekly right hallux dressing changes consisting of primsa to the ulcer site covered with 2x2 and coban. Thanks Dr Cannon Kettle. Faxed orders on required form with pt demographics to [place].

## 2016-04-22 ENCOUNTER — Other Ambulatory Visit: Payer: Self-pay | Admitting: Family Medicine

## 2016-04-22 DIAGNOSIS — L97819 Non-pressure chronic ulcer of other part of right lower leg with unspecified severity: Secondary | ICD-10-CM | POA: Diagnosis not present

## 2016-04-22 DIAGNOSIS — I10 Essential (primary) hypertension: Secondary | ICD-10-CM | POA: Diagnosis not present

## 2016-04-22 DIAGNOSIS — E119 Type 2 diabetes mellitus without complications: Secondary | ICD-10-CM | POA: Diagnosis not present

## 2016-04-22 DIAGNOSIS — H409 Unspecified glaucoma: Secondary | ICD-10-CM | POA: Diagnosis not present

## 2016-04-22 DIAGNOSIS — I872 Venous insufficiency (chronic) (peripheral): Secondary | ICD-10-CM | POA: Diagnosis not present

## 2016-04-22 DIAGNOSIS — G20 Parkinson's disease: Secondary | ICD-10-CM | POA: Diagnosis not present

## 2016-04-22 DIAGNOSIS — L97829 Non-pressure chronic ulcer of other part of left lower leg with unspecified severity: Secondary | ICD-10-CM | POA: Diagnosis not present

## 2016-04-23 ENCOUNTER — Other Ambulatory Visit: Payer: Self-pay | Admitting: Family Medicine

## 2016-04-23 DIAGNOSIS — I872 Venous insufficiency (chronic) (peripheral): Secondary | ICD-10-CM | POA: Diagnosis not present

## 2016-04-23 DIAGNOSIS — L97829 Non-pressure chronic ulcer of other part of left lower leg with unspecified severity: Secondary | ICD-10-CM | POA: Diagnosis not present

## 2016-04-23 DIAGNOSIS — L97819 Non-pressure chronic ulcer of other part of right lower leg with unspecified severity: Secondary | ICD-10-CM | POA: Diagnosis not present

## 2016-04-24 ENCOUNTER — Ambulatory Visit: Payer: PPO | Admitting: Vascular Surgery

## 2016-04-24 ENCOUNTER — Encounter (HOSPITAL_COMMUNITY): Payer: PPO

## 2016-04-30 DIAGNOSIS — I872 Venous insufficiency (chronic) (peripheral): Secondary | ICD-10-CM | POA: Diagnosis not present

## 2016-04-30 DIAGNOSIS — I10 Essential (primary) hypertension: Secondary | ICD-10-CM | POA: Diagnosis not present

## 2016-04-30 DIAGNOSIS — L97819 Non-pressure chronic ulcer of other part of right lower leg with unspecified severity: Secondary | ICD-10-CM | POA: Diagnosis not present

## 2016-04-30 DIAGNOSIS — G20 Parkinson's disease: Secondary | ICD-10-CM | POA: Diagnosis not present

## 2016-04-30 DIAGNOSIS — L97829 Non-pressure chronic ulcer of other part of left lower leg with unspecified severity: Secondary | ICD-10-CM | POA: Diagnosis not present

## 2016-04-30 DIAGNOSIS — H409 Unspecified glaucoma: Secondary | ICD-10-CM | POA: Diagnosis not present

## 2016-04-30 DIAGNOSIS — E119 Type 2 diabetes mellitus without complications: Secondary | ICD-10-CM | POA: Diagnosis not present

## 2016-05-04 ENCOUNTER — Emergency Department (HOSPITAL_COMMUNITY)
Admission: EM | Admit: 2016-05-04 | Discharge: 2016-05-05 | Disposition: A | Discharge status: Home / Self Care | Payer: PPO | Attending: Emergency Medicine | Admitting: Emergency Medicine

## 2016-05-04 ENCOUNTER — Encounter (HOSPITAL_COMMUNITY): Payer: Self-pay | Admitting: *Deleted

## 2016-05-04 DIAGNOSIS — R1111 Vomiting without nausea: Secondary | ICD-10-CM | POA: Diagnosis not present

## 2016-05-04 DIAGNOSIS — E119 Type 2 diabetes mellitus without complications: Secondary | ICD-10-CM | POA: Diagnosis not present

## 2016-05-04 DIAGNOSIS — I509 Heart failure, unspecified: Secondary | ICD-10-CM | POA: Insufficient documentation

## 2016-05-04 DIAGNOSIS — I110 Hypertensive heart disease with heart failure: Secondary | ICD-10-CM | POA: Insufficient documentation

## 2016-05-04 DIAGNOSIS — Z79899 Other long term (current) drug therapy: Secondary | ICD-10-CM | POA: Diagnosis not present

## 2016-05-04 DIAGNOSIS — T6291XA Toxic effect of unspecified noxious substance eaten as food, accidental (unintentional), initial encounter: Secondary | ICD-10-CM | POA: Insufficient documentation

## 2016-05-04 DIAGNOSIS — R1033 Periumbilical pain: Secondary | ICD-10-CM | POA: Diagnosis not present

## 2016-05-04 DIAGNOSIS — Z87891 Personal history of nicotine dependence: Secondary | ICD-10-CM | POA: Insufficient documentation

## 2016-05-04 DIAGNOSIS — Z794 Long term (current) use of insulin: Secondary | ICD-10-CM | POA: Diagnosis not present

## 2016-05-04 DIAGNOSIS — R110 Nausea: Secondary | ICD-10-CM | POA: Diagnosis not present

## 2016-05-04 DIAGNOSIS — K2970 Gastritis, unspecified, without bleeding: Secondary | ICD-10-CM | POA: Diagnosis not present

## 2016-05-04 DIAGNOSIS — IMO0001 Reserved for inherently not codable concepts without codable children: Secondary | ICD-10-CM

## 2016-05-04 DIAGNOSIS — Z7982 Long term (current) use of aspirin: Secondary | ICD-10-CM | POA: Insufficient documentation

## 2016-05-04 LAB — COMPREHENSIVE METABOLIC PANEL
ALBUMIN: 4 g/dL (ref 3.5–5.0)
ALT: 20 U/L (ref 17–63)
AST: 20 U/L (ref 15–41)
Alkaline Phosphatase: 92 U/L (ref 38–126)
Anion gap: 7 (ref 5–15)
BUN: 19 mg/dL (ref 6–20)
CHLORIDE: 98 mmol/L — AB (ref 101–111)
CO2: 31 mmol/L (ref 22–32)
Calcium: 9.5 mg/dL (ref 8.9–10.3)
Creatinine, Ser: 1.12 mg/dL (ref 0.61–1.24)
GFR calc Af Amer: >60 mL/min (ref >60)
Glucose, Bld: 216 mg/dL — ABNORMAL HIGH (ref 65–99)
POTASSIUM: 5.5 mmol/L — AB (ref 3.5–5.1)
SODIUM: 136 mmol/L (ref 135–145)
Total Bilirubin: 0.9 mg/dL (ref 0.3–1.2)
Total Protein: 7.1 g/dL (ref 6.5–8.1)

## 2016-05-04 LAB — CBG MONITORING, ED
Glucose-Capillary: 199 mg/dL — ABNORMAL HIGH (ref 65–99)
Glucose-Capillary: 207 mg/dL — ABNORMAL HIGH (ref 65–99)

## 2016-05-04 LAB — CBC
HEMATOCRIT: 49.7 % (ref 39.0–52.0)
Hemoglobin: 16.5 g/dL (ref 13.0–17.0)
MCH: 28.9 pg (ref 26.0–34.0)
MCHC: 33.2 g/dL (ref 30.0–36.0)
MCV: 87.2 fL (ref 78.0–100.0)
Platelets: 263 10*3/uL (ref 150–400)
RBC: 5.7 MIL/uL (ref 4.22–5.81)
RDW: 15.4 % (ref 11.5–15.5)
WBC: 18.3 10*3/uL — AB (ref 4.0–10.5)

## 2016-05-04 LAB — LIPASE, BLOOD: LIPASE: 31 U/L (ref 11–51)

## 2016-05-04 MED ORDER — ONDANSETRON HCL 8 MG PO TABS: 8.0000 mg | ORAL_TABLET | Freq: Three times a day (TID) | ORAL | 0 refills | Status: DC | PRN

## 2016-05-04 MED ORDER — ONDANSETRON 4 MG PO TBDP
ORAL_TABLET | ORAL | Status: AC
  Filled 2016-05-04: qty 1

## 2016-05-04 MED ORDER — ONDANSETRON 4 MG PO TBDP
4.0000 mg | ORAL_TABLET | Freq: Once | ORAL | Status: AC | PRN
  Administered 2016-05-04: 4 mg via ORAL

## 2016-05-04 MED ORDER — ONDANSETRON HCL 4 MG PO TABS
4.0000 mg | ORAL_TABLET | Freq: Once | ORAL | Status: AC
  Administered 2016-05-04: 4 mg via ORAL
  Filled 2016-05-04: qty 1

## 2016-05-04 NOTE — ED Provider Notes (Signed)
[name]   CSN: [id number] Arrival date & time: 05/04/16  1556  By signing my name below, I, Royce Macadamia, attest that this documentation has been prepared under the direction and in the presence of Daleen Bo, MD . Electronically Signed: Royce Macadamia, [place]. 05/04/2016. 9:21 PM.  History   Chief Complaint Chief Complaint  Patient presents with  . Abdominal Pain   The history is provided by the patient and medical records. No language interpreter was used.     HPI Comments:  Nicholas [name] is a 60 y.o. male with history of diabetes brought in by ambulance who presents to the Emergency Department complaining of periumbilical adominal pain and vomiting onset yesterday.  Pt states he's thrown up 6 times since yesterday.  He began feeling nauseous after eating food that his church brought over.  He denies history of hernia, fever, diarrhea and sick contacts.  Pt has scabs on his shins, but reports they are not new and that his PCP is treating them.    Past Medical History:  Diagnosis Date  . Chronic knee pain   . Depression   . Diabetes ([place])   . Glaucoma   . Hyperlipidemia   . Hypertension   . MRSA (methicillin resistant Staphylococcus aureus)   . Poor circulation    leg   . Suicide attempt   . Testosterone deficiency     Patient Active Problem List   Diagnosis Date Noted  . GERD (gastroesophageal reflux disease) 04/14/2016  . Recurrent cellulitis of lower leg 03/03/2016  . Cellulitis of left foot 02/14/2016  . Pain of left heel 01/28/2016  . Benign essential tremor 01/28/2016  . Skin lesion of back 10/07/2015  . Back pain with left-sided radiculopathy 06/05/2015  . Hyperlipidemia 04/10/2015  . Benign hypertension 04/10/2015  . Abdominal wall bulge 01/15/2015  . Wrist pain, right 10/15/2014  . Atypical chest pain 07/22/2014  . Hx of adenomatous colonic polyps 02/21/2014  . Candidiasis 11/28/2013  . Allergic rhinitis  07/24/2013  . Onychomycosis 05/21/2013  . 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 Back pain with radiation 04/20/2011  . Seborrheic dermatitis 11/20/2010  . Dermatomycosis 11/20/2010  . FATIGUE 04/01/2010  . CARPAL TUNNEL SYNDROME, BILATERAL 03/24/2010  . Hypogonadism in male 01/27/2010  . DEPRESSION/ANXIETY 01/27/2010  . CHF 01/13/2010  . DYSPNEA 01/13/2010  . Diabetes mellitus type 2 in obese ([place]) 12/12/2009  . MORBID OBESITY 12/12/2009  . Benign hypertension 12/12/2009  . PVD (peripheral vascular disease) ([place]) 12/12/2009    Past Surgical History:  Procedure Laterality Date  . CHOLECYSTECTOMY N/A 02/25/2015   Procedure: LAPAROSCOPIC CHOLECYSTECTOMY;  Surgeon: Aviva Signs, MD;  Location: [facility];  Service: General;  Laterality: N/A;  . COLONOSCOPY  03/2008   [id number] external hemorrhoidal tag, otherwise normal rectum/Two diminutive rectosigmoid polyps s/p bx/ Polyp in the opposite the ileocecal valve s/p bx. tubular adenoma.  . COLONOSCOPY N/A 03/23/2014   [id number] colon polyps throughout his rectum and colon. Sessile and pedunculated. The largest polyp was proximally 6 mm in dimensions in the retum at 3 cm with adjacent diminutive polyp.  There was also a polyps on ileocecal valve, ascending segmen descending and sigmoid segment. as the colon was tortuous & elongated requiring externa abd pressure and changing of the pts postion to reach cecum  . ESOPHAGOGASTRODUODENOSCOPY N/A 03/23/2014   RMR: 1. Patient had circumferential distal esophageal erosions within 5 mm of the GE junction.  No Barrett's esophagus. Tubular esophagus patent  throughout its course. 2.  Diffuse gastric erosions. (1) 30mm area of healing ulceration in the antrum. No ulcer or infiltrating process observed. Patent pylorus. Normal-appearing first and second portion of the duodenum .  . kidney stones     cysto with litholapexy       Home Medications    Prior to Admission  medications   Medication Sig Start Date End Date Taking? Authorizing Provider  aspirin EC 81 MG tablet Take 81 mg by mouth daily.   Yes Historical Provider, MD  brimonidine (ALPHAGAN P) 0.1 % SOLN Place 1 drop into both eyes 2 (two) times daily. One drop in each eye two times a day    Yes Historical Provider, MD  brimonidine-timolol (COMBIGAN) 0.2-0.5 % ophthalmic solution Place 1 drop into both eyes every 12 (twelve) hours. One drop in each eye two times a day     Yes Historical Provider, MD  citalopram (CELEXA) 40 MG tablet TAKE 1 TABLET BY MOUTH ONCE A DAY. 04/22/16  Yes Fayrene Helper, MD  clotrimazole-betamethasone (LOTRISONE) cream APPLY TO AFFECTED AREAS TWICE DAILY. Patient taking differently: Apply 1 application topically daily as needed (for skin irritation).  11/28/13  Yes Fayrene Helper, MD  furosemide (LASIX) 20 MG tablet Take 1 tablet (20 mg total) by mouth daily. 01/09/15  Yes Fayrene Helper, MD  gabapentin (NEURONTIN) 600 MG tablet Take 1 tablet (600 mg total) by mouth 3 (three) times daily. 01/24/16  Yes Fayrene Helper, MD  HUMULIN R U-500 KWIKPEN 500 UNIT/ML kwikpen INJECT UP TO 100 UNITS SUBCUTANEOUSLY 30 MINUTES BEFORE MEALS. 02/05/16  Yes Cassandria Anger, MD  lamoTRIgine (LAMICTAL) 200 MG tablet TAKE 1 TABLET BY MOUTH ONCE A DAY. 03/23/16  Yes Fayrene Helper, MD  lisinopril (PRINIVIL,ZESTRIL) 5 MG tablet TAKE 1 TABLET BY MOUTH ONCE A DAY. 04/24/16  Yes Fayrene Helper, MD  metoprolol succinate (TOPROL-XL) 25 MG 24 hr tablet Take 1 tablet (25 mg total) by mouth daily. 01/28/16  Yes Fayrene Helper, MD  pantoprazole (PROTONIX) 40 MG tablet TAKE (1) TABLET BY MOUTH TWICE DAILY. 03/05/16  Yes Annitta Needs, NP  simvastatin (ZOCOR) 40 MG tablet TAKE 1 TABLET BY MOUTH ONCE A DAY. 03/04/16  Yes Fayrene Helper, MD  Testosterone (ANDROGEL PUMP TD) Place 75 g onto the skin every morning.   Yes Historical Provider, MD  traMADol (ULTRAM) 50 MG tablet TAKE 2 TABLETS BY  MOUTH EVERY 8 HOURS AS NEEDED. 03/31/16  Yes Fayrene Helper, MD  TRAVATAN Z 0.004 % SOLN ophthalmic solution Place 1 drop into both eyes at bedtime.  08/30/15  Yes Historical Provider, MD  VICTOZA 18 MG/3ML SOPN INJECT 1.8 MG SUBCUTANEOUSLY ONCE DAILY. 03/25/16  Yes Cassandria Anger, MD  Vitamin D, Ergocalciferol, (DRISDOL) 50000 units CAPS capsule TAKE ONE CAPSULE BY MOUTH ONCE WEEKLY. 02/24/16  Yes Cassandria Anger, MD  HYDROmorphone (DILAUDID) 4 MG tablet Take 1 tablet (4 mg total) by mouth every 4 (four) hours as needed for severe pain. Patient not taking: Reported on 05/04/2016 04/21/16   Landis Martins, DPM  naproxen (NAPROSYN) 500 MG tablet Take 1 tablet (500 mg total) by mouth 2 (two) times daily with a meal. Patient not taking: Reported on 05/04/2016 02/14/16   Fayrene Helper, MD  ondansetron (ZOFRAN) 8 MG tablet Take 1 tablet (8 mg total) by mouth every 8 (eight) hours as needed for nausea or vomiting. 05/04/16   Daleen Bo, MD  triamcinolone ointment (KENALOG) 0.1 %  Apply 1 application topically 2 (two) times daily. Patient taking differently: Apply 1 application topically daily as needed (for skin irritation).  01/10/16   Cassandria Anger, MD    Family History Family History  Problem Relation Age of Onset  . Diabetes Mother   . Leukemia Mother   . Cancer Father     colon, age greater than 71?  Marland Kitchen Heart disease Father   . Glaucoma Father   . Parkinsonism Father     altzheimers , brain hemorrhage  . Colon cancer Neg Hx     Social History Social History  Substance Use Topics  . Smoking status: Former Smoker    Packs/day: 1.50    Years: 25.00    Types: Cigarettes    Quit date: 02/20/2002  . Smokeless tobacco: Former Systems developer     Comment: Quit x 13 years  . Alcohol use No     Allergies   Patient has no known allergies.   Review of Systems Review of Systems  All other systems reviewed and are negative.    Physical Exam Updated Vital Signs BP 128/59  (BP Location: Left Arm)   Pulse 70   Temp 98 F (36.7 C)   Resp 18   Ht 6\' 1"  (1.854 m)   Wt (!) 435 lb (197.3 kg)   SpO2 95%   BMI 57.39 kg/m   Physical Exam  Constitutional: He appears well-developed and well-nourished.  Morbidly obese.   HENT:  Head: Normocephalic and atraumatic.  Right Ear: External ear normal.  Left Ear: External ear normal.  Eyes: Conjunctivae and EOM are normal. Pupils are equal, round, and reactive to light.  Neck: Neck supple.  Cardiovascular: Normal rate and normal heart sounds.   No murmur heard. Pulmonary/Chest: Effort normal and breath sounds normal. No respiratory distress. He has no wheezes.  Abdominal:  Mild diffuse tenderness without palpable hernia.    Musculoskeletal: Normal range of motion.  Neurological: He is alert.  Skin: Skin is warm and dry.  Old lesion to anterior shins bilaterally which appear to be healing without infection.  Psychiatric: He has a normal mood and affect. His behavior is normal. Judgment and thought content normal.  Nursing note and vitals reviewed.    ED Treatments / Results   DIAGNOSTIC STUDIES:  Oxygen Saturation is 94% on RA, NML by my interpretation.    COORDINATION OF CARE:  9:19 PM Discussed treatment plan with pt at bedside and pt agreed to plan.  Labs (all labs ordered are listed, but only abnormal results are displayed) Labs Reviewed  COMPREHENSIVE METABOLIC PANEL - Abnormal; Notable for the following:       Result Value   Potassium 5.5 (*)    Chloride 98 (*)    Glucose, Bld 216 (*)    All other components within normal limits  CBC - Abnormal; Notable for the following:    WBC 18.3 (*)    All other components within normal limits  CBG MONITORING, ED - Abnormal; Notable for the following:    Glucose-Capillary 199 (*)    All other components within normal limits  CBG MONITORING, ED - Abnormal; Notable for the following:    Glucose-Capillary 207 (*)    All other components within normal  limits  LIPASE, BLOOD  URINALYSIS, ROUTINE W REFLEX MICROSCOPIC (NOT AT [facility])    EKG  EKG Interpretation None       Radiology No results found.  Procedures Procedures (including critical care time)  Medications Ordered in  ED Medications  ondansetron (ZOFRAN-ODT) 4 MG disintegrating tablet (not administered)  ondansetron (ZOFRAN-ODT) disintegrating tablet 4 mg (4 mg Oral Given 05/04/16 1606)  ondansetron (ZOFRAN) tablet 4 mg (4 mg Oral Given 05/04/16 2112)     Initial Impression / Assessment and Plan / ED Course  I have reviewed the triage vital signs and the nursing notes.  Pertinent labs & imaging results that were available during my care of the patient were reviewed by me and considered in my medical decision making (see chart for details).  Clinical Course     Medications  ondansetron (ZOFRAN-ODT) 4 MG disintegrating tablet (not administered)  ondansetron (ZOFRAN-ODT) disintegrating tablet 4 mg (4 mg Oral Given 05/04/16 1606)  ondansetron (ZOFRAN) tablet 4 mg (4 mg Oral Given 05/04/16 2112)    Patient Vitals for the past 24 hrs:  BP Temp Temp src Pulse Resp SpO2 Height Weight  05/05/16 0008 128/59 - - 70 18 95 % - -  05/04/16 2249 (!) 143/42 98 F (36.7 C) - 75 16 95 % - -  05/04/16 2002 100/89 97.7 F (36.5 C) Oral 72 16 94 % - -  05/04/16 1930 113/62 - - - - - - -  05/04/16 1602 151/68 98.2 F (36.8 C) Oral 84 24 92 % - -  05/04/16 1601 - - - - - - 6\' 1"  (1.854 m) (!) 435 lb (197.3 kg)    At D/C Reevaluation with update and discussion. After initial assessment and treatment, an updated evaluation reveals He feels better and has been able to tolerate some oral fluids. Findings discussed with patient. Brinly Maietta L    Final Clinical Impressions(s) / ED Diagnoses   Final diagnoses:  Food poisoning, accidental or unintentional, initial encounter   Evaluation consistent with likely food toxin poisoning. Doubt serous bacterial infection or metabolic  instability  Nursing Notes Reviewed/ Care Coordinated Applicable Imaging Reviewed Interpretation of Laboratory Data incorporated into ED treatment  The patient appears reasonably screened and/or stabilized for discharge and I doubt any other medical condition or other [facility] requiring further screening, evaluation, or treatment in the ED at this time prior to discharge.  Plan: Home Medications- continue; Home Treatments- rest, gradually advance diet; return here if the recommended treatment, does not improve the symptoms; Recommended follow up- PCP prn   New Prescriptions Discharge Medication List as of 05/04/2016 11:58 PM    START taking these medications   Details  ondansetron (ZOFRAN) 8 MG tablet Take 1 tablet (8 mg total) by mouth every 8 (eight) hours as needed for nausea or vomiting., Starting Mon 05/04/2016, Print       I personally performed the services described in this documentation, which was scribed in my presence. The recorded information has been reviewed and is accurate.    Daleen Bo, MD 05/05/16 [phone]

## 2016-05-04 NOTE — ED Triage Notes (Signed)
Pt comes in by EMS from home for abdominal pain. States his pain started last night in the center of his stomach. Pt has been vomiting. Pt denies diarrhea. Pt tearful in triage.

## 2016-05-04 NOTE — ED Notes (Signed)
Per [facility] attached fall bracelet and fall socks

## 2016-05-04 NOTE — Discharge Instructions (Signed)
Gradually advance your diet, starting with clear liquids first.

## 2016-05-05 NOTE — ED Notes (Signed)
Pt states understanding of care given and follow up instructions.  Pt ambulated from ED to waiting room

## 2016-05-06 ENCOUNTER — Other Ambulatory Visit: Payer: Self-pay | Admitting: "Endocrinology

## 2016-05-06 DIAGNOSIS — E1121 Type 2 diabetes mellitus with diabetic nephropathy: Secondary | ICD-10-CM | POA: Diagnosis not present

## 2016-05-06 DIAGNOSIS — E291 Testicular hypofunction: Secondary | ICD-10-CM | POA: Diagnosis not present

## 2016-05-06 LAB — HEMOGLOBIN A1C
HEMOGLOBIN A1C: 5.8 % — AB (ref <5.7)
MEAN PLASMA GLUCOSE: 120 mg/dL

## 2016-05-06 LAB — COMPLETE METABOLIC PANEL WITH GFR
ALBUMIN: 3.9 g/dL (ref 3.6–5.1)
ALK PHOS: 74 U/L (ref 40–115)
ALT: 13 U/L (ref 9–46)
AST: 15 U/L (ref 10–35)
BILIRUBIN TOTAL: 0.5 mg/dL (ref 0.2–1.2)
BUN: 18 mg/dL (ref 7–25)
CALCIUM: 9.6 mg/dL (ref 8.6–10.3)
CHLORIDE: 102 mmol/L (ref 98–110)
CO2: 31 mmol/L (ref 20–31)
CREATININE: 1.26 mg/dL — AB (ref 0.70–1.25)
GFR, EST AFRICAN AMERICAN: 71 mL/min (ref >=60)
GFR, Est Non African American: 61 mL/min (ref >=60)
Glucose, Bld: 138 mg/dL — ABNORMAL HIGH (ref 65–99)
Potassium: 5.1 mmol/L (ref 3.5–5.3)
Sodium: 142 mmol/L (ref 135–146)
Total Protein: 6 g/dL — ABNORMAL LOW (ref 6.1–8.1)

## 2016-05-10 ENCOUNTER — Other Ambulatory Visit: Payer: Self-pay | Admitting: Family Medicine

## 2016-05-13 ENCOUNTER — Ambulatory Visit (INDEPENDENT_AMBULATORY_CARE_PROVIDER_SITE_OTHER): Payer: PPO | Admitting: "Endocrinology

## 2016-05-13 ENCOUNTER — Encounter: Payer: Self-pay | Admitting: "Endocrinology

## 2016-05-13 ENCOUNTER — Other Ambulatory Visit: Payer: Self-pay | Admitting: Family Medicine

## 2016-05-13 VITALS — BP 108/57 | HR 106 | Ht 72.0 in | Wt >= 6400 oz

## 2016-05-13 DIAGNOSIS — E782 Mixed hyperlipidemia: Secondary | ICD-10-CM | POA: Diagnosis not present

## 2016-05-13 DIAGNOSIS — E1169 Type 2 diabetes mellitus with other specified complication: Secondary | ICD-10-CM | POA: Diagnosis not present

## 2016-05-13 DIAGNOSIS — I10 Essential (primary) hypertension: Secondary | ICD-10-CM

## 2016-05-13 DIAGNOSIS — E669 Obesity, unspecified: Secondary | ICD-10-CM | POA: Diagnosis not present

## 2016-05-13 NOTE — Progress Notes (Signed)
Subjective:    Patient ID: Nicholas [name], male    DOB: [birth year],    Past Medical History:  Diagnosis Date  . Chronic knee pain   . Depression   . Diabetes ([place])   . Glaucoma   . Hyperlipidemia   . Hypertension   . MRSA (methicillin resistant Staphylococcus aureus)   . Poor circulation    leg   . Suicide attempt   . Testosterone deficiency    Past Surgical History:  Procedure Laterality Date  . CHOLECYSTECTOMY N/A 02/25/2015   Procedure: LAPAROSCOPIC CHOLECYSTECTOMY;  Surgeon: Aviva Signs, MD;  Location: [facility];  Service: General;  Laterality: N/A;  . COLONOSCOPY  03/2008   [id number] external hemorrhoidal tag, otherwise normal rectum/Two diminutive rectosigmoid polyps s/p bx/ Polyp in the opposite the ileocecal valve s/p bx. tubular adenoma.  . COLONOSCOPY N/A 03/23/2014   [id number] colon polyps throughout his rectum and colon. Sessile and pedunculated. The largest polyp was proximally 6 mm in dimensions in the retum at 3 cm with adjacent diminutive polyp.  There was also a polyps on ileocecal valve, ascending segmen descending and sigmoid segment. as the colon was tortuous & elongated requiring externa abd pressure and changing of the pts postion to reach cecum  . ESOPHAGOGASTRODUODENOSCOPY N/A 03/23/2014   RMR: 1. Patient had circumferential distal esophageal erosions within 5 mm of the GE junction.  No Barrett's esophagus. Tubular esophagus patent throughout its course. 2.  Diffuse gastric erosions. (1) 44mm area of healing ulceration in the antrum. No ulcer or infiltrating process observed. Patent pylorus. Normal-appearing first and second portion of the duodenum .  . kidney stones     cysto with litholapexy   Social History   Social History  . Marital status: Divorced    Spouse name: N/A  . Number of children: 0  . Years of education: 12   Occupational History  . DISABLED   .  Unemployed   Social History Main Topics  . Smoking status: Former Smoker   Packs/day: 1.50    Years: 25.00    Types: Cigarettes    Quit date: 02/20/2002  . Smokeless tobacco: Former Systems developer     Comment: Quit x 13 years  . Alcohol use No  . Drug use: No  . Sexual activity: Not Currently    Birth control/ protection: None   Other Topics Concern  . None   Social History Narrative  . None   Outpatient Encounter Prescriptions as of 05/13/2016  Medication Sig  . Insulin Regular Human (HUMULIN R U-500 KWIKPEN [place]) Inject 50 Units into the skin 3 (three) times daily with meals.  Marland Kitchen aspirin EC 81 MG tablet Take 81 mg by mouth daily.  . brimonidine (ALPHAGAN P) 0.1 % SOLN Place 1 drop into both eyes 2 (two) times daily. One drop in each eye two times a day   . brimonidine-timolol (COMBIGAN) 0.2-0.5 % ophthalmic solution Place 1 drop into both eyes every 12 (twelve) hours. One drop in each eye two times a day    . citalopram (CELEXA) 40 MG tablet TAKE 1 TABLET BY MOUTH ONCE A DAY.  . clotrimazole-betamethasone (LOTRISONE) cream APPLY TO AFFECTED AREAS TWICE DAILY. (Patient taking differently: Apply 1 application topically daily as needed (for skin irritation). )  . furosemide (LASIX) 20 MG tablet Take 1 tablet (20 mg total) by mouth daily.  Marland Kitchen gabapentin (NEURONTIN) 600 MG tablet TAKE ONE TABLET BY MOUTH 3 TIMES DAILY.  Marland Kitchen lamoTRIgine (LAMICTAL) 200 MG tablet  TAKE 1 TABLET BY MOUTH ONCE A DAY.  Marland Kitchen lisinopril (PRINIVIL,ZESTRIL) 5 MG tablet TAKE 1 TABLET BY MOUTH ONCE A DAY.  . metoprolol succinate (TOPROL-XL) 25 MG 24 hr tablet Take 1 tablet (25 mg total) by mouth daily.  . ondansetron (ZOFRAN) 8 MG tablet Take 1 tablet (8 mg total) by mouth every 8 (eight) hours as needed for nausea or vomiting.  . pantoprazole (PROTONIX) 40 MG tablet TAKE (1) TABLET BY MOUTH TWICE DAILY.  . simvastatin (ZOCOR) 40 MG tablet TAKE 1 TABLET BY MOUTH ONCE A DAY.  Marland Kitchen Testosterone (ANDROGEL PUMP TD) Place 75 g onto the skin every morning.  . traMADol (ULTRAM) 50 MG tablet TAKE 2 TABLETS BY MOUTH  EVERY 8 HOURS AS NEEDED.  Marland Kitchen TRAVATAN Z 0.004 % SOLN ophthalmic solution Place 1 drop into both eyes at bedtime.   . triamcinolone ointment (KENALOG) 0.1 % Apply 1 application topically 2 (two) times daily. (Patient taking differently: Apply 1 application topically daily as needed (for skin irritation). )  . VICTOZA 18 MG/3ML SOPN INJECT 1.8 MG SUBCUTANEOUSLY ONCE DAILY.  Marland Kitchen Vitamin D, Ergocalciferol, (DRISDOL) 50000 units CAPS capsule TAKE ONE CAPSULE BY MOUTH ONCE WEEKLY.  . [DISCONTINUED] HUMULIN R U-500 KWIKPEN 500 UNIT/ML kwikpen INJECT UP TO 100 UNITS SUBCUTANEOUSLY 30 MINUTES BEFORE MEALS.  . [DISCONTINUED] HYDROmorphone (DILAUDID) 4 MG tablet Take 1 tablet (4 mg total) by mouth every 4 (four) hours as needed for severe pain. (Patient not taking: Reported on 05/04/2016)  . [DISCONTINUED] naproxen (NAPROSYN) 500 MG tablet Take 1 tablet (500 mg total) by mouth 2 (two) times daily with a meal. (Patient not taking: Reported on 05/04/2016)   No facility-administered encounter medications on file as of 05/13/2016.    ALLERGIES: No Known Allergies VACCINATION STATUS: Immunization History  Administered Date(s) Administered  . Influenza Whole 04/01/2010  . Influenza,inj,Quad PF,36+ Mos 03/08/2013  . Pneumococcal Conjugate-13 07/09/2014  . Pneumococcal Polysaccharide-23 04/01/2010, 10/07/2015  . Tdap 11/24/2011  . Zoster 12/12/2014    Diabetes  He presents for his follow-up diabetic visit. He has type 2 diabetes mellitus. His disease course has been improving. There are no hypoglycemic associated symptoms. Pertinent negatives for hypoglycemia include no confusion, headaches, pallor or seizures. There are no diabetic associated symptoms. Pertinent negatives for diabetes include no chest pain, no fatigue, no polydipsia, no polyphagia, no polyuria and no weakness. There are no hypoglycemic complications. Symptoms are improving. There are no diabetic complications. He is compliant with treatment most  of the time. His weight is decreasing steadily (He states this is mainly due to recent gastroenteritis.). He is following a generally unhealthy diet. He rarely participates in exercise. His home blood glucose trend is decreasing steadily. His breakfast blood glucose range is generally 130-140 mg/dl. His lunch blood glucose range is generally 110-130 mg/dl. His dinner blood glucose range is generally 130-140 mg/dl. His overall blood glucose range is 130-140 mg/dl. An ACE inhibitor/angiotensin II receptor blocker is being taken.  Hyperlipidemia  This is a chronic problem. The current episode started more than 1 year ago. Exacerbating diseases include diabetes and obesity. Pertinent negatives include no chest pain, myalgias or shortness of breath. Current antihyperlipidemic treatment includes statins. Risk factors for coronary artery disease include a sedentary lifestyle, male sex, diabetes mellitus, dyslipidemia and obesity.     Review of Systems  Constitutional: Negative for fatigue and unexpected weight change.  HENT: Negative for dental problem, mouth sores and trouble swallowing.   Eyes: Negative for visual disturbance.  Respiratory: Negative  for cough, choking, chest tightness, shortness of breath and wheezing.   Cardiovascular: Negative for chest pain, palpitations and leg swelling.  Gastrointestinal: Negative for abdominal distention, abdominal pain, constipation, diarrhea, nausea and vomiting.  Endocrine: Negative for polydipsia, polyphagia and polyuria.  Genitourinary: Negative for dysuria, flank pain, hematuria and urgency.  Musculoskeletal: Negative for back pain, gait problem, myalgias and neck pain.  Skin: Negative for pallor, rash and wound.  Neurological: Negative for seizures, syncope, weakness, numbness and headaches.  Psychiatric/Behavioral: Negative.  Negative for confusion and dysphoric mood.    Objective:    BP (!) 108/57   Pulse (!) 106   Ht 6' (1.829 m)   Wt (!) 413 lb  (187.3 kg)   BMI 56.01 kg/m   Wt Readings from Last 3 Encounters:  05/13/16 (!) 413 lb (187.3 kg)  05/04/16 (!) 435 lb (197.3 kg)  04/14/16 (!) 438 lb (198.7 kg)    Physical Exam  Constitutional: He is oriented to person, place, and time. He appears well-developed and well-nourished. He is cooperative. No distress.  HENT:  Head: Normocephalic and atraumatic.  Eyes: EOM are normal.  Neck: Normal range of motion. Neck supple. No tracheal deviation present. No thyromegaly present.  Cardiovascular: Normal rate, S1 normal, S2 normal and normal heart sounds.  Exam reveals no gallop.   No murmur heard. Pulses:      Dorsalis pedis pulses are 1+ on the right side, and 1+ on the left side.       Posterior tibial pulses are 1+ on the right side, and 1+ on the left side.  Pulmonary/Chest: Breath sounds normal. No respiratory distress. He has no wheezes.  Abdominal: Soft. Bowel sounds are normal. He exhibits no distension. There is no tenderness. There is no guarding and no CVA tenderness.  Musculoskeletal: He exhibits no edema.       Right shoulder: He exhibits no swelling and no deformity.  Large lower extremities with skin dryness and lateral eczema on the left lower leg.  Neurological: He is alert and oriented to person, place, and time. He has normal strength and normal reflexes. No cranial nerve deficit or sensory deficit. Gait normal.  Skin: Skin is warm and dry. No rash noted. No cyanosis. Nails show no clubbing.  He has extensive tattoos.  Psychiatric: He has a normal mood and affect. His speech is normal and behavior is normal. Judgment and thought content normal. Cognition and memory are normal.    Results for orders placed or performed in visit on 05/06/16  COMPLETE METABOLIC PANEL WITH GFR  Result Value Ref Range   Sodium 142 135 - 146 mmol/L   Potassium 5.1 3.5 - 5.3 mmol/L   Chloride 102 98 - 110 mmol/L   CO2 31 20 - 31 mmol/L   Glucose, Bld 138 (H) 65 - 99 mg/dL   BUN 18 7 -  25 mg/dL   Creat 1.26 (H) 0.70 - 1.25 mg/dL   Total Bilirubin 0.5 0.2 - 1.2 mg/dL   Alkaline Phosphatase 74 40 - 115 U/L   AST 15 10 - 35 U/L   ALT 13 9 - 46 U/L   Total Protein 6.0 (L) 6.1 - 8.1 g/dL   Albumin 3.9 3.6 - 5.1 g/dL   Calcium 9.6 8.6 - 10.3 mg/dL   GFR, Est African American 71 >=60 mL/min   GFR, Est Non African American 61 >=60 mL/min  Hemoglobin A1c  Result Value Ref Range   Hgb A1c MFr Bld 5.8 (H) <5.7 %  Mean Plasma Glucose 120 mg/dL  Diabetic Labs (most recent): Lab Results  Component Value Date   HGBA1C 5.8 (H) 05/06/2016   HGBA1C 6.5 (H) 01/03/2016   HGBA1C 6.3 (H) 10/01/2015   Lipid Panel     Component Value Date/Time   CHOL 150 10/30/2015 1112   TRIG 124 10/30/2015 1112   HDL 48 10/30/2015 1112   CHOLHDL 3.1 10/30/2015 1112   VLDL 25 10/30/2015 1112   LDLCALC 77 10/30/2015 1112    Assessment & Plan:   1. 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  Patient came with Improved glycemic profile on U500 insulin taking 80 units 3 times a day before meals. His recent A1c of  5.8% generally improving from 9 %.   -He did  Not have hypoglycemia , did have some rare random readings in the 60s.  - Patient remains at a high risk for more acute and chronic complications of diabetes which include CAD, CVA, CKD, retinopathy, and neuropathy. These are all discussed in detail with the patient.  - I have re-counseled the patient on diet management and weight loss  by adopting a carbohydrate restricted / protein rich  Diet. - Patient is advised to stick to a routine mealtimes to eat 3 meals  a day and avoid unnecessary snacks ( to snack only to correct hypoglycemia).  - Suggestion is made for patient to avoid simple carbohydrates   from their diet including Cakes , Desserts, Ice Cream,  Soda (  diet and regular) , Sweet Tea , Candies,  Chips, Cookies, Artificial Sweeteners,   and  "Sugar-free" Products .  This will help patient to have stable blood glucose profile and potentially avoid unintended  Weight gain.  - The patient  has been  scheduled with Jearld Fenton, RDN, CDE for individualized DM education. - I have approached patient with the following individualized plan to manage diabetes and patient agrees.  - I will decrease his Humulin U500  to 50 units 3 times a day before meals for pre-meal blood glucose above 90 mg/dL associated with strict monitoring of glucose  AC and HS. - Patient is warned not to take insulin without proper monitoring per orders.  -Patient is encouraged to call clinic for blood glucose levels less than 70 or above 300 mg /dl. - I will continue Victoza 1.8 mg subcutaneous daily, therapeutically suitable for patient.. -Patient does not tolerate metformin.   - Patient specific target  for A1c; LDL, HDL, Triglycerides, and  Waist Circumference were discussed in detail.  2) BP/HTN: Controlled. Continue current medications including ACEI. 3) Lipids/HPL:  continue statins. 4)  Weight/Diet: CDE consult has been initiated, exercise, and carbohydrates information provided. He recently lost 37 pounds mainly due to gastroenteritis.  5) Chronic Care/Health Maintenance:  -Patient is on ACEI and Statin medications and encouraged to continue to follow up with Ophthalmology, Podiatrist at least yearly or according to recommendations, and advised to  stay away from smoking. I have recommended yearly flu vaccine and pneumonia vaccination at least every 5 years; moderate intensity exercise for up to 150 minutes weekly; and  sleep for at least 7 hours a day. I refilled his Kenalog ointment given to him for bilateral lower extremity dryness and eczema  .  I advised patient to maintain close follow up with their PCP for primary care needs.  Patient is asked to bring meter and  blood glucose logs  during their next visit.   Follow up plan: Return in about 3  months (around 08/13/2016) for follow up with pre-visit labs, meter, and logs.  Glade Lloyd, MD Phone: [phone]  Fax: [phone]   05/13/2016, 11:10 AM

## 2016-05-13 NOTE — Patient Instructions (Signed)

## 2016-05-15 ENCOUNTER — Ambulatory Visit (INDEPENDENT_AMBULATORY_CARE_PROVIDER_SITE_OTHER): Payer: PPO | Admitting: Urology

## 2016-05-15 DIAGNOSIS — E291 Testicular hypofunction: Secondary | ICD-10-CM

## 2016-05-19 ENCOUNTER — Ambulatory Visit (INDEPENDENT_AMBULATORY_CARE_PROVIDER_SITE_OTHER): Payer: PPO | Admitting: Sports Medicine

## 2016-05-19 ENCOUNTER — Encounter: Payer: Self-pay | Admitting: Sports Medicine

## 2016-05-19 ENCOUNTER — Telehealth: Payer: Self-pay | Admitting: *Deleted

## 2016-05-19 DIAGNOSIS — R52 Pain, unspecified: Secondary | ICD-10-CM

## 2016-05-19 DIAGNOSIS — E11610 Type 2 diabetes mellitus with diabetic neuropathic arthropathy: Secondary | ICD-10-CM | POA: Diagnosis not present

## 2016-05-19 DIAGNOSIS — I739 Peripheral vascular disease, unspecified: Secondary | ICD-10-CM | POA: Diagnosis not present

## 2016-05-19 NOTE — Telephone Encounter (Signed)
Ok great. Saw your note after I sent you a reminder message. Make sure he gets an appt with betha Thanks Dr. Cannon Kettle

## 2016-05-19 NOTE — Telephone Encounter (Addendum)
Dr. Cannon Kettle asked if [facility] cast for [facility]. I spoke with Benjie Karvonen and she states she does, it will need to be insurance authorized. I spoke with S. Durant and she states [employer] gets the authorizations. L. Guadalupe Maple was informed and she is to set pt up with [facility] for castings. 05/29/2016-Pt request pain medication. I do not see that Dr. Cannon Kettle has written for pain medication. I told pt I did see that Dr. Moshe Cipro had written for Tramadol and he should call her for the refill. Pt states Dr. Cannon Kettle and Dr. Moshe Cipro states they won't write for anything stronger. I told pt I felt he would benefit from an appt to discuss his pain management with Dr. Cannon Kettle. Pt states it won't help. I transferred pt to schedulers. Scheduler D. Miner states pt didn't schedule he said he would wait for the boot. 06/25/2016-Pt states he was to get compression hose but hasn't heard anything. 06/26/2016-I explained to pt that Dr. Cannon Kettle had not ordered  Compression hose, but had ordered a brace to be made for him and asked if he had the brace made yet and pt said no. I told pt Dr. Cannon Kettle would like to get Medical Clearance to have him treated for his edema with a lymphedema pump. Pt states sees Dr. Tula Nakayama in [place] [phone]. Dr. Joycelyn Schmid Simpson's office is closed after noon on Fridays. 06/30/2016-Faxed letter requesting medical clearance for lymphedema pump to Dr. Tula Nakayama. 07/06/2016-Received Medical Clearance from Dr. Tula Nakayama. 07/07/2016-Faxed required [place] form and demographics for lymphedema pump.

## 2016-05-19 NOTE — Progress Notes (Signed)
Patient ID: Nicholas [name], male   DOB: 1955/12/08, 60 y.o.   MRN: [id number]  Subjective: Nicholas [name] is a 60 y.o. Diabetic male patient seen in office for  f/u of pain in left foot; reports numbness to toes and pain in left foot uses walker to help him walk. Had home nursing who helped to heal up venous leg ulcerations and toe ulcer. Denies any other symptoms.    Patient Active Problem List   Diagnosis Date Noted  . GERD (gastroesophageal reflux disease) 04/14/2016  . Recurrent cellulitis of lower leg 03/03/2016  . Cellulitis of left foot 02/14/2016  . Pain of left heel 01/28/2016  . Benign essential tremor 01/28/2016  . Skin lesion of back 10/07/2015  . Back pain with left-sided radiculopathy 06/05/2015  . Hyperlipidemia 04/10/2015  . Abdominal wall bulge 01/15/2015  . Wrist pain, right 10/15/2014  . Atypical chest pain 07/22/2014  . Hx of adenomatous colonic polyps 02/21/2014  . Candidiasis 11/28/2013  . Allergic rhinitis 07/24/2013  . Onychomycosis 05/21/2013  . 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 Back pain with radiation 04/20/2011  . Seborrheic dermatitis 11/20/2010  . Dermatomycosis 11/20/2010  . FATIGUE 04/01/2010  . CARPAL TUNNEL SYNDROME, BILATERAL 03/24/2010  . Hypogonadism in male 01/27/2010  . DEPRESSION/ANXIETY 01/27/2010  . CHF 01/13/2010  . DYSPNEA 01/13/2010  . Diabetes mellitus type 2 in obese ([place]) 12/12/2009  . MORBID OBESITY 12/12/2009  . Benign hypertension 12/12/2009  . PVD (peripheral vascular disease) ([place]) 12/12/2009   Current Outpatient Prescriptions on File Prior to Visit  Medication Sig Dispense Refill  . aspirin EC 81 MG tablet Take 81 mg by mouth daily.    . brimonidine (ALPHAGAN P) 0.1 % SOLN Place 1 drop into both eyes 2 (two) times daily. One drop in each eye two times a day     . brimonidine-timolol (COMBIGAN) 0.2-0.5 % ophthalmic solution Place 1 drop into both eyes every 12 (twelve) hours. One drop  in each eye two times a day      . citalopram (CELEXA) 40 MG tablet TAKE 1 TABLET BY MOUTH ONCE A DAY. 30 tablet 3  . clotrimazole-betamethasone (LOTRISONE) cream APPLY TO AFFECTED AREAS TWICE DAILY. (Patient taking differently: Apply 1 application topically daily as needed (for skin irritation). ) 45 g 0  . furosemide (LASIX) 20 MG tablet Take 1 tablet (20 mg total) by mouth daily. 30 tablet 1  . gabapentin (NEURONTIN) 600 MG tablet TAKE ONE TABLET BY MOUTH 3 TIMES DAILY. 90 tablet 0  . Insulin Regular Human (HUMULIN R U-500 KWIKPEN [place]) Inject 50 Units into the skin 3 (three) times daily with meals.    . lamoTRIgine (LAMICTAL) 200 MG tablet TAKE 1 TABLET BY MOUTH ONCE A DAY. 30 tablet 3  . lisinopril (PRINIVIL,ZESTRIL) 5 MG tablet TAKE 1 TABLET BY MOUTH ONCE A DAY. 30 tablet 3  . metoprolol succinate (TOPROL-XL) 25 MG 24 hr tablet Take 1 tablet (25 mg total) by mouth daily. 30 tablet 4  . ondansetron (ZOFRAN) 8 MG tablet Take 1 tablet (8 mg total) by mouth every 8 (eight) hours as needed for nausea or vomiting. 20 tablet 0  . pantoprazole (PROTONIX) 40 MG tablet TAKE (1) TABLET BY MOUTH TWICE DAILY. 60 tablet 3  . simvastatin (ZOCOR) 40 MG tablet TAKE 1 TABLET BY MOUTH ONCE A DAY. 30 tablet 3  . Testosterone (ANDROGEL PUMP TD) Place 75 g onto the skin every morning.    . traMADol (  ULTRAM) 50 MG tablet TAKE 2 TABLETS BY MOUTH EVERY 8 HOURS AS NEEDED. 180 tablet 0  . TRAVATAN Z 0.004 % SOLN ophthalmic solution Place 1 drop into both eyes at bedtime.     . triamcinolone ointment (KENALOG) 0.1 % Apply 1 application topically 2 (two) times daily. (Patient taking differently: Apply 1 application topically daily as needed (for skin irritation). ) 454 g 0  . VICTOZA 18 MG/3ML SOPN INJECT 1.8 MG SUBCUTANEOUSLY ONCE DAILY. 9 mL 2  . Vitamin D, Ergocalciferol, (DRISDOL) 50000 units CAPS capsule TAKE ONE CAPSULE BY MOUTH ONCE WEEKLY. 12 capsule 0   No current facility-administered medications on file prior  to visit.    No Known Allergies  Recent Results (from the past 2160 hour(s))  POC CBG, ED     Status: Abnormal   Collection Time: 05/04/16  4:05 PM  Result Value Ref Range   Glucose-Capillary 199 (H) 65 - 99 mg/dL  Lipase, blood     Status: None   Collection Time: 05/04/16  5:07 PM  Result Value Ref Range   Lipase 31 11 - 51 U/L  Comprehensive metabolic panel     Status: Abnormal   Collection Time: 05/04/16  5:07 PM  Result Value Ref Range   Sodium 136 135 - 145 mmol/L   Potassium 5.5 (H) 3.5 - 5.1 mmol/L   Chloride 98 (L) 101 - 111 mmol/L   CO2 31 22 - 32 mmol/L   Glucose, Bld 216 (H) 65 - 99 mg/dL   BUN 19 6 - 20 mg/dL   Creatinine, Ser 1.12 0.61 - 1.24 mg/dL   Calcium 9.5 8.9 - 10.3 mg/dL   Total Protein 7.1 6.5 - 8.1 g/dL   Albumin 4.0 3.5 - 5.0 g/dL   AST 20 15 - 41 U/L   ALT 20 17 - 63 U/L   Alkaline Phosphatase 92 38 - 126 U/L   Total Bilirubin 0.9 0.3 - 1.2 mg/dL   GFR calc non Af Amer >60 >60 mL/min   GFR calc Af Amer >60 >60 mL/min    Comment: (NOTE) The eGFR has been calculated using the CKD EPI equation. This calculation has not been validated in all clinical situations. eGFR's persistently <60 mL/min signify possible Chronic Kidney Disease.    Anion gap 7 5 - 15  CBC     Status: Abnormal   Collection Time: 05/04/16  5:07 PM  Result Value Ref Range   WBC 18.3 (H) 4.0 - 10.5 K/uL   RBC 5.70 4.22 - 5.81 MIL/uL   Hemoglobin 16.5 13.0 - 17.0 g/dL   HCT 49.7 39.0 - 52.0 %   MCV 87.2 78.0 - 100.0 fL   MCH 28.9 26.0 - 34.0 pg   MCHC 33.2 30.0 - 36.0 g/dL   RDW 15.4 11.5 - 15.5 %   Platelets 263 150 - 400 K/uL  CBG monitoring, ED     Status: Abnormal   Collection Time: 05/04/16  7:08 PM  Result Value Ref Range   Glucose-Capillary 207 (H) 65 - 99 mg/dL  COMPLETE METABOLIC PANEL WITH GFR     Status: Abnormal   Collection Time: 05/06/16  8:33 AM  Result Value Ref Range   Sodium 142 135 - 146 mmol/L   Potassium 5.1 3.5 - 5.3 mmol/L   Chloride 102 98 - 110  mmol/L   CO2 31 20 - 31 mmol/L   Glucose, Bld 138 (H) 65 - 99 mg/dL   BUN 18 7 - 25 mg/dL  Creat 1.26 (H) 0.70 - 1.25 mg/dL    Comment:   For patients > or = 61 years of age: The upper reference limit for Creatinine is approximately 13% higher for people identified as African-American.      Total Bilirubin 0.5 0.2 - 1.2 mg/dL   Alkaline Phosphatase 74 40 - 115 U/L   AST 15 10 - 35 U/L   ALT 13 9 - 46 U/L   Total Protein 6.0 (L) 6.1 - 8.1 g/dL   Albumin 3.9 3.6 - 5.1 g/dL   Calcium 9.6 8.6 - 10.3 mg/dL   GFR, Est African American 71 >=60 mL/min   GFR, Est Non African American 61 >=60 mL/min  Hemoglobin A1c     Status: Abnormal   Collection Time: 05/06/16  8:33 AM  Result Value Ref Range   Hgb A1c MFr Bld 5.8 (H) <5.7 %    Comment:   For someone without known diabetes, a hemoglobin A1c value between 5.7% and 6.4% is consistent with prediabetes and should be confirmed with a follow-up test.   For someone with known diabetes, a value <7% indicates that their diabetes is well controlled. A1c targets should be individualized based on duration of diabetes, age, co-morbid conditions and other considerations.   This assay result is consistent with an increased risk of diabetes.   Currently, no consensus exists regarding use of hemoglobin A1c for diagnosis of diabetes in children.      Mean Plasma Glucose 120 mg/dL    Objective: There were no vitals filed for this visit.  General: Patient is awake, alert, oriented x 3 and in no acute distress.  Dermatology: Skin is warm and dry bilateral with a healed right hallux ulcer with reactive keratosis. Nails are short and thick with no signs of ingrowing or infection.    Vascular: Dorsalis Pedis pulse = 0/4 Bilateral,  Posterior Tibial pulse = 1/4 Bilateral faint,  Capillary Fill Time < 5 seconds, + chronic venous skin changes bilateral, unna boots in place.   Neurologic:Protective sensation diminished using the 5.07/10g Semmes  Weinstein Monofilament. Vibratory diminished bilateral.   Musculosketal:  Mild pain to dorsal midfoot on left with accompanying  swelling suggestive of Charcot. No Pain with palpation to previous ulcerated area at right hallux. + Hallux interphalangeus R>L hallux, No pain with compression to calves bilateral.   CT- Charcot  Assessment and Plan:  Problem List Items Addressed This Visit      Cardiovascular and Mediastinum   PVD (peripheral vascular disease) ([place])    Other Visit Diagnoses    Charcot foot due to diabetes mellitus ([place])    -  Primary   Pain         -Examined patient  -Recommend Continue with postop shoe for Left midfoot charcot as confirmed by CT -Continue with Tramadol for pain as Rx by Dr. Moshe Cipro -Continue with gabapentin  -Patient to return to office for casting for [facility] boot for left or sooner if problems arise.  Landis Martins, DPM

## 2016-05-26 ENCOUNTER — Telehealth: Payer: Self-pay

## 2016-05-26 NOTE — Telephone Encounter (Signed)
Nothing stronger form this office, if shoulder pain a newor worsening  prob, needs ortho to eval, refer if he agrees, may need arthroscopy, intra articular injection etc

## 2016-05-27 NOTE — Telephone Encounter (Signed)
Called and left message for patient to return call.  

## 2016-05-27 NOTE — Telephone Encounter (Signed)
Spoke with patient and he is aware of advice.  He states that his foot pain is more bothersome than shoulder.  See notes in last visit with Dr. Cannon Kettle.  He would like to know if we can send correspondence to Dr. Cannon Kettle asking if she will prescribe an alternative.

## 2016-05-27 NOTE — Telephone Encounter (Signed)
Pt may speak directly with Doc treating the foot pain, I will send "flag msg to Doc BUT  Really , pot is his best advocate as far as management is concerned, pls let him know

## 2016-05-28 NOTE — Telephone Encounter (Signed)
Noted  

## 2016-05-29 DIAGNOSIS — H401131 Primary open-angle glaucoma, bilateral, mild stage: Secondary | ICD-10-CM | POA: Diagnosis not present

## 2016-05-29 LAB — HM DIABETES EYE EXAM

## 2016-06-01 ENCOUNTER — Encounter: Payer: Self-pay | Admitting: "Endocrinology

## 2016-06-01 ENCOUNTER — Encounter: Payer: Self-pay | Admitting: Vascular Surgery

## 2016-06-01 ENCOUNTER — Other Ambulatory Visit: Payer: Self-pay | Admitting: "Endocrinology

## 2016-06-04 ENCOUNTER — Telehealth: Payer: Self-pay

## 2016-06-04 NOTE — Telephone Encounter (Signed)
He will need to see pain management for uncontrolled chronic pain

## 2016-06-05 ENCOUNTER — Ambulatory Visit: Payer: PPO | Admitting: Vascular Surgery

## 2016-06-05 ENCOUNTER — Ambulatory Visit (HOSPITAL_COMMUNITY): Admission: RE | Admit: 2016-06-05 | Payer: PPO | Source: Ambulatory Visit

## 2016-06-08 ENCOUNTER — Other Ambulatory Visit: Payer: Self-pay | Admitting: Family Medicine

## 2016-06-08 NOTE — Telephone Encounter (Signed)
Called and left message for patient to return call.  

## 2016-06-09 NOTE — Telephone Encounter (Signed)
Patient would like to come in for ov.   Scheduled for 12/14

## 2016-06-11 ENCOUNTER — Encounter: Payer: Self-pay | Admitting: Family Medicine

## 2016-06-11 ENCOUNTER — Ambulatory Visit (INDEPENDENT_AMBULATORY_CARE_PROVIDER_SITE_OTHER): Payer: PPO | Admitting: Family Medicine

## 2016-06-11 VITALS — BP 112/74 | HR 91 | Resp 16 | Ht 72.0 in | Wt >= 6400 oz

## 2016-06-11 DIAGNOSIS — E669 Obesity, unspecified: Secondary | ICD-10-CM

## 2016-06-11 DIAGNOSIS — E1169 Type 2 diabetes mellitus with other specified complication: Secondary | ICD-10-CM

## 2016-06-11 DIAGNOSIS — K219 Gastro-esophageal reflux disease without esophagitis: Secondary | ICD-10-CM

## 2016-06-11 DIAGNOSIS — E11610 Type 2 diabetes mellitus with diabetic neuropathic arthropathy: Secondary | ICD-10-CM | POA: Diagnosis not present

## 2016-06-11 DIAGNOSIS — F341 Dysthymic disorder: Secondary | ICD-10-CM | POA: Diagnosis not present

## 2016-06-11 DIAGNOSIS — I10 Essential (primary) hypertension: Secondary | ICD-10-CM | POA: Diagnosis not present

## 2016-06-11 NOTE — Assessment & Plan Note (Signed)
Reports uncontrolled pain, after long discussion, pt agrees to continue to work on weight loss, good blood sugar control, and use of supportive footwear, which he is working closely with the Podiatrist to obtain.no medication change

## 2016-06-11 NOTE — Progress Notes (Signed)
CRAWFORD GUADAGNINO     MRN: [id number]      DOB: 27-Apr-1956   HPI Mr. Colyer is here for follow up and re-evaluation of chronic medical conditions, medication management and review of any available recent lab and radiology data.  Preventive health is updated, specifically  Cancer screening and Immunization.   Questions or concerns regarding consultations or procedures which the PT has had in the interim are  Addressed.Being followed closely by podiatry for management of Charcot foot left, wants to discuss his best pain options. General medical conditions are also reviewed Has successfully lost 21 pounds in past 4 months and feels much better  States that his walker has improved his mobility    ROS Denies recent fever or chills. Denies sinus pressure, nasal congestion, ear pain or sore throat. Denies chest congestion, productive cough or wheezing. Denies chest pains, palpitations and leg swelling Denies abdominal pain, nausea, vomiting,diarrhea or constipation.   Denies dysuria, frequency, hesitancy or incontinence.  Denies headaches, seizures, numbness, or tingling. Denies depression, anxiety or insomnia. Denies skin break down or rash.   PE  BP 112/74   Pulse 91   Resp 16   Ht 6' (1.829 m)   Wt (!) 421 lb (191 kg)   SpO2 94%   BMI 57.10 kg/m   Patient alert and oriented and in no cardiopulmonary distress.  HEENT: No facial asymmetry, EOMI,   oropharynx pink and moist.  Neck supple no JVD, no mass.  Chest: Clear to auscultation bilaterally.  CVS: S1, S2 no murmurs, no S3.Regular rate.  ABD: Soft non tender.   Ext: No edema  MS: decreased rOM spine , hips and knees  Skin: Intact, no ulcerations or rash noted.  Psych: Good eye contact, normal affect. Memory intact not anxious or depressed appearing.  CNS: CN 2-12 intact, power,  normal throughout.no focal deficits noted.   Assessment & Plan   Benign hypertension Controlled, no change in medication DASH  diet and commitment to daily physical activity for a minimum of 30 minutes discussed and encouraged, as a part of hypertension management. The importance of attaining a healthy weight is also discussed.  BP/Weight 06/11/2016 05/13/2016 05/05/2016 05/04/2016 04/14/2016 03/03/2016 [id number]  Systolic BP [id number] 0000000 - 0000000 [id number]  Diastolic BP 74 57 59 - 58 80 78  Wt. (Lbs) 421 413 - 435 438 442 440  BMI 57.1 56.01 - 57.39 59.4 59.95 59.67       Charcot foot due to diabetes mellitus ([place]) Reports uncontrolled pain, after long discussion, pt agrees to continue to work on weight loss, good blood sugar control, and use of supportive footwear, which he is working closely with the Podiatrist to obtain.no medication change   Diabetes mellitus type 2 in obese ([place]) Over corrected managed by endo, reports possible hypoglycemic episodes since there has been weight loss through dietary change.  DEPRESSION/ANXIETY Controlled, no change in medication   GERD (gastroesophageal reflux disease) Controlled, no change in medication   MORBID OBESITY improved Patient re-educated about  the importance of commitment to a  minimum of 150 minutes of exercise per week.  The importance of healthy food choices with portion control discussed. Encouraged to start a food diary, count calories and to consider  joining a support group. Sample diet sheets offered. Goals set by the patient for the next several months.   Weight /BMI 06/11/2016 05/13/2016 05/04/2016  WEIGHT 421 lb 413 lb 435 lb  HEIGHT 6\' 0"  6\' 0"  6\' 1"   BMI 57.1 kg/m2 56.01 kg/m2 57.39 kg/m2

## 2016-06-11 NOTE — Assessment & Plan Note (Signed)
Controlled, no change in medication  

## 2016-06-11 NOTE — Assessment & Plan Note (Signed)
Controlled, no change in medication DASH diet and commitment to daily physical activity for a minimum of 30 minutes discussed and encouraged, as a part of hypertension management. The importance of attaining a healthy weight is also discussed.  BP/Weight 06/11/2016 05/13/2016 05/05/2016 05/04/2016 04/14/2016 03/03/2016 [id number]  Systolic BP [id number] 0000000 - 0000000 [id number]  Diastolic BP 74 57 59 - 58 80 78  Wt. (Lbs) 421 413 - 435 438 442 440  BMI 57.1 56.01 - 57.39 59.4 59.95 59.67

## 2016-06-11 NOTE — Assessment & Plan Note (Signed)
improved Patient re-educated about  the importance of commitment to a  minimum of 150 minutes of exercise per week.  The importance of healthy food choices with portion control discussed. Encouraged to start a food diary, count calories and to consider  joining a support group. Sample diet sheets offered. Goals set by the patient for the next several months.   Weight /BMI 06/11/2016 05/13/2016 05/04/2016  WEIGHT 421 lb 413 lb 435 lb  HEIGHT 6\' 0"  6\' 0"  6\' 1"   BMI 57.1 kg/m2 56.01 kg/m2 57.39 kg/m2

## 2016-06-11 NOTE — Assessment & Plan Note (Signed)
Over corrected managed by endo, reports possible hypoglycemic episodes since there has been weight loss through dietary change.

## 2016-06-11 NOTE — Patient Instructions (Addendum)
F/u in 5 month, call if you need me sooner  [facility] visit week of April 23 or after CONGRATS on improved health habits and improved health  No changes in pain management, the special boot will help you ALOT!  All the best, looking for 25 pound weight loss!

## 2016-06-17 ENCOUNTER — Other Ambulatory Visit: Payer: Self-pay | Admitting: "Endocrinology

## 2016-06-17 ENCOUNTER — Other Ambulatory Visit: Payer: Self-pay | Admitting: Family Medicine

## 2016-06-18 ENCOUNTER — Other Ambulatory Visit: Payer: Self-pay

## 2016-06-18 MED ORDER — TRAMADOL HCL 50 MG PO TABS: ORAL_TABLET | ORAL | 1 refills | Status: DC

## 2016-06-26 NOTE — Telephone Encounter (Signed)
I've never recommended compression hose for this patient. Due to his habitus I think it will be difficult for him to use compression hose he will be a better candidate if his medical doctor grants clearance for lymphedema pumps  -Dr. Earl Gala

## 2016-06-30 ENCOUNTER — Encounter: Payer: Self-pay | Admitting: *Deleted

## 2016-07-05 ENCOUNTER — Telehealth: Payer: Self-pay | Admitting: Family Medicine

## 2016-07-05 NOTE — Telephone Encounter (Signed)
Pls type lett to Dr Cannon Kettle, granting medical clearance for pt  To proceed with treatment utilizing lymphedema pumps, as recommended , I will sign. Letter of request in your box, for reference , thanks  ??? pls ask

## 2016-07-06 ENCOUNTER — Other Ambulatory Visit: Payer: PPO

## 2016-07-06 NOTE — Telephone Encounter (Signed)
Letter typed

## 2016-07-08 ENCOUNTER — Other Ambulatory Visit: Payer: Self-pay | Admitting: Family Medicine

## 2016-07-20 ENCOUNTER — Other Ambulatory Visit: Payer: Self-pay | Admitting: Family Medicine

## 2016-07-22 DIAGNOSIS — E1165 Type 2 diabetes mellitus with hyperglycemia: Secondary | ICD-10-CM | POA: Diagnosis not present

## 2016-07-23 ENCOUNTER — Telehealth: Payer: Self-pay

## 2016-07-23 NOTE — Telephone Encounter (Signed)
No, since only one foot swollen , being treated by podiatry for this

## 2016-07-24 NOTE — Telephone Encounter (Signed)
Patient aware.

## 2016-07-27 ENCOUNTER — Encounter: Payer: Self-pay | Admitting: Vascular Surgery

## 2016-07-28 ENCOUNTER — Other Ambulatory Visit: Payer: Self-pay

## 2016-07-28 ENCOUNTER — Other Ambulatory Visit: Payer: Self-pay | Admitting: Family Medicine

## 2016-07-29 MED ORDER — PANTOPRAZOLE SODIUM 40 MG PO TBEC: DELAYED_RELEASE_TABLET | ORAL | 3 refills | Status: DC

## 2016-07-31 ENCOUNTER — Ambulatory Visit (INDEPENDENT_AMBULATORY_CARE_PROVIDER_SITE_OTHER): Payer: PPO | Admitting: Vascular Surgery

## 2016-07-31 ENCOUNTER — Encounter: Payer: Self-pay | Admitting: Vascular Surgery

## 2016-07-31 ENCOUNTER — Ambulatory Visit (HOSPITAL_COMMUNITY)
Admission: RE | Admit: 2016-07-31 | Discharge: 2016-07-31 | Disposition: A | Discharge status: Home / Self Care | Payer: PPO | Source: Ambulatory Visit | Attending: Vascular Surgery | Admitting: Vascular Surgery

## 2016-07-31 VITALS — BP 108/66 | HR 76 | Temp 97.1°F | Resp 20 | Ht 73.0 in | Wt >= 6400 oz

## 2016-07-31 DIAGNOSIS — I8393 Asymptomatic varicose veins of bilateral lower extremities: Secondary | ICD-10-CM | POA: Insufficient documentation

## 2016-07-31 DIAGNOSIS — I872 Venous insufficiency (chronic) (peripheral): Secondary | ICD-10-CM | POA: Diagnosis not present

## 2016-07-31 DIAGNOSIS — R600 Localized edema: Secondary | ICD-10-CM | POA: Diagnosis not present

## 2016-07-31 NOTE — Progress Notes (Signed)
Patient ID: Nicholas [name], male   DOB: Jul 13, 1955, 61 y.o.   MRN: [id number]  Reason for Consult: Re-evaluation (venous insufficiency)   Referred by Fayrene Helper, MD  Subjective:     HPI:  Nicholas [name] is a 61 y.o. male obese male previously referred to me for bilateral lower extremity discoloration and swelling he had arterial studies at that time which demonstrated normal arterial flow with ABIs greater than 1 bilaterally noncompressible given his diabetes. At that time we set him up with wound care Profore dressings which he got for 6 weeks. He does not have any ulceration of his legs has continued discoloration. States that his swelling is much better. He has been set up with compression stockings that he will be able to get on at this time. He has lost weight probably around 30 pounds states. He is interested in getting back to activity. He does not have any ulceration of his legs. He is putting lotion on his legs does not have somewhat discouraged this given the buildup he has. His followed by [facility] and has a visit with them soon.  Past Medical History:  Diagnosis Date  . Chronic knee pain   . Depression   . Diabetes ([place])   . Glaucoma   . Hyperlipidemia   . Hypertension   . MRSA (methicillin resistant Staphylococcus aureus)   . Poor circulation    leg   . Suicide attempt   . Testosterone deficiency    Family History  Problem Relation Age of Onset  . Diabetes Mother   . Leukemia Mother   . Cancer Father     colon, age greater than 79?  Marland Kitchen Heart disease Father   . Glaucoma Father   . Parkinsonism Father     altzheimers , brain hemorrhage  . Colon cancer Neg Hx    Past Surgical History:  Procedure Laterality Date  . CHOLECYSTECTOMY N/A 02/25/2015   Procedure: LAPAROSCOPIC CHOLECYSTECTOMY;  Surgeon: Aviva Signs, MD;  Location: [facility];  Service: General;  Laterality: N/A;  . COLONOSCOPY  03/2008   [id number] external hemorrhoidal tag,  otherwise normal rectum/Two diminutive rectosigmoid polyps s/p bx/ Polyp in the opposite the ileocecal valve s/p bx. tubular adenoma.  . COLONOSCOPY N/A 03/23/2014   [id number] colon polyps throughout his rectum and colon. Sessile and pedunculated. The largest polyp was proximally 6 mm in dimensions in the retum at 3 cm with adjacent diminutive polyp.  There was also a polyps on ileocecal valve, ascending segmen descending and sigmoid segment. as the colon was tortuous & elongated requiring externa abd pressure and changing of the pts postion to reach cecum  . ESOPHAGOGASTRODUODENOSCOPY N/A 03/23/2014   RMR: 1. Patient had circumferential distal esophageal erosions within 5 mm of the GE junction.  No Barrett's esophagus. Tubular esophagus patent throughout its course. 2.  Diffuse gastric erosions. (1) 71mm area of healing ulceration in the antrum. No ulcer or infiltrating process observed. Patent pylorus. Normal-appearing first and second portion of the duodenum .  . kidney stones     cysto with litholapexy    Short Social History:  Social History  Substance Use Topics  . Smoking status: Former Smoker    Packs/day: 1.50    Years: 25.00    Types: Cigarettes    Quit date: 02/20/2002  . Smokeless tobacco: Former Systems developer     Comment: Quit x 13 years  . Alcohol use No    Allergies  Allergen Reactions  . Dilaudid [  Hydromorphone Hcl] Shortness Of Breath    Current Outpatient Prescriptions  Medication Sig Dispense Refill  . ANDROGEL PUMP 20.25 MG/ACT (1.62%) GEL     . aspirin EC 81 MG tablet Take 81 mg by mouth daily.    . brimonidine (ALPHAGAN P) 0.1 % SOLN Place 1 drop into both eyes 2 (two) times daily. One drop in each eye two times a day     . brimonidine-timolol (COMBIGAN) 0.2-0.5 % ophthalmic solution Place 1 drop into both eyes every 12 (twelve) hours. One drop in each eye two times a day      . citalopram (CELEXA) 40 MG tablet TAKE 1 TABLET BY MOUTH ONCE A DAY. 90 tablet 1  .  clotrimazole-betamethasone (LOTRISONE) cream APPLY TO AFFECTED AREAS TWICE DAILY. (Patient taking differently: Apply 1 application topically daily as needed (for skin irritation). ) 45 g 0  . furosemide (LASIX) 20 MG tablet Take 1 tablet (20 mg total) by mouth daily. 30 tablet 1  . gabapentin (NEURONTIN) 600 MG tablet TAKE ONE TABLET BY MOUTH 3 TIMES DAILY. 270 tablet 1  . HUMULIN R U-500 KWIKPEN 500 UNIT/ML kwikpen INJECT UP TO 100 UNITS SUBCUTANEOUSLY 30 MINUTES BEFORE MEALS. 18 mL 1  . Insulin Regular Human (HUMULIN R U-500 KWIKPEN [place]) Inject 50 Units into the skin 3 (three) times daily with meals.    Marland Kitchen ketoconazole (NIZORAL) 2 % shampoo     . lamoTRIgine (LAMICTAL) 200 MG tablet TAKE 1 TABLET BY MOUTH ONCE A DAY. 30 tablet 3  . lisinopril (PRINIVIL,ZESTRIL) 5 MG tablet TAKE 1 TABLET BY MOUTH ONCE A DAY. 30 tablet 3  . metoprolol succinate (TOPROL-XL) 25 MG 24 hr tablet TAKE ONE TABLET BY MOUTH ONCE DAILY. 30 tablet 3  . naproxen (NAPROSYN) 500 MG tablet     . NOVOFINE 30G X 8 MM MISC     . simvastatin (ZOCOR) 40 MG tablet TAKE 1 TABLET BY MOUTH ONCE A DAY. 90 tablet 1  . Testosterone (ANDROGEL PUMP TD) Place 75 g onto the skin every morning.    . traMADol (ULTRAM) 50 MG tablet TAKE 2 TABLETS BY MOUTH EVERY 8 HOURS AS NEEDED. 180 tablet 1  . TRAVATAN Z 0.004 % SOLN ophthalmic solution Place 1 drop into both eyes at bedtime.     . triamcinolone ointment (KENALOG) 0.1 % Apply 1 application topically 2 (two) times daily. (Patient taking differently: Apply 1 application topically daily as needed (for skin irritation). ) 454 g 0  . VICTOZA 18 MG/3ML SOPN INJECT 1.8 MG SUBCUTANEOUSLY ONCE DAILY. 9 mL 2  . Vitamin D, Ergocalciferol, (DRISDOL) 50000 units CAPS capsule TAKE ONE CAPSULE BY MOUTH ONCE WEEKLY. 12 capsule 0  . ondansetron (ZOFRAN) 8 MG tablet Take 1 tablet (8 mg total) by mouth every 8 (eight) hours as needed for nausea or vomiting. (Patient not taking: Reported on 07/31/2016) 20 tablet 0  .  pantoprazole (PROTONIX) 40 MG tablet TAKE (1) TABLET BY MOUTH TWICE DAILY. (Patient not taking: Reported on 07/31/2016) 60 tablet 3   No current facility-administered medications for this visit.     Review of Systems  Constitutional:  Constitutional negative. Eyes: Eyes negative.  Cardiovascular: Positive for leg swelling.  GI: Gastrointestinal negative.  Musculoskeletal: Positive for back pain and leg pain.  Skin: Positive for wound.  Neurological: Neurological negative. Hematologic: Hematologic/lymphatic negative.  Psychiatric: Psychiatric negative.        Objective:  Objective   Vitals:   07/31/16 1527  BP: 108/66  Pulse:  76  Resp: 20  Temp: 97.1 F (36.2 C)  Weight: (!) 413 lb (187.3 kg)  Height: 6\' 1"  (1.854 m)   Body mass index is 54.49 kg/m.  Physical Exam  Constitutional: He is oriented to person, place, and time.  obese  HENT:  Head: Normocephalic.  Eyes: Pupils are equal, round, and reactive to light.  Neck: Normal range of motion.  Cardiovascular:  Pulses:      Dorsalis pedis pulses are 2+ on the right side, and 2+ on the left side.       Posterior tibial pulses are 2+ on the right side, and 2+ on the left side.  Pulmonary/Chest: Effort normal.  Abdominal: Soft.  Musculoskeletal:  No edema today Heaped lotion on ble Discoloration from level of below knee down  Neurological: He is alert and oriented to person, place, and time.  Skin: Skin is warm.  Psychiatric: He has a normal mood and affect. His behavior is normal. Judgment and thought content normal.    Data: No evidence of deep or superficial vein thrombosis. He does have venous incompetence noted in his bilateral saphenofemoral junctions common femoral veins. There is unilateral venous incompetence in the right femoral vein and right greater saphenous vein distally.     Assessment/Plan:     61 year old white male history of bilateral lower extremity ulceration without DVT. He has normal  arterial waveforms with palpable pulses. He does have evidence of venous reflux in his right greater saphenous vein that is distal in the leg. I counseled him that I do not think ablating his veins will be beneficial for him at this time. He has lost 25 pounds since his last visit left congratulated him on this. He is about to get fitted thigh-high compression stockings and he is dedicated wearing these. I told him that with ambulation and compression stockings he will hopefully avoid needing intervention on his legs and can avoid the wound Center as well. He will continue his follow-up with podiatry for care of his feet. He can follow-up here when necessary.     Waynetta Sandy MD [facility]

## 2016-08-03 ENCOUNTER — Other Ambulatory Visit: Payer: PPO

## 2016-08-05 ENCOUNTER — Other Ambulatory Visit: Payer: Self-pay | Admitting: Family Medicine

## 2016-08-11 ENCOUNTER — Other Ambulatory Visit: Payer: Self-pay | Admitting: "Endocrinology

## 2016-08-11 DIAGNOSIS — E782 Mixed hyperlipidemia: Secondary | ICD-10-CM | POA: Diagnosis not present

## 2016-08-11 DIAGNOSIS — E669 Obesity, unspecified: Secondary | ICD-10-CM | POA: Diagnosis not present

## 2016-08-11 DIAGNOSIS — E1169 Type 2 diabetes mellitus with other specified complication: Secondary | ICD-10-CM | POA: Diagnosis not present

## 2016-08-11 LAB — TSH: TSH: 3.53 mIU/L (ref 0.40–4.50)

## 2016-08-11 LAB — COMPREHENSIVE METABOLIC PANEL
ALK PHOS: 69 U/L (ref 40–115)
ALT: 13 U/L (ref 9–46)
AST: 15 U/L (ref 10–35)
Albumin: 3.9 g/dL (ref 3.6–5.1)
BUN: 12 mg/dL (ref 7–25)
CO2: 30 mmol/L (ref 20–31)
Calcium: 8.9 mg/dL (ref 8.6–10.3)
Chloride: 104 mmol/L (ref 98–110)
Creat: 1.17 mg/dL (ref 0.70–1.25)
Glucose, Bld: 123 mg/dL — ABNORMAL HIGH (ref 65–99)
POTASSIUM: 4.8 mmol/L (ref 3.5–5.3)
Sodium: 144 mmol/L (ref 135–146)
TOTAL PROTEIN: 6.1 g/dL (ref 6.1–8.1)
Total Bilirubin: 0.5 mg/dL (ref 0.2–1.2)

## 2016-08-11 LAB — LIPID PANEL
CHOL/HDL RATIO: 3.1 ratio (ref <5.0)
CHOLESTEROL: 159 mg/dL (ref <200)
HDL: 52 mg/dL (ref >40)
LDL Cholesterol: 72 mg/dL (ref <100)
Triglycerides: 175 mg/dL — ABNORMAL HIGH (ref <150)
VLDL: 35 mg/dL — ABNORMAL HIGH (ref <30)

## 2016-08-11 LAB — T4, FREE: FREE T4: 0.8 ng/dL (ref 0.8–1.8)

## 2016-08-12 LAB — HEMOGLOBIN A1C
Hgb A1c MFr Bld: 6.3 % — ABNORMAL HIGH (ref <5.7)
MEAN PLASMA GLUCOSE: 134 mg/dL

## 2016-08-18 ENCOUNTER — Other Ambulatory Visit: Payer: Self-pay | Admitting: "Endocrinology

## 2016-08-19 ENCOUNTER — Ambulatory Visit (INDEPENDENT_AMBULATORY_CARE_PROVIDER_SITE_OTHER): Payer: PPO | Admitting: "Endocrinology

## 2016-08-19 ENCOUNTER — Encounter: Payer: Self-pay | Admitting: "Endocrinology

## 2016-08-19 VITALS — BP 136/72 | HR 85 | Ht 73.0 in | Wt >= 6400 oz

## 2016-08-19 DIAGNOSIS — E669 Obesity, unspecified: Secondary | ICD-10-CM

## 2016-08-19 DIAGNOSIS — I10 Essential (primary) hypertension: Secondary | ICD-10-CM

## 2016-08-19 DIAGNOSIS — E782 Mixed hyperlipidemia: Secondary | ICD-10-CM | POA: Diagnosis not present

## 2016-08-19 DIAGNOSIS — E039 Hypothyroidism, unspecified: Secondary | ICD-10-CM | POA: Diagnosis not present

## 2016-08-19 DIAGNOSIS — E1169 Type 2 diabetes mellitus with other specified complication: Secondary | ICD-10-CM

## 2016-08-19 MED ORDER — LEVOTHYROXINE SODIUM 50 MCG PO CAPS: 50.0000 ug | ORAL_CAPSULE | Freq: Every day | ORAL | 3 refills | Status: DC

## 2016-08-19 NOTE — Patient Instructions (Signed)

## 2016-08-19 NOTE — Progress Notes (Signed)
Subjective:    Patient ID: Nicholas [name], male    DOB: 02/26/1956,    Past Medical History:  Diagnosis Date  . Chronic knee pain   . Depression   . Diabetes ([place])   . Glaucoma   . Hyperlipidemia   . Hypertension   . MRSA (methicillin resistant Staphylococcus aureus)   . Poor circulation    leg   . Suicide attempt   . Testosterone deficiency    Past Surgical History:  Procedure Laterality Date  . CHOLECYSTECTOMY N/A 02/25/2015   Procedure: LAPAROSCOPIC CHOLECYSTECTOMY;  Surgeon: Aviva Signs, MD;  Location: [facility];  Service: General;  Laterality: N/A;  . COLONOSCOPY  03/2008   [id number] external hemorrhoidal tag, otherwise normal rectum/Two diminutive rectosigmoid polyps s/p bx/ Polyp in the opposite the ileocecal valve s/p bx. tubular adenoma.  . COLONOSCOPY N/A 03/23/2014   [id number] colon polyps throughout his rectum and colon. Sessile and pedunculated. The largest polyp was proximally 6 mm in dimensions in the retum at 3 cm with adjacent diminutive polyp.  There was also a polyps on ileocecal valve, ascending segmen descending and sigmoid segment. as the colon was tortuous & elongated requiring externa abd pressure and changing of the pts postion to reach cecum  . ESOPHAGOGASTRODUODENOSCOPY N/A 03/23/2014   RMR: 1. Patient had circumferential distal esophageal erosions within 5 mm of the GE junction.  No Barrett's esophagus. Tubular esophagus patent throughout its course. 2.  Diffuse gastric erosions. (1) 35mm area of healing ulceration in the antrum. No ulcer or infiltrating process observed. Patent pylorus. Normal-appearing first and second portion of the duodenum .  . kidney stones     cysto with litholapexy   Social History   Social History  . Marital status: Divorced    Spouse name: N/A  . Number of children: 0  . Years of education: 12   Occupational History  . DISABLED   .  Unemployed   Social History Main Topics  . Smoking status: Former Smoker   Packs/day: 1.50    Years: 25.00    Types: Cigarettes    Quit date: 02/20/2002  . Smokeless tobacco: Former Systems developer     Comment: Quit x 13 years  . Alcohol use No  . Drug use: No  . Sexual activity: Not Currently    Birth control/ protection: None   Other Topics Concern  . None   Social History Narrative  . None   Outpatient Encounter Prescriptions as of 08/19/2016  Medication Sig  . ANDROGEL PUMP 20.25 MG/ACT (1.62%) GEL   . aspirin EC 81 MG tablet Take 81 mg by mouth daily.  . brimonidine (ALPHAGAN P) 0.1 % SOLN Place 1 drop into both eyes 2 (two) times daily. One drop in each eye two times a day   . brimonidine-timolol (COMBIGAN) 0.2-0.5 % ophthalmic solution Place 1 drop into both eyes every 12 (twelve) hours. One drop in each eye two times a day    . citalopram (CELEXA) 40 MG tablet TAKE 1 TABLET BY MOUTH ONCE A DAY.  . clotrimazole-betamethasone (LOTRISONE) cream APPLY TO AFFECTED AREAS TWICE DAILY. (Patient taking differently: Apply 1 application topically daily as needed (for skin irritation). )  . furosemide (LASIX) 20 MG tablet Take 1 tablet (20 mg total) by mouth daily.  Marland Kitchen gabapentin (NEURONTIN) 600 MG tablet TAKE ONE TABLET BY MOUTH 3 TIMES DAILY.  Marland Kitchen HUMULIN R U-500 KWIKPEN 500 UNIT/ML kwikpen INJECT UP TO 100 UNITS SUBCUTANEOUSLY 30 MINUTES BEFORE MEALS.  Marland Kitchen  Insulin Regular Human (HUMULIN R U-500 KWIKPEN [place]) Inject 50 Units into the skin 3 (three) times daily with meals.  Marland Kitchen ketoconazole (NIZORAL) 2 % shampoo   . lamoTRIgine (LAMICTAL) 200 MG tablet TAKE 1 TABLET BY MOUTH ONCE A DAY.  Marland Kitchen Levothyroxine Sodium 50 MCG CAPS Take 1 capsule (50 mcg total) by mouth daily before breakfast.  . lisinopril (PRINIVIL,ZESTRIL) 5 MG tablet TAKE 1 TABLET BY MOUTH ONCE A DAY.  . metoprolol succinate (TOPROL-XL) 25 MG 24 hr tablet TAKE ONE TABLET BY MOUTH ONCE DAILY.  . naproxen (NAPROSYN) 500 MG tablet TAKE ONE TABLET BY MOUTH TWICE DAILY WITH A MEAL.  Marland Kitchen NOVOFINE 30G X 8 MM MISC   .  simvastatin (ZOCOR) 40 MG tablet TAKE 1 TABLET BY MOUTH ONCE A DAY.  Marland Kitchen Testosterone (ANDROGEL PUMP TD) Place 75 g onto the skin every morning.  . traMADol (ULTRAM) 50 MG tablet TAKE 2 TABLETS BY MOUTH EVERY 8 HOURS AS NEEDED.  Marland Kitchen TRAVATAN Z 0.004 % SOLN ophthalmic solution Place 1 drop into both eyes at bedtime.   . triamcinolone ointment (KENALOG) 0.1 % Apply 1 application topically 2 (two) times daily. (Patient taking differently: Apply 1 application topically daily as needed (for skin irritation). )  . VICTOZA 18 MG/3ML SOPN INJECT 1.8 MG SUBCUTANEOUSLY ONCE DAILY.  Marland Kitchen Vitamin D, Ergocalciferol, (DRISDOL) 50000 units CAPS capsule TAKE ONE CAPSULE BY MOUTH ONCE WEEKLY.  . [DISCONTINUED] ondansetron (ZOFRAN) 8 MG tablet Take 1 tablet (8 mg total) by mouth every 8 (eight) hours as needed for nausea or vomiting. (Patient not taking: Reported on 07/31/2016)  . [DISCONTINUED] pantoprazole (PROTONIX) 40 MG tablet TAKE (1) TABLET BY MOUTH TWICE DAILY. (Patient not taking: Reported on 07/31/2016)   No facility-administered encounter medications on file as of 08/19/2016.    ALLERGIES: Allergies  Allergen Reactions  . Dilaudid [Hydromorphone Hcl] Shortness Of Breath   VACCINATION STATUS: Immunization History  Administered Date(s) Administered  . Influenza Whole 04/01/2010  . Influenza,inj,Quad PF,36+ Mos 03/08/2013  . Pneumococcal Conjugate-13 07/09/2014  . Pneumococcal Polysaccharide-23 04/01/2010, 10/07/2015  . Tdap 11/24/2011  . Zoster 12/12/2014    Diabetes  He presents for his follow-up diabetic visit. He has type 2 diabetes mellitus. His disease course has been stable. There are no hypoglycemic associated symptoms. Pertinent negatives for hypoglycemia include no confusion, headaches, pallor or seizures. There are no diabetic associated symptoms. Pertinent negatives for diabetes include no chest pain, no fatigue, no polydipsia, no polyphagia, no polyuria and no weakness. There are no hypoglycemic  complications. Symptoms are stable. There are no diabetic complications. He is compliant with treatment most of the time. His weight is decreasing steadily. He is following a generally unhealthy diet. He rarely participates in exercise. His home blood glucose trend is decreasing steadily. His breakfast blood glucose range is generally 130-140 mg/dl. His lunch blood glucose range is generally 130-140 mg/dl. His dinner blood glucose range is generally 110-130 mg/dl. His overall blood glucose range is 130-140 mg/dl. An ACE inhibitor/angiotensin II receptor blocker is being taken.  Hyperlipidemia  This is a chronic problem. The current episode started more than 1 year ago. Exacerbating diseases include diabetes and obesity. Pertinent negatives include no chest pain, myalgias or shortness of breath. Current antihyperlipidemic treatment includes statins. Risk factors for coronary artery disease include a sedentary lifestyle, male sex, diabetes mellitus, dyslipidemia and obesity.     Review of Systems  Constitutional: Negative for fatigue and unexpected weight change.  HENT: Negative for dental problem, mouth sores  and trouble swallowing.   Eyes: Negative for visual disturbance.  Respiratory: Negative for cough, choking, chest tightness, shortness of breath and wheezing.   Cardiovascular: Negative for chest pain, palpitations and leg swelling.  Gastrointestinal: Negative for abdominal distention, abdominal pain, constipation, diarrhea, nausea and vomiting.  Endocrine: Negative for polydipsia, polyphagia and polyuria.  Genitourinary: Negative for dysuria, flank pain, hematuria and urgency.  Musculoskeletal: Negative for back pain, gait problem, myalgias and neck pain.  Skin: Negative for pallor, rash and wound.  Neurological: Negative for seizures, syncope, weakness, numbness and headaches.  Psychiatric/Behavioral: Negative.  Negative for confusion and dysphoric mood.    Objective:    BP 136/72    Pulse 85   Ht 6\' 1"  (1.854 m)   Wt (!) 418 lb (189.6 kg)   BMI 55.15 kg/m   Wt Readings from Last 3 Encounters:  08/19/16 (!) 418 lb (189.6 kg)  07/31/16 (!) 413 lb (187.3 kg)  06/11/16 (!) 421 lb (191 kg)    Physical Exam  Constitutional: He is oriented to person, place, and time. He appears well-developed and well-nourished. He is cooperative. No distress.  HENT:  Head: Normocephalic and atraumatic.  Eyes: EOM are normal.  Neck: Normal range of motion. Neck supple. No tracheal deviation present. No thyromegaly present.  Cardiovascular: Normal rate, S1 normal, S2 normal and normal heart sounds.  Exam reveals no gallop.   No murmur heard. Pulses:      Dorsalis pedis pulses are 1+ on the right side, and 1+ on the left side.       Posterior tibial pulses are 1+ on the right side, and 1+ on the left side.  Pulmonary/Chest: Breath sounds normal. No respiratory distress. He has no wheezes.  Abdominal: Soft. Bowel sounds are normal. He exhibits no distension. There is no tenderness. There is no guarding and no CVA tenderness.  Musculoskeletal: He exhibits no edema.       Right shoulder: He exhibits no swelling and no deformity.  Large lower extremities with skin dryness and lateral eczema on the left lower leg.  Neurological: He is alert and oriented to person, place, and time. He has normal strength and normal reflexes. No cranial nerve deficit or sensory deficit. Gait normal.  Skin: Skin is warm and dry. No rash noted. No cyanosis. Nails show no clubbing.  He has extensive tattoos.  Psychiatric: He has a normal mood and affect. His speech is normal and behavior is normal. Judgment and thought content normal. Cognition and memory are normal.    Results for orders placed or performed in visit on 08/11/16  Comprehensive metabolic panel  Result Value Ref Range   Sodium 144 135 - 146 mmol/L   Potassium 4.8 3.5 - 5.3 mmol/L   Chloride 104 98 - 110 mmol/L   CO2 30 20 - 31 mmol/L    Glucose, Bld 123 (H) 65 - 99 mg/dL   BUN 12 7 - 25 mg/dL   Creat 1.17 0.70 - 1.25 mg/dL   Total Bilirubin 0.5 0.2 - 1.2 mg/dL   Alkaline Phosphatase 69 40 - 115 U/L   AST 15 10 - 35 U/L   ALT 13 9 - 46 U/L   Total Protein 6.1 6.1 - 8.1 g/dL   Albumin 3.9 3.6 - 5.1 g/dL   Calcium 8.9 8.6 - 10.3 mg/dL  Lipid panel  Result Value Ref Range   Cholesterol 159 <200 mg/dL   Triglycerides 175 (H) <150 mg/dL   HDL 52 >40 mg/dL  Total CHOL/HDL Ratio 3.1 <5.0 Ratio   VLDL 35 (H) <30 mg/dL   LDL Cholesterol 72 <100 mg/dL  TSH  Result Value Ref Range   TSH 3.53 0.40 - 4.50 mIU/L  T4, free  Result Value Ref Range   Free T4 0.8 0.8 - 1.8 ng/dL  Hemoglobin A1c  Result Value Ref Range   Hgb A1c MFr Bld 6.3 (H) <5.7 %   Mean Plasma Glucose 134 mg/dL  Diabetic Labs (most recent): Lab Results  Component Value Date   HGBA1C 6.3 (H) 08/11/2016   HGBA1C 5.8 (H) 05/06/2016   HGBA1C 6.5 (H) 01/03/2016   Lipid Panel     Component Value Date/Time   CHOL 159 08/11/2016 0748   TRIG 175 (H) 08/11/2016 0748   HDL 52 08/11/2016 0748   CHOLHDL 3.1 08/11/2016 0748   VLDL 35 (H) 08/11/2016 0748   LDLCALC 72 08/11/2016 0748    Assessment & Plan:   1. 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  Patient came with Improved glycemic profile on U500 insulin taking 80 units 3 times a day before meals. His recent A1c of  6.3% generally improving from 9 %.   -He did  Not have hypoglycemia , did have some rare random readings in the 60s.  - Patient remains at a high risk for more acute and chronic complications of diabetes which include CAD, CVA, CKD, retinopathy, and neuropathy. These are all discussed in detail with the patient.  - I have re-counseled the patient on diet management and weight loss  by adopting a carbohydrate restricted / protein rich  Diet. - Patient is advised to stick to a routine mealtimes to eat 3  meals  a day and avoid unnecessary snacks ( to snack only to correct hypoglycemia).  - Suggestion is made for patient to avoid simple carbohydrates   from their diet including Cakes , Desserts, Ice Cream,  Soda (  diet and regular) , Sweet Tea , Candies,  Chips, Cookies, Artificial Sweeteners,   and "Sugar-free" Products .  This will help patient to have stable blood glucose profile and potentially avoid unintended  Weight gain.  - The patient  has been  scheduled with Jearld Fenton, RDN, CDE for individualized DM education. - I have approached patient with the following individualized plan to manage diabetes and patient agrees.  - I will continue Humulin U500   50 units 3 times a day before meals for pre-meal blood glucose above 90 mg/dL associated with strict monitoring of glucose  AC and HS. - Patient is warned not to take insulin without proper monitoring per orders.  -Patient is encouraged to call clinic for blood glucose levels less than 70 or above 300 mg /dl. - I will continue Victoza 1.8 mg subcutaneous daily, therapeutically suitable for patient.. -Patient does not tolerate metformin.   - Patient specific target  for A1c; LDL, HDL, Triglycerides, and  Waist Circumference were discussed in detail.  2) BP/HTN: Controlled. Continue current medications including ACEI. 3) Lipids/HPL:  continue statins. 4)  Weight/Diet: CDE consult has been initiated, exercise, and carbohydrates information provided. He recently lost 37 pounds mainly due to gastroenteritis.  5) hypothyroidism: New diagnosis for him. He would benefit from initiation of low-dose levothyroxine. I discussed and initiated levothyroxine 50 g by mouth daily before breakfast.  - We discussed about correct intake of levothyroxine, at fasting, with water, separated by at  least 30 minutes from breakfast, and separated by more than 4 hours from calcium, iron, multivitamins, acid reflux medications (PPIs). -Patient is made aware of  the fact that thyroid hormone replacement is needed for life, dose to be adjusted by periodic monitoring of thyroid function tests.   6) Chronic Care/Health Maintenance:  -Patient is on ACEI and Statin medications and encouraged to continue to follow up with Ophthalmology, Podiatrist at least yearly or according to recommendations, and advised to  stay away from smoking. I have recommended yearly flu vaccine and pneumonia vaccination at least every 5 years; moderate intensity exercise for up to 150 minutes weekly; and  sleep for at least 7 hours a day. I refilled his Kenalog ointment given to him for bilateral lower extremity dryness and eczema  .  I advised patient to maintain close follow up with their PCP for primary care needs.  Patient is asked to bring meter and  blood glucose logs during their next visit.   Follow up plan: Return in about 3 months (around 11/16/2016) for follow up with pre-visit labs, meter, and logs.  Glade Lloyd, MD Phone: [phone]  Fax: [phone]   08/19/2016, 11:13 AM

## 2016-08-25 ENCOUNTER — Other Ambulatory Visit: Payer: Self-pay

## 2016-08-25 MED ORDER — LISINOPRIL 5 MG PO TABS: 5.0000 mg | ORAL_TABLET | Freq: Every day | ORAL | 3 refills | Status: DC

## 2016-08-31 ENCOUNTER — Other Ambulatory Visit: Payer: PPO

## 2016-08-31 ENCOUNTER — Other Ambulatory Visit: Payer: Self-pay | Admitting: Family Medicine

## 2016-08-31 DIAGNOSIS — E11610 Type 2 diabetes mellitus with diabetic neuropathic arthropathy: Secondary | ICD-10-CM | POA: Diagnosis not present

## 2016-09-08 DIAGNOSIS — T1490XA Injury, unspecified, initial encounter: Secondary | ICD-10-CM | POA: Diagnosis not present

## 2016-09-10 ENCOUNTER — Emergency Department (HOSPITAL_COMMUNITY)
Admission: EM | Admit: 2016-09-10 | Discharge: 2016-09-10 | Disposition: A | Discharge status: Home / Self Care | Payer: PPO | Attending: Emergency Medicine | Admitting: Emergency Medicine

## 2016-09-10 ENCOUNTER — Encounter (HOSPITAL_COMMUNITY): Payer: Self-pay | Admitting: *Deleted

## 2016-09-10 ENCOUNTER — Emergency Department (HOSPITAL_COMMUNITY): Payer: PPO

## 2016-09-10 DIAGNOSIS — Y999 Unspecified external cause status: Secondary | ICD-10-CM | POA: Diagnosis not present

## 2016-09-10 DIAGNOSIS — Z7982 Long term (current) use of aspirin: Secondary | ICD-10-CM | POA: Diagnosis not present

## 2016-09-10 DIAGNOSIS — Z87891 Personal history of nicotine dependence: Secondary | ICD-10-CM | POA: Diagnosis not present

## 2016-09-10 DIAGNOSIS — S20211A Contusion of right front wall of thorax, initial encounter: Secondary | ICD-10-CM

## 2016-09-10 DIAGNOSIS — W19XXXA Unspecified fall, initial encounter: Secondary | ICD-10-CM

## 2016-09-10 DIAGNOSIS — I10 Essential (primary) hypertension: Secondary | ICD-10-CM | POA: Diagnosis not present

## 2016-09-10 DIAGNOSIS — W010XXA Fall on same level from slipping, tripping and stumbling without subsequent striking against object, initial encounter: Secondary | ICD-10-CM | POA: Diagnosis not present

## 2016-09-10 DIAGNOSIS — Z794 Long term (current) use of insulin: Secondary | ICD-10-CM | POA: Insufficient documentation

## 2016-09-10 DIAGNOSIS — Z79899 Other long term (current) drug therapy: Secondary | ICD-10-CM | POA: Diagnosis not present

## 2016-09-10 DIAGNOSIS — R0781 Pleurodynia: Secondary | ICD-10-CM | POA: Diagnosis not present

## 2016-09-10 DIAGNOSIS — Y939 Activity, unspecified: Secondary | ICD-10-CM | POA: Diagnosis not present

## 2016-09-10 DIAGNOSIS — Y929 Unspecified place or not applicable: Secondary | ICD-10-CM | POA: Diagnosis not present

## 2016-09-10 DIAGNOSIS — S299XXA Unspecified injury of thorax, initial encounter: Secondary | ICD-10-CM | POA: Diagnosis not present

## 2016-09-10 DIAGNOSIS — E119 Type 2 diabetes mellitus without complications: Secondary | ICD-10-CM | POA: Insufficient documentation

## 2016-09-10 MED ORDER — HYDROCODONE-ACETAMINOPHEN 5-325 MG PO TABS
2.0000 | ORAL_TABLET | Freq: Once | ORAL | Status: AC
  Administered 2016-09-10: 2 via ORAL
  Filled 2016-09-10: qty 2

## 2016-09-10 MED ORDER — IBUPROFEN 600 MG PO TABS: 600.0000 mg | ORAL_TABLET | Freq: Four times a day (QID) | ORAL | 0 refills | Status: DC | PRN

## 2016-09-10 MED ORDER — ACETAMINOPHEN-CODEINE #3 300-30 MG PO TABS: 1.0000 | ORAL_TABLET | Freq: Four times a day (QID) | ORAL | 0 refills | Status: DC | PRN

## 2016-09-10 MED ORDER — METHOCARBAMOL 500 MG PO TABS
1000.0000 mg | ORAL_TABLET | Freq: Once | ORAL | Status: AC
  Administered 2016-09-10: 1000 mg via ORAL
  Filled 2016-09-10: qty 2

## 2016-09-10 MED ORDER — CYCLOBENZAPRINE HCL 10 MG PO TABS: 10.0000 mg | ORAL_TABLET | Freq: Three times a day (TID) | ORAL | 0 refills | Status: DC

## 2016-09-10 MED ORDER — BACITRACIN-NEOMYCIN-POLYMYXIN 400-5-5000 EX OINT
TOPICAL_OINTMENT | Freq: Once | CUTANEOUS | Status: AC
  Administered 2016-09-10: 1 via TOPICAL
  Filled 2016-09-10: qty 1

## 2016-09-10 NOTE — Discharge Instructions (Signed)
Your vital signs within normal limits. Your x-ray is negative for fracture or dislocation concerning your ribs. No evidence of any lung abnormality appreciated. Please use the incentive spirometer every 30 minutes until you're contusion has healed. Please use Flexeril 3 times daily and Motrin 4 times daily. Take the Motrin with breakfast, lunch, dinner, and at bedtime. Use Tylenol codeine for more severe pain. This medication may cause drowsiness, please do not drive, operate machinery, or participate in activities requiring concentration when taking this medication. Please see Dr. Moshe Cipro for additional evaluation and management if not improving.

## 2016-09-10 NOTE — ED Notes (Signed)
Pt c/o right rib pain, generalized lower back pain, bilateral buttock pain and abrasion to lle from fall on Tuesday. Pt also reports hitting head, denies loc or pain at present. Denies neck pain. Pt ambulates with walker at baseline and has been able to walk since fall.

## 2016-09-10 NOTE — ED Notes (Signed)
ED Provider at bedside. 

## 2016-09-10 NOTE — ED Provider Notes (Signed)
[facility] DEPT Provider Note   CSN: [id number] Arrival date & time: 09/10/16  1013     History   Chief Complaint Chief Complaint  Patient presents with  . Fall    HPI Nicholas [name] is a 61 y.o. male.  Patient is a 61 year old male who presents to the emergency department with a complaint of right rib area pain and lower back pain.  The patient states that he fell from a standing position 2 days ago. He states that he hurt his buttocks area and he also hurt his right ribs and lower back. The patient complains that since that time he has been having pain in the rib area when he coughs and sometimes when he takes a deep breath. He states he's had problems with his back in the past, but the pain and soreness worse since the fall. He's not had any loss of bowel or bladder function. He's not had any unusual numbness or loss of sensation in the private areas. The patient ambulates with a walker, and has been able to ambulate with walker since the fall. He has tried conservative measures at home for his pain, but states these are not helpful.   The history is provided by the patient.  Fall     Past Medical History:  Diagnosis Date  . Chronic knee pain   . Depression   . Diabetes ([place])   . Glaucoma   . Hyperlipidemia   . Hypertension   . MRSA (methicillin resistant Staphylococcus aureus)   . Poor circulation    leg   . Suicide attempt   . Testosterone deficiency     Patient Active Problem List   Diagnosis Date Noted  . Hypothyroidism 08/19/2016  . GERD (gastroesophageal reflux disease) 04/14/2016  . Charcot foot due to diabetes mellitus ([place]) 01/28/2016  . Benign essential tremor 01/28/2016  . Skin lesion of back 10/07/2015  . Back pain with left-sided radiculopathy 06/05/2015  . Hyperlipidemia 04/10/2015  . Abdominal wall bulge 01/15/2015  . Hx of adenomatous colonic polyps 02/21/2014  . Allergic rhinitis 07/24/2013  . Onychomycosis 05/21/2013  . 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 Back pain with radiation 04/20/2011  . Seborrheic dermatitis 11/20/2010  . Dermatomycosis 11/20/2010  . FATIGUE 04/01/2010  . CARPAL TUNNEL SYNDROME, BILATERAL 03/24/2010  . Hypogonadism in male 01/27/2010  . DEPRESSION/ANXIETY 01/27/2010  . CHF 01/13/2010  . DYSPNEA 01/13/2010  . Diabetes mellitus type 2 in obese ([place]) 12/12/2009  . MORBID OBESITY 12/12/2009  . Benign hypertension 12/12/2009  . PVD (peripheral vascular disease) ([place]) 12/12/2009    Past Surgical History:  Procedure Laterality Date  . CHOLECYSTECTOMY N/A 02/25/2015   Procedure: LAPAROSCOPIC CHOLECYSTECTOMY;  Surgeon: Aviva Signs, MD;  Location: [facility];  Service: General;  Laterality: N/A;  . COLONOSCOPY  03/2008   EPP:IRJJOA external hemorrhoidal tag, otherwise normal rectum/Two diminutive rectosigmoid polyps s/p bx/ Polyp in the opposite the ileocecal valve s/p bx. tubular adenoma.  . COLONOSCOPY N/A 03/23/2014   CZY:SAYTKZSW colon polyps throughout his rectum and colon. Sessile and pedunculated. The largest polyp was proximally 6 mm in dimensions in the retum at 3 cm with adjacent diminutive polyp.  There was also a polyps on ileocecal valve, ascending segmen descending and sigmoid segment. as the colon was tortuous & elongated requiring externa abd pressure and changing of the pts postion to reach cecum  . ESOPHAGOGASTRODUODENOSCOPY N/A 03/23/2014   RMR: 1. Patient had circumferential distal esophageal erosions within 5  mm of the GE junction.  No Barrett's esophagus. Tubular esophagus patent throughout its course. 2.  Diffuse gastric erosions. (1) 33mm area of healing ulceration in the antrum. No ulcer or infiltrating process observed. Patent pylorus. Normal-appearing first and second portion of the duodenum .  . kidney stones     cysto with litholapexy       Home Medications    Prior to Admission medications   Medication Sig Start Date End Date Taking?  Authorizing Provider  ANDROGEL PUMP 20.25 MG/ACT (1.62%) GEL Apply 1 application topically daily.  05/15/16  Yes Historical Provider, MD  aspirin EC 81 MG tablet Take 81 mg by mouth daily.   Yes Historical Provider, MD  brimonidine (ALPHAGAN P) 0.1 % SOLN Place 1 drop into both eyes 2 (two) times daily. One drop in each eye two times a day    Yes Historical Provider, MD  brimonidine-timolol (COMBIGAN) 0.2-0.5 % ophthalmic solution Place 1 drop into both eyes at bedtime. One drop in each eye two times a day     Yes Historical Provider, MD  citalopram (CELEXA) 40 MG tablet TAKE 1 TABLET BY MOUTH ONCE A DAY. 07/20/16  Yes Fayrene Helper, MD  clotrimazole-betamethasone (LOTRISONE) cream APPLY TO AFFECTED AREAS TWICE DAILY. Patient taking differently: Apply 1 application topically daily as needed (for skin irritation).  11/28/13  Yes Fayrene Helper, MD  gabapentin (NEURONTIN) 600 MG tablet TAKE ONE TABLET BY MOUTH 3 TIMES DAILY. 07/29/16  Yes Fayrene Helper, MD  HUMULIN R U-500 KWIKPEN 500 UNIT/ML kwikpen INJECT UP TO 100 UNITS SUBCUTANEOUSLY 30 MINUTES BEFORE MEALS. Patient taking differently: INJECT UP TO 50 UNITS SUBCUTANEOUSLY 30 MINUTES BEFORE MEALS. 06/17/16  Yes Cassandria Anger, MD  ketoconazole (NIZORAL) 2 % shampoo Apply 1 application topically every 30 (thirty) days.  05/26/16  Yes Historical Provider, MD  lamoTRIgine (LAMICTAL) 200 MG tablet TAKE 1 TABLET BY MOUTH ONCE A DAY. 07/21/16  Yes Fayrene Helper, MD  Levothyroxine Sodium 50 MCG CAPS Take 1 capsule (50 mcg total) by mouth daily before breakfast. 08/19/16  Yes Cassandria Anger, MD  lisinopril (PRINIVIL,ZESTRIL) 5 MG tablet Take 1 tablet (5 mg total) by mouth daily. 08/25/16  Yes Fayrene Helper, MD  metoprolol succinate (TOPROL-XL) 25 MG 24 hr tablet TAKE ONE TABLET BY MOUTH ONCE DAILY. 06/18/16  Yes Fayrene Helper, MD  naproxen (NAPROSYN) 500 MG tablet TAKE ONE TABLET BY MOUTH TWICE DAILY WITH A MEAL. 08/06/16   Yes Fayrene Helper, MD  NOVOFINE 30G X 8 MM MISC  05/20/16  Yes Historical Provider, MD  pantoprazole (PROTONIX) 40 MG tablet Take 40 mg by mouth 2 (two) times daily.   Yes Historical Provider, MD  simvastatin (ZOCOR) 40 MG tablet TAKE 1 TABLET BY MOUTH ONCE A DAY. 08/31/16  Yes Fayrene Helper, MD  traMADol (ULTRAM) 50 MG tablet TAKE 2 TABLETS BY MOUTH EVERY 8 HOURS AS NEEDED. 06/18/16  Yes Fayrene Helper, MD  TRAVATAN Z 0.004 % SOLN ophthalmic solution Place 1 drop into both eyes at bedtime.  08/30/15  Yes Historical Provider, MD  triamcinolone ointment (KENALOG) 0.1 % Apply 1 application topically 2 (two) times daily. Patient taking differently: Apply 1 application topically daily as needed (for skin irritation).  01/10/16  Yes Cassandria Anger, MD  VICTOZA 18 MG/3ML SOPN INJECT 1.8 MG SUBCUTANEOUSLY ONCE DAILY. 08/18/16  Yes Cassandria Anger, MD  Vitamin D, Ergocalciferol, (DRISDOL) 50000 units CAPS capsule TAKE ONE CAPSULE BY  MOUTH ONCE WEEKLY. 06/02/16  Yes Cassandria Anger, MD  acetaminophen-codeine (TYLENOL #3) 300-30 MG tablet Take 1-2 tablets by mouth every 6 (six) hours as needed for moderate pain. 09/10/16   Lily Kocher, PA-C  cyclobenzaprine (FLEXERIL) 10 MG tablet Take 1 tablet (10 mg total) by mouth 3 (three) times daily. 09/10/16   Lily Kocher, PA-C  furosemide (LASIX) 20 MG tablet Take 1 tablet (20 mg total) by mouth daily. Patient not taking: Reported on 09/10/2016 01/09/15   Fayrene Helper, MD  ibuprofen (ADVIL,MOTRIN) 600 MG tablet Take 1 tablet (600 mg total) by mouth every 6 (six) hours as needed. 09/10/16   Lily Kocher, PA-C    Family History Family History  Problem Relation Age of Onset  . Diabetes Mother   . Leukemia Mother   . Cancer Father     colon, age greater than 79?  Marland Kitchen Heart disease Father   . Glaucoma Father   . Parkinsonism Father     altzheimers , brain hemorrhage  . Colon cancer Neg Hx     Social History Social History    Substance Use Topics  . Smoking status: Former Smoker    Packs/day: 1.50    Years: 25.00    Types: Cigarettes    Quit date: 02/20/2002  . Smokeless tobacco: Former Systems developer     Comment: Quit x 13 years  . Alcohol use No     Allergies   Dilaudid [hydromorphone hcl]   Review of Systems Review of Systems  Respiratory:       Chest wall pain  Musculoskeletal: Positive for back pain.  All other systems reviewed and are negative.    Physical Exam Updated Vital Signs BP (!) 104/49   Pulse (!) 56   Temp 97.6 F (36.4 C) (Oral)   Resp 18   Ht 5\' 11"  (1.803 m)   Wt (!) 187.3 kg   SpO2 94%   BMI 57.60 kg/m   Physical Exam  Constitutional: He is oriented to person, place, and time. He appears well-developed and well-nourished.  Non-toxic appearance.  HENT:  Head: Normocephalic.  Right Ear: Tympanic membrane and external ear normal.  Left Ear: Tympanic membrane and external ear normal.  No hematoma or abrasion of the scalp.  Eyes: EOM and lids are normal. Pupils are equal, round, and reactive to light.  Neck: Normal range of motion. Neck supple. Carotid bruit is not present.  Cardiovascular: Normal rate, regular rhythm, normal heart sounds, intact distal pulses and normal pulses.   Pulmonary/Chest: Breath sounds normal. No respiratory distress.  Abdominal: Soft. Bowel sounds are normal. There is no tenderness. There is no guarding.    Musculoskeletal:       Lumbar back: He exhibits decreased range of motion and tenderness.  No visible bruising of the lumbar back area.  Lymphadenopathy:       Head (right side): No submandibular adenopathy present.       Head (left side): No submandibular adenopathy present.    He has no cervical adenopathy.  Neurological: He is alert and oriented to person, place, and time. He has normal strength. No cranial nerve deficit or sensory deficit.  Skin: Skin is warm and dry.  Psychiatric: He has a normal mood and affect. His speech is normal.   Nursing note and vitals reviewed.    ED Treatments / Results  Labs (all labs ordered are listed, but only abnormal results are displayed) Labs Reviewed - No data to display  EKG  EKG  Interpretation None       Radiology Dg Ribs Unilateral W/chest Right  Result Date: 09/10/2016 CLINICAL DATA:  Right rib and chest pain.  Fall. EXAM: RIGHT RIBS AND CHEST - 3+ VIEW COMPARISON:  08/30/2013 FINDINGS: No visible rib fracture. Heart is normal size. No confluent opacities, effusions or pneumothorax. IMPRESSION: No visible rib fracture.  No active disease. Electronically Signed   By: Rolm Baptise M.D.   On: 09/10/2016 12:30    Procedures Procedures (including critical care time)  Medications Ordered in ED Medications  HYDROcodone-acetaminophen (NORCO/VICODIN) 5-325 MG per tablet 2 tablet (2 tablets Oral Given 09/10/16 1148)  methocarbamol (ROBAXIN) tablet 1,000 mg (1,000 mg Oral Given 09/10/16 1148)  neomycin-bacitracin-polymyxin (NEOSPORIN) ointment (1 application Topical Given 09/10/16 1318)     Initial Impression / Assessment and Plan / ED Course  I have reviewed the triage vital signs and the nursing notes.  Pertinent labs & imaging results that were available during my care of the patient were reviewed by me and considered in my medical decision making (see chart for details).     **I have reviewed nursing notes, vital signs, and all appropriate lab and imaging results for this patient.*  Final Clinical Impressions(s) / ED Diagnoses MDM Patient sustained a fall 2 days ago. He has continued pain mostly in the right rib area. He has some pain and soreness and spasm involving the lower back and even in the buttocks/coccyx area. He is able to ambulate with walker. This is not new for the patient.  X-ray of the chest is negative for acute problem. X-ray of the ribs on the right are negative for acute problem.  Pulse oximetry has been from 92-97% on room air. The patient speaks in  complete sentences without problem.  Suspect the patient has contusion to the right ribs, contusion to the coccyx area following a fall. Patient will be treated with Tylenol codeine, Flexeril, and ibuprofen. The patient is to follow-up with Dr. Moshe Cipro if not improving.    Final diagnoses:  Contusion of ribs, right, initial encounter  Fall, initial encounter    New Prescriptions Discharge Medication List as of 09/10/2016  1:16 PM    START taking these medications   Details  acetaminophen-codeine (TYLENOL #3) 300-30 MG tablet Take 1-2 tablets by mouth every 6 (six) hours as needed for moderate pain., Starting Thu 09/10/2016, Print    cyclobenzaprine (FLEXERIL) 10 MG tablet Take 1 tablet (10 mg total) by mouth 3 (three) times daily., Starting Thu 09/10/2016, Print    ibuprofen (ADVIL,MOTRIN) 600 MG tablet Take 1 tablet (600 mg total) by mouth every 6 (six) hours as needed., Starting Thu 09/10/2016, Print         Lily Kocher, PA-C 09/10/16 2138    Francine Graven, DO 09/13/16 1544

## 2016-09-10 NOTE — ED Notes (Signed)
Pt assured this nurse that he still had his Incentive spirometry at home from a recent surgery.

## 2016-09-10 NOTE — ED Triage Notes (Signed)
Pt lost his balance when he tripped over his slick foam compression stocking this morning while standing and fell landing on his buttocks and back. Pt fell into a shelf on the wall and hit posterior side of head. Pt also c/o right sided rib pain. No LOC upon fall. Pt denies dizziness at time of fall.

## 2016-09-17 ENCOUNTER — Other Ambulatory Visit: Payer: Self-pay | Admitting: "Endocrinology

## 2016-09-21 ENCOUNTER — Other Ambulatory Visit: Payer: Self-pay | Admitting: "Endocrinology

## 2016-09-22 ENCOUNTER — Ambulatory Visit (INDEPENDENT_AMBULATORY_CARE_PROVIDER_SITE_OTHER): Payer: PPO | Admitting: Sports Medicine

## 2016-09-22 DIAGNOSIS — B351 Tinea unguium: Secondary | ICD-10-CM | POA: Diagnosis not present

## 2016-09-22 DIAGNOSIS — E11610 Type 2 diabetes mellitus with diabetic neuropathic arthropathy: Secondary | ICD-10-CM

## 2016-09-22 DIAGNOSIS — M79676 Pain in unspecified toe(s): Secondary | ICD-10-CM | POA: Diagnosis not present

## 2016-09-22 DIAGNOSIS — L97511 Non-pressure chronic ulcer of other part of right foot limited to breakdown of skin: Secondary | ICD-10-CM

## 2016-09-22 DIAGNOSIS — E1149 Type 2 diabetes mellitus with other diabetic neurological complication: Secondary | ICD-10-CM

## 2016-09-22 DIAGNOSIS — I739 Peripheral vascular disease, unspecified: Secondary | ICD-10-CM

## 2016-09-22 NOTE — Progress Notes (Signed)
Patient ID: Nicholas [name], male   DOB: Nov 21, 1955, 61 y.o.   MRN: [id number]  Subjective: Nicholas [name] is a 61 y.o. Diabetic male patient seen in office for Diabetic foot care/nail trim and to check brace that was made for the left; states that he hasn't been able to wear it because of the straps. Admits to pain in both feet requesting pain medication. Denies any other symptoms.    Patient Active Problem List   Diagnosis Date Noted  . Hypothyroidism 08/19/2016  . GERD (gastroesophageal reflux disease) 04/14/2016  . Charcot foot due to diabetes mellitus ([facility]) 01/28/2016  . Benign essential tremor 01/28/2016  . Skin lesion of back 10/07/2015  . Back pain with left-sided radiculopathy 06/05/2015  . Hyperlipidemia 04/10/2015  . Abdominal wall bulge 01/15/2015  . Hx of adenomatous colonic polyps 02/21/2014  . Allergic rhinitis 07/24/2013  . Onychomycosis 05/21/2013  . 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 Back pain with radiation 04/20/2011  . Seborrheic dermatitis 11/20/2010  . Dermatomycosis 11/20/2010  . FATIGUE 04/01/2010  . CARPAL TUNNEL SYNDROME, BILATERAL 03/24/2010  . Hypogonadism in male 01/27/2010  . DEPRESSION/ANXIETY 01/27/2010  . CHF 01/13/2010  . DYSPNEA 01/13/2010  . Diabetes mellitus type 2 in obese ([facility]) 12/12/2009  . MORBID OBESITY 12/12/2009  . Benign hypertension 12/12/2009  . PVD (peripheral vascular disease) ([facility]) 12/12/2009   Current Outpatient Prescriptions on File Prior to Visit  Medication Sig Dispense Refill  . acetaminophen-codeine (TYLENOL #3) 300-30 MG tablet Take 1-2 tablets by mouth every 6 (six) hours as needed for moderate pain. 12 tablet 0  . ANDROGEL PUMP 20.25 MG/ACT (1.62%) GEL Apply 1 application topically daily.     Marland Kitchen aspirin EC 81 MG tablet Take 81 mg by mouth daily.    . brimonidine (ALPHAGAN P) 0.1 % SOLN Place 1 drop into both eyes 2 (two) times daily. One drop in each eye two times a day     .  brimonidine-timolol (COMBIGAN) 0.2-0.5 % ophthalmic solution Place 1 drop into both eyes at bedtime. One drop in each eye two times a day      . citalopram (CELEXA) 40 MG tablet TAKE 1 TABLET BY MOUTH ONCE A DAY. 90 tablet 1  . clotrimazole-betamethasone (LOTRISONE) cream APPLY TO AFFECTED AREAS TWICE DAILY. (Patient taking differently: Apply 1 application topically daily as needed (for skin irritation). ) 45 g 0  . cyclobenzaprine (FLEXERIL) 10 MG tablet Take 1 tablet (10 mg total) by mouth 3 (three) times daily. 20 tablet 0  . furosemide (LASIX) 20 MG tablet Take 1 tablet (20 mg total) by mouth daily. (Patient not taking: Reported on 09/10/2016) 30 tablet 1  . gabapentin (NEURONTIN) 600 MG tablet TAKE ONE TABLET BY MOUTH 3 TIMES DAILY. 270 tablet 1  . HUMULIN R U-500 KWIKPEN 500 UNIT/ML kwikpen INJECT UP TO 100 UNITS SUBCUTANEOUSLY 30 MINUTES BEFORE MEALS. (Patient taking differently: INJECT UP TO 50 UNITS SUBCUTANEOUSLY 30 MINUTES BEFORE MEALS.) 18 mL 1  . ibuprofen (ADVIL,MOTRIN) 600 MG tablet Take 1 tablet (600 mg total) by mouth every 6 (six) hours as needed. 30 tablet 0  . ketoconazole (NIZORAL) 2 % shampoo Apply 1 application topically every 30 (thirty) days.     Marland Kitchen lamoTRIgine (LAMICTAL) 200 MG tablet TAKE 1 TABLET BY MOUTH ONCE A DAY. 30 tablet 3  . Levothyroxine Sodium 50 MCG CAPS Take 1 capsule (50 mcg total) by mouth daily before breakfast. 30 capsule 3  . lisinopril (PRINIVIL,ZESTRIL) 5  MG tablet Take 1 tablet (5 mg total) by mouth daily. 30 tablet 3  . metoprolol succinate (TOPROL-XL) 25 MG 24 hr tablet TAKE ONE TABLET BY MOUTH ONCE DAILY. 30 tablet 3  . naproxen (NAPROSYN) 500 MG tablet TAKE ONE TABLET BY MOUTH TWICE DAILY WITH A MEAL. 60 tablet 5  . NOVOFINE 30G X 8 MM MISC     . pantoprazole (PROTONIX) 40 MG tablet Take 40 mg by mouth 2 (two) times daily.    . simvastatin (ZOCOR) 40 MG tablet TAKE 1 TABLET BY MOUTH ONCE A DAY. 90 tablet 1  . traMADol (ULTRAM) 50 MG tablet TAKE 2  TABLETS BY MOUTH EVERY 8 HOURS AS NEEDED. 180 tablet 1  . TRAVATAN Z 0.004 % SOLN ophthalmic solution Place 1 drop into both eyes at bedtime.     . triamcinolone ointment (KENALOG) 0.1 % APPLY TO AFFECTED AREAS TWICE DAILY. 454 g 0  . VICTOZA 18 MG/3ML SOPN INJECT 1.8 MG SUBCUTANEOUSLY ONCE DAILY. 9 mL 2  . Vitamin D, Ergocalciferol, (DRISDOL) 50000 units CAPS capsule TAKE ONE CAPSULE BY MOUTH ONCE WEEKLY. 12 capsule 0   No current facility-administered medications on file prior to visit.    Allergies  Allergen Reactions  . Dilaudid [Hydromorphone Hcl] Shortness Of Breath    Recent Results (from the past 2160 hour(s))  Comprehensive metabolic panel     Status: Abnormal   Collection Time: 08/11/16  7:48 AM  Result Value Ref Range   Sodium 144 135 - 146 mmol/L   Potassium 4.8 3.5 - 5.3 mmol/L   Chloride 104 98 - 110 mmol/L   CO2 30 20 - 31 mmol/L   Glucose, Bld 123 (H) 65 - 99 mg/dL   BUN 12 7 - 25 mg/dL   Creat 5.36 6.44 - 0.34 mg/dL    Comment:   For patients > or = 62 years of age: The upper reference limit for Creatinine is approximately 13% higher for people identified as African-American.      Total Bilirubin 0.5 0.2 - 1.2 mg/dL   Alkaline Phosphatase 69 40 - 115 U/L   AST 15 10 - 35 U/L   ALT 13 9 - 46 U/L   Total Protein 6.1 6.1 - 8.1 g/dL   Albumin 3.9 3.6 - 5.1 g/dL   Calcium 8.9 8.6 - 74.2 mg/dL  Lipid panel     Status: Abnormal   Collection Time: 08/11/16  7:48 AM  Result Value Ref Range   Cholesterol 159 <200 mg/dL   Triglycerides 595 (H) <150 mg/dL   HDL 52 >63 mg/dL   Total CHOL/HDL Ratio 3.1 <5.0 Ratio   VLDL 35 (H) <30 mg/dL   LDL Cholesterol 72 <875 mg/dL  TSH     Status: None   Collection Time: 08/11/16  7:48 AM  Result Value Ref Range   TSH 3.53 0.40 - 4.50 mIU/L  T4, free     Status: None   Collection Time: 08/11/16  7:48 AM  Result Value Ref Range   Free T4 0.8 0.8 - 1.8 ng/dL  Hemoglobin I4P     Status: Abnormal   Collection Time: 08/11/16   7:48 AM  Result Value Ref Range   Hgb A1c MFr Bld 6.3 (H) <5.7 %    Comment:   For someone without known diabetes, a hemoglobin A1c value between 5.7% and 6.4% is consistent with prediabetes and should be confirmed with a follow-up test.   For someone with known diabetes, a value <7% indicates  that their diabetes is well controlled. A1c targets should be individualized based on duration of diabetes, age, co-morbid conditions and other considerations.   This assay result is consistent with an increased risk of diabetes.   Currently, no consensus exists regarding use of hemoglobin A1c for diagnosis of diabetes in children.      Mean Plasma Glucose 134 mg/dL    Objective: There were no vitals filed for this visit.  General: Patient is awake, alert, oriented x 3 and in no acute distress.  Dermatology: Skin is warm and dry bilateral with a partial thickness ulceration <0.5cm at right hallux with reactive keratosis with no signs of acute infection. Nails are long and thick with no signs of ingrowing or infection.    Vascular: Dorsalis Pedis pulse = 0/4 Bilateral,  Posterior Tibial pulse = 1/4 Bilateral faint,  Capillary Fill Time < 5 seconds, + chronic venous skin changes bilateral, unna boots in place.   Neurologic:Protective sensation diminished using the 5.07/10g Semmes Weinstein Monofilament. Vibratory diminished bilateral.   Musculosketal:  Decreased pain to dorsal midfoot on left with accompanying  swelling supportive of Charcot. No Pain with palpation to  ulcerated area at right hallux. + Hallux interphalangeus R>L hallux, No pain with compression to calves bilateral.   Assessment and Plan:  Problem List Items Addressed This Visit      Cardiovascular and Mediastinum   PVD (peripheral vascular disease) ([place])     Endocrine   Charcot foot due to diabetes mellitus ([place])    Other Visit Diagnoses    Toe ulcer, right, limited to breakdown of skin ([place])    -  Primary    Dermatophytosis of nail       Type II diabetes mellitus with neurological manifestations ([facility])       Pain of toe, unspecified laterality         -Examined patient  -Nails x 10 debrided using sterile nail nipper without incident  -Excisionally debrided right hallux ulceration to healthy bleeding tissue and applied iodosorb and bandaid dressing. Advised patient to do the same using betadine at home daily -Patient met with rick reguarding brace; Brace to be modified; WILL ORDER DIABETIC SHOES/EXTRA DEPTH AND WILL CAST WITH BRACE NEXT VISIT -Advised patient that I will not give any pain medication and that he will have to continue with Tramadol for pain as Rx by Dr. Moshe Cipro or follow up with his doctor about a change to medication  -Continue with gabapentin  -Patient to return to office for right toe ulceration check and shoe casting in 4 weeks or sooner if problems arise.  Landis Martins, DPM

## 2016-09-22 NOTE — Patient Instructions (Signed)
Use betadine and bandaid daily to toe ulcer

## 2016-10-05 ENCOUNTER — Telehealth: Payer: Self-pay

## 2016-10-05 NOTE — Telephone Encounter (Signed)
Pt called- left voicemail- he got a call from his insurance nurse and they told him that pantoprazole causes memory loss and bone loss. He is wanting to be changed to something else.

## 2016-10-08 NOTE — Telephone Encounter (Signed)
Please tell the patient that a study was done in [place] where they found that a certain number of people over 75 who had dementia also had been on PPIs; They cannot say that the PPIs CAUSED dementia. (for example: the sun rises every day he has GERD symptoms, but the sun rising doesn't CAUSE his GERD symptoms).  PPIs have been also associated with decreased mineral absorbtion which can result in bone density loss. His PCP can schedule a bone density scan to monitor for any loss of bone density.  I have a good article from [place] we can send him if he would like.  If he still wants to come off PPIs he can stop them and if he develops symptoms again, try Zantac 150 mg OTC (different class of medicine).  Let me know what he would like to do.

## 2016-10-09 NOTE — Telephone Encounter (Signed)
Pt is aware, he doesn't want to stop his ppi at this time. He would like to read the article. If you can get it, I will mail it to him.

## 2016-10-09 NOTE — Telephone Encounter (Signed)
Tried to call pt- NA 

## 2016-10-09 NOTE — Telephone Encounter (Signed)
The article was placed on your desk to mail. He can call with any questions.

## 2016-10-10 ENCOUNTER — Other Ambulatory Visit: Payer: Self-pay | Admitting: Family Medicine

## 2016-10-12 NOTE — Telephone Encounter (Signed)
Article has been mailed to the pt.

## 2016-10-13 ENCOUNTER — Other Ambulatory Visit: Payer: Self-pay

## 2016-10-13 MED ORDER — TRAMADOL HCL 50 MG PO TABS: ORAL_TABLET | ORAL | 0 refills | Status: DC

## 2016-10-14 ENCOUNTER — Ambulatory Visit: Payer: PPO

## 2016-10-20 ENCOUNTER — Ambulatory Visit (INDEPENDENT_AMBULATORY_CARE_PROVIDER_SITE_OTHER): Payer: PPO | Admitting: Sports Medicine

## 2016-10-20 ENCOUNTER — Encounter: Payer: Self-pay | Admitting: Sports Medicine

## 2016-10-20 VITALS — BP 126/80 | HR 68 | Resp 16

## 2016-10-20 DIAGNOSIS — E1165 Type 2 diabetes mellitus with hyperglycemia: Secondary | ICD-10-CM | POA: Diagnosis not present

## 2016-10-20 DIAGNOSIS — L97511 Non-pressure chronic ulcer of other part of right foot limited to breakdown of skin: Secondary | ICD-10-CM | POA: Diagnosis not present

## 2016-10-20 DIAGNOSIS — I739 Peripheral vascular disease, unspecified: Secondary | ICD-10-CM

## 2016-10-20 DIAGNOSIS — E1149 Type 2 diabetes mellitus with other diabetic neurological complication: Secondary | ICD-10-CM

## 2016-10-20 DIAGNOSIS — E11610 Type 2 diabetes mellitus with diabetic neuropathic arthropathy: Secondary | ICD-10-CM

## 2016-10-20 NOTE — Progress Notes (Signed)
Patient ID: Nicholas [name], male   DOB: 04-21-1956, 61 y.o.   MRN: [id number]  Subjective: Nicholas [name] is a 61 y.o. Diabetic male patient seen in office for Diabetic ulcer check. Reports has been using betadine. Admits to pain in both feet requesting pain medication again this visit. Denies any other symptoms.    Patient Active Problem List   Diagnosis Date Noted  . Hypothyroidism 08/19/2016  . GERD (gastroesophageal reflux disease) 04/14/2016  . Charcot foot due to diabetes mellitus ([employer]) 01/28/2016  . Benign essential tremor 01/28/2016  . Skin lesion of back 10/07/2015  . Back pain with left-sided radiculopathy 06/05/2015  . Hyperlipidemia 04/10/2015  . Abdominal wall bulge 01/15/2015  . Hx of adenomatous colonic polyps 02/21/2014  . Allergic rhinitis 07/24/2013  . Onychomycosis 05/21/2013  . 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 Back pain with radiation 04/20/2011  . Seborrheic dermatitis 11/20/2010  . Dermatomycosis 11/20/2010  . FATIGUE 04/01/2010  . CARPAL TUNNEL SYNDROME, BILATERAL 03/24/2010  . Hypogonadism in male 01/27/2010  . DEPRESSION/ANXIETY 01/27/2010  . CHF 01/13/2010  . DYSPNEA 01/13/2010  . Diabetes mellitus type 2 in obese ([place]) 12/12/2009  . MORBID OBESITY 12/12/2009  . Benign hypertension 12/12/2009  . PVD (peripheral vascular disease) ([place]) 12/12/2009   Current Outpatient Prescriptions on File Prior to Visit  Medication Sig Dispense Refill  . acetaminophen-codeine (TYLENOL #3) 300-30 MG tablet Take 1-2 tablets by mouth every 6 (six) hours as needed for moderate pain. 12 tablet 0  . ANDROGEL PUMP 20.25 MG/ACT (1.62%) GEL Apply 1 application topically daily.     Marland Kitchen aspirin EC 81 MG tablet Take 81 mg by mouth daily.    . brimonidine (ALPHAGAN P) 0.1 % SOLN Place 1 drop into both eyes 2 (two) times daily. One drop in each eye two times a day     . brimonidine-timolol (COMBIGAN) 0.2-0.5 % ophthalmic solution Place 1 drop  into both eyes at bedtime. One drop in each eye two times a day      . citalopram (CELEXA) 40 MG tablet TAKE 1 TABLET BY MOUTH ONCE A DAY. 90 tablet 1  . clotrimazole-betamethasone (LOTRISONE) cream APPLY TO AFFECTED AREAS TWICE DAILY. (Patient taking differently: Apply 1 application topically daily as needed (for skin irritation). ) 45 g 0  . cyclobenzaprine (FLEXERIL) 10 MG tablet Take 1 tablet (10 mg total) by mouth 3 (three) times daily. 20 tablet 0  . furosemide (LASIX) 20 MG tablet Take 1 tablet (20 mg total) by mouth daily. 30 tablet 1  . gabapentin (NEURONTIN) 600 MG tablet TAKE ONE TABLET BY MOUTH 3 TIMES DAILY. 270 tablet 1  . HUMULIN R U-500 KWIKPEN 500 UNIT/ML kwikpen INJECT UP TO 100 UNITS SUBCUTANEOUSLY 30 MINUTES BEFORE MEALS. (Patient taking differently: INJECT UP TO 50 UNITS SUBCUTANEOUSLY 30 MINUTES BEFORE MEALS.) 18 mL 1  . ibuprofen (ADVIL,MOTRIN) 600 MG tablet Take 1 tablet (600 mg total) by mouth every 6 (six) hours as needed. 30 tablet 0  . ketoconazole (NIZORAL) 2 % shampoo Apply 1 application topically every 30 (thirty) days.     Marland Kitchen lamoTRIgine (LAMICTAL) 200 MG tablet TAKE 1 TABLET BY MOUTH ONCE A DAY. 30 tablet 3  . Levothyroxine Sodium 50 MCG CAPS Take 1 capsule (50 mcg total) by mouth daily before breakfast. 30 capsule 3  . lisinopril (PRINIVIL,ZESTRIL) 5 MG tablet Take 1 tablet (5 mg total) by mouth daily. 30 tablet 3  . metoprolol succinate (TOPROL-XL) 25 MG 24  hr tablet TAKE ONE TABLET BY MOUTH ONCE DAILY. 30 tablet 3  . naproxen (NAPROSYN) 500 MG tablet TAKE ONE TABLET BY MOUTH TWICE DAILY WITH A MEAL. 60 tablet 5  . NOVOFINE 30G X 8 MM MISC     . pantoprazole (PROTONIX) 40 MG tablet Take 40 mg by mouth 2 (two) times daily.    . simvastatin (ZOCOR) 40 MG tablet TAKE 1 TABLET BY MOUTH ONCE A DAY. 90 tablet 1  . traMADol (ULTRAM) 50 MG tablet TAKE 2 TABLETS BY MOUTH EVERY 8 HOURS AS NEEDED. 180 tablet 0  . TRAVATAN Z 0.004 % SOLN ophthalmic solution Place 1 drop into  both eyes at bedtime.     . triamcinolone ointment (KENALOG) 0.1 % APPLY TO AFFECTED AREAS TWICE DAILY. 454 g 0  . VICTOZA 18 MG/3ML SOPN INJECT 1.8 MG SUBCUTANEOUSLY ONCE DAILY. 9 mL 2  . Vitamin D, Ergocalciferol, (DRISDOL) 50000 units CAPS capsule TAKE ONE CAPSULE BY MOUTH ONCE WEEKLY. 12 capsule 0   No current facility-administered medications on file prior to visit.    Allergies  Allergen Reactions  . Dilaudid [Hydromorphone Hcl] Shortness Of Breath    Recent Results (from the past 2160 hour(s))  Comprehensive metabolic panel     Status: Abnormal   Collection Time: 08/11/16  7:48 AM  Result Value Ref Range   Sodium 144 135 - 146 mmol/L   Potassium 4.8 3.5 - 5.3 mmol/L   Chloride 104 98 - 110 mmol/L   CO2 30 20 - 31 mmol/L   Glucose, Bld 123 (H) 65 - 99 mg/dL   BUN 12 7 - 25 mg/dL   Creat 1.17 0.70 - 1.25 mg/dL    Comment:   For patients > or = 62 years of age: The upper reference limit for Creatinine is approximately 13% higher for people identified as African-American.      Total Bilirubin 0.5 0.2 - 1.2 mg/dL   Alkaline Phosphatase 69 40 - 115 U/L   AST 15 10 - 35 U/L   ALT 13 9 - 46 U/L   Total Protein 6.1 6.1 - 8.1 g/dL   Albumin 3.9 3.6 - 5.1 g/dL   Calcium 8.9 8.6 - 10.3 mg/dL  Lipid panel     Status: Abnormal   Collection Time: 08/11/16  7:48 AM  Result Value Ref Range   Cholesterol 159 <200 mg/dL   Triglycerides 175 (H) <150 mg/dL   HDL 52 >40 mg/dL   Total CHOL/HDL Ratio 3.1 <5.0 Ratio   VLDL 35 (H) <30 mg/dL   LDL Cholesterol 72 <100 mg/dL  TSH     Status: None   Collection Time: 08/11/16  7:48 AM  Result Value Ref Range   TSH 3.53 0.40 - 4.50 mIU/L  T4, free     Status: None   Collection Time: 08/11/16  7:48 AM  Result Value Ref Range   Free T4 0.8 0.8 - 1.8 ng/dL  Hemoglobin A1c     Status: Abnormal   Collection Time: 08/11/16  7:48 AM  Result Value Ref Range   Hgb A1c MFr Bld 6.3 (H) <5.7 %    Comment:   For someone without known diabetes, a  hemoglobin A1c value between 5.7% and 6.4% is consistent with prediabetes and should be confirmed with a follow-up test.   For someone with known diabetes, a value <7% indicates that their diabetes is well controlled. A1c targets should be individualized based on duration of diabetes, age, co-morbid conditions and other considerations.  This assay result is consistent with an increased risk of diabetes.   Currently, no consensus exists regarding use of hemoglobin A1c for diagnosis of diabetes in children.      Mean Plasma Glucose 134 mg/dL    Objective: There were no vitals filed for this visit.  General: Patient is awake, alert, oriented x 3 and in no acute distress.  Dermatology: Skin is warm and dry bilateral with a partial thickness ulceration measures 0.4x0.3cm cm at right hallux with reactive keratosis with no signs of acute infection. Nails are short and thick with no signs of ingrowing or infection.    Vascular: Dorsalis Pedis pulse = 0/4 Bilateral,  Posterior Tibial pulse = 1/4 Bilateral faint,  Capillary Fill Time < 5 seconds, + chronic venous skin changes bilateral.  Neurologic:Protective sensation diminished using the 5.07/10g Semmes Weinstein Monofilament. Vibratory diminished bilateral.   Musculosketal:  No pain to dorsal midfoot on left with accompanying  swelling supportive of Charcot. No Pain with palpation to  ulcerated area at right hallux. + Hallux interphalangeus R>L hallux, No pain with compression to calves bilateral.   Assessment and Plan:  Problem List Items Addressed This Visit      Cardiovascular and Mediastinum   PVD (peripheral vascular disease) ([place])     Endocrine   Charcot foot due to diabetes mellitus ([place])    Other Visit Diagnoses    Toe ulcer, right, limited to breakdown of skin ([place])    -  Primary   Type II diabetes mellitus with neurological manifestations ([place])         -Examined patient  -Excisionally debrided right hallux ulceration  to healthy bleeding tissue and applied iodosorb and bandaid dressing. Advised patient to do the same using betadine at home daily -Patient met with rick reguarding brace; Brace to be modified and should be in later this week; WILL ORDER DIABETIC SHOES/EXTRA DEPTH AND WILL CAST WITH BRACE NEXT VISIT -Advised patient that I will not give any pain medication and that he will have to continue with Tramadol for pain as Rx by Dr. Moshe Cipro or follow up with his doctor about a change to medication; Re-order pain management referral per pain request  -Continue with gabapentin  -Patient to return to office for brace dispensing and shoe casting with Liliane Channel or sooner if problems arise.  Landis Martins, DPM

## 2016-10-20 NOTE — Patient Instructions (Signed)
Polyester blend socks

## 2016-10-21 ENCOUNTER — Ambulatory Visit: Payer: PPO

## 2016-10-21 ENCOUNTER — Telehealth: Payer: Self-pay | Admitting: *Deleted

## 2016-10-21 DIAGNOSIS — E1140 Type 2 diabetes mellitus with diabetic neuropathy, unspecified: Secondary | ICD-10-CM

## 2016-10-21 DIAGNOSIS — E1149 Type 2 diabetes mellitus with other diabetic neurological complication: Principal | ICD-10-CM

## 2016-10-21 DIAGNOSIS — I739 Peripheral vascular disease, unspecified: Secondary | ICD-10-CM

## 2016-10-21 NOTE — Telephone Encounter (Addendum)
-----   Message from Landis Martins, [place] sent at 10/20/2016  9:59 PM EDT ----- Regarding: Pain management Patient has pain all over worse in feet has been on narcotics and had pain management in the past. Patient currently on Tramadol from Dr. Moshe Cipro with no relief. Please consult pain management for further recommendations. Patient prefers to see a pain management doctor close to our office, if possible. -Dr. Cannon Kettle. Faxed required referral form, clinicals and demographics to [facility].

## 2016-10-22 ENCOUNTER — Other Ambulatory Visit: Payer: Self-pay | Admitting: Family Medicine

## 2016-10-22 ENCOUNTER — Other Ambulatory Visit: Payer: Self-pay | Admitting: Gastroenterology

## 2016-10-28 ENCOUNTER — Other Ambulatory Visit: Payer: PPO

## 2016-11-02 ENCOUNTER — Ambulatory Visit (INDEPENDENT_AMBULATORY_CARE_PROVIDER_SITE_OTHER): Payer: PPO

## 2016-11-02 VITALS — BP 128/80 | HR 71 | Temp 98.8°F | Ht 71.0 in | Wt >= 6400 oz

## 2016-11-02 DIAGNOSIS — Z0000 Encounter for general adult medical examination without abnormal findings: Secondary | ICD-10-CM

## 2016-11-02 NOTE — Patient Instructions (Addendum)
Nicholas [name] , Thank you for taking time to come for your Medicare Wellness Visit. I appreciate your ongoing commitment to your health goals. Please review the following plan we discussed and let me know if I can assist you in the future.   Screening recommendations/referrals: Colonoscopy: Up to date, next due 02/2017 Diabetic Eye Exam: Up to date, next due 05/2017 Recommended yearly dental visit for hygiene and checkup  Vaccinations: Influenza vaccine: Due 01/2017 Pneumococcal vaccine: Up to date, next due at age 79 Tdap vaccine: Up to date, next due 02/2023 Shingles vaccine: Done    Advanced directives: Advance directive discussed with you today. I have provided a copy for you to complete at home and have notarized. Once this is complete please bring a copy in to our office so we can scan it into your chart.  Conditions/risks identified: Obese, recommend starting a routine exercise program at least 3 days a week for 30-45 minutes at a time as tolerated.   Next appointment: Follow up with Dr. Moshe Cipro on 11/10/2016 at 10:40 am. Follow up in 1 year for your annual wellness visit.  Preventive Care 40-64 Years, Male Preventive care refers to lifestyle choices and visits with your health care provider that can promote health and wellness. What does preventive care include?  A yearly physical exam. This is also called an annual well check.  Dental exams once or twice a year.  Routine eye exams. Ask your health care provider how often you should have your eyes checked.  Personal lifestyle choices, including:  Daily care of your teeth and gums.  Regular physical activity.  Eating a healthy diet.  Avoiding tobacco and drug use.  Limiting alcohol use.  Practicing safe sex.  Taking low-dose aspirin every day starting at age 74. What happens during an annual well check? The services and screenings done by your health care provider during your annual well check will depend on your age,  overall health, lifestyle risk factors, and family history of disease. Counseling  Your health care provider may ask you questions about your:  Alcohol use.  Tobacco use.  Drug use.  Emotional well-being.  Home and relationship well-being.  Sexual activity.  Eating habits.  Work and work Statistician. Screening  You may have the following tests or measurements:  Height, weight, and BMI.  Blood pressure.  Lipid and cholesterol levels. These may be checked every 5 years, or more frequently if you are over 55 years old.  Skin check.  Lung cancer screening. You may have this screening every year starting at age 82 if you have a 30-pack-year history of smoking and currently smoke or have quit within the past 15 years.  Fecal occult blood test (FOBT) of the stool. You may have this test every year starting at age 9.  Flexible sigmoidoscopy or colonoscopy. You may have a sigmoidoscopy every 5 years or a colonoscopy every 10 years starting at age 12.  Prostate cancer screening. Recommendations will vary depending on your family history and other risks.  Hepatitis C blood test.  Hepatitis B blood test.  Sexually transmitted disease (STD) testing.  Diabetes screening. This is done by checking your blood sugar (glucose) after you have not eaten for a while (fasting). You may have this done every 1-3 years. Discuss your test results, treatment options, and if necessary, the need for more tests with your health care provider. Vaccines  Your health care provider may recommend certain vaccines, such as:  Influenza vaccine. This is recommended  every year.  Tetanus, diphtheria, and acellular pertussis (Tdap, Td) vaccine. You may need a Td booster every 10 years.  Zoster vaccine. You may need this after age 58.  Pneumococcal 13-valent conjugate (PCV13) vaccine. You may need this if you have certain conditions and have not been vaccinated.  Pneumococcal polysaccharide (PPSV23)  vaccine. You may need one or two doses if you smoke cigarettes or if you have certain conditions. Talk to your health care provider about which screenings and vaccines you need and how often you need them. This information is not intended to replace advice given to you by your health care provider. Make sure you discuss any questions you have with your health care provider. Document Released: 07/12/2015 Document Revised: 03/04/2016 Document Reviewed: 04/16/2015 Elsevier Interactive Patient Education  2017 [place] Prevention in the Home Falls can cause injuries. They can happen to people of all ages. There are many things you can do to make your home safe and to help prevent falls. What can I do on the outside of my home?  Regularly fix the edges of walkways and driveways and fix any cracks.  Remove anything that might make you trip as you walk through a door, such as a raised step or threshold.  Trim any bushes or trees on the path to your home.  Use bright outdoor lighting.  Clear any walking paths of anything that might make someone trip, such as rocks or tools.  Regularly check to see if handrails are loose or broken. Make sure that both sides of any steps have handrails.  Any raised decks and porches should have guardrails on the edges.  Have any leaves, snow, or ice cleared regularly.  Use sand or salt on walking paths during winter.  Clean up any spills in your garage right away. This includes oil or grease spills. What can I do in the bathroom?  Use night lights.  Install grab bars by the toilet and in the tub and shower. Do not use towel bars as grab bars.  Use non-skid mats or decals in the tub or shower.  If you need to sit down in the shower, use a plastic, non-slip stool.  Keep the floor dry. Clean up any water that spills on the floor as soon as it happens.  Remove soap buildup in the tub or shower regularly.  Attach bath mats securely with  double-sided non-slip rug tape.  Do not have throw rugs and other things on the floor that can make you trip. What can I do in the bedroom?  Use night lights.  Make sure that you have a light by your bed that is easy to reach.  Do not use any sheets or blankets that are too big for your bed. They should not hang down onto the floor.  Have a firm chair that has side arms. You can use this for support while you get dressed.  Do not have throw rugs and other things on the floor that can make you trip. What can I do in the kitchen?  Clean up any spills right away.  Avoid walking on wet floors.  Keep items that you use a lot in easy-to-reach places.  If you need to reach something above you, use a strong step stool that has a grab bar.  Keep electrical cords out of the way.  Do not use floor polish or wax that makes floors slippery. If you must use wax, use non-skid floor wax.  Do not have throw rugs and other things on the floor that can make you trip. What can I do with my stairs?  Do not leave any items on the stairs.  Make sure that there are handrails on both sides of the stairs and use them. Fix handrails that are broken or loose. Make sure that handrails are as long as the stairways.  Check any carpeting to make sure that it is firmly attached to the stairs. Fix any carpet that is loose or worn.  Avoid having throw rugs at the top or bottom of the stairs. If you do have throw rugs, attach them to the floor with carpet tape.  Make sure that you have a light switch at the top of the stairs and the bottom of the stairs. If you do not have them, ask someone to add them for you. What else can I do to help prevent falls?  Wear shoes that:  Do not have high heels.  Have rubber bottoms.  Are comfortable and fit you well.  Are closed at the toe. Do not wear sandals.  If you use a stepladder:  Make sure that it is fully opened. Do not climb a closed stepladder.  Make  sure that both sides of the stepladder are locked into place.  Ask someone to hold it for you, if possible.  Clearly mark and make sure that you can see:  Any grab bars or handrails.  First and last steps.  Where the edge of each step is.  Use tools that help you move around (mobility aids) if they are needed. These include:  Canes.  Walkers.  Scooters.  Crutches.  Turn on the lights when you go into a dark area. Replace any light bulbs as soon as they burn out.  Set up your furniture so you have a clear path. Avoid moving your furniture around.  If any of your floors are uneven, fix them.  If there are any pets around you, be aware of where they are.  Review your medicines with your doctor. Some medicines can make you feel dizzy. This can increase your chance of falling. Ask your doctor what other things that you can do to help prevent falls. This information is not intended to replace advice given to you by your health care provider. Make sure you discuss any questions you have with your health care provider. Document Released: 04/11/2009 Document Revised: 11/21/2015 Document Reviewed: 07/20/2014 Elsevier Interactive Patient Education  2017 [employer].

## 2016-11-02 NOTE — Progress Notes (Signed)
Subjective:   JAMESEN STAHNKE is a 61 y.o. male who presents for Medicare Annual/Subsequent preventive examination.  Review of Systems:  Cardiac Risk Factors include: diabetes mellitus;advanced age (>60men, >14 women);dyslipidemia;hypertension;male gender;obesity (BMI >30kg/m2);sedentary lifestyle     Objective:    Vitals: BP 128/80   Pulse 71   Temp 98.8 F (37.1 C) (Oral)   Ht 5\' 11"  (1.803 m)   Wt (!) 424 lb 0.6 oz (192.3 kg)   SpO2 93%   BMI 59.14 kg/m   Body mass index is 59.14 kg/m.  Tobacco History  Smoking Status  . Former Smoker  . Packs/day: 1.50  . Years: 25.00  . Types: Cigarettes  . Quit date: 02/20/2002  Smokeless Tobacco  . Former Systems developer  . Types: Snuff  . Quit date: 11/02/1980    Comment: Quit x 15 years     Counseling given: Not Answered   Past Medical History:  Diagnosis Date  . Chronic knee pain   . Depression   . Diabetes ([place])   . Glaucoma   . Hyperlipidemia   . Hypertension   . MRSA (methicillin resistant Staphylococcus aureus)   . Poor circulation    leg   . Suicide attempt ([place])   . Testosterone deficiency    Past Surgical History:  Procedure Laterality Date  . CHOLECYSTECTOMY N/A 02/25/2015   Procedure: LAPAROSCOPIC CHOLECYSTECTOMY;  Surgeon: Aviva Signs, MD;  Location: [facility];  Service: General;  Laterality: N/A;  . COLONOSCOPY  03/2008   EZM:OQHUTM external hemorrhoidal tag, otherwise normal rectum/Two diminutive rectosigmoid polyps s/p bx/ Polyp in the opposite the ileocecal valve s/p bx. tubular adenoma.  . COLONOSCOPY N/A 03/23/2014   LYY:TKPTWSFK colon polyps throughout his rectum and colon. Sessile and pedunculated. The largest polyp was proximally 6 mm in dimensions in the retum at 3 cm with adjacent diminutive polyp.  There was also a polyps on ileocecal valve, ascending segmen descending and sigmoid segment. as the colon was tortuous & elongated requiring externa abd pressure and changing of the pts postion to reach cecum    . ESOPHAGOGASTRODUODENOSCOPY N/A 03/23/2014   RMR: 1. Patient had circumferential distal esophageal erosions within 5 mm of the GE junction.  No Barrett's esophagus. Tubular esophagus patent throughout its course. 2.  Diffuse gastric erosions. (1) 58mm area of healing ulceration in the antrum. No ulcer or infiltrating process observed. Patent pylorus. Normal-appearing first and second portion of the duodenum .  . kidney stones     cysto with litholapexy   Family History  Problem Relation Age of Onset  . Diabetes Mother   . Leukemia Mother   . Cancer Father     colon, age greater than 82?  Marland Kitchen Heart disease Father   . Glaucoma Father   . Parkinsonism Father     brain hemorrhage  . Alzheimer's disease Father   . Glaucoma Sister   . Dementia Sister   . Colon cancer Neg Hx    History  Sexual Activity  . Sexual activity: Not Currently  . Birth control/ protection: None    Outpatient Encounter Prescriptions as of 11/02/2016  Medication Sig  . ANDROGEL PUMP 20.25 MG/ACT (1.62%) GEL Apply 1 application topically daily.   Marland Kitchen aspirin EC 81 MG tablet Take 81 mg by mouth daily.  . brimonidine-timolol (COMBIGAN) 0.2-0.5 % ophthalmic solution Place 1 drop into both eyes at bedtime. One drop in each eye two times a day    . citalopram (CELEXA) 40 MG tablet TAKE 1 TABLET  BY MOUTH ONCE A DAY.  . cyclobenzaprine (FLEXERIL) 10 MG tablet Take 1 tablet (10 mg total) by mouth 3 (three) times daily. (Patient taking differently: Take 10 mg by mouth 3 (three) times daily as needed for muscle spasms. )  . gabapentin (NEURONTIN) 600 MG tablet TAKE ONE TABLET BY MOUTH 3 TIMES DAILY.  Marland Kitchen HUMULIN R U-500 KWIKPEN 500 UNIT/ML kwikpen INJECT UP TO 100 UNITS SUBCUTANEOUSLY 30 MINUTES BEFORE MEALS. (Patient taking differently: INJECT UP TO 50 UNITS SUBCUTANEOUSLY 30 MINUTES BEFORE MEALS.)  . ketoconazole (NIZORAL) 2 % shampoo Apply 1 application topically every 30 (thirty) days.   Marland Kitchen lamoTRIgine (LAMICTAL) 200 MG tablet  TAKE 1 TABLET BY MOUTH ONCE A DAY.  Marland Kitchen Levothyroxine Sodium 50 MCG CAPS Take 1 capsule (50 mcg total) by mouth daily before breakfast.  . lisinopril (PRINIVIL,ZESTRIL) 5 MG tablet Take 1 tablet (5 mg total) by mouth daily.  . metoprolol succinate (TOPROL-XL) 25 MG 24 hr tablet TAKE ONE TABLET BY MOUTH ONCE DAILY.  . naproxen (NAPROSYN) 500 MG tablet TAKE ONE TABLET BY MOUTH TWICE DAILY WITH A MEAL.  Marland Kitchen NOVOFINE 30G X 8 MM MISC   . pantoprazole (PROTONIX) 40 MG tablet TAKE (1) TABLET BY MOUTH TWICE DAILY.  . simvastatin (ZOCOR) 40 MG tablet TAKE 1 TABLET BY MOUTH ONCE A DAY.  . traMADol (ULTRAM) 50 MG tablet TAKE 2 TABLETS BY MOUTH EVERY 8 HOURS AS NEEDED.  Marland Kitchen TRAVATAN Z 0.004 % SOLN ophthalmic solution Place 1 drop into both eyes at bedtime.   . triamcinolone ointment (KENALOG) 0.1 % APPLY TO AFFECTED AREAS TWICE DAILY.  Marland Kitchen VICTOZA 18 MG/3ML SOPN INJECT 1.8 MG SUBCUTANEOUSLY ONCE DAILY.  Marland Kitchen Vitamin D, Ergocalciferol, (DRISDOL) 50000 units CAPS capsule TAKE ONE CAPSULE BY MOUTH ONCE WEEKLY.  . furosemide (LASIX) 20 MG tablet Take 1 tablet (20 mg total) by mouth daily. (Patient not taking: Reported on 11/02/2016)  . [DISCONTINUED] acetaminophen-codeine (TYLENOL #3) 300-30 MG tablet Take 1-2 tablets by mouth every 6 (six) hours as needed for moderate pain.  . [DISCONTINUED] brimonidine (ALPHAGAN P) 0.1 % SOLN Place 1 drop into both eyes 2 (two) times daily. One drop in each eye two times a day   . [DISCONTINUED] clotrimazole-betamethasone (LOTRISONE) cream APPLY TO AFFECTED AREAS TWICE DAILY. (Patient taking differently: Apply 1 application topically daily as needed (for skin irritation). )  . [DISCONTINUED] ibuprofen (ADVIL,MOTRIN) 600 MG tablet Take 1 tablet (600 mg total) by mouth every 6 (six) hours as needed.   No facility-administered encounter medications on file as of 11/02/2016.     Activities of Daily Living In your present state of health, do you have any difficulty performing the following  activities: 11/02/2016  Hearing? N  Vision? N  Difficulty concentrating or making decisions? N  Walking or climbing stairs? Y  Dressing or bathing? N  Doing errands, shopping? N  Preparing Food and eating ? N  Using the Toilet? N  In the past six months, have you accidently leaked urine? N  Do you have problems with loss of bowel control? N  Managing your Medications? N  Managing your Finances? N  Housekeeping or managing your Housekeeping? N  Some recent data might be hidden    Patient Care Team: Fayrene Helper, MD as PCP - General Rourk, Cristopher Estimable, MD as Consulting Physician (Gastroenterology) Cassandria Anger, MD as Consulting Physician (Endocrinology) Madelin Headings, DO (Osteopathic Medicine) Franchot Gallo, MD as Consulting Physician (Urology) Landis Martins, DPM as Consulting Physician (Podiatry)  Assessment:    Exercise Activities and Dietary recommendations Current Exercise Habits: The patient does not participate in regular exercise at present, Exercise limited by: Other - see comments (pain in left foot)  Goals    . Exercise 3x per week (30 min per time)          Recommend starting a routine exercise program at least 3 days a week for 30-45 minutes at a time as tolerated.        Fall Risk Fall Risk  11/02/2016 08/19/2016 06/11/2016 05/13/2016 10/10/2015  Falls in the past year? Yes No Yes No No  Number falls in past yr: 2 or more - 1 - -  Injury with Fall? No - - - -  Risk Factor Category  High Fall Risk - - - -  Risk for fall due to : History of fall(s);Impaired balance/gait;Impaired mobility - - - -  Follow up Falls prevention discussed;Education provided - - - -   Depression Screen PHQ 2/9 Scores 11/02/2016 08/19/2016 05/13/2016 10/16/2015  PHQ - 2 Score 0 0 0 0  PHQ- 9 Score - - - -    Cognitive Function: Normal   6CIT Screen 11/02/2016  What Year? 0 points  What month? 0 points  What time? 0 points  Count back from 20 0 points  Months in  reverse 0 points  Repeat phrase 0 points  Total Score 0    Immunization History  Administered Date(s) Administered  . Influenza Whole 04/01/2010  . Influenza,inj,Quad PF,36+ Mos 03/08/2013  . Pneumococcal Conjugate-13 07/09/2014  . Pneumococcal Polysaccharide-23 04/01/2010, 10/07/2015  . Tdap 11/24/2011  . Zoster 12/12/2014   Screening Tests Health Maintenance  Topic Date Due  . INFLUENZA VACCINE  01/27/2017  . HEMOGLOBIN A1C  02/08/2017  . FOOT EXAM  02/15/2017  . COLONOSCOPY  03/23/2017  . OPHTHALMOLOGY EXAM  05/29/2017  . TETANUS/TDAP  11/23/2021  . PNEUMOCOCCAL POLYSACCHARIDE VACCINE  Completed  . Hepatitis C Screening  Completed  . HIV Screening  Completed      Plan:   I have personally reviewed and noted the following in the patient's chart:   . Medical and social history . Use of alcohol, tobacco or illicit drugs  . Current medications and supplements . Functional ability and status . Nutritional status . Physical activity . Advanced directives . List of other physicians . Hospitalizations, surgeries, and ER visits in previous 12 months . Vitals . Screenings to include cognitive, depression, and falls . Referrals and appointments: Community resource referral sent today to help patient find assistance with dental care, and cleaning his home.  In addition, I have reviewed and discussed with patient certain preventive protocols, quality metrics, and best practice recommendations. A written personalized care plan for preventive services as well as general preventive health recommendations were provided to patient.     Stormy Fabian, LPN  01/03/1164

## 2016-11-10 ENCOUNTER — Ambulatory Visit (INDEPENDENT_AMBULATORY_CARE_PROVIDER_SITE_OTHER): Payer: PPO | Admitting: Family Medicine

## 2016-11-10 ENCOUNTER — Other Ambulatory Visit: Payer: PPO

## 2016-11-10 ENCOUNTER — Encounter: Payer: Self-pay | Admitting: Family Medicine

## 2016-11-10 VITALS — BP 122/80 | HR 79 | Resp 16 | Ht 71.0 in | Wt >= 6400 oz

## 2016-11-10 DIAGNOSIS — E669 Obesity, unspecified: Secondary | ICD-10-CM | POA: Diagnosis not present

## 2016-11-10 DIAGNOSIS — E11628 Type 2 diabetes mellitus with other skin complications: Secondary | ICD-10-CM | POA: Diagnosis not present

## 2016-11-10 DIAGNOSIS — L84 Corns and callosities: Secondary | ICD-10-CM

## 2016-11-10 DIAGNOSIS — F341 Dysthymic disorder: Secondary | ICD-10-CM | POA: Diagnosis not present

## 2016-11-10 DIAGNOSIS — I10 Essential (primary) hypertension: Secondary | ICD-10-CM | POA: Diagnosis not present

## 2016-11-10 DIAGNOSIS — E1169 Type 2 diabetes mellitus with other specified complication: Secondary | ICD-10-CM

## 2016-11-10 DIAGNOSIS — E782 Mixed hyperlipidemia: Secondary | ICD-10-CM | POA: Diagnosis not present

## 2016-11-10 NOTE — Patient Instructions (Addendum)
F/u in 5. 5 month, call if you need mwe before  Please start swimming again  Please cut back on cheese    Sorry about the losses  Hopefully the boot will work    Fasting lipid, cmp and EGFR in 5.5 months  Thank you  for choosing [facility]. We consider it a privelige to serve you.  Delivering excellent health care in a caring and  compassionate way is our goal.  Partnering with you,  so that together we can achieve this goal is our strategy.

## 2016-11-11 ENCOUNTER — Other Ambulatory Visit: Payer: Self-pay | Admitting: "Endocrinology

## 2016-11-11 DIAGNOSIS — E039 Hypothyroidism, unspecified: Secondary | ICD-10-CM | POA: Diagnosis not present

## 2016-11-11 DIAGNOSIS — E1169 Type 2 diabetes mellitus with other specified complication: Secondary | ICD-10-CM | POA: Diagnosis not present

## 2016-11-11 DIAGNOSIS — E669 Obesity, unspecified: Secondary | ICD-10-CM | POA: Diagnosis not present

## 2016-11-11 DIAGNOSIS — E291 Testicular hypofunction: Secondary | ICD-10-CM | POA: Diagnosis not present

## 2016-11-11 LAB — COMPREHENSIVE METABOLIC PANEL
ALT: 15 U/L (ref 9–46)
AST: 15 U/L (ref 10–35)
Albumin: 3.9 g/dL (ref 3.6–5.1)
Alkaline Phosphatase: 72 U/L (ref 40–115)
BILIRUBIN TOTAL: 0.3 mg/dL (ref 0.2–1.2)
BUN: 28 mg/dL — AB (ref 7–25)
CALCIUM: 9.5 mg/dL (ref 8.6–10.3)
CO2: 22 mmol/L (ref 20–31)
Chloride: 105 mmol/L (ref 98–110)
Creat: 1.18 mg/dL (ref 0.70–1.25)
GLUCOSE: 201 mg/dL — AB (ref 65–99)
Potassium: 4.6 mmol/L (ref 3.5–5.3)
SODIUM: 139 mmol/L (ref 135–146)
Total Protein: 6.3 g/dL (ref 6.1–8.1)

## 2016-11-12 LAB — HEMOGLOBIN A1C
HEMOGLOBIN A1C: 7 % — AB (ref <5.7)
Mean Plasma Glucose: 154 mg/dL

## 2016-11-12 LAB — TSH: TSH: 2.14 mIU/L (ref 0.40–4.50)

## 2016-11-12 LAB — T4, FREE: Free T4: 1.1 ng/dL (ref 0.8–1.8)

## 2016-11-13 ENCOUNTER — Encounter: Payer: Self-pay | Admitting: Physical Medicine & Rehabilitation

## 2016-11-14 ENCOUNTER — Other Ambulatory Visit: Payer: Self-pay | Admitting: "Endocrinology

## 2016-11-15 NOTE — Progress Notes (Signed)
Nicholas [name]     MRN: [id number]      DOB: [birth year]   HPI Nicholas [name] is here for follow up and re-evaluation of chronic medical conditions, medication management and review of any available recent lab and radiology data.  Preventive health is updated, specifically  Cancer screening and Immunization.  Increased dpression and anxiety following recent losses in his family , which he is trying to cope with, fortunately he has a new girlfriend who is supporive Becoming frustrated with suppot footwear which he is incapable of putting on and removing himself , once he does have it on, it fits him well The PT denies any adverse reactions to current medications since the last visit.  Denies polyuria, polydipsia, blurred vision , or hypoglycemic episodes.   ROS Denies recent fever or chills. Denies sinus pressure, nasal congestion, ear pain or sore throat. Denies chest congestion, productive cough or wheezing. Denies chest pains, palpitations and leg swelling Denies abdominal pain, nausea, vomiting,diarrhea or constipation.   Denies dysuria, frequency, hesitancy or incontinence. C/o chronic joint pain, swelling and limitation in mobility. Denies headaches, seizures, c/o lower extremity  numbness, and tingling.   C/o Denies depression, anxiety or insomnia. Denies skin break down or rash.   PE  BP 122/80   Pulse 79   Resp 16   Ht 5\' 11"  (1.803 m)   Wt (!) 422 lb (191.4 kg)   SpO2 92%   BMI 58.86 kg/m   Patient alert and oriented and in no cardiopulmonary distress.  HEENT: No facial asymmetry, EOMI,   oropharynx pink and moist.  Neck supple no JVD, no mass.  Chest: Clear to auscultation bilaterally.  CVS: S1, S2 no murmurs, no S3.Regular rate.  ABD: Soft non tender.   Ext: No edema  MS: decreased  ROM spine, shoulders, hips and knees.  Skin: Intact, no ulcerations hyperpigmented  rash noted on shins  Psych: Good eye contact, normal affect. Memory intact not anxious  or depressed appearing.  CNS: CN 2-12 intact, power,  normal throughout.no focal deficits noted.   Assessment & Plan  Hyperlipidemia Hyperlipidemia:Low fat diet discussed and encouraged.   Lipid Panel  Lab Results  Component Value Date   CHOL 159 08/11/2016   HDL 52 08/11/2016   LDLCALC 72 08/11/2016   TRIG 175 (H) 08/11/2016   CHOLHDL 3.1 08/11/2016   Needs to reduce fat in diet    Benign hypertension Controlled, no change in medication DASH diet and commitment to daily physical activity for a minimum of 30 minutes discussed and encouraged, as a part of hypertension management. The importance of attaining a healthy weight is also discussed.  BP/Weight 11/10/2016 11/02/2016 10/20/2016 09/10/2016 08/19/2016 07/31/2016 74/25/9563  Systolic BP 875 643 329 518 841 660 630  Diastolic BP 80 80 80 49 72 66 74  Wt. 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   The need to take medication as prescribed, test blood sugar as directed, and to call between visits if there is a concern that blood sugar is uncontrolled is also discussed.   Nicholas [name] is reminded of  the importance of daily foot exam, annual eye examination, and good blood sugar, blood pressure and cholesterol control.  Diabetic Labs Latest Ref Rng & Units 11/11/2016 08/11/2016 05/06/2016 05/04/2016 01/03/2016  HbA1c <5.7 % 7.0(H) 6.3(H) 5.8(H) - 6.5(H)  Microalbumin <2.0 mg/dL - - - - -  Micro/Creat Ratio 0.0 - 30.0 mg/g - - - - -  Chol <200 mg/dL - 159 - - -  HDL >40 mg/dL - 52 - - -  Calc LDL <100 mg/dL - 72 - - -  Triglycerides <150 mg/dL - 175(H) - - -  Creatinine 0.70 - 1.25 mg/dL  1.18 1.17 1.26(H) 1.12 1.09   BP/Weight 11/10/2016 11/02/2016 10/20/2016 09/10/2016 08/19/2016 07/31/2016 03/55/9741  Systolic BP 638 453 646 803 212 248 250  Diastolic BP 80 80 80 49 72 66 74  Wt. (Lbs) 422 424.04 - 413 418 413 421  BMI 58.86 59.14 - 57.6 55.15 54.49 57.1   Foot/eye exam completion dates Latest Ref Rng & Units 05/29/2016 02/14/2016  Eye Exam No Retinopathy No Retinopathy -  Foot exam Order - - -  Foot Form Completion - - Done        MORBID OBESITY Unchnaged. Patient re-educated about  the importance of commitment to a  minimum of 150 minutes of exercise per week.  The importance of healthy food choices with portion control discussed. Encouraged to start a food diary, count calories and to consider  joining a support group. Sample diet sheets offered. Goals set by the patient for the next several months.   Weight /BMI 11/10/2016 11/02/2016 09/10/2016  WEIGHT 422 lb 424 lb 0.6 oz 413 lb  HEIGHT 5\' 11"  5\' 11"  5\' 11"   BMI 58.86 kg/m2 59.14 kg/m2 57.6 kg/m2

## 2016-11-15 NOTE — Assessment & Plan Note (Signed)
Increased symptoms due to recent losses as to be expected, but coping well, no need for therapy or med change at this time

## 2016-11-15 NOTE — Assessment & Plan Note (Signed)
Unchnaged. Patient re-educated about  the importance of commitment to a  minimum of 150 minutes of exercise per week.  The importance of healthy food choices with portion control discussed. Encouraged to start a food diary, count calories and to consider  joining a support group. Sample diet sheets offered. Goals set by the patient for the next several months.   Weight /BMI 11/10/2016 11/02/2016 09/10/2016  WEIGHT 422 lb 424 lb 0.6 oz 413 lb  HEIGHT 5\' 11"  5\' 11"  5\' 11"   BMI 58.86 kg/m2 59.14 kg/m2 57.6 kg/m2

## 2016-11-15 NOTE — Assessment & Plan Note (Signed)
Hyperlipidemia:Low fat diet discussed and encouraged.   Lipid Panel  Lab Results  Component Value Date   CHOL 159 08/11/2016   HDL 52 08/11/2016   LDLCALC 72 08/11/2016   TRIG 175 (H) 08/11/2016   CHOLHDL 3.1 08/11/2016   Needs to reduce fat in diet

## 2016-11-15 NOTE — Assessment & Plan Note (Signed)
Controlled, no change in medication DASH diet and commitment to daily physical activity for a minimum of 30 minutes discussed and encouraged, as a part of hypertension management. The importance of attaining a healthy weight is also discussed.  BP/Weight 11/10/2016 11/02/2016 10/20/2016 09/10/2016 08/19/2016 07/31/2016 39/08/90  Systolic BP 330 076 226 333 545 625 638  Diastolic BP 80 80 80 49 72 66 74  Wt. (Lbs) 422 424.04 - 413 418 413 421  BMI 58.86 59.14 - 57.6 55.15 54.49 57.1

## 2016-11-15 NOTE — Assessment & Plan Note (Signed)
Controlled, no change in medication Nicholas [name] is reminded of the importance of commitment to daily physical activity for 30 minutes or more, as able and the need to limit carbohydrate intake to 30 to 60 grams per meal to help with blood sugar control.   The need to take medication as prescribed, test blood sugar as directed, and to call between visits if there is a concern that blood sugar is uncontrolled is also discussed.   Nicholas [name] is reminded of the importance of daily foot exam, annual eye examination, and good blood sugar, blood pressure and cholesterol control.  Diabetic Labs Latest Ref Rng & Units 11/11/2016 08/11/2016 05/06/2016 05/04/2016 01/03/2016  HbA1c <5.7 % 7.0(H) 6.3(H) 5.8(H) - 6.5(H)  Microalbumin <2.0 mg/dL - - - - -  Micro/Creat Ratio 0.0 - 30.0 mg/g - - - - -  Chol <200 mg/dL - 159 - - -  HDL >40 mg/dL - 52 - - -  Calc LDL <100 mg/dL - 72 - - -  Triglycerides <150 mg/dL - 175(H) - - -  Creatinine 0.70 - 1.25 mg/dL 1.18 1.17 1.26(H) 1.12 1.09   BP/Weight 11/10/2016 11/02/2016 10/20/2016 09/10/2016 08/19/2016 07/31/2016 79/15/0413  Systolic BP 643 837 793 968 864 847 207  Diastolic BP 80 80 80 49 72 66 74  Wt. (Lbs) 422 424.04 - 413 418 413 421  BMI 58.86 59.14 - 57.6 55.15 54.49 57.1   Foot/eye exam completion dates Latest Ref Rng & Units 05/29/2016 02/14/2016  Eye Exam No Retinopathy No Retinopathy -  Foot exam Order - - -  Foot Form Completion - - Done

## 2016-11-19 ENCOUNTER — Encounter: Payer: Self-pay | Admitting: "Endocrinology

## 2016-11-19 ENCOUNTER — Other Ambulatory Visit: Payer: Self-pay | Admitting: Family Medicine

## 2016-11-19 ENCOUNTER — Ambulatory Visit (INDEPENDENT_AMBULATORY_CARE_PROVIDER_SITE_OTHER): Payer: PPO | Admitting: "Endocrinology

## 2016-11-19 VITALS — BP 132/79 | HR 74 | Ht 71.0 in | Wt >= 6400 oz

## 2016-11-19 DIAGNOSIS — I10 Essential (primary) hypertension: Secondary | ICD-10-CM | POA: Diagnosis not present

## 2016-11-19 DIAGNOSIS — E669 Obesity, unspecified: Secondary | ICD-10-CM

## 2016-11-19 DIAGNOSIS — E1169 Type 2 diabetes mellitus with other specified complication: Secondary | ICD-10-CM

## 2016-11-19 DIAGNOSIS — E782 Mixed hyperlipidemia: Secondary | ICD-10-CM

## 2016-11-19 DIAGNOSIS — E039 Hypothyroidism, unspecified: Secondary | ICD-10-CM | POA: Diagnosis not present

## 2016-11-19 MED ORDER — LEVOTHYROXINE SODIUM 75 MCG PO TABS: 75.0000 ug | ORAL_TABLET | Freq: Every day | ORAL | 3 refills | Status: DC

## 2016-11-19 NOTE — Patient Instructions (Signed)

## 2016-11-19 NOTE — Progress Notes (Signed)
Subjective:    Patient ID: Nicholas [name], male    DOB: 1956-06-02,    Past Medical History:  Diagnosis Date  . Chronic knee pain   . Depression   . Diabetes ([place])   . Glaucoma   . Hyperlipidemia   . Hypertension   . MRSA (methicillin resistant Staphylococcus aureus)   . Poor circulation    leg   . Suicide attempt ([place])   . Testosterone deficiency    Past Surgical History:  Procedure Laterality Date  . CHOLECYSTECTOMY N/A 02/25/2015   Procedure: LAPAROSCOPIC CHOLECYSTECTOMY;  Surgeon: Aviva Signs, MD;  Location: [facility];  Service: General;  Laterality: N/A;  . COLONOSCOPY  03/2008   HYW:VPXTGG external hemorrhoidal tag, otherwise normal rectum/Two diminutive rectosigmoid polyps s/p bx/ Polyp in the opposite the ileocecal valve s/p bx. tubular adenoma.  . COLONOSCOPY N/A 03/23/2014   YIR:SWNIOEVO colon polyps throughout his rectum and colon. Sessile and pedunculated. The largest polyp was proximally 6 mm in dimensions in the retum at 3 cm with adjacent diminutive polyp.  There was also a polyps on ileocecal valve, ascending segmen descending and sigmoid segment. as the colon was tortuous & elongated requiring externa abd pressure and changing of the pts postion to reach cecum  . ESOPHAGOGASTRODUODENOSCOPY N/A 03/23/2014   RMR: 1. Patient had circumferential distal esophageal erosions within 5 mm of the GE junction.  No Barrett's esophagus. Tubular esophagus patent throughout its course. 2.  Diffuse gastric erosions. (1) 31mm area of healing ulceration in the antrum. No ulcer or infiltrating process observed. Patent pylorus. Normal-appearing first and second portion of the duodenum .  . kidney stones     cysto with litholapexy   Social History   Social History  . Marital status: Divorced    Spouse name: N/A  . Number of children: 0  . Years of education: 12   Occupational History  . DISABLED   .  Unemployed   Social History Main Topics  . Smoking status: Former Smoker     Packs/day: 1.50    Years: 25.00    Types: Cigarettes    Quit date: 02/20/2002  . Smokeless tobacco: Former Systems developer    Types: Snuff    Quit date: 11/02/1980     Comment: Quit x 15 years  . Alcohol use No  . Drug use: No  . Sexual activity: Not Currently    Birth control/ protection: None   Other Topics Concern  . None   Social History Narrative  . None   Outpatient Encounter Prescriptions as of 11/19/2016  Medication Sig  . ANDROGEL PUMP 20.25 MG/ACT (1.62%) GEL Apply 1 application topically daily.   Marland Kitchen aspirin EC 81 MG tablet Take 81 mg by mouth daily.  . brimonidine-timolol (COMBIGAN) 0.2-0.5 % ophthalmic solution Place 1 drop into both eyes at bedtime. One drop in each eye two times a day    . citalopram (CELEXA) 40 MG tablet TAKE 1 TABLET BY MOUTH ONCE A DAY.  . cyclobenzaprine (FLEXERIL) 10 MG tablet Take 1 tablet (10 mg total) by mouth 3 (three) times daily. (Patient taking differently: Take 10 mg by mouth 3 (three) times daily as needed for muscle spasms. )  . gabapentin (NEURONTIN) 600 MG tablet TAKE ONE TABLET BY MOUTH 3 TIMES DAILY.  Marland Kitchen HUMULIN R U-500 KWIKPEN 500 UNIT/ML kwikpen INJECT UP TO 100 UNITS SUBCUTANEOUSLY 30 MINUTES BEFORE MEALS. (Patient taking differently: INJECT UP TO 50 UNITS SUBCUTANEOUSLY 30 MINUTES BEFORE MEALS.)  . ketoconazole (  NIZORAL) 2 % shampoo Apply 1 application topically every 30 (thirty) days.   Marland Kitchen lamoTRIgine (LAMICTAL) 200 MG tablet TAKE 1 TABLET BY MOUTH ONCE A DAY.  Marland Kitchen levothyroxine (SYNTHROID, LEVOTHROID) 75 MCG tablet Take 1 tablet (75 mcg total) by mouth daily before breakfast.  . lisinopril (PRINIVIL,ZESTRIL) 5 MG tablet Take 1 tablet (5 mg total) by mouth daily.  . metoprolol succinate (TOPROL-XL) 25 MG 24 hr tablet TAKE ONE TABLET BY MOUTH ONCE DAILY.  . naproxen (NAPROSYN) 500 MG tablet TAKE ONE TABLET BY MOUTH TWICE DAILY WITH A MEAL.  Marland Kitchen NOVOFINE 30G X 8 MM MISC   . pantoprazole (PROTONIX) 40 MG tablet TAKE (1) TABLET BY MOUTH TWICE  DAILY.  . simvastatin (ZOCOR) 40 MG tablet TAKE 1 TABLET BY MOUTH ONCE A DAY.  . traMADol (ULTRAM) 50 MG tablet TAKE 2 TABLETS BY MOUTH EVERY 8 HOURS AS NEEDED.  Marland Kitchen TRAVATAN Z 0.004 % SOLN ophthalmic solution Place 1 drop into both eyes at bedtime.   . triamcinolone ointment (KENALOG) 0.1 % APPLY TO AFFECTED AREAS TWICE DAILY.  Marland Kitchen VICTOZA 18 MG/3ML SOPN INJECT 1.8 MG SUBCUTANEOUSLY ONCE DAILY.  Marland Kitchen Vitamin D, Ergocalciferol, (DRISDOL) 50000 units CAPS capsule TAKE ONE CAPSULE BY MOUTH ONCE WEEKLY.  . [DISCONTINUED] Levothyroxine Sodium 50 MCG CAPS Take 1 capsule (50 mcg total) by mouth daily before breakfast.   No facility-administered encounter medications on file as of 11/19/2016.    ALLERGIES: Allergies  Allergen Reactions  . Dilaudid [Hydromorphone Hcl] Shortness Of Breath   VACCINATION STATUS: Immunization History  Administered Date(s) Administered  . Influenza Whole 04/01/2010  . Influenza,inj,Quad PF,36+ Mos 03/08/2013  . Pneumococcal Conjugate-13 07/09/2014  . Pneumococcal Polysaccharide-23 04/01/2010, 10/07/2015  . Tdap 11/24/2011  . Zoster 12/12/2014    Diabetes  He presents for his follow-up diabetic visit. He has type 2 diabetes mellitus. His disease course has been stable. There are no hypoglycemic associated symptoms. Pertinent negatives for hypoglycemia include no confusion, headaches, pallor or seizures. There are no diabetic associated symptoms. Pertinent negatives for diabetes include no chest pain, no fatigue, no polydipsia, no polyphagia, no polyuria and no weakness. There are no hypoglycemic complications. Symptoms are stable. There are no diabetic complications. He is compliant with treatment most of the time. His weight is increasing steadily. He is following a generally unhealthy diet. He rarely participates in exercise. His home blood glucose trend is decreasing steadily. His breakfast blood glucose range is generally 130-140 mg/dl. His lunch blood glucose range is  generally 130-140 mg/dl. His dinner blood glucose range is generally 130-140 mg/dl. His overall blood glucose range is 130-140 mg/dl. An ACE inhibitor/angiotensin II receptor blocker is being taken.  Hyperlipidemia  This is a chronic problem. The current episode started more than 1 year ago. Exacerbating diseases include diabetes and obesity. Pertinent negatives include no chest pain, myalgias or shortness of breath. Current antihyperlipidemic treatment includes statins. Risk factors for coronary artery disease include a sedentary lifestyle, male sex, diabetes mellitus, dyslipidemia and obesity.     Review of Systems  Constitutional: Negative for fatigue and unexpected weight change.  HENT: Negative for dental problem, mouth sores and trouble swallowing.   Eyes: Negative for visual disturbance.  Respiratory: Negative for cough, choking, chest tightness, shortness of breath and wheezing.   Cardiovascular: Negative for chest pain, palpitations and leg swelling.  Gastrointestinal: Negative for abdominal distention, abdominal pain, constipation, diarrhea, nausea and vomiting.  Endocrine: Negative for polydipsia, polyphagia and polyuria.  Genitourinary: Negative for dysuria, flank pain, hematuria  and urgency.  Musculoskeletal: Negative for back pain, gait problem, myalgias and neck pain.  Skin: Negative for pallor, rash and wound.  Neurological: Negative for seizures, syncope, weakness, numbness and headaches.  Psychiatric/Behavioral: Negative.  Negative for confusion and dysphoric mood.    Objective:    BP 132/79   Pulse 74   Ht 5\' 11"  (1.803 m)   Wt (!) 431 lb (195.5 kg)   BMI 60.11 kg/m   Wt Readings from Last 3 Encounters:  11/19/16 (!) 431 lb (195.5 kg)  11/10/16 (!) 422 lb (191.4 kg)  11/02/16 (!) 424 lb 0.6 oz (192.3 kg)    Physical Exam  Constitutional: He is oriented to person, place, and time. He appears well-developed and well-nourished. He is cooperative. No distress.   HENT:  Head: Normocephalic and atraumatic.  Eyes: EOM are normal.  Neck: Normal range of motion. Neck supple. No tracheal deviation present. No thyromegaly present.  Cardiovascular: Normal rate, S1 normal, S2 normal and normal heart sounds.  Exam reveals no gallop.   No murmur heard. Pulses:      Dorsalis pedis pulses are 1+ on the right side, and 1+ on the left side.       Posterior tibial pulses are 1+ on the right side, and 1+ on the left side.  Pulmonary/Chest: Breath sounds normal. No respiratory distress. He has no wheezes.  Abdominal: Soft. Bowel sounds are normal. He exhibits no distension. There is no tenderness. There is no guarding and no CVA tenderness.  Musculoskeletal: He exhibits no edema.       Right shoulder: He exhibits no swelling and no deformity.  Large lower extremities with skin dryness and lateral eczema on the left lower leg.  Neurological: He is alert and oriented to person, place, and time. He has normal strength and normal reflexes. No cranial nerve deficit or sensory deficit. Gait normal.  Skin: Skin is warm and dry. No rash noted. No cyanosis. Nails show no clubbing.  He has extensive tattoos.  Psychiatric: He has a normal mood and affect. His speech is normal and behavior is normal. Judgment and thought content normal. Cognition and memory are normal.    Results for orders placed or performed in visit on 11/11/16  Comprehensive metabolic panel  Result Value Ref Range   Sodium 139 135 - 146 mmol/L   Potassium 4.6 3.5 - 5.3 mmol/L   Chloride 105 98 - 110 mmol/L   CO2 22 20 - 31 mmol/L   Glucose, Bld 201 (H) 65 - 99 mg/dL   BUN 28 (H) 7 - 25 mg/dL   Creat 1.18 0.70 - 1.25 mg/dL   Total Bilirubin 0.3 0.2 - 1.2 mg/dL   Alkaline Phosphatase 72 40 - 115 U/L   AST 15 10 - 35 U/L   ALT 15 9 - 46 U/L   Total Protein 6.3 6.1 - 8.1 g/dL   Albumin 3.9 3.6 - 5.1 g/dL   Calcium 9.5 8.6 - 10.3 mg/dL  TSH  Result Value Ref Range   TSH 2.14 0.40 - 4.50 mIU/L   T4, free  Result Value Ref Range   Free T4 1.1 0.8 - 1.8 ng/dL  Hemoglobin A1c  Result Value Ref Range   Hgb A1c MFr Bld 7.0 (H) <5.7 %   Mean Plasma Glucose 154 mg/dL  Diabetic Labs (most recent): Lab Results  Component Value Date   HGBA1C 7.0 (H) 11/11/2016   HGBA1C 6.3 (H) 08/11/2016   HGBA1C 5.8 (H) 05/06/2016   Lipid  Panel     Component Value Date/Time   CHOL 159 08/11/2016 0748   TRIG 175 (H) 08/11/2016 0748   HDL 52 08/11/2016 0748   CHOLHDL 3.1 08/11/2016 0748   VLDL 35 (H) 08/11/2016 0748   LDLCALC 72 08/11/2016 0748    Assessment & Plan:   1. 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  Patient came with Improved glycemic profile on U500 insulin taking 80 units 3 times a day before meals. His recent A1c is stable at 7% , generally improving from 9 %.   -He did  Not have   major hypoglycemia , did have some rare random readings in the 60s.  - Patient remains at a high risk for more acute and chronic complications of diabetes which include CAD, CVA, CKD, retinopathy, and neuropathy. These are all discussed in detail with the patient.  - I have re-counseled the patient on diet management and weight loss  by adopting a carbohydrate restricted / protein rich  Diet. - Patient is advised to stick to a routine mealtimes to eat 3 meals  a day and avoid unnecessary snacks ( to snack only to correct hypoglycemia).  - Suggestion is made for patient to avoid simple carbohydrates   from their diet including Cakes , Desserts, Ice Cream,  Soda (  diet and regular) , Sweet Tea , Candies,  Chips, Cookies, Artificial Sweeteners,   and "Sugar-free" Products .  This will help patient to have stable blood glucose profile and potentially avoid unintended  Weight gain.  - The patient  has been  scheduled with Jearld Fenton, RDN, CDE for individualized DM education. - I have approached patient with the  following individualized plan to manage diabetes and patient agrees.  - I will continue Humulin U500   50 units 3 times a day before meals for pre-meal blood glucose above 90 mg/dL associated with strict monitoring of glucose  AC and HS. - Patient is warned not to take insulin without proper monitoring per orders.  -Patient is encouraged to call clinic for blood glucose levels less than 70 or above 300 mg /dl. - I will continue Victoza 1.8 mg subcutaneous daily, therapeutically suitable for patient.. -Patient does not tolerate metformin.   - Patient specific target  for A1c; LDL, HDL, Triglycerides, and  Waist Circumference were discussed in detail.  2) BP/HTN: Controlled. Continue current medications including ACEI. 3) Lipids/HPL:  continue statins. 4)  Weight/Diet: CDE consult has been initiated, exercise, and carbohydrates information provided. He recently lost 37 pounds mainly due to gastroenteritis.  5) hypothyroidism:   he will benefit from slight increase in his levothyroxine dose. I will prescribe levothyroxine 75 g by mouth every morning before breakfast.  - We discussed about correct intake of levothyroxine, at fasting, with water, separated by at least 30 minutes from breakfast, and separated by more than 4 hours from calcium, iron, multivitamins, acid reflux medications (PPIs). -Patient is made aware of the fact that thyroid hormone replacement is needed for life, dose to be adjusted by periodic monitoring of thyroid function tests.   6) Chronic Care/Health Maintenance:  -Patient is on ACEI and Statin medications and encouraged to continue to follow up with Ophthalmology, Podiatrist at least yearly or according to recommendations, and advised to  stay away from smoking. I have recommended yearly flu vaccine and pneumonia vaccination at least every 5 years; moderate intensity exercise for up  to 150 minutes weekly; and  sleep for at least 7 hours a day. I refilled his Kenalog  ointment given to him for bilateral lower extremity dryness and eczema  .  I advised patient to maintain close follow up with their PCP for primary care needs.  Patient is asked to bring meter and  blood glucose logs during their next visit.   Follow up plan: Return in about 3 months (around 02/19/2017) for follow up with pre-visit labs, meter, and logs.  Glade Lloyd, MD Phone: [phone]  Fax: [phone]   11/19/2016, 11:32 AM

## 2016-11-20 ENCOUNTER — Ambulatory Visit: Payer: PPO | Admitting: Urology

## 2016-11-24 ENCOUNTER — Ambulatory Visit: Payer: PPO | Admitting: Sports Medicine

## 2016-11-28 ENCOUNTER — Other Ambulatory Visit: Payer: Self-pay | Admitting: Family Medicine

## 2016-12-01 ENCOUNTER — Ambulatory Visit (INDEPENDENT_AMBULATORY_CARE_PROVIDER_SITE_OTHER): Payer: PPO | Admitting: Sports Medicine

## 2016-12-01 DIAGNOSIS — B351 Tinea unguium: Secondary | ICD-10-CM

## 2016-12-01 DIAGNOSIS — E11610 Type 2 diabetes mellitus with diabetic neuropathic arthropathy: Secondary | ICD-10-CM

## 2016-12-01 DIAGNOSIS — L97511 Non-pressure chronic ulcer of other part of right foot limited to breakdown of skin: Secondary | ICD-10-CM

## 2016-12-01 DIAGNOSIS — E1149 Type 2 diabetes mellitus with other diabetic neurological complication: Secondary | ICD-10-CM

## 2016-12-01 DIAGNOSIS — I739 Peripheral vascular disease, unspecified: Secondary | ICD-10-CM

## 2016-12-01 NOTE — Progress Notes (Signed)
Patient ID: Nicholas [name], male   DOB: July 22, 1955, 61 y.o.   MRN: [id number]  Subjective: Nicholas [name] is a 61 y.o. Diabetic male patient seen in office for Diabetic ulcer check, nail trim and for brace check; Reports that he still hasnt gotten the brace to work out for him. Reports has been using betadine to right 1st toe with improvement with no drainage. Admits that he did not go to pain management because of location. Denies any other symptoms.    Patient Active Problem List   Diagnosis Date Noted  . Hypothyroidism 08/19/2016  . GERD (gastroesophageal reflux disease) 04/14/2016  . Charcot foot due to diabetes mellitus ([place]) 01/28/2016  . Benign essential tremor 01/28/2016  . Skin lesion of back 10/07/2015  . Back pain with left-sided radiculopathy 06/05/2015  . Hyperlipidemia 04/10/2015  . Abdominal wall bulge 01/15/2015  . Hx of adenomatous colonic polyps 02/21/2014  . Allergic rhinitis 07/24/2013  . Onychomycosis 05/21/2013  . 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 Back pain with radiation 04/20/2011  . Seborrheic dermatitis 11/20/2010  . Dermatomycosis 11/20/2010  . FATIGUE 04/01/2010  . CARPAL TUNNEL SYNDROME, BILATERAL 03/24/2010  . Hypogonadism in male 01/27/2010  . DEPRESSION/ANXIETY 01/27/2010  . CHF 01/13/2010  . DYSPNEA 01/13/2010  . Diabetes mellitus type 2 in obese ([place]) 12/12/2009  . MORBID OBESITY 12/12/2009  . Benign hypertension 12/12/2009  . PVD (peripheral vascular disease) ([place]) 12/12/2009   Current Outpatient Prescriptions on File Prior to Visit  Medication Sig Dispense Refill  . ANDROGEL PUMP 20.25 MG/ACT (1.62%) GEL Apply 1 application topically daily.     Marland Kitchen aspirin EC 81 MG tablet Take 81 mg by mouth daily.    . brimonidine-timolol (COMBIGAN) 0.2-0.5 % ophthalmic solution Place 1 drop into both eyes at bedtime. One drop in each eye two times a day      . citalopram (CELEXA) 40 MG tablet TAKE 1 TABLET BY MOUTH ONCE  A DAY. 90 tablet 1  . cyclobenzaprine (FLEXERIL) 10 MG tablet Take 1 tablet (10 mg total) by mouth 3 (three) times daily. (Patient taking differently: Take 10 mg by mouth 3 (three) times daily as needed for muscle spasms. ) 20 tablet 0  . gabapentin (NEURONTIN) 600 MG tablet TAKE ONE TABLET BY MOUTH 3 TIMES DAILY. 270 tablet 1  . HUMULIN R U-500 KWIKPEN 500 UNIT/ML kwikpen INJECT UP TO 100 UNITS SUBCUTANEOUSLY 30 MINUTES BEFORE MEALS. (Patient taking differently: INJECT UP TO 50 UNITS SUBCUTANEOUSLY 30 MINUTES BEFORE MEALS.) 18 mL 1  . ketoconazole (NIZORAL) 2 % shampoo Apply 1 application topically every 30 (thirty) days.     Marland Kitchen lamoTRIgine (LAMICTAL) 200 MG tablet TAKE 1 TABLET BY MOUTH ONCE A DAY. 90 tablet 1  . levothyroxine (SYNTHROID, LEVOTHROID) 75 MCG tablet Take 1 tablet (75 mcg total) by mouth daily before breakfast. 30 tablet 3  . lisinopril (PRINIVIL,ZESTRIL) 5 MG tablet Take 1 tablet (5 mg total) by mouth daily. 30 tablet 3  . metoprolol succinate (TOPROL-XL) 25 MG 24 hr tablet TAKE ONE TABLET BY MOUTH ONCE DAILY. 90 tablet 1  . naproxen (NAPROSYN) 500 MG tablet TAKE ONE TABLET BY MOUTH TWICE DAILY WITH A MEAL. 60 tablet 5  . NOVOFINE 30G X 8 MM MISC     . pantoprazole (PROTONIX) 40 MG tablet TAKE (1) TABLET BY MOUTH TWICE DAILY. 60 tablet 3  . simvastatin (ZOCOR) 40 MG tablet TAKE 1 TABLET BY MOUTH ONCE A DAY. 90 tablet 1  .  traMADol (ULTRAM) 50 MG tablet TAKE 2 TABLETS BY MOUTH EVERY 8 HOURS AS NEEDED. 180 tablet 0  . TRAVATAN Z 0.004 % SOLN ophthalmic solution Place 1 drop into both eyes at bedtime.     . triamcinolone ointment (KENALOG) 0.1 % APPLY TO AFFECTED AREAS TWICE DAILY. 454 g 0  . VICTOZA 18 MG/3ML SOPN INJECT 1.8 MG SUBCUTANEOUSLY ONCE DAILY. 9 mL 2  . Vitamin D, Ergocalciferol, (DRISDOL) 50000 units CAPS capsule TAKE ONE CAPSULE BY MOUTH ONCE WEEKLY. 12 capsule 0   No current facility-administered medications on file prior to visit.    Allergies  Allergen Reactions   . Dilaudid [Hydromorphone Hcl] Shortness Of Breath    Recent Results (from the past 2160 hour(s))  Comprehensive metabolic panel     Status: Abnormal   Collection Time: 11/11/16 10:57 AM  Result Value Ref Range   Sodium 139 135 - 146 mmol/L   Potassium 4.6 3.5 - 5.3 mmol/L   Chloride 105 98 - 110 mmol/L   CO2 22 20 - 31 mmol/L   Glucose, Bld 201 (H) 65 - 99 mg/dL   BUN 28 (H) 7 - 25 mg/dL   Creat 1.18 0.70 - 1.25 mg/dL    Comment:   For patients > or = 62 years of age: The upper reference limit for Creatinine is approximately 13% higher for people identified as African-American.      Total Bilirubin 0.3 0.2 - 1.2 mg/dL   Alkaline Phosphatase 72 40 - 115 U/L   AST 15 10 - 35 U/L   ALT 15 9 - 46 U/L   Total Protein 6.3 6.1 - 8.1 g/dL   Albumin 3.9 3.6 - 5.1 g/dL   Calcium 9.5 8.6 - 10.3 mg/dL  TSH     Status: None   Collection Time: 11/11/16 10:57 AM  Result Value Ref Range   TSH 2.14 0.40 - 4.50 mIU/L  T4, free     Status: None   Collection Time: 11/11/16 10:57 AM  Result Value Ref Range   Free T4 1.1 0.8 - 1.8 ng/dL  Hemoglobin A1c     Status: Abnormal   Collection Time: 11/11/16 10:57 AM  Result Value Ref Range   Hgb A1c MFr Bld 7.0 (H) <5.7 %    Comment:   For someone without known diabetes, a hemoglobin A1c value of 6.5% or greater indicates that they may have diabetes and this should be confirmed with a follow-up test.   For someone with known diabetes, a value <7% indicates that their diabetes is well controlled and a value greater than or equal to 7% indicates suboptimal control. A1c targets should be individualized based on duration of diabetes, age, comorbid conditions, and other considerations.   Currently, no consensus exists for use of hemoglobin A1c for diagnosis of diabetes for children.      Mean Plasma Glucose 154 mg/dL    Objective: There were no vitals filed for this visit.  General: Patient is awake, alert, oriented x 3 and in no acute  distress.  Dermatology: Skin is warm and dry bilateral with a healed ulceration at right hallux with reactive keratosis with no signs of acute infection. Nails are long and thick with no signs of ingrowing or infection.    Vascular: Dorsalis Pedis pulse = 0/4 Bilateral,  Posterior Tibial pulse = 1/4 Bilateral faint,  Capillary Fill Time < 5 seconds, + chronic venous skin changes bilateral.  Neurologic:Protective sensation diminished using the 5.07/10g Semmes Weinstein Monofilament. Vibratory diminished  bilateral.   Musculosketal:  No pain to dorsal midfoot on left with accompanying prominent bones supportive of Charcot. No Pain with palpation to healed ulcerated area at right hallux. + Hallux interphalangeus R>L hallux, No pain with compression to calves bilateral.   Assessment and Plan:  Problem List Items Addressed This Visit      Cardiovascular and Mediastinum   PVD (peripheral vascular disease) ([place])     Endocrine   Charcot foot due to diabetes mellitus ([place])    Other Visit Diagnoses    Dermatophytosis of nail    -  Primary   Type II diabetes mellitus with neurological manifestations ([place])       Toe ulcer, right, limited to breakdown of skin ([place])       prematurely healed     -Examined patient  -Nails debrided using sterile nail nipper without incident -Reactive callus parred at right hallux using sterile chisel blade with no opening; Dispensed protective to padding to use daily to prevent reulceration at right hallux  -Patient awaiting brace remake and shoe  -Patient did not go to pain management states that location was too far  -Continue with gabapentin  -Patient to return to office for continued diabetic nail care and final check on healed right 1st toe ulcer sooner if problems arise.  Landis Martins, DPM

## 2016-12-03 ENCOUNTER — Encounter: Payer: PPO | Admitting: Physical Medicine & Rehabilitation

## 2016-12-05 ENCOUNTER — Other Ambulatory Visit: Payer: Self-pay | Admitting: "Endocrinology

## 2016-12-29 ENCOUNTER — Other Ambulatory Visit: Payer: PPO | Admitting: Orthotics

## 2016-12-29 ENCOUNTER — Other Ambulatory Visit: Payer: Self-pay | Admitting: "Endocrinology

## 2017-01-07 ENCOUNTER — Other Ambulatory Visit: Payer: Self-pay | Admitting: Family Medicine

## 2017-01-11 NOTE — Telephone Encounter (Signed)
Seen 5 15 18 

## 2017-01-12 ENCOUNTER — Other Ambulatory Visit: Payer: PPO | Admitting: Orthotics

## 2017-01-15 ENCOUNTER — Ambulatory Visit (INDEPENDENT_AMBULATORY_CARE_PROVIDER_SITE_OTHER): Payer: PPO | Admitting: Urology

## 2017-01-15 DIAGNOSIS — R6882 Decreased libido: Secondary | ICD-10-CM | POA: Diagnosis not present

## 2017-01-15 DIAGNOSIS — E291 Testicular hypofunction: Secondary | ICD-10-CM

## 2017-01-16 ENCOUNTER — Other Ambulatory Visit: Payer: Self-pay | Admitting: Family Medicine

## 2017-01-18 NOTE — Telephone Encounter (Signed)
seen 5 15 18

## 2017-01-19 DIAGNOSIS — E1165 Type 2 diabetes mellitus with hyperglycemia: Secondary | ICD-10-CM | POA: Diagnosis not present

## 2017-02-01 ENCOUNTER — Telehealth: Payer: Self-pay | Admitting: Sports Medicine

## 2017-02-01 NOTE — Telephone Encounter (Signed)
I'm calling because my foot has been swollen and very painful. It is now keeping me awake at night. In the past she offered to refer me to pain management but I do not want to go to pain management. I don't know if she has any suggestions of what I can do or if she wants me to come in. Please call me back at [phone].

## 2017-02-01 NOTE — Telephone Encounter (Signed)
I told pt he would benefit from being evaluated by a Doctor, and transferred to schedulers, but I had to leave a message for scheduler to call him and get him in this week. I also routed a message to Schedulers to call for an appt this week.

## 2017-02-02 ENCOUNTER — Ambulatory Visit (INDEPENDENT_AMBULATORY_CARE_PROVIDER_SITE_OTHER): Payer: PPO | Admitting: Sports Medicine

## 2017-02-02 DIAGNOSIS — E1149 Type 2 diabetes mellitus with other diabetic neurological complication: Secondary | ICD-10-CM | POA: Diagnosis not present

## 2017-02-02 DIAGNOSIS — E11610 Type 2 diabetes mellitus with diabetic neuropathic arthropathy: Secondary | ICD-10-CM

## 2017-02-02 DIAGNOSIS — Q828 Other specified congenital malformations of skin: Secondary | ICD-10-CM

## 2017-02-02 DIAGNOSIS — E1140 Type 2 diabetes mellitus with diabetic neuropathy, unspecified: Secondary | ICD-10-CM

## 2017-02-02 NOTE — Progress Notes (Addendum)
Patient ID: Nicholas [name], male   DOB: 08-01-1955, 61 y.o.   MRN: [id number]  Subjective: Nicholas [name] is a 61 y.o. Diabetic male patient seen in office for callus at right 1st toe and pain at left foot. Admits that he did not go to pain management because he doesn't want to. Still awaiting brace for left. Denies any other symptoms.    Patient Active Problem List   Diagnosis Date Noted  . Hypothyroidism 08/19/2016  . GERD (gastroesophageal reflux disease) 04/14/2016  . Charcot foot due to diabetes mellitus ([place]) 01/28/2016  . Benign essential tremor 01/28/2016  . Skin lesion of back 10/07/2015  . Back pain with left-sided radiculopathy 06/05/2015  . Hyperlipidemia 04/10/2015  . Abdominal wall bulge 01/15/2015  . Hx of adenomatous colonic polyps 02/21/2014  . Allergic rhinitis 07/24/2013  . Onychomycosis 05/21/2013  . 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 Back pain with radiation 04/20/2011  . Seborrheic dermatitis 11/20/2010  . Dermatomycosis 11/20/2010  . FATIGUE 04/01/2010  . CARPAL TUNNEL SYNDROME, BILATERAL 03/24/2010  . Hypogonadism in male 01/27/2010  . DEPRESSION/ANXIETY 01/27/2010  . CHF 01/13/2010  . DYSPNEA 01/13/2010  . Diabetes mellitus type 2 in obese ([place]) 12/12/2009  . MORBID OBESITY 12/12/2009  . Benign hypertension 12/12/2009  . PVD (peripheral vascular disease) ([place]) 12/12/2009   Current Outpatient Prescriptions on File Prior to Visit  Medication Sig Dispense Refill  . ANDROGEL PUMP 20.25 MG/ACT (1.62%) GEL Apply 1 application topically daily.     Marland Kitchen aspirin EC 81 MG tablet Take 81 mg by mouth daily.    . brimonidine-timolol (COMBIGAN) 0.2-0.5 % ophthalmic solution Place 1 drop into both eyes at bedtime. One drop in each eye two times a day      . citalopram (CELEXA) 40 MG tablet TAKE 1 TABLET BY MOUTH ONCE A DAY. 90 tablet 1  . cyclobenzaprine (FLEXERIL) 10 MG tablet Take 1 tablet (10 mg total) by mouth 3 (three) times  daily. (Patient taking differently: Take 10 mg by mouth 3 (three) times daily as needed for muscle spasms. ) 20 tablet 0  . gabapentin (NEURONTIN) 600 MG tablet TAKE ONE TABLET BY MOUTH 3 TIMES DAILY. 270 tablet 1  . HUMULIN R U-500 KWIKPEN 500 UNIT/ML kwikpen INJECT UP TO 100 UNITS SUBCUTANEOUSLY 30 MINUTES BEFORE MEALS. 18 mL 0  . ketoconazole (NIZORAL) 2 % shampoo Apply 1 application topically every 30 (thirty) days.     Marland Kitchen lamoTRIgine (LAMICTAL) 200 MG tablet TAKE 1 TABLET BY MOUTH ONCE A DAY. 90 tablet 1  . levothyroxine (SYNTHROID, LEVOTHROID) 75 MCG tablet Take 1 tablet (75 mcg total) by mouth daily before breakfast. 30 tablet 3  . lisinopril (PRINIVIL,ZESTRIL) 5 MG tablet TAKE 1 TABLET BY MOUTH ONCE A DAY. 90 tablet 1  . metoprolol succinate (TOPROL-XL) 25 MG 24 hr tablet TAKE ONE TABLET BY MOUTH ONCE DAILY. 90 tablet 1  . naproxen (NAPROSYN) 500 MG tablet TAKE ONE TABLET BY MOUTH TWICE DAILY WITH A MEAL. 60 tablet 5  . NOVOFINE 30G X 8 MM MISC     . pantoprazole (PROTONIX) 40 MG tablet TAKE (1) TABLET BY MOUTH TWICE DAILY. 60 tablet 3  . simvastatin (ZOCOR) 40 MG tablet TAKE 1 TABLET BY MOUTH ONCE A DAY. 90 tablet 1  . traMADol (ULTRAM) 50 MG tablet TAKE 2 TABLETS BY MOUTH EVERY 8 HOURS AS NEEDED. 180 tablet 0  . TRAVATAN Z 0.004 % SOLN ophthalmic solution Place 1 drop into both  eyes at bedtime.     . triamcinolone ointment (KENALOG) 0.1 % APPLY TO AFFECTED AREAS TWICE DAILY. 454 g 0  . VICTOZA 18 MG/3ML SOPN INJECT 1.8 MG SUBCUTANEOUSLY ONCE DAILY. 9 mL 2  . Vitamin D, Ergocalciferol, (DRISDOL) 50000 units CAPS capsule TAKE ONE CAPSULE BY MOUTH ONCE WEEKLY. 12 capsule 0   No current facility-administered medications on file prior to visit.    Allergies  Allergen Reactions  . Dilaudid [Hydromorphone Hcl] Shortness Of Breath    Recent Results (from the past 2160 hour(s))  Comprehensive metabolic panel     Status: Abnormal   Collection Time: 11/11/16 10:57 AM  Result Value Ref Range    Sodium 139 135 - 146 mmol/L   Potassium 4.6 3.5 - 5.3 mmol/L   Chloride 105 98 - 110 mmol/L   CO2 22 20 - 31 mmol/L   Glucose, Bld 201 (H) 65 - 99 mg/dL   BUN 28 (H) 7 - 25 mg/dL   Creat 1.18 0.70 - 1.25 mg/dL    Comment:   For patients > or = 62 years of age: The upper reference limit for Creatinine is approximately 13% higher for people identified as African-American.      Total Bilirubin 0.3 0.2 - 1.2 mg/dL   Alkaline Phosphatase 72 40 - 115 U/L   AST 15 10 - 35 U/L   ALT 15 9 - 46 U/L   Total Protein 6.3 6.1 - 8.1 g/dL   Albumin 3.9 3.6 - 5.1 g/dL   Calcium 9.5 8.6 - 10.3 mg/dL  TSH     Status: None   Collection Time: 11/11/16 10:57 AM  Result Value Ref Range   TSH 2.14 0.40 - 4.50 mIU/L  T4, free     Status: None   Collection Time: 11/11/16 10:57 AM  Result Value Ref Range   Free T4 1.1 0.8 - 1.8 ng/dL  Hemoglobin A1c     Status: Abnormal   Collection Time: 11/11/16 10:57 AM  Result Value Ref Range   Hgb A1c MFr Bld 7.0 (H) <5.7 %    Comment:   For someone without known diabetes, a hemoglobin A1c value of 6.5% or greater indicates that they may have diabetes and this should be confirmed with a follow-up test.   For someone with known diabetes, a value <7% indicates that their diabetes is well controlled and a value greater than or equal to 7% indicates suboptimal control. A1c targets should be individualized based on duration of diabetes, age, comorbid conditions, and other considerations.   Currently, no consensus exists for use of hemoglobin A1c for diagnosis of diabetes for children.      Mean Plasma Glucose 154 mg/dL    Objective: There were no vitals filed for this visit.  General: Patient is awake, alert, oriented x 3 and in no acute distress.  Dermatology: Skin is warm and dry bilateral with a healed ulceration at right hallux with reactive keratosis with no signs of acute infection now porokeratosis at site. Nails are short and thick with no  signs of ingrowing or infection.    Vascular: Dorsalis Pedis pulse = 0/4 Bilateral,  Posterior Tibial pulse = 1/4 Bilateral faint,  Capillary Fill Time < 5 seconds, + chronic venous skin changes bilateral.  Neurologic:Protective sensation diminished using the 5.07/10g Semmes Weinstein Monofilament. Vibratory diminished bilateral.   Musculosketal:  Subjective pain to plantar and dorsal midfoot on left with accompanying prominent bones supportive of Charcot. No Pain with palpation to healed ulcerated  area at right hallux. + Hallux interphalangeus R>L hallux, No pain with compression to calves bilateral.   Assessment and Plan:  Problem List Items Addressed This Visit      Endocrine   Charcot foot due to diabetes mellitus ([place])    Other Visit Diagnoses    Porokeratosis    -  Primary   Type II diabetes mellitus with neurological manifestations ([place])       Diabetic neuropathy with neurologic complication ([place])         -Examined patient  -Reactive callus parred at right hallux using sterile chisel blade with no opening; Continue with protective to padding to use daily to prevent reulceration at right hallux  -After oral consent and aseptic prep, injected a mixture containing 1 ml of 2%  plain lidocaine, 1 ml 0.5% plain marcaine, and 0.5 ml of dexamethasone phosphate into left PT nerve area to see if this will help with pain without complication. Post-injection care discussed with patient.  -Patient did not go to pain management states that he doesn't want to; I advised patient that there is nothing more I can do for PAIN that is chronic  -Continue with gabapentin  -Patient was dispensed brace and shoes that was sent back; patient met with Liliane Channel at today's visit -Patient to return to office as needed continued diabetic nail care and callus care or sooner if problems arise.  Landis Martins, DPM

## 2017-02-13 ENCOUNTER — Other Ambulatory Visit: Payer: Self-pay | Admitting: Family Medicine

## 2017-02-13 ENCOUNTER — Other Ambulatory Visit: Payer: Self-pay | Admitting: "Endocrinology

## 2017-02-15 NOTE — Telephone Encounter (Signed)
Seen 5 15 18 

## 2017-02-16 ENCOUNTER — Other Ambulatory Visit: Payer: Self-pay | Admitting: "Endocrinology

## 2017-02-16 ENCOUNTER — Other Ambulatory Visit: Payer: Self-pay | Admitting: Family Medicine

## 2017-02-16 NOTE — Telephone Encounter (Signed)
Seen 5 15 18 

## 2017-02-22 ENCOUNTER — Other Ambulatory Visit: Payer: Self-pay | Admitting: "Endocrinology

## 2017-02-22 DIAGNOSIS — E1169 Type 2 diabetes mellitus with other specified complication: Secondary | ICD-10-CM | POA: Diagnosis not present

## 2017-02-22 DIAGNOSIS — E669 Obesity, unspecified: Secondary | ICD-10-CM | POA: Diagnosis not present

## 2017-02-22 LAB — COMPREHENSIVE METABOLIC PANEL
ALT: 17 U/L (ref 9–46)
AST: 17 U/L (ref 10–35)
Albumin: 3.9 g/dL (ref 3.6–5.1)
Alkaline Phosphatase: 78 U/L (ref 40–115)
BUN: 22 mg/dL (ref 7–25)
CALCIUM: 9 mg/dL (ref 8.6–10.3)
CO2: 28 mmol/L (ref 20–32)
Chloride: 104 mmol/L (ref 98–110)
Creat: 1.25 mg/dL (ref 0.70–1.25)
GLUCOSE: 178 mg/dL — AB (ref 65–99)
POTASSIUM: 5.2 mmol/L (ref 3.5–5.3)
Sodium: 138 mmol/L (ref 135–146)
Total Bilirubin: 0.5 mg/dL (ref 0.2–1.2)
Total Protein: 6.2 g/dL (ref 6.1–8.1)

## 2017-02-23 LAB — HEMOGLOBIN A1C
Hgb A1c MFr Bld: 8 % — ABNORMAL HIGH (ref <5.7)
Mean Plasma Glucose: 183 mg/dL

## 2017-03-02 ENCOUNTER — Ambulatory Visit (INDEPENDENT_AMBULATORY_CARE_PROVIDER_SITE_OTHER): Payer: PPO | Admitting: "Endocrinology

## 2017-03-02 ENCOUNTER — Encounter: Payer: Self-pay | Admitting: "Endocrinology

## 2017-03-02 VITALS — BP 141/70 | HR 85 | Ht 71.0 in | Wt >= 6400 oz

## 2017-03-02 DIAGNOSIS — E1169 Type 2 diabetes mellitus with other specified complication: Secondary | ICD-10-CM | POA: Diagnosis not present

## 2017-03-02 DIAGNOSIS — I10 Essential (primary) hypertension: Secondary | ICD-10-CM

## 2017-03-02 DIAGNOSIS — E039 Hypothyroidism, unspecified: Secondary | ICD-10-CM | POA: Diagnosis not present

## 2017-03-02 DIAGNOSIS — E669 Obesity, unspecified: Secondary | ICD-10-CM

## 2017-03-02 DIAGNOSIS — E782 Mixed hyperlipidemia: Secondary | ICD-10-CM

## 2017-03-02 MED ORDER — LEVOTHYROXINE SODIUM 88 MCG PO TABS: 88.0000 ug | ORAL_TABLET | Freq: Every day | ORAL | 3 refills | Status: DC

## 2017-03-02 NOTE — Patient Instructions (Signed)

## 2017-03-02 NOTE — Progress Notes (Signed)
Subjective:    Patient ID: Nicholas [name], male    DOB: 08-19-1955,    Past Medical History:  Diagnosis Date  . Chronic knee pain   . Depression   . Diabetes ([place])   . Glaucoma   . Hyperlipidemia   . Hypertension   . MRSA (methicillin resistant Staphylococcus aureus)   . Poor circulation    leg   . Suicide attempt ([place])   . Testosterone deficiency    Past Surgical History:  Procedure Laterality Date  . CHOLECYSTECTOMY N/A 02/25/2015   Procedure: LAPAROSCOPIC CHOLECYSTECTOMY;  Surgeon: Aviva Signs, MD;  Location: [facility];  Service: General;  Laterality: N/A;  . COLONOSCOPY  03/2008   RWE:RXVQMG external hemorrhoidal tag, otherwise normal rectum/Two diminutive rectosigmoid polyps s/p bx/ Polyp in the opposite the ileocecal valve s/p bx. tubular adenoma.  . COLONOSCOPY N/A 03/23/2014   QQP:[id number] colon polyps throughout his rectum and colon. Sessile and pedunculated. The largest polyp was proximally 6 mm in dimensions in the retum at 3 cm with adjacent diminutive polyp.  There was also a polyps on ileocecal valve, ascending segmen descending and sigmoid segment. as the colon was tortuous & elongated requiring externa abd pressure and changing of the pts postion to reach cecum  . ESOPHAGOGASTRODUODENOSCOPY N/A 03/23/2014   RMR: 1. Patient had circumferential distal esophageal erosions within 5 mm of the GE junction.  No Barrett's esophagus. Tubular esophagus patent throughout its course. 2.  Diffuse gastric erosions. (1) 14mm area of healing ulceration in the antrum. No ulcer or infiltrating process observed. Patent pylorus. Normal-appearing first and second portion of the duodenum .  . kidney stones     cysto with litholapexy   Social History   Social History  . Marital status: Divorced    Spouse name: N/A  . Number of children: 0  . Years of education: 12   Occupational History  . DISABLED   .  Unemployed   Social History Main Topics  . Smoking status: Former Smoker     Packs/day: 1.50    Years: 25.00    Types: Cigarettes    Quit date: 02/20/2002  . Smokeless tobacco: Former Systems developer    Types: Snuff    Quit date: 11/02/1980     Comment: Quit x 15 years  . Alcohol use No  . Drug use: No  . Sexual activity: Not Currently    Birth control/ protection: None   Other Topics Concern  . None   Social History Narrative  . None   Outpatient Encounter Prescriptions as of 03/02/2017  Medication Sig  . ANDROGEL PUMP 20.25 MG/ACT (1.62%) GEL Apply 1 application topically daily.   Marland Kitchen aspirin EC 81 MG tablet Take 81 mg by mouth daily.  . brimonidine-timolol (COMBIGAN) 0.2-0.5 % ophthalmic solution Place 1 drop into both eyes at bedtime. One drop in each eye two times a day    . citalopram (CELEXA) 40 MG tablet TAKE 1 TABLET BY MOUTH ONCE A DAY.  . cyclobenzaprine (FLEXERIL) 10 MG tablet Take 1 tablet (10 mg total) by mouth 3 (three) times daily. (Patient taking differently: Take 10 mg by mouth 3 (three) times daily as needed for muscle spasms. )  . gabapentin (NEURONTIN) 600 MG tablet TAKE ONE TABLET BY MOUTH 3 TIMES DAILY.  Marland Kitchen HUMULIN R U-500 KWIKPEN 500 UNIT/ML kwikpen INJECT UP TO 100 UNITS SUBCUTANEOUSLY 30 MINUTES BEFORE MEALS.  Marland Kitchen ketoconazole (NIZORAL) 2 % shampoo APPLY TOPICALLY TWICE WEEKLY AS DIRECTED.  Marland Kitchen lamoTRIgine (  LAMICTAL) 200 MG tablet TAKE 1 TABLET BY MOUTH ONCE A DAY.  Marland Kitchen levothyroxine (SYNTHROID, LEVOTHROID) 88 MCG tablet Take 1 tablet (88 mcg total) by mouth daily before breakfast.  . lisinopril (PRINIVIL,ZESTRIL) 5 MG tablet TAKE 1 TABLET BY MOUTH ONCE A DAY.  . metoprolol succinate (TOPROL-XL) 25 MG 24 hr tablet TAKE ONE TABLET BY MOUTH ONCE DAILY.  . naproxen (NAPROSYN) 500 MG tablet TAKE ONE TABLET BY MOUTH TWICE DAILY WITH A MEAL.  Marland Kitchen NOVOFINE 30G X 8 MM MISC   . pantoprazole (PROTONIX) 40 MG tablet TAKE (1) TABLET BY MOUTH TWICE DAILY.  . simvastatin (ZOCOR) 40 MG tablet TAKE 1 TABLET BY MOUTH ONCE A DAY.  . SURE COMFORT PEN NEEDLES 31G X 8 MM  MISC USE TWICE DAILY WITH BYETTA  . traMADol (ULTRAM) 50 MG tablet TAKE 2 TABLETS BY MOUTH EVERY 8 HOURS AS NEEDED.  Marland Kitchen TRAVATAN Z 0.004 % SOLN ophthalmic solution Place 1 drop into both eyes at bedtime.   . triamcinolone ointment (KENALOG) 0.1 % APPLY TO AFFECTED AREAS TWICE DAILY.  Marland Kitchen VICTOZA 18 MG/3ML SOPN INJECT 1.8 MG SUBCUTANEOUSLY ONCE DAILY.  Marland Kitchen Vitamin D, Ergocalciferol, (DRISDOL) 50000 units CAPS capsule TAKE ONE CAPSULE BY MOUTH ONCE WEEKLY.  . [DISCONTINUED] levothyroxine (SYNTHROID, LEVOTHROID) 75 MCG tablet Take 1 tablet (75 mcg total) by mouth daily before breakfast.   No facility-administered encounter medications on file as of 03/02/2017.    ALLERGIES: Allergies  Allergen Reactions  . Dilaudid [Hydromorphone Hcl] Shortness Of Breath   VACCINATION STATUS: Immunization History  Administered Date(s) Administered  . Influenza Whole 04/01/2010  . Influenza,inj,Quad PF,6+ Mos 03/08/2013  . Pneumococcal Conjugate-13 07/09/2014  . Pneumococcal Polysaccharide-23 04/01/2010, 10/07/2015  . Tdap 11/24/2011  . Zoster 12/12/2014    Diabetes  He presents for his follow-up diabetic visit. He has type 2 diabetes mellitus. His disease course has been stable. There are no hypoglycemic associated symptoms. Pertinent negatives for hypoglycemia include no confusion, headaches, pallor or seizures. There are no diabetic associated symptoms. Pertinent negatives for diabetes include no chest pain, no fatigue, no polydipsia, no polyphagia, no polyuria and no weakness. There are no hypoglycemic complications. Symptoms are stable. There are no diabetic complications. He is compliant with treatment most of the time. His weight is increasing steadily. He is following a generally unhealthy diet. He rarely participates in exercise. His home blood glucose trend is decreasing steadily. His breakfast blood glucose range is generally 140-180 mg/dl. His lunch blood glucose range is generally 180-200 mg/dl. His  dinner blood glucose range is generally 180-200 mg/dl. His overall blood glucose range is 180-200 mg/dl. An ACE inhibitor/angiotensin II receptor blocker is being taken.  Hyperlipidemia  This is a chronic problem. The current episode started more than 1 year ago. Exacerbating diseases include diabetes and obesity. Pertinent negatives include no chest pain, myalgias or shortness of breath. Current antihyperlipidemic treatment includes statins. Risk factors for coronary artery disease include a sedentary lifestyle, male sex, diabetes mellitus, dyslipidemia and obesity.     Review of Systems  Constitutional: Negative for fatigue and unexpected weight change.  HENT: Negative for dental problem, mouth sores and trouble swallowing.   Eyes: Negative for visual disturbance.  Respiratory: Negative for cough, choking, chest tightness, shortness of breath and wheezing.   Cardiovascular: Negative for chest pain, palpitations and leg swelling.  Gastrointestinal: Negative for abdominal distention, abdominal pain, constipation, diarrhea, nausea and vomiting.  Endocrine: Negative for polydipsia, polyphagia and polyuria.  Genitourinary: Negative for dysuria, flank pain, hematuria  and urgency.  Musculoskeletal: Negative for back pain, gait problem, myalgias and neck pain.  Skin: Negative for pallor, rash and wound.  Neurological: Negative for seizures, syncope, weakness, numbness and headaches.  Psychiatric/Behavioral: Negative.  Negative for confusion and dysphoric mood.    Objective:    BP (!) 141/70   Pulse 85   Ht 5\' 11"  (1.803 m)   Wt (!) 438 lb (198.7 kg)   BMI 61.09 kg/m   Wt Readings from Last 3 Encounters:  03/02/17 (!) 438 lb (198.7 kg)  11/19/16 (!) 431 lb (195.5 kg)  11/10/16 (!) 422 lb (191.4 kg)    Physical Exam  Constitutional: He is oriented to person, place, and time. He appears well-developed and well-nourished. He is cooperative. No distress.  HENT:  Head: Normocephalic and  atraumatic.  Eyes: EOM are normal.  Neck: Normal range of motion. Neck supple. No tracheal deviation present. No thyromegaly present.  Cardiovascular: Normal rate, S1 normal, S2 normal and normal heart sounds.  Exam reveals no gallop.   No murmur heard. Pulses:      Dorsalis pedis pulses are 1+ on the right side, and 1+ on the left side.       Posterior tibial pulses are 1+ on the right side, and 1+ on the left side.  Pulmonary/Chest: Breath sounds normal. No respiratory distress. He has no wheezes.  Abdominal: Soft. Bowel sounds are normal. He exhibits no distension. There is no tenderness. There is no guarding and no CVA tenderness.  Musculoskeletal: He exhibits no edema.       Right shoulder: He exhibits no swelling and no deformity.  Large lower extremities with skin dryness and lateral eczema on the left lower leg.  Neurological: He is alert and oriented to person, place, and time. He has normal strength and normal reflexes. No cranial nerve deficit or sensory deficit. Gait normal.  Skin: Skin is warm and dry. No rash noted. No cyanosis. Nails show no clubbing.  He has extensive tattoos.  Psychiatric: He has a normal mood and affect. His speech is normal and behavior is normal. Judgment and thought content normal. Cognition and memory are normal.    Results for orders placed or performed in visit on 02/22/17  Comprehensive metabolic panel  Result Value Ref Range   Sodium 138 135 - 146 mmol/L   Potassium 5.2 3.5 - 5.3 mmol/L   Chloride 104 98 - 110 mmol/L   CO2 28 20 - 32 mmol/L   Glucose, Bld 178 (H) 65 - 99 mg/dL   BUN 22 7 - 25 mg/dL   Creat 1.25 0.70 - 1.25 mg/dL   Total Bilirubin 0.5 0.2 - 1.2 mg/dL   Alkaline Phosphatase 78 40 - 115 U/L   AST 17 10 - 35 U/L   ALT 17 9 - 46 U/L   Total Protein 6.2 6.1 - 8.1 g/dL   Albumin 3.9 3.6 - 5.1 g/dL   Calcium 9.0 8.6 - 10.3 mg/dL  Hemoglobin A1c  Result Value Ref Range   Hgb A1c MFr Bld 8.0 (H) <5.7 %   Mean Plasma Glucose 183  mg/dL  Diabetic Labs (most recent): Lab Results  Component Value Date   HGBA1C 8.0 (H) 02/22/2017   HGBA1C 7.0 (H) 11/11/2016   HGBA1C 6.3 (H) 08/11/2016   Lipid Panel     Component Value Date/Time   CHOL 159 08/11/2016 0748   TRIG 175 (H) 08/11/2016 0748   HDL 52 08/11/2016 0748   CHOLHDL 3.1 08/11/2016 0748  VLDL 35 (H) 08/11/2016 0748   LDLCALC 72 08/11/2016 0748    Assessment & Plan:   1. 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  Patient came with Improved glycemic profile on U500 insulin taking 80 units 3 times a day before meals. His recent A1c is  Higher at 8%, increasing from  7%. -He did  Not have hypoglycemia .   - Patient remains at a high risk for more acute and chronic complications of diabetes which include CAD, CVA, CKD, retinopathy, and neuropathy. These are all discussed in detail with the patient.  - I have re-counseled the patient on diet management and weight loss  by adopting a carbohydrate restricted / protein rich  Diet. - Patient is advised to stick to a routine mealtimes to eat 3 meals  a day and avoid unnecessary snacks ( to snack only to correct hypoglycemia).  - Suggestion is made for him to avoid simple carbohydrates  from his diet including Cakes, Sweet Desserts, Ice Cream, Soda (diet and regular), Sweet Tea, Candies, Chips, Cookies, Store Bought Juices, Alcohol in Excess of  1-2 drinks a day, Artificial Sweeteners, and "Sugar-free" Products. This will help patient to have stable blood glucose profile and potentially avoid unintended weight gain.   - The patient  has been  scheduled with Jearld Fenton, RDN, CDE for individualized DM education. - I have approached patient with the following individualized plan to manage diabetes and patient agrees.  - I will increase his Humulin U500   to 60 units 3 times a day before meals for pre-meal blood glucose above 90 mg/dL  associated with strict monitoring of glucose 4 times a day-before meals and at bedtime. - He would benefit from continuous glucose monitoring, I would consider the Freestyle Libre device during his next visit. - Patient is warned not to take insulin without proper monitoring per orders.  -Patient is encouraged to call clinic for blood glucose levels less than 70 or above 300 mg /dl. - I will continue Victoza 1.8 mg subcutaneous daily, therapeutically suitable for patient.. -Patient does not tolerate metformin. - Patient specific target  for A1c; LDL, HDL, Triglycerides, and  Waist Circumference were discussed in detail.  2) BP/HTN: Controlled. Continue current medications including ACEI. 3) Lipids/HPL:  continue statins. 4)  Weight/Diet: CDE consult has been initiated, exercise, and carbohydrates information provided. He recently lost 37 pounds mainly due to gastroenteritis.  5) hypothyroidism:   he will benefit from slight increase in his levothyroxine dose. I will prescribe levothyroxine 88 g by mouth every morning before breakfast.  - We discussed about correct intake of levothyroxine, at fasting, with water, separated by at least 30 minutes from breakfast, and separated by more than 4 hours from calcium, iron, multivitamins, acid reflux medications (PPIs). -Patient is made aware of the fact that thyroid hormone replacement is needed for life, dose to be adjusted by periodic monitoring of thyroid function tests.   6) Chronic Care/Health Maintenance:  -Patient is on ACEI and Statin medications and encouraged to continue to follow up with Ophthalmology, Podiatrist at least yearly or according to recommendations, and advised to  stay away from smoking. I have recommended yearly flu vaccine and pneumonia vaccination at least every 5 years; moderate intensity exercise for up to 150 minutes weekly; and  sleep for at least 7 hours a day. I refilled his Kenalog ointment given to him for bilateral  lower extremity dryness and eczema  .  - Time spent with the patient: 25 min, of which >50% was spent in reviewing his sugar logs , discussing his hypo- and hyper-glycemic episodes, reviewing his current and  previous labs and insulin doses and developing a plan to avoid hypo- and hyper-glycemia.   I advised patient to maintain close follow up with his PCP for primary care needs.  Patient is asked to bring meter and  blood glucose logs during his next visit.   Follow up plan: Return in about 3 months (around 06/01/2017) for follow up with pre-visit labs, meter, and logs.  Glade Lloyd, MD Phone: [phone]  Fax: [phone]  This note was partially dictated with voice recognition software. Similar sounding words can be transcribed inadequately or may not  be corrected upon review.  03/02/2017, 11:46 AM

## 2017-03-09 ENCOUNTER — Ambulatory Visit: Payer: PPO | Admitting: Sports Medicine

## 2017-03-10 ENCOUNTER — Encounter: Payer: Self-pay | Admitting: Internal Medicine

## 2017-03-18 ENCOUNTER — Other Ambulatory Visit: Payer: Self-pay | Admitting: Family Medicine

## 2017-03-18 ENCOUNTER — Other Ambulatory Visit: Payer: Self-pay | Admitting: "Endocrinology

## 2017-03-18 NOTE — Telephone Encounter (Signed)
Seen 5 15 18

## 2017-04-04 ENCOUNTER — Other Ambulatory Visit: Payer: Self-pay | Admitting: Gastroenterology

## 2017-04-04 ENCOUNTER — Other Ambulatory Visit: Payer: Self-pay | Admitting: "Endocrinology

## 2017-04-05 ENCOUNTER — Other Ambulatory Visit: Payer: Self-pay | Admitting: "Endocrinology

## 2017-04-05 ENCOUNTER — Other Ambulatory Visit: Payer: Self-pay

## 2017-04-05 MED ORDER — TRAMADOL HCL 50 MG PO TABS: ORAL_TABLET | ORAL | 0 refills | Status: DC

## 2017-04-08 ENCOUNTER — Encounter: Payer: Self-pay | Admitting: Family Medicine

## 2017-04-08 ENCOUNTER — Ambulatory Visit (INDEPENDENT_AMBULATORY_CARE_PROVIDER_SITE_OTHER): Payer: PPO | Admitting: Family Medicine

## 2017-04-08 VITALS — BP 140/70 | HR 84 | Temp 97.8°F | Resp 16 | Ht 72.0 in | Wt >= 6400 oz

## 2017-04-08 DIAGNOSIS — E782 Mixed hyperlipidemia: Secondary | ICD-10-CM | POA: Diagnosis not present

## 2017-04-08 DIAGNOSIS — E1169 Type 2 diabetes mellitus with other specified complication: Secondary | ICD-10-CM | POA: Diagnosis not present

## 2017-04-08 DIAGNOSIS — M542 Cervicalgia: Secondary | ICD-10-CM | POA: Diagnosis not present

## 2017-04-08 DIAGNOSIS — E669 Obesity, unspecified: Secondary | ICD-10-CM

## 2017-04-08 DIAGNOSIS — K029 Dental caries, unspecified: Secondary | ICD-10-CM

## 2017-04-08 DIAGNOSIS — I10 Essential (primary) hypertension: Secondary | ICD-10-CM | POA: Diagnosis not present

## 2017-04-08 MED ORDER — PREDNISONE 5 MG (21) PO TBPK: 5.0000 mg | ORAL_TABLET | ORAL | 0 refills | Status: DC

## 2017-04-08 MED ORDER — METHYLPREDNISOLONE ACETATE 80 MG/ML IJ SUSP
80.0000 mg | Freq: Once | INTRAMUSCULAR | Status: AC
  Administered 2017-04-08: 80 mg via INTRAMUSCULAR

## 2017-04-08 MED ORDER — HYDROCHLOROTHIAZIDE 25 MG PO TABS: 25.0000 mg | ORAL_TABLET | Freq: Every day | ORAL | 3 refills | Status: DC

## 2017-04-08 MED ORDER — CEPHALEXIN 500 MG PO CAPS: 500.0000 mg | ORAL_CAPSULE | Freq: Three times a day (TID) | ORAL | 0 refills | Status: DC

## 2017-04-08 NOTE — Patient Instructions (Addendum)
Change Oct 30 appt to early December  Due to change in BP medication   Fasting lipid, hepatic, cbc for Oct 30  appt  Depo medrol 80 mg IM today for neck and LUE pain    New additional; medication for blood pressure is HCTZ   For pain and numbness in LUE is prednisone for 6 days  Keflex is prescribed for dental problem , and I recommend calling health dept for info re dental clinic  Thank you  for choosing [facility]. We consider it a privelige to serve you.  Delivering excellent health care in a caring and  compassionate way is our goal.  Partnering with you,  so that together we can achieve this goal is our strategy.

## 2017-04-10 DIAGNOSIS — K029 Dental caries, unspecified: Secondary | ICD-10-CM | POA: Insufficient documentation

## 2017-04-10 DIAGNOSIS — M542 Cervicalgia: Secondary | ICD-10-CM | POA: Insufficient documentation

## 2017-04-10 NOTE — Assessment & Plan Note (Signed)
Managed by endo, not as good as in the past , he is working on this Nicholas [name] is reminded of the importance of commitment to daily physical activity for 30 minutes or more, as able and the need to limit carbohydrate intake to 30 to 60 grams per meal to help with blood sugar control.   The need to take medication as prescribed, test blood sugar as directed, and to call between visits if there is a concern that blood sugar is uncontrolled is also discussed.   Nicholas [name] is reminded of the importance of daily foot exam, annual eye examination, and good blood sugar, blood pressure and cholesterol control.  Diabetic Labs Latest Ref Rng & Units 02/22/2017 11/11/2016 08/11/2016 05/06/2016 05/04/2016  HbA1c <5.7 % 8.0(H) 7.0(H) 6.3(H) 5.8(H) -  Microalbumin <2.0 mg/dL - - - - -  Micro/Creat Ratio 0.0 - 30.0 mg/g - - - - -  Chol <200 mg/dL - - 159 - -  HDL >40 mg/dL - - 52 - -  Calc LDL <100 mg/dL - - 72 - -  Triglycerides <150 mg/dL - - 175(H) - -  Creatinine 0.70 - 1.25 mg/dL 1.25 1.18 1.17 1.26(H) 1.12   BP/Weight 04/08/2017 03/02/2017 11/19/2016 11/10/2016 11/02/2016 10/20/2016 3/73/4287  Systolic BP 681 157 262 035 597 416 384  Diastolic BP 70 70 79 80 80 80 49  Wt. (Lbs) 449.75 438 431 422 424.04 - 413  BMI 61 61.09 60.11 58.86 59.14 - 57.6   Foot/eye exam completion dates Latest Ref Rng & Units 05/29/2016 02/14/2016  Eye Exam No Retinopathy No Retinopathy -  Foot exam Order - - -  Foot Form Completion - - Done

## 2017-04-10 NOTE — Progress Notes (Signed)
Nicholas [name]     MRN: [id number]      DOB: 27-Feb-1956   HPI Nicholas [name] is here for follow up and re-evaluation of chronic medical conditions, medication management and review of any available recent lab and radiology data.  Preventive health is updated, specifically  Cancer screening and Immunization.  Considering the flu vaccine, not commited yet Questions or concerns regarding consultations or procedures which the PT has had in the interim are  addressed. The PT denies any adverse reactions to current medications since the last visit.  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  ROS Denies recent fever , chil;ls, sinus pressure or sore throat or cough   Denies dysuria, frequency, hesitancy or incontinence.  Denies headaches, seizures, numbness, or tingling. Denies uncontroll0ed  depression, anxiety or insomnia. Denies skin break down or rash.   PE  BP 140/70   Pulse 84   Temp 97.8 F (36.6 C) (Other (Comment))   Resp 16   Ht 6' (1.829 m)   Wt (!) 449 lb 12 oz (204 kg)   SpO2 99%   BMI 61.00 kg/m   Patient alert and oriented and in no cardiopulmonary distress.  HEENT: No facial asymmetry, EOMI,   oropharynx pink and moist.  Neck decreased ROM no JVD, no mass.Very poor dentition, multiple caries, and plaque, missing teeth and fillings out  Chest: Clear to auscultation bilaterally.  CVS: S1, S2 no murmurs, no S3.Regular rate.  ABD: Soft non tender.   Ext: No edema  MS: decreased  ROM spine, shoulders, hips and knees.  Skin: Intact, no ulcerations or rash noted.  Psych: Good eye contact, normal affect. Memory intact not anxious or depressed appearing.  CNS: CN 2-12 intact, power,  normal throughout.no focal  deficits noted.   Assessment & Plan  Neck pain on left side Acute flare, Depo medrol 80 mg iM followed by prednisone dose pack  Dental caries Advised about dental services through the health dept and prescription sent in for kef;lex in the interim  Diabetes mellitus type 2 in obese ([place]) Managed by endo, not as good as in the past , he is working on this Nicholas [name] is reminded of the importance of commitment to daily physical activity for 30 minutes or more, as able and the need to limit carbohydrate intake to 30 to 60 grams per meal to help with blood sugar control.   The need to take medication as prescribed, test blood sugar as directed, and to call between visits if there is a concern that blood sugar is uncontrolled is also discussed.   Nicholas [name] is reminded of the importance of daily foot exam, annual eye examination, and good blood sugar, blood pressure and cholesterol control.  Diabetic Labs Latest Ref Rng & Units 02/22/2017 11/11/2016 08/11/2016 05/06/2016 05/04/2016  HbA1c <5.7 % 8.0(H) 7.0(H) 6.3(H) 5.8(H) -  Microalbumin <2.0 mg/dL - - - - -  Micro/Creat Ratio 0.0 - 30.0 mg/g - - - - -  Chol <200 mg/dL - - 159 - -  HDL >40 mg/dL - - 52 - -  Calc LDL <100 mg/dL - - 72 - -  Triglycerides <150 mg/dL - - 175(H) - -  Creatinine  0.70 - 1.25 mg/dL 1.25 1.18 1.17 1.26(H) 1.12   BP/Weight 04/08/2017 03/02/2017 11/19/2016 11/10/2016 11/02/2016 10/20/2016 3/57/0177  Systolic BP 939 030 092 330 076 226 333  Diastolic BP 70 70 79 80 80 80 49  Wt. (Lbs) 449.75 438 431 422 424.04 - 413  BMI 61 61.09 60.11 58.86 59.14 - 57.6   Foot/eye exam completion dates Latest Ref Rng & Units 05/29/2016 02/14/2016  Eye Exam No Retinopathy No Retinopathy -  Foot exam Order - - -  Foot Form Completion - - Done         Benign hypertension DASH diet and commitment to daily physical activity for a minimum of 30 minutes discussed and encouraged, as a part of hypertension management. The importance  of attaining a healthy weight is also discussed. Will need to lose weight to achieve better conrol  BP/Weight 04/08/2017 03/02/2017 11/19/2016 11/10/2016 11/02/2016 10/20/2016 5/45/6256  Systolic BP 389 373 428 768 115 726 203  Diastolic BP 70 70 79 80 80 80 49  Wt. (Lbs) 449.75 438 431 422 424.04 - 413  BMI 61 61.09 60.11 58.86 59.14 - 57.6       MORBID OBESITY Deteriorated. Patient re-educated about  the importance of commitment to a  minimum of 150 minutes of exercise per week.  The importance of healthy food choices with portion control discussed. Encouraged to start a food diary, count calories and to consider  joining a support group. Sample diet sheets offered. Goals set by the patient for the next several months.   Weight /BMI 04/08/2017 03/02/2017 11/19/2016  WEIGHT 449 lb 12 oz 438 lb 431 lb  HEIGHT 6\' 0"  5\' 11"  5\' 11"   BMI 61 kg/m2 61.09 kg/m2 60.11 kg/m2

## 2017-04-10 NOTE — Assessment & Plan Note (Addendum)
Acute flare, Depo medrol 80 mg iM followed by prednisone dose pack

## 2017-04-10 NOTE — Assessment & Plan Note (Signed)
Deteriorated. Patient re-educated about  the importance of commitment to a  minimum of 150 minutes of exercise per week.  The importance of healthy food choices with portion control discussed. Encouraged to start a food diary, count calories and to consider  joining a support group. Sample diet sheets offered. Goals set by the patient for the next several months.   Weight /BMI 04/08/2017 03/02/2017 11/19/2016  WEIGHT 449 lb 12 oz 438 lb 431 lb  HEIGHT 6\' 0"  5\' 11"  5\' 11"   BMI 61 kg/m2 61.09 kg/m2 60.11 kg/m2

## 2017-04-10 NOTE — Assessment & Plan Note (Signed)
DASH diet and commitment to daily physical activity for a minimum of 30 minutes discussed and encouraged, as a part of hypertension management. The importance of attaining a healthy weight is also discussed. Will need to lose weight to achieve better conrol  BP/Weight 04/08/2017 03/02/2017 11/19/2016 11/10/2016 11/02/2016 10/20/2016 10/07/3011  Systolic BP 143 888 757 972 820 601 561  Diastolic BP 70 70 79 80 80 80 49  Wt. (Lbs) 449.75 438 431 422 424.04 - 413  BMI 61 61.09 60.11 58.86 59.14 - 57.6

## 2017-04-10 NOTE — Assessment & Plan Note (Signed)
Advised about dental services through the health dept and prescription sent in for kef;lex in the interim

## 2017-04-12 ENCOUNTER — Other Ambulatory Visit: Payer: Self-pay | Admitting: "Endocrinology

## 2017-04-13 ENCOUNTER — Encounter: Payer: Self-pay | Admitting: Sports Medicine

## 2017-04-13 ENCOUNTER — Ambulatory Visit (INDEPENDENT_AMBULATORY_CARE_PROVIDER_SITE_OTHER): Payer: PPO | Admitting: Sports Medicine

## 2017-04-13 DIAGNOSIS — L97511 Non-pressure chronic ulcer of other part of right foot limited to breakdown of skin: Secondary | ICD-10-CM

## 2017-04-13 DIAGNOSIS — M79672 Pain in left foot: Secondary | ICD-10-CM | POA: Diagnosis not present

## 2017-04-13 DIAGNOSIS — M779 Enthesopathy, unspecified: Secondary | ICD-10-CM

## 2017-04-13 DIAGNOSIS — E1140 Type 2 diabetes mellitus with diabetic neuropathy, unspecified: Secondary | ICD-10-CM | POA: Diagnosis not present

## 2017-04-13 DIAGNOSIS — B351 Tinea unguium: Secondary | ICD-10-CM | POA: Diagnosis not present

## 2017-04-13 DIAGNOSIS — E1149 Type 2 diabetes mellitus with other diabetic neurological complication: Secondary | ICD-10-CM | POA: Diagnosis not present

## 2017-04-13 DIAGNOSIS — E11610 Type 2 diabetes mellitus with diabetic neuropathic arthropathy: Secondary | ICD-10-CM

## 2017-04-13 MED ORDER — TRIAMCINOLONE ACETONIDE 40 MG/ML IJ SUSP: 20.0000 mg | Freq: Once | INTRAMUSCULAR | Status: AC

## 2017-04-13 NOTE — Progress Notes (Signed)
   Subjective:    Patient ID: Nicholas [name], male    DOB: 08/02/1955, 61 y.o.   MRN: [id number]  HPI    Review of Systems  All other systems reviewed and are negative.      Objective:   Physical Exam        Assessment & Plan:

## 2017-04-13 NOTE — Progress Notes (Signed)
Patient ID: Nicholas [name], male   DOB: 03-13-1956, 61 y.o.   MRN: [id number]  Subjective: Nicholas [name] is a 61 y.o. Diabetic male patient seen in office for callus at right 1st toe, states that he has had some bleeding. Patient also desires nails to be trimmed and desires injection forpain at left foot. States that he has not been able to wear a shoe with the brace. Denies any other symptoms.    Patient Active Problem List   Diagnosis Date Noted  . Neck pain on left side 04/10/2017  . Dental caries 04/10/2017  . Hypothyroidism 08/19/2016  . GERD (gastroesophageal reflux disease) 04/14/2016  . Charcot foot due to diabetes mellitus ([place]) 01/28/2016  . Benign essential tremor 01/28/2016  . Skin lesion of back 10/07/2015  . Back pain with left-sided radiculopathy 06/05/2015  . Hyperlipidemia 04/10/2015  . Abdominal wall bulge 01/15/2015  . Hx of adenomatous colonic polyps 02/21/2014  . Allergic rhinitis 07/24/2013  . Onychomycosis 05/21/2013  . Type 2 diabetes mellitus with pressure callus ([place]) 05/21/2013  . Back pain with radiation 04/20/2011  . Seborrheic dermatitis 11/20/2010  . Dermatomycosis 11/20/2010  . FATIGUE 04/01/2010  . CARPAL TUNNEL SYNDROME, BILATERAL 03/24/2010  . Hypogonadism in male 01/27/2010  . DEPRESSION/ANXIETY 01/27/2010  . CHF 01/13/2010  . DYSPNEA 01/13/2010  . Diabetes mellitus type 2 in obese ([place]) 12/12/2009  . MORBID OBESITY 12/12/2009  . Benign hypertension 12/12/2009  . PVD (peripheral vascular disease) ([place]) 12/12/2009   Current Outpatient Prescriptions on File Prior to Visit  Medication Sig Dispense Refill  . ANDROGEL PUMP 20.25 MG/ACT (1.62%) GEL Apply 1 application topically daily.     Marland Kitchen aspirin EC 81 MG tablet Take 81 mg by mouth daily.    . brimonidine-timolol (COMBIGAN) 0.2-0.5 % ophthalmic solution Place 1 drop into both eyes at bedtime. One drop in each eye two times a day      . cephALEXin (KEFLEX) 500 MG capsule Take 1 capsule  (500 mg total) by mouth 3 (three) times daily. 30 capsule 0  . citalopram (CELEXA) 40 MG tablet TAKE 1 TABLET BY MOUTH ONCE A DAY. 90 tablet 0  . cyclobenzaprine (FLEXERIL) 10 MG tablet Take 1 tablet (10 mg total) by mouth 3 (three) times daily. (Patient taking differently: Take 10 mg by mouth 3 (three) times daily as needed for muscle spasms. ) 20 tablet 0  . gabapentin (NEURONTIN) 600 MG tablet TAKE ONE TABLET BY MOUTH 3 TIMES DAILY. 270 tablet 1  . HUMULIN R U-500 KWIKPEN 500 UNIT/ML kwikpen INJECT UP TO 100 UNITS SUBCUTANEOUSLY 30 MINUTES BEFORE MEALS. 18 mL 0  . hydrochlorothiazide (HYDRODIURIL) 25 MG tablet Take 1 tablet (25 mg total) by mouth daily. 30 tablet 3  . ketoconazole (NIZORAL) 2 % shampoo APPLY TOPICALLY TWICE WEEKLY AS DIRECTED. 120 mL 0  . lamoTRIgine (LAMICTAL) 200 MG tablet TAKE 1 TABLET BY MOUTH ONCE A DAY. 90 tablet 1  . levothyroxine (SYNTHROID, LEVOTHROID) 88 MCG tablet Take 1 tablet (88 mcg total) by mouth daily before breakfast. 30 tablet 3  . lisinopril (PRINIVIL,ZESTRIL) 5 MG tablet TAKE 1 TABLET BY MOUTH ONCE A DAY. 90 tablet 1  . metoprolol succinate (TOPROL-XL) 25 MG 24 hr tablet TAKE (1) TABLET BY MOUTH ONCE DAILY. 90 tablet 1  . naproxen (NAPROSYN) 500 MG tablet TAKE ONE TABLET BY MOUTH TWICE DAILY WITH A MEAL. 60 tablet 5  . NOVOFINE 30G X 8 MM MISC     . pantoprazole (PROTONIX) 40 MG  tablet TAKE (1) TABLET BY MOUTH TWICE DAILY. 180 tablet 3  . predniSONE (STERAPRED UNI-PAK 21 TAB) 5 MG (21) TBPK tablet Take 1 tablet (5 mg total) by mouth as directed. Use as directed 21 tablet 0  . simvastatin (ZOCOR) 40 MG tablet TAKE 1 TABLET BY MOUTH ONCE A DAY. 90 tablet 1  . SURE COMFORT PEN NEEDLES 31G X 8 MM MISC USE TWICE DAILY WITH BYETTA 100 each 2  . traMADol (ULTRAM) 50 MG tablet TAKE 2 TABLETS BY MOUTH EVERY 8 HOURS AS NEEDED. 180 tablet 0  . TRAVATAN Z 0.004 % SOLN ophthalmic solution Place 1 drop into both eyes at bedtime.     . triamcinolone ointment (KENALOG) 0.1  % APPLY TO AFFECTED AREAS TWICE DAILY. 454 g 0  . VICTOZA 18 MG/3ML SOPN INJECT 1.8 MG SUBCUTANEOUSLY ONCE DAILY. 9 mL 2  . Vitamin D, Ergocalciferol, (DRISDOL) 50000 units CAPS capsule TAKE ONE CAPSULE BY MOUTH ONCE WEEKLY. 12 capsule 0   No current facility-administered medications on file prior to visit.    Allergies  Allergen Reactions  . Dilaudid [Hydromorphone Hcl] Shortness Of Breath    Recent Results (from the past 2160 hour(s))  Comprehensive metabolic panel     Status: Abnormal   Collection Time: 02/22/17  9:04 AM  Result Value Ref Range   Sodium 138 135 - 146 mmol/L   Potassium 5.2 3.5 - 5.3 mmol/L   Chloride 104 98 - 110 mmol/L   CO2 28 20 - 32 mmol/L    Comment: ** Please note change in reference range(s). **      Glucose, Bld 178 (H) 65 - 99 mg/dL   BUN 22 7 - 25 mg/dL   Creat 1.25 0.70 - 1.25 mg/dL    Comment:   For patients > or = 62 years of age: The upper reference limit for Creatinine is approximately 13% higher for people identified as African-American.      Total Bilirubin 0.5 0.2 - 1.2 mg/dL   Alkaline Phosphatase 78 40 - 115 U/L   AST 17 10 - 35 U/L   ALT 17 9 - 46 U/L   Total Protein 6.2 6.1 - 8.1 g/dL   Albumin 3.9 3.6 - 5.1 g/dL   Calcium 9.0 8.6 - 10.3 mg/dL  Hemoglobin A1c     Status: Abnormal   Collection Time: 02/22/17  9:04 AM  Result Value Ref Range   Hgb A1c MFr Bld 8.0 (H) <5.7 %    Comment:   For someone without known diabetes, a hemoglobin A1c value of 6.5% or greater indicates that they may have diabetes and this should be confirmed with a follow-up test.   For someone with known diabetes, a value <7% indicates that their diabetes is well controlled and a value greater than or equal to 7% indicates suboptimal control. A1c targets should be individualized based on duration of diabetes, age, comorbid conditions, and other considerations.   Currently, no consensus exists for use of hemoglobin A1c for diagnosis of diabetes for  children.      Mean Plasma Glucose 183 mg/dL    Objective: There were no vitals filed for this visit.  General: Patient is awake, alert, oriented x 3 and in no acute distress.  Dermatology: Skin is warm and dry bilateral with a re-opened ulceration at right hallux with reactive keratosis with granular base that measures 0.5x0.8cm with no signs of acute infection at site. Nails are elongated and thick with no signs of ingrowing or  infection.    Vascular: Dorsalis Pedis pulse = 0/4 Bilateral,  Posterior Tibial pulse = 1/4 Bilateral faint,  Capillary Fill Time < 5 seconds, + chronic venous skin changes bilateral.  Neurologic:Protective sensation diminished using the 5.07/10g Semmes Weinstein Monofilament. Vibratory diminished bilateral.   Musculosketal:  Subjective pain to plantar medial and dorsal midfoot on left with accompanying prominent bones supportive of Charcot. No Pain with palpation to ulcerated area at right hallux. + Hallux interphalangeus R>L hallux, No pain with compression to calves bilateral.   Assessment and Plan:  Problem List Items Addressed This Visit      Endocrine   Charcot foot due to diabetes mellitus ([place])   Relevant Medications   triamcinolone acetonide (KENALOG-40) injection 20 mg (Start on 04/13/2017 12:30 PM)    Other Visit Diagnoses    Toe ulcer, right, limited to breakdown of skin ([place])    -  Primary   Type II diabetes mellitus with neurological manifestations ([place])       Diabetic neuropathy with neurologic complication ([facility])       Dermatophytosis of nail       Left foot pain       Relevant Medications   triamcinolone acetonide (KENALOG-40) injection 20 mg (Start on 04/13/2017 12:30 PM)   Tendonitis       Relevant Medications   triamcinolone acetonide (KENALOG-40) injection 20 mg (Start on 04/13/2017 12:30 PM)     -Examined patient  -Mechanically debrided nails x 10 using sterile nail nipper and debrided using sterile chisel blade to level of  dermis right hallux ulceration and dressed with antibiotic cream and bandaid. Advised patient to do the same -After oral consent and aseptic prep, injected a mixture containing 1 ml of 2%  plain lidocaine, 1 ml 0.5% plain marcaine, and 0.5 ml of dexamethasone phosphate into left PT tendon area to see if this will help with pain without complication. Post-injection care discussed with patient.  -Continue with gabapentin  -[employer] diabetic shoe order form was completed; office to contact primary care for approval / certification;  Office to arrange shoe fitting and dispensing. Will make sure shoe can accomodate brace -Patient to return to office in 4 weeks for right 1st toe ulcer care or sooner if problems arise.  Landis Martins, DPM

## 2017-04-14 IMAGING — CT CT ABDOMEN W/ CM
2 of 5 series · 15 of 46 positions shown, 17 images · IV contrast (Omni 300)
Comparison: None.

CLINICAL DATA: Abdominal and back pain over the past 4 months.

EXAM:
CT ABDOMEN WITH CONTRAST
TECHNIQUE: Multidetector CT imaging of the abdomen was performed using the
standard protocol following bolus administration of intravenous
contrast.
CONTRAST:  100mL OMNIPAQUE IOHEXOL 300 MG/ML  SOLN

[Series 3: abd/ pelvis 5.0 i30f 1 · axial · 0.98mm/px · z∈[+1167,+1447]mm · 12 of 66 slices shown, 14 images]
[im 5/66  soft-tissue]
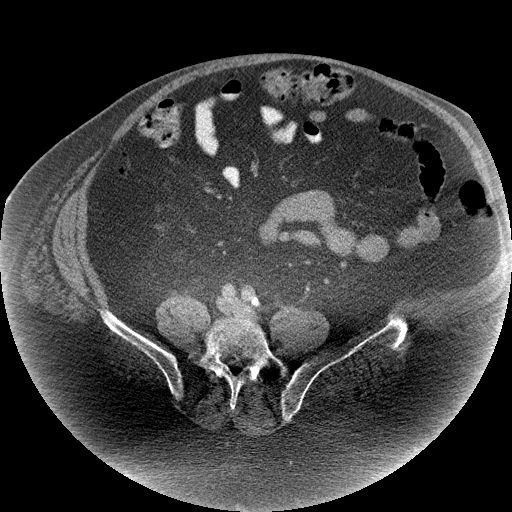
[im 5/66  bone]
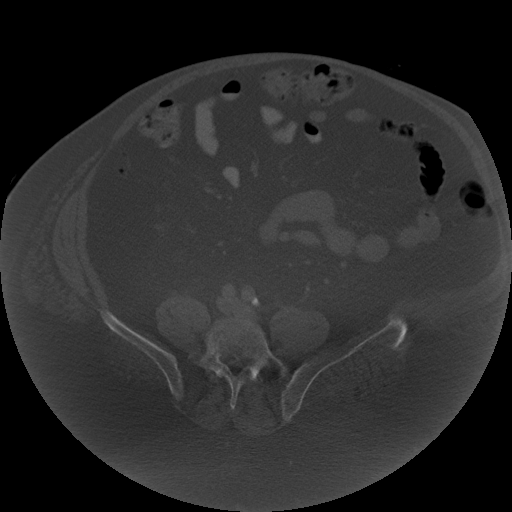
[im 10/66  soft-tissue]
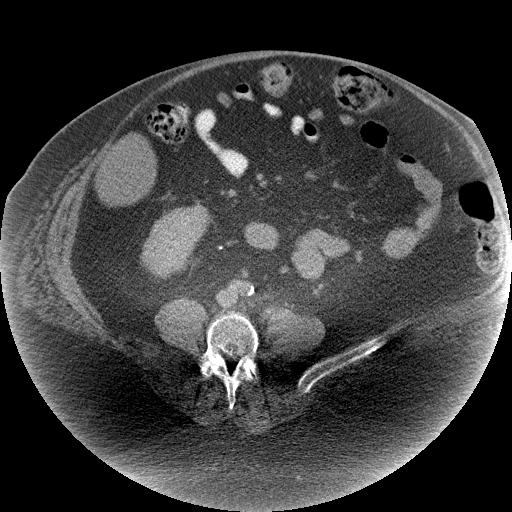
[im 14/66  soft-tissue]
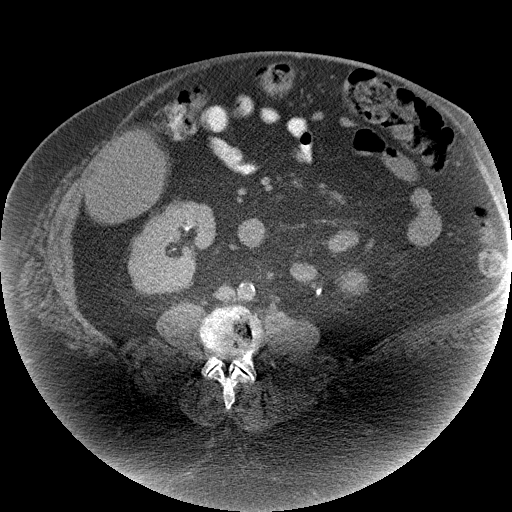
[im 19/66  soft-tissue]
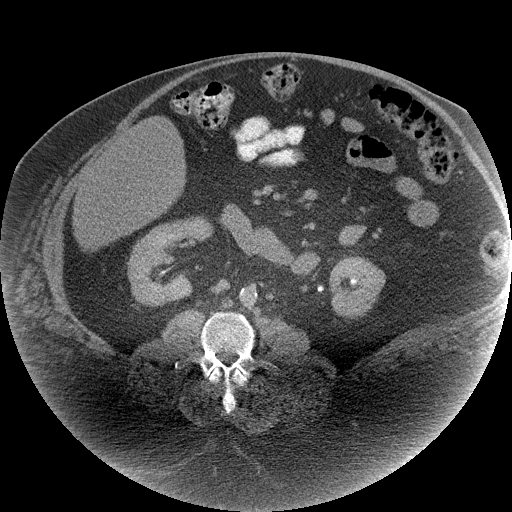
[im 24/66  soft-tissue]
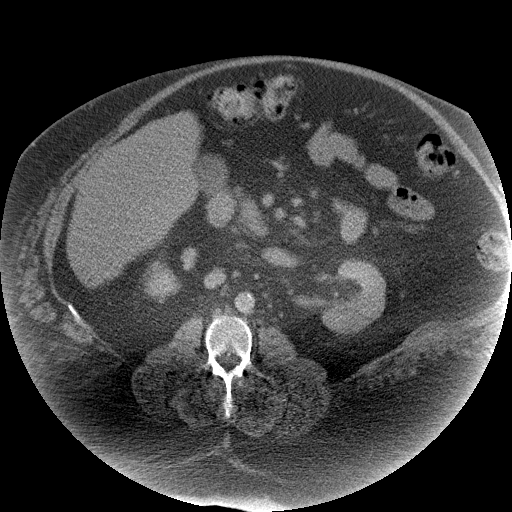
[im 28/66  soft-tissue]
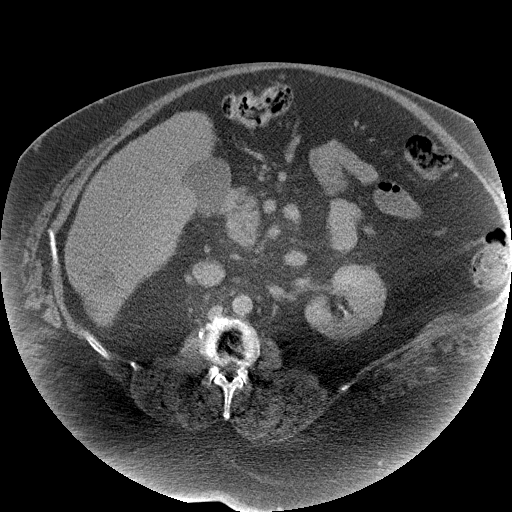
[im 38/66  soft-tissue]
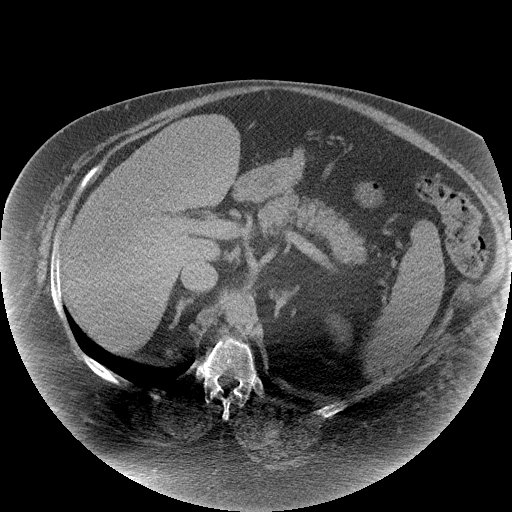
[im 42/66  soft-tissue]
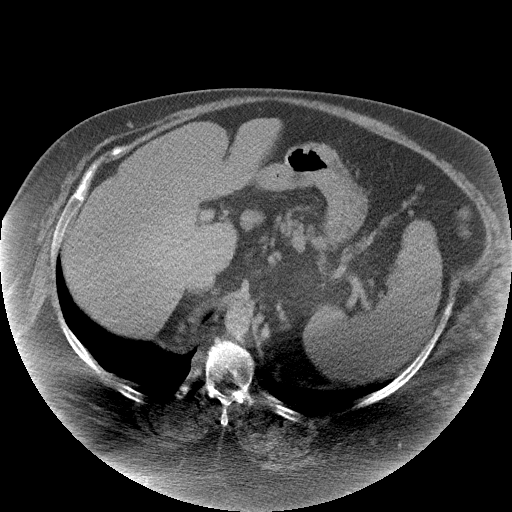
[im 47/66  soft-tissue]
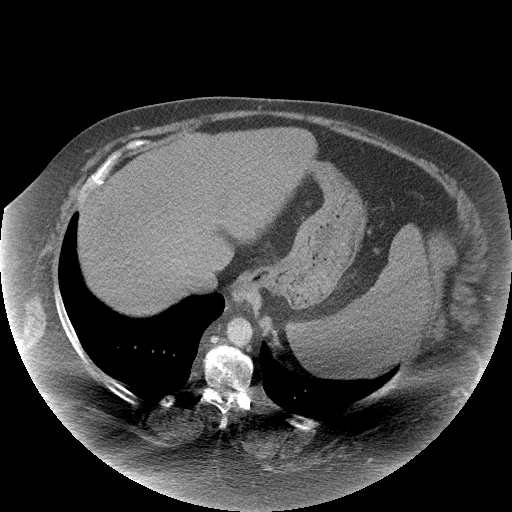
[im 47/66  bone]
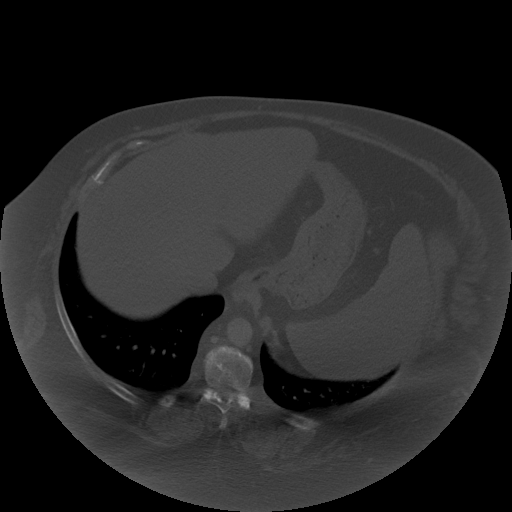
[im 52/66  soft-tissue]
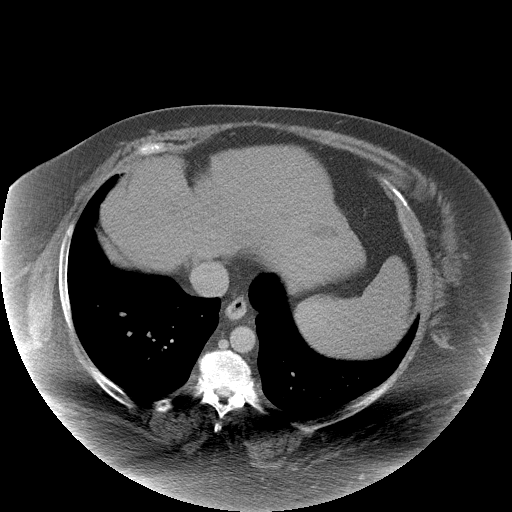
[im 56/66  soft-tissue]
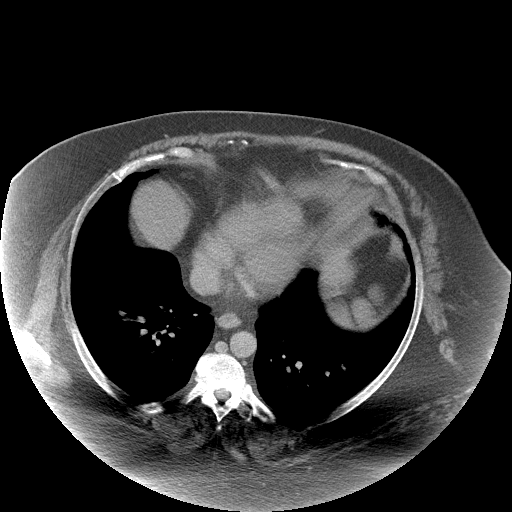
[im 61/66  soft-tissue]
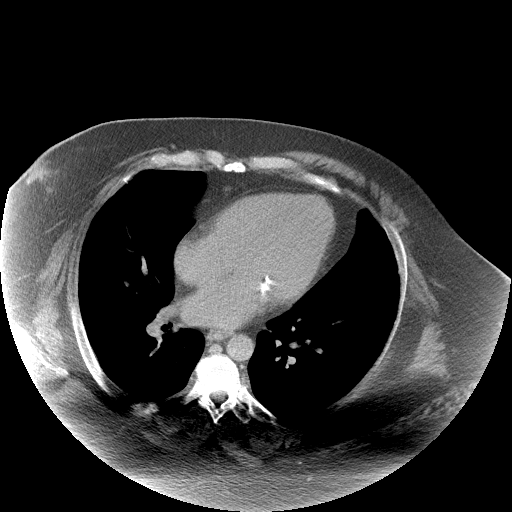

[Series 10: coronals · coronal · 0.71mm/px · 3 of 232 slices shown]
[im 78/232  soft-tissue]
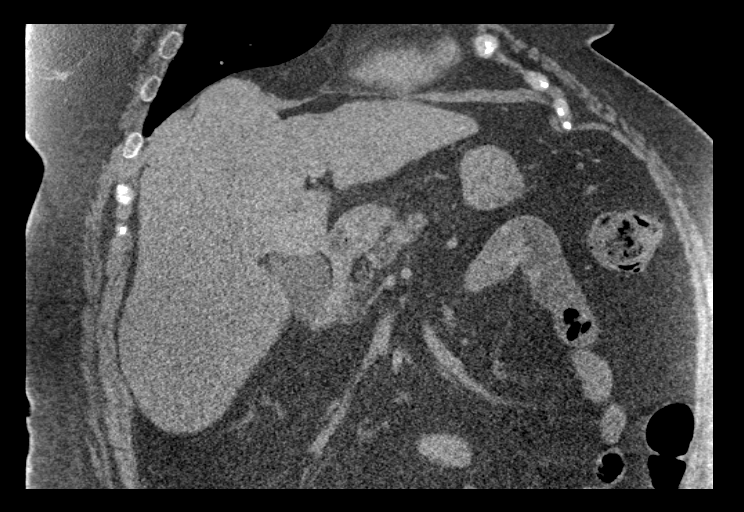
[im 103/232  soft-tissue]
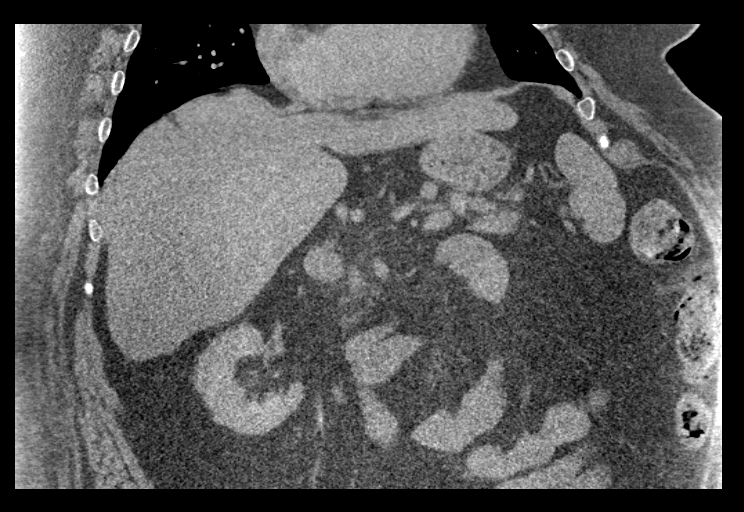
[im 129/232  soft-tissue]
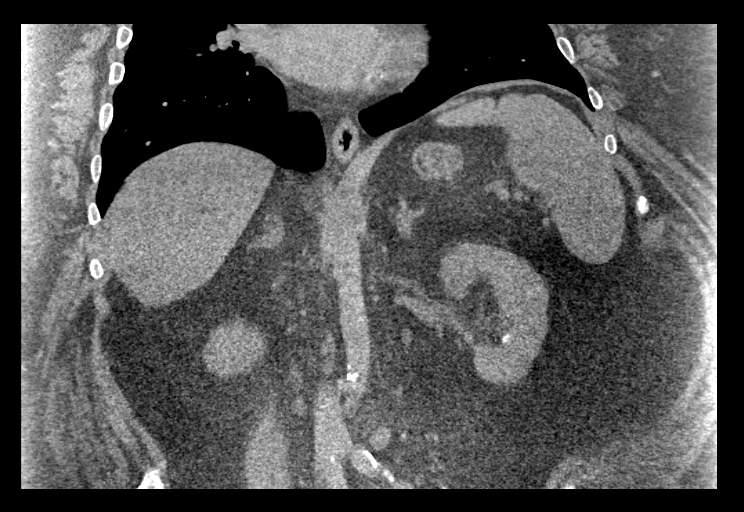

[15 of 46 positions shown; findings below may reference images not displayed]

FINDINGS: Body habitus reduces diagnostic sensitivity and specificity.

Lower chest:  Small right posterior diaphragmatic eventration.

Calcified mitral valve.

Hepatobiliary: 3.8 by 1.4 cm hypodense lesion in segment 2 of the
liver, image 14 series 3. Indistinct 3.0 by 3.8 by 4.3 cm hypodense
lesion in segment 6 of the liver, image 40 series 3.

Dependent gallstones in the gallbladder.

Pancreas: Unremarkable

Spleen: Unremarkable

Adrenals/Urinary Tract: Unremarkable

Stomach/Bowel: Unremarkable

Vascular/Lymphatic: Aortoiliac atherosclerotic vascular disease.
Porta hepatis node 1.7 cm, image 25 series 3. Peripancreatic lymph
node adjacent to the pancreatic head 2.4 cm in short axis, image 35
series 3.

Other: No supplemental non-categorized findings.

Musculoskeletal: Thoracic and lumbar spondylosis and degenerative
disc disease with suspected multilevel impingement including
foraminal impingement at T12-L1, L1-2, L3-4, L4-5, and L5-S1.
IMPRESSION: 1. Mild-to-moderate porta hepatis adenopathy of uncertain etiology
and significance. There are also 2 nonspecific hypodense hepatic
lesions, one in segment 3 and one in segment 6. Body habitus may
preclude MRI is an option for further workup. Options include
short-term followup imaging in 3 months time to assess for change,
versus percutaneous biopsy.
2. Calcified mitral valve.
3. Cholelithiasis.
4.  Aortoiliac atherosclerotic vascular disease.
5. Suspected multilevel impingement in the lower thoracic and lumbar
spine.

## 2017-04-19 NOTE — Care Management (Signed)
Pt  Needs to test BG four times daily. (with each meal and at bedtime)

## 2017-04-21 DIAGNOSIS — E1165 Type 2 diabetes mellitus with hyperglycemia: Secondary | ICD-10-CM | POA: Diagnosis not present

## 2017-04-27 ENCOUNTER — Ambulatory Visit: Payer: PPO | Admitting: Family Medicine

## 2017-05-06 ENCOUNTER — Ambulatory Visit: Payer: PPO | Admitting: Gastroenterology

## 2017-05-11 ENCOUNTER — Ambulatory Visit: Payer: PPO | Admitting: Sports Medicine

## 2017-05-11 ENCOUNTER — Encounter: Payer: Self-pay | Admitting: Sports Medicine

## 2017-05-11 DIAGNOSIS — E1140 Type 2 diabetes mellitus with diabetic neuropathy, unspecified: Secondary | ICD-10-CM | POA: Diagnosis not present

## 2017-05-11 DIAGNOSIS — E11610 Type 2 diabetes mellitus with diabetic neuropathic arthropathy: Secondary | ICD-10-CM

## 2017-05-11 DIAGNOSIS — E1149 Type 2 diabetes mellitus with other diabetic neurological complication: Secondary | ICD-10-CM

## 2017-05-11 DIAGNOSIS — L97511 Non-pressure chronic ulcer of other part of right foot limited to breakdown of skin: Secondary | ICD-10-CM

## 2017-05-11 NOTE — Progress Notes (Signed)
Patient ID: Nicholas [name], male   DOB: 1955/10/27, 61 y.o.   MRN: [id number]  Subjective: Nicholas [name] is a 61 y.o. Diabetic male patient seen in office for follow up of wound at right 1st toe, states that it has been dry and not bleeding has been applying antibiotic cream and vasaline with improvement. States that the compression/surgi-tube stockinettes help. Patient also desires to get an update on the status of his diabetic shoes. Denies any other symptoms.    Patient Active Problem List   Diagnosis Date Noted  . Neck pain on left side 04/10/2017  . Dental caries 04/10/2017  . Hypothyroidism 08/19/2016  . GERD (gastroesophageal reflux disease) 04/14/2016  . Charcot foot due to diabetes mellitus ([place]) 01/28/2016  . Benign essential tremor 01/28/2016  . Skin lesion of back 10/07/2015  . Back pain with left-sided radiculopathy 06/05/2015  . Hyperlipidemia 04/10/2015  . Abdominal wall bulge 01/15/2015  . Hx of adenomatous colonic polyps 02/21/2014  . Allergic rhinitis 07/24/2013  . Onychomycosis 05/21/2013  . 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 Back pain with radiation 04/20/2011  . Seborrheic dermatitis 11/20/2010  . Dermatomycosis 11/20/2010  . FATIGUE 04/01/2010  . CARPAL TUNNEL SYNDROME, BILATERAL 03/24/2010  . Hypogonadism in male 01/27/2010  . DEPRESSION/ANXIETY 01/27/2010  . CHF 01/13/2010  . DYSPNEA 01/13/2010  . Diabetes mellitus type 2 in obese ([place]) 12/12/2009  . MORBID OBESITY 12/12/2009  . Benign hypertension 12/12/2009  . PVD (peripheral vascular disease) ([place]) 12/12/2009   Current Outpatient Medications on File Prior to Visit  Medication Sig Dispense Refill  . ANDROGEL PUMP 20.25 MG/ACT (1.62%) GEL Apply 1 application topically daily.     Marland Kitchen aspirin EC 81 MG tablet Take 81 mg by mouth daily.    . brimonidine-timolol (COMBIGAN) 0.2-0.5 % ophthalmic solution Place 1 drop into both eyes at bedtime. One drop in each eye two  times a day      . cephALEXin (KEFLEX) 500 MG capsule Take 1 capsule (500 mg total) by mouth 3 (three) times daily. 30 capsule 0  . citalopram (CELEXA) 40 MG tablet TAKE 1 TABLET BY MOUTH ONCE A DAY. 90 tablet 0  . cyclobenzaprine (FLEXERIL) 10 MG tablet Take 1 tablet (10 mg total) by mouth 3 (three) times daily. (Patient taking differently: Take 10 mg by mouth 3 (three) times daily as needed for muscle spasms. ) 20 tablet 0  . gabapentin (NEURONTIN) 600 MG tablet TAKE ONE TABLET BY MOUTH 3 TIMES DAILY. 270 tablet 1  . HUMULIN R U-500 KWIKPEN 500 UNIT/ML kwikpen INJECT UP TO 100 UNITS SUBCUTANEOUSLY 30 MINUTES BEFORE MEALS. 18 mL 0  . hydrochlorothiazide (HYDRODIURIL) 25 MG tablet Take 1 tablet (25 mg total) by mouth daily. 30 tablet 3  . ketoconazole (NIZORAL) 2 % shampoo APPLY TOPICALLY TWICE WEEKLY AS DIRECTED. 120 mL 0  . lamoTRIgine (LAMICTAL) 200 MG tablet TAKE 1 TABLET BY MOUTH ONCE A DAY. 90 tablet 1  . levothyroxine (SYNTHROID, LEVOTHROID) 88 MCG tablet Take 1 tablet (88 mcg total) by mouth daily before breakfast. 30 tablet 3  . lisinopril (PRINIVIL,ZESTRIL) 5 MG tablet TAKE 1 TABLET BY MOUTH ONCE A DAY. 90 tablet 1  . metoprolol succinate (TOPROL-XL) 25 MG 24 hr tablet TAKE (1) TABLET BY MOUTH ONCE DAILY. 90 tablet 1  . naproxen (NAPROSYN) 500 MG tablet TAKE ONE TABLET BY MOUTH TWICE DAILY WITH A MEAL. 60 tablet 5  . NOVOFINE 30G X 8 MM MISC     .  pantoprazole (PROTONIX) 40 MG tablet TAKE (1) TABLET BY MOUTH TWICE DAILY. 180 tablet 3  . predniSONE (STERAPRED UNI-PAK 21 TAB) 5 MG (21) TBPK tablet Take 1 tablet (5 mg total) by mouth as directed. Use as directed 21 tablet 0  . simvastatin (ZOCOR) 40 MG tablet TAKE 1 TABLET BY MOUTH ONCE A DAY. 90 tablet 1  . SURE COMFORT PEN NEEDLES 31G X 8 MM MISC USE TWICE DAILY WITH BYETTA 100 each 2  . traMADol (ULTRAM) 50 MG tablet TAKE 2 TABLETS BY MOUTH EVERY 8 HOURS AS NEEDED. 180 tablet 0  . TRAVATAN Z 0.004 % SOLN ophthalmic solution Place 1 drop  into both eyes at bedtime.     . triamcinolone ointment (KENALOG) 0.1 % APPLY TO AFFECTED AREAS TWICE DAILY. 454 g 0  . VICTOZA 18 MG/3ML SOPN INJECT 1.8 MG SUBCUTANEOUSLY ONCE DAILY. 9 mL 2  . Vitamin D, Ergocalciferol, (DRISDOL) 50000 units CAPS capsule TAKE ONE CAPSULE BY MOUTH ONCE WEEKLY. 12 capsule 0   Current Facility-Administered Medications on File Prior to Visit  Medication Dose Route Frequency Provider Last Rate Last Dose  . triamcinolone acetonide (KENALOG-40) injection 20 mg  20 mg Other Once Landis Martins, DPM       Allergies  Allergen Reactions  . Dilaudid [Hydromorphone Hcl] Shortness Of Breath    Recent Results (from the past 2160 hour(s))  Comprehensive metabolic panel     Status: Abnormal   Collection Time: 02/22/17  9:04 AM  Result Value Ref Range   Sodium 138 135 - 146 mmol/L   Potassium 5.2 3.5 - 5.3 mmol/L   Chloride 104 98 - 110 mmol/L   CO2 28 20 - 32 mmol/L    Comment: ** Please note change in reference range(s). **      Glucose, Bld 178 (H) 65 - 99 mg/dL   BUN 22 7 - 25 mg/dL   Creat 1.25 0.70 - 1.25 mg/dL    Comment:   For patients > or = 62 years of age: The upper reference limit for Creatinine is approximately 13% higher for people identified as African-American.      Total Bilirubin 0.5 0.2 - 1.2 mg/dL   Alkaline Phosphatase 78 40 - 115 U/L   AST 17 10 - 35 U/L   ALT 17 9 - 46 U/L   Total Protein 6.2 6.1 - 8.1 g/dL   Albumin 3.9 3.6 - 5.1 g/dL   Calcium 9.0 8.6 - 10.3 mg/dL  Hemoglobin A1c     Status: Abnormal   Collection Time: 02/22/17  9:04 AM  Result Value Ref Range   Hgb A1c MFr Bld 8.0 (H) <5.7 %    Comment:   For someone without known diabetes, a hemoglobin A1c value of 6.5% or greater indicates that they may have diabetes and this should be confirmed with a follow-up test.   For someone with known diabetes, a value <7% indicates that their diabetes is well controlled and a value greater than or equal to 7% indicates  suboptimal control. A1c targets should be individualized based on duration of diabetes, age, comorbid conditions, and other considerations.   Currently, no consensus exists for use of hemoglobin A1c for diagnosis of diabetes for children.      Mean Plasma Glucose 183 mg/dL    Objective: There were no vitals filed for this visit.  General: Patient is awake, alert, oriented x 3 and in no acute distress.  Dermatology: Skin is warm and dry bilateral with a healed  ulceration at right hallux with reactive keratosis no signs of acute infection at site. Nails are elongated and thick with no signs of ingrowing or infection.    Vascular: Dorsalis Pedis pulse = 0/4 Bilateral,  Posterior Tibial pulse = 1/4 Bilateral faint,  Capillary Fill Time < 5 seconds, + chronic venous skin changes bilateral.  Neurologic:Protective sensation diminished using the 5.07/10g Semmes Weinstein Monofilament. Vibratory diminished bilateral.   Musculosketal:  No pain to plantar medial and dorsal midfoot on left with accompanying prominent bones supportive of Charcot. No Pain with palpation to healed ulcerated area at right hallux. + Hallux interphalangeus R>L hallux, No pain with compression to calves bilateral.   Assessment and Plan:  Problem List Items Addressed This Visit      Endocrine   Charcot foot due to diabetes mellitus ([place])    Other Visit Diagnoses    Toe ulcer, right, limited to breakdown of skin ([place])    -  Primary   prematurely healed    Type II diabetes mellitus with neurological manifestations ([place])       Diabetic neuropathy with neurologic complication ([place])         -Examined patient  -Mechanically reactive callus at right 1st toe using sterile chisel blade and dressed with antibiotic cream and bandaid. Advised patient to do the same until blood under callus has resolved  -Continue with gabapentin  -Patient awaiting diabetic shoes  -Patient to return to office in 12 weeks for diabetic foot  care or sooner if problems arise.  Landis Martins, DPM

## 2017-05-15 ENCOUNTER — Other Ambulatory Visit: Payer: Self-pay | Admitting: Family Medicine

## 2017-05-19 ENCOUNTER — Other Ambulatory Visit: Payer: Self-pay | Admitting: Family Medicine

## 2017-05-21 ENCOUNTER — Other Ambulatory Visit: Payer: Self-pay | Admitting: "Endocrinology

## 2017-05-25 ENCOUNTER — Other Ambulatory Visit: Payer: Self-pay | Admitting: Family Medicine

## 2017-05-26 DIAGNOSIS — E039 Hypothyroidism, unspecified: Secondary | ICD-10-CM | POA: Diagnosis not present

## 2017-05-26 DIAGNOSIS — E1169 Type 2 diabetes mellitus with other specified complication: Secondary | ICD-10-CM | POA: Diagnosis not present

## 2017-05-26 DIAGNOSIS — E669 Obesity, unspecified: Secondary | ICD-10-CM | POA: Diagnosis not present

## 2017-05-27 LAB — RENAL FUNCTION PANEL
ALBUMIN MSPROF: 3.8 g/dL (ref 3.6–5.1)
BUN / CREAT RATIO: 18 (calc) (ref 6–22)
BUN: 23 mg/dL (ref 7–25)
CHLORIDE: 103 mmol/L (ref 98–110)
CO2: 32 mmol/L (ref 20–32)
CREATININE: 1.31 mg/dL — AB (ref 0.70–1.25)
Calcium: 9 mg/dL (ref 8.6–10.3)
Glucose, Bld: 142 mg/dL — ABNORMAL HIGH (ref 65–139)
PHOSPHORUS: 3.7 mg/dL (ref 2.5–4.5)
Potassium: 5.2 mmol/L (ref 3.5–5.3)
SODIUM: 140 mmol/L (ref 135–146)

## 2017-05-27 LAB — HEMOGLOBIN A1C
EAG (MMOL/L): 10.1 (calc)
Hgb A1c MFr Bld: 8 % of total Hgb — ABNORMAL HIGH (ref <5.7)
Mean Plasma Glucose: 183 (calc)

## 2017-05-27 LAB — T4, FREE: FREE T4: 1.1 ng/dL (ref 0.8–1.8)

## 2017-05-27 LAB — TSH: TSH: 1.44 mIU/L (ref 0.40–4.50)

## 2017-06-01 ENCOUNTER — Other Ambulatory Visit: Payer: Self-pay | Admitting: "Endocrinology

## 2017-06-01 ENCOUNTER — Other Ambulatory Visit: Payer: Self-pay

## 2017-06-01 ENCOUNTER — Telehealth: Payer: Self-pay | Admitting: Family Medicine

## 2017-06-01 DIAGNOSIS — I10 Essential (primary) hypertension: Secondary | ICD-10-CM

## 2017-06-01 DIAGNOSIS — E782 Mixed hyperlipidemia: Secondary | ICD-10-CM | POA: Diagnosis not present

## 2017-06-01 LAB — HEPATIC FUNCTION PANEL
AG RATIO: 1.5 (calc) (ref 1.0–2.5)
ALBUMIN MSPROF: 3.8 g/dL (ref 3.6–5.1)
ALT: 20 U/L (ref 9–46)
AST: 22 U/L (ref 10–35)
Alkaline phosphatase (APISO): 79 U/L (ref 40–115)
BILIRUBIN INDIRECT: 0.4 mg/dL (ref 0.2–1.2)
Bilirubin, Direct: 0.1 mg/dL (ref 0.0–0.2)
GLOBULIN: 2.6 g/dL (ref 1.9–3.7)
TOTAL PROTEIN: 6.4 g/dL (ref 6.1–8.1)
Total Bilirubin: 0.5 mg/dL (ref 0.2–1.2)

## 2017-06-01 LAB — CBC
HEMATOCRIT: 43 % (ref 38.5–50.0)
HEMOGLOBIN: 14.1 g/dL (ref 13.2–17.1)
MCH: 28.1 pg (ref 27.0–33.0)
MCHC: 32.8 g/dL (ref 32.0–36.0)
MCV: 85.7 fL (ref 80.0–100.0)
MPV: 11.6 fL (ref 7.5–12.5)
Platelets: 253 10*3/uL (ref 140–400)
RBC: 5.02 10*6/uL (ref 4.20–5.80)
RDW: 15.6 % — AB (ref 11.0–15.0)
WBC: 13.3 10*3/uL — AB (ref 3.8–10.8)

## 2017-06-01 LAB — LIPID PANEL
Cholesterol: 164 mg/dL (ref <200)
HDL: 50 mg/dL (ref >40)
LDL Cholesterol (Calc): 86 mg/dL (calc)
NON-HDL CHOLESTEROL (CALC): 114 mg/dL (ref <130)
Total CHOL/HDL Ratio: 3.3 (calc) (ref <5.0)
Triglycerides: 181 mg/dL — ABNORMAL HIGH (ref <150)

## 2017-06-01 MED ORDER — HYDROCHLOROTHIAZIDE 25 MG PO TABS: 25.0000 mg | ORAL_TABLET | Freq: Every day | ORAL | 1 refills | Status: DC

## 2017-06-01 NOTE — Telephone Encounter (Signed)
Patient is requesting a refill on his fluid pill per message on voicemail. Cb#: [phone]

## 2017-06-01 NOTE — Telephone Encounter (Signed)
Refill sent in to [facility]

## 2017-06-03 ENCOUNTER — Ambulatory Visit: Payer: PPO | Admitting: Gastroenterology

## 2017-06-03 ENCOUNTER — Ambulatory Visit: Payer: PPO | Admitting: "Endocrinology

## 2017-06-07 ENCOUNTER — Ambulatory Visit: Payer: PPO | Admitting: Orthotics

## 2017-06-09 ENCOUNTER — Ambulatory Visit: Payer: PPO | Admitting: Family Medicine

## 2017-06-12 ENCOUNTER — Other Ambulatory Visit: Payer: Self-pay | Admitting: "Endocrinology

## 2017-06-14 ENCOUNTER — Encounter: Payer: Self-pay | Admitting: *Deleted

## 2017-06-14 ENCOUNTER — Other Ambulatory Visit: Payer: Self-pay | Admitting: *Deleted

## 2017-06-14 ENCOUNTER — Encounter: Payer: Self-pay | Admitting: Gastroenterology

## 2017-06-14 ENCOUNTER — Ambulatory Visit: Payer: PPO | Admitting: Gastroenterology

## 2017-06-14 ENCOUNTER — Telehealth: Payer: Self-pay | Admitting: *Deleted

## 2017-06-14 VITALS — BP 109/64 | HR 70 | Temp 97.2°F | Ht 72.0 in | Wt >= 6400 oz

## 2017-06-14 DIAGNOSIS — K2100 Gastro-esophageal reflux disease with esophagitis, without bleeding: Secondary | ICD-10-CM

## 2017-06-14 DIAGNOSIS — Z86010 Personal history of colonic polyps: Secondary | ICD-10-CM

## 2017-06-14 MED ORDER — NA SULFATE-K SULFATE-MG SULF 17.5-3.13-1.6 GM/177ML PO SOLN: 1.0000 | ORAL | 0 refills | Status: DC

## 2017-06-14 MED ORDER — PEG 3350-KCL-NA BICARB-NACL 420 G PO SOLR: 4000.0000 mL | Freq: Once | ORAL | 0 refills | Status: AC

## 2017-06-14 NOTE — Telephone Encounter (Signed)
Called spoke with pt and is aware pre-op scheduled for 07/23/17 at 10:00am. Letter mailed also to pt.

## 2017-06-14 NOTE — Patient Instructions (Addendum)
1. Colonoscopy as scheduled. See separate instructions.  2. Continue pantoprazole for acid reflux.  3. Return to the office in one year.

## 2017-06-14 NOTE — Progress Notes (Signed)
Primary Care Physician:  Fayrene Helper, MD  Primary Gastroenterologist:  Garfield Cornea, MD   Chief Complaint  Patient presents with  . Colonoscopy    3 yr tcs due    HPI:  Nicholas [name] is a 61 y.o. male here to schedule surveillance colonoscopy.  He also has a history of GERD.  Last seen October 2017.  Patient's last colonoscopy was in September 2015.  Multiple colon polyps (tubular adenomas) removed throughout the colon largest measuring 6 mm. Advised for 3 year surveillance colonoscopy.  Clinically patient is doing well from a GI standpoint.  Bowel movements are regular.  No blood in the stool or melena.  No abdominal pain.  No heartburn as long as he takes his pantoprazole twice daily.  No dysphagia.  Since we last saw him he is developed neuropathy in his foot.  He has had a couple of falls.  Patient states he has some difficulty waking up after his gallbladder surgery in 2016.  According to anesthesia notes there were no apparent complications.  Current Outpatient Medications  Medication Sig Dispense Refill  . ANDROGEL PUMP 20.25 MG/ACT (1.62%) GEL Apply 1 application topically daily.     Marland Kitchen aspirin EC 81 MG tablet Take 81 mg by mouth daily.    . brimonidine-timolol (COMBIGAN) 0.2-0.5 % ophthalmic solution Place 1 drop into both eyes at bedtime. One drop in each eye two times a day      . citalopram (CELEXA) 40 MG tablet TAKE 1 TABLET BY MOUTH ONCE A DAY. 90 tablet 0  . cyclobenzaprine (FLEXERIL) 10 MG tablet Take 1 tablet (10 mg total) by mouth 3 (three) times daily. (Patient taking differently: Take 10 mg by mouth 3 (three) times daily as needed for muscle spasms. ) 20 tablet 0  . cyclobenzaprine (FLEXERIL) 10 MG tablet TAKE (1) TABLET BY MOUTH EVERY EIGHT HOURS AS NEEDED FOR MUSCLE SPASMS. 30 tablet 0  . gabapentin (NEURONTIN) 600 MG tablet TAKE ONE TABLET BY MOUTH 3 TIMES DAILY. 270 tablet 1  . HUMULIN R U-500 KWIKPEN 500 UNIT/ML kwikpen INJECT UP TO 100 UNITS  SUBCUTANEOUSLY 30 MINUTES BEFORE MEALS. 18 mL 0  . hydrochlorothiazide (HYDRODIURIL) 25 MG tablet Take 1 tablet (25 mg total) by mouth daily. 30 tablet 1  . ketoconazole (NIZORAL) 2 % shampoo APPLY TOPICALLY TWICE WEEKLY AS DIRECTED. 120 mL 0  . lamoTRIgine (LAMICTAL) 200 MG tablet TAKE 1 TABLET BY MOUTH ONCE A DAY. 30 tablet 5  . levothyroxine (SYNTHROID, LEVOTHROID) 88 MCG tablet TAKE ONE TABLET BY MOUTH DAILY BEFORE BREAKFAST. 30 tablet 3  . lisinopril (PRINIVIL,ZESTRIL) 5 MG tablet TAKE 1 TABLET BY MOUTH ONCE A DAY. 90 tablet 1  . metoprolol succinate (TOPROL-XL) 25 MG 24 hr tablet TAKE (1) TABLET BY MOUTH ONCE DAILY. 90 tablet 1  . naproxen (NAPROSYN) 500 MG tablet TAKE ONE TABLET BY MOUTH TWICE DAILY WITH A MEAL. 60 tablet 5  . NOVOFINE 30G X 8 MM MISC     . pantoprazole (PROTONIX) 40 MG tablet TAKE (1) TABLET BY MOUTH TWICE DAILY. 180 tablet 3  . simvastatin (ZOCOR) 40 MG tablet TAKE 1 TABLET BY MOUTH ONCE A DAY. 90 tablet 1  . SURE COMFORT PEN NEEDLES 31G X 8 MM MISC USE TWICE DAILY WITH BYETTA 100 each 2  . traMADol (ULTRAM) 50 MG tablet TAKE 2 TABLETS BY MOUTH EVERY 8 HOURS AS NEEDED. 180 tablet 0  . TRAVATAN Z 0.004 % SOLN ophthalmic solution Place 1 drop into both  eyes at bedtime.     . triamcinolone ointment (KENALOG) 0.1 % APPLY TO AFFECTED AREAS TWICE DAILY. 454 g 0  . VICTOZA 18 MG/3ML SOPN INJECT 1.8 MG SUBCUTANEOUSLY ONCE DAILY. 9 mL 2  . Vitamin D, Ergocalciferol, (DRISDOL) 50000 units CAPS capsule TAKE ONE CAPSULE BY MOUTH ONCE WEEKLY. 12 capsule 0   Current Facility-Administered Medications  Medication Dose Route Frequency Provider Last Rate Last Dose  . triamcinolone acetonide (KENALOG-40) injection 20 mg  20 mg Other Once Landis Martins, DPM        Allergies as of 06/14/2017 - Review Complete 06/14/2017  Allergen Reaction Noted  . Dilaudid [hydromorphone hcl] Shortness Of Breath 06/11/2016    Past Medical History:  Diagnosis Date  . Chronic knee pain   .  Depression   . Diabetes ([place])   . Glaucoma   . Hyperlipidemia   . Hypertension   . MRSA (methicillin resistant Staphylococcus aureus)   . Neuropathy   . Poor circulation    leg   . Suicide attempt ([place])   . Testosterone deficiency     Past Surgical History:  Procedure Laterality Date  . CHOLECYSTECTOMY N/A 02/25/2015   Procedure: LAPAROSCOPIC CHOLECYSTECTOMY;  Surgeon: Aviva Signs, MD;  Location: [facility];  Service: General;  Laterality: N/A;  . COLONOSCOPY  03/2008   [id number] external hemorrhoidal tag, otherwise normal rectum/Two diminutive rectosigmoid polyps s/p bx/ Polyp in the opposite the ileocecal valve s/p bx. tubular adenoma.  . COLONOSCOPY N/A 03/23/2014   [id number] colon polyps throughout his rectum and colon. Sessile and pedunculated. The largest polyp was proximally 6 mm in dimensions in the retum at 3 cm with adjacent diminutive polyp.  There was also a polyps on ileocecal valve, ascending segmen descending and sigmoid segment. as the colon was tortuous & elongated requiring externa abd pressure and changing of the pts postion to reach cecum  . ESOPHAGOGASTRODUODENOSCOPY N/A 03/23/2014   RMR: 1. Patient had circumferential distal esophageal erosions within 5 mm of the GE junction.  No Barrett's esophagus. Tubular esophagus patent throughout its course. 2.  Diffuse gastric erosions. (1) 36mm area of healing ulceration in the antrum. No ulcer or infiltrating process observed. Patent pylorus. Normal-appearing first and second portion of the duodenum .  . kidney stones     cysto with litholapexy    Family History  Problem Relation Age of Onset  . Diabetes Mother   . Leukemia Mother   . Cancer Father        colon, age greater than 4?  Marland Kitchen Heart disease Father   . Glaucoma Father   . Parkinsonism Father        brain hemorrhage  . Alzheimer's disease Father   . Glaucoma Sister   . Dementia Sister   . Colon cancer Neg Hx     Social History   Socioeconomic History  .  Marital status: Divorced    Spouse name: Not on file  . Number of children: 0  . Years of education: 17  . Highest education level: Not on file  Social Needs  . Financial resource strain: Not on file  . Food insecurity - worry: Not on file  . Food insecurity - inability: Not on file  . Transportation needs - medical: Not on file  . Transportation needs - non-medical: Not on file  Occupational History  . Occupation: DISABLED    Employer: UNEMPLOYED  Tobacco Use  . Smoking status: Former Smoker    Packs/day: 1.50    Years:  25.00    Pack years: 37.50    Types: Cigarettes    Last attempt to quit: 02/20/2002    Years since quitting: 15.3  . Smokeless tobacco: Former Systems developer    Types: Snuff    Quit date: 11/02/1980  . Tobacco comment: Quit x 15 years  Substance and Sexual Activity  . Alcohol use: No  . Drug use: No  . Sexual activity: Not Currently    Birth control/protection: None  Other Topics Concern  . Not on file  Social History Narrative  . Not on file      ROS:  General: Negative for anorexia, weight loss, fever, chills, fatigue, weakness. Eyes: Negative for vision changes.  ENT: Negative for hoarseness, difficulty swallowing , nasal congestion. CV: Negative for chest pain, angina, palpitations, dyspnea on exertion, peripheral edema.  Respiratory: Negative for dyspnea at rest, dyspnea on exertion, cough, sputum, wheezing.  GI: See history of present illness. GU:  Negative for dysuria, hematuria, urinary incontinence, urinary frequency, nocturnal urination.  MS: complains of joint pain.  No back pain.  Derm: Negative for rash or itching.  Neuro: Negative for weakness, abnormal sensation, seizure, frequent headaches, memory loss, confusion.  Psych: Negative for anxiety, depression, suicidal ideation, hallucinations.  Endo: Negative for unusual weight change.  Heme: Negative for bruising or bleeding. Allergy: Negative for rash or hives.    Physical Examination:  BP  109/64   Pulse 70   Temp (!) 97.2 F (36.2 C) (Oral)   Ht 6' (1.829 m)   Wt (!) 426 lb 3.2 oz (193.3 kg)   BMI 57.80 kg/m    General: Well-nourished, well-developed in no acute distress.  Head: Normocephalic, atraumatic.   Eyes: Conjunctiva pink, no icterus. Mouth: Oropharyngeal mucosa moist and pink , no lesions erythema or exudate. Neck: Supple without thyromegaly, masses, or lymphadenopathy.  Lungs: Clear to auscultation bilaterally.  Heart: Regular rate and rhythm, no murmurs rubs or gallops.  Abdomen: Bowel sounds are normal, nontender, nondistended, no hepatosplenomegaly or masses, no abdominal bruits or    hernia , no rebound or guarding.  Body habitus limits exam Rectal: Not performed Extremities: No lower extremity edema. No clubbing or deformities.  Neuro: Alert and oriented x 4 , grossly normal neurologically.  Skin: Warm and dry, no rash or jaundice.   Psych: Alert and cooperative, normal mood and affect.  Labs: Lab Results  Component Value Date   CREATININE 1.31 (H) 05/26/2017   BUN 23 05/26/2017   NA 140 05/26/2017   K 5.2 05/26/2017   CL 103 05/26/2017   CO2 32 05/26/2017   Lab Results  Component Value Date   ALT 20 06/01/2017   AST 22 06/01/2017   ALKPHOS 78 06/01/2017   BILITOT 0.5 06/01/2017   Lab Results  Component Value Date   WBC 13.3 (H) 06/01/2017   HGB 14.1 06/01/2017   HCT 43.0 06/01/2017   MCV 85.7 06/01/2017   PLT 253 06/01/2017   Lab Results  Component Value Date   TSH 1.44 05/26/2017   Lab Results  Component Value Date   HGBA1C 8.0 (H) 05/26/2017     Imaging Studies: No results found.

## 2017-06-14 NOTE — Assessment & Plan Note (Signed)
Controlled on medication.  Reinforced antireflux measures.  Return to the office in 1 year or sooner if needed.

## 2017-06-14 NOTE — Assessment & Plan Note (Signed)
Surveillance colonoscopy due at this time.  Plan for deep sedation with help with of anesthesiology given polypharmacy.  I have discussed the risks, alternatives, benefits with regards to but not limited to the risk of reaction to medication, bleeding, infection, perforation and the patient is agreeable to proceed. Written consent to be obtained.

## 2017-06-14 NOTE — Telephone Encounter (Signed)
Received fax from pharmacy. Pt has low income subsidy and needs to change prep to suprep at no cost to the pt.  Called patient and made aware we are changing prep and I am mailing him new prep instructions. He verbalized understanding.

## 2017-06-14 NOTE — Progress Notes (Signed)
CC'ED TO PCP 

## 2017-06-17 ENCOUNTER — Other Ambulatory Visit: Payer: Self-pay | Admitting: Family Medicine

## 2017-06-23 ENCOUNTER — Ambulatory Visit: Payer: PPO | Admitting: "Endocrinology

## 2017-06-23 ENCOUNTER — Other Ambulatory Visit: Payer: Self-pay | Admitting: "Endocrinology

## 2017-06-23 ENCOUNTER — Encounter: Payer: Self-pay | Admitting: "Endocrinology

## 2017-06-23 VITALS — BP 122/74 | HR 83 | Wt >= 6400 oz

## 2017-06-23 DIAGNOSIS — I10 Essential (primary) hypertension: Secondary | ICD-10-CM | POA: Diagnosis not present

## 2017-06-23 DIAGNOSIS — E039 Hypothyroidism, unspecified: Secondary | ICD-10-CM | POA: Diagnosis not present

## 2017-06-23 DIAGNOSIS — E669 Obesity, unspecified: Secondary | ICD-10-CM | POA: Diagnosis not present

## 2017-06-23 DIAGNOSIS — E1121 Type 2 diabetes mellitus with diabetic nephropathy: Secondary | ICD-10-CM

## 2017-06-23 DIAGNOSIS — E1169 Type 2 diabetes mellitus with other specified complication: Secondary | ICD-10-CM

## 2017-06-23 NOTE — Progress Notes (Signed)
Subjective:    Patient ID: Nicholas [name], male    DOB: Sep 18, 1955,    Past Medical History:  Diagnosis Date  . Chronic knee pain   . Depression   . Diabetes ([place])   . Glaucoma   . Hyperlipidemia   . Hypertension   . MRSA (methicillin resistant Staphylococcus aureus)   . Neuropathy   . Poor circulation    leg   . Suicide attempt ([place])   . Testosterone deficiency    Past Surgical History:  Procedure Laterality Date  . CHOLECYSTECTOMY N/A 02/25/2015   Procedure: LAPAROSCOPIC CHOLECYSTECTOMY;  Surgeon: Aviva Signs, MD;  Location: [facility];  Service: General;  Laterality: N/A;  . COLONOSCOPY  03/2008   [id number] external hemorrhoidal tag, otherwise normal rectum/Two diminutive rectosigmoid polyps s/p bx/ Polyp in the opposite the ileocecal valve s/p bx. tubular adenoma.  . COLONOSCOPY N/A 03/23/2014   [id number] colon polyps throughout his rectum and colon. Sessile and pedunculated. The largest polyp was proximally 6 mm in dimensions in the retum at 3 cm with adjacent diminutive polyp.  There was also a polyps on ileocecal valve, ascending segmen descending and sigmoid segment. as the colon was tortuous & elongated requiring externa abd pressure and changing of the pts postion to reach cecum  . ESOPHAGOGASTRODUODENOSCOPY N/A 03/23/2014   RMR: 1. Patient had circumferential distal esophageal erosions within 5 mm of the GE junction.  No Barrett's esophagus. Tubular esophagus patent throughout its course. 2.  Diffuse gastric erosions. (1) 32m area of healing ulceration in the antrum. No ulcer or infiltrating process observed. Patent pylorus. Normal-appearing first and second portion of the duodenum .  . kidney stones     cysto with litholapexy   Social History   Socioeconomic History  . Marital status: Divorced    Spouse name: None  . Number of children: 0  . Years of education: 156 . Highest education level: None  Social Needs  . Financial resource strain: None  . Food  insecurity - worry: None  . Food insecurity - inability: None  . Transportation needs - medical: None  . Transportation needs - non-medical: None  Occupational History  . Occupation: DISABLED    Employer: UNEMPLOYED  Tobacco Use  . Smoking status: Former Smoker    Packs/day: 1.50    Years: 25.00    Pack years: 37.50    Types: Cigarettes    Last attempt to quit: 02/20/2002    Years since quitting: 15.3  . Smokeless tobacco: Former [employer] developer   Types: Snuff    Quit date: 11/02/1980  . Tobacco comment: Quit x 15 years  Substance and Sexual Activity  . Alcohol use: No  . Drug use: No  . Sexual activity: Not Currently    Birth control/protection: None  Other Topics Concern  . None  Social History Narrative  . None   Outpatient Encounter Medications as of 06/23/2017  Medication Sig  . ANDROGEL PUMP 20.25 MG/ACT (1.62%) GEL Apply 1 application topically daily.   .Marland Kitchenaspirin EC 81 MG tablet Take 81 mg by mouth daily.  . brimonidine-timolol (COMBIGAN) 0.2-0.5 % ophthalmic solution Place 1 drop into both eyes at bedtime. One drop in each eye two times a day    . citalopram (CELEXA) 40 MG tablet TAKE 1 TABLET BY MOUTH ONCE A DAY.  . cyclobenzaprine (FLEXERIL) 10 MG tablet Take 1 tablet (10 mg total) by mouth 3 (three) times daily. (Patient taking differently: Take 10 mg by mouth  3 (three) times daily as needed for muscle spasms. )  . cyclobenzaprine (FLEXERIL) 10 MG tablet TAKE (1) TABLET BY MOUTH EVERY EIGHT HOURS AS NEEDED FOR MUSCLE SPASMS.  Marland Kitchen gabapentin (NEURONTIN) 600 MG tablet TAKE ONE TABLET BY MOUTH 3 TIMES DAILY.  Marland Kitchen HUMULIN R U-500 KWIKPEN 500 UNIT/ML kwikpen INJECT UP TO 100 UNITS SUBCUTANEOUSLY 30 MINUTES BEFORE MEALS.  . hydrochlorothiazide (HYDRODIURIL) 25 MG tablet Take 1 tablet (25 mg total) by mouth daily.  Marland Kitchen ketoconazole (NIZORAL) 2 % shampoo APPLY TOPICALLY TWICE WEEKLY AS DIRECTED.  Marland Kitchen lamoTRIgine (LAMICTAL) 200 MG tablet TAKE 1 TABLET BY MOUTH ONCE A DAY.  Marland Kitchen levothyroxine  (SYNTHROID, LEVOTHROID) 88 MCG tablet TAKE ONE TABLET BY MOUTH DAILY BEFORE BREAKFAST.  Marland Kitchen lisinopril (PRINIVIL,ZESTRIL) 5 MG tablet TAKE 1 TABLET BY MOUTH ONCE A DAY.  Marland Kitchen lisinopril (PRINIVIL,ZESTRIL) 5 MG tablet TAKE 1 TABLET BY MOUTH ONCE A DAY.  . metoprolol succinate (TOPROL-XL) 25 MG 24 hr tablet TAKE (1) TABLET BY MOUTH ONCE DAILY.  . Na Sulfate-K Sulfate-Mg Sulf 17.5-3.13-1.6 GM/177ML SOLN Take 1 kit by mouth as directed.  . naproxen (NAPROSYN) 500 MG tablet TAKE ONE TABLET BY MOUTH TWICE DAILY WITH A MEAL.  Marland Kitchen NOVOFINE 30G X 8 MM MISC   . pantoprazole (PROTONIX) 40 MG tablet TAKE (1) TABLET BY MOUTH TWICE DAILY.  . simvastatin (ZOCOR) 40 MG tablet TAKE 1 TABLET BY MOUTH ONCE A DAY.  . SURE COMFORT PEN NEEDLES 31G X 8 MM MISC USE TWICE DAILY WITH BYETTA  . traMADol (ULTRAM) 50 MG tablet TAKE 2 TABLETS BY MOUTH EVERY 8 HOURS AS NEEDED.  Marland Kitchen TRAVATAN Z 0.004 % SOLN ophthalmic solution Place 1 drop into both eyes at bedtime.   . triamcinolone ointment (KENALOG) 0.1 % APPLY TO AFFECTED AREAS TWICE DAILY.  Marland Kitchen VICTOZA 18 MG/3ML SOPN INJECT 1.8 MG SUBCUTANEOUSLY ONCE DAILY.  Marland Kitchen Vitamin D, Ergocalciferol, (DRISDOL) 50000 units CAPS capsule TAKE ONE CAPSULE BY MOUTH ONCE WEEKLY.   Facility-Administered Encounter Medications as of 06/23/2017  Medication  . triamcinolone acetonide (KENALOG-40) injection 20 mg   ALLERGIES: Allergies  Allergen Reactions  . Dilaudid [Hydromorphone Hcl] Shortness Of Breath    Patient denies   VACCINATION STATUS: Immunization History  Administered Date(s) Administered  . Influenza Whole 04/01/2010  . Influenza,inj,Quad PF,6+ Mos 03/08/2013  . Pneumococcal Conjugate-13 07/09/2014  . Pneumococcal Polysaccharide-23 04/01/2010, 10/07/2015  . Tdap 11/24/2011  . Zoster 12/12/2014    Diabetes  He presents for his follow-up diabetic visit. He has type 2 diabetes mellitus. His disease course has been worsening. There are no hypoglycemic associated symptoms. Pertinent  negatives for hypoglycemia include no confusion, headaches, pallor or seizures. There are no diabetic associated symptoms. Pertinent negatives for diabetes include no chest pain, no fatigue, no polydipsia, no polyphagia, no polyuria and no weakness. There are no hypoglycemic complications. Symptoms are worsening. Diabetic complications include impotence and peripheral neuropathy. Risk factors for coronary artery disease include diabetes mellitus, dyslipidemia, family history, hypertension, male sex, obesity, sedentary lifestyle and tobacco exposure. He is compliant with treatment most of the time. His weight is stable. He is following a generally unhealthy diet. He rarely participates in exercise. His home blood glucose trend is decreasing steadily. His breakfast blood glucose range is generally 180-200 mg/dl. His lunch blood glucose range is generally 180-200 mg/dl. His dinner blood glucose range is generally 180-200 mg/dl. His overall blood glucose range is 180-200 mg/dl. An ACE inhibitor/angiotensin II receptor blocker is being taken. Eye exam is current.  Hyperlipidemia  This is a chronic problem. The current episode started more than 1 year ago. Exacerbating diseases include diabetes and obesity. Pertinent negatives include no chest pain, myalgias or shortness of breath. Current antihyperlipidemic treatment includes statins. Risk factors for coronary artery disease include a sedentary lifestyle, male sex, diabetes mellitus, dyslipidemia and obesity.     Review of Systems  Constitutional: Negative for fatigue and unexpected weight change.  HENT: Negative for dental problem, mouth sores and trouble swallowing.   Eyes: Negative for visual disturbance.  Respiratory: Negative for cough, choking, chest tightness, shortness of breath and wheezing.   Cardiovascular: Negative for chest pain, palpitations and leg swelling.  Gastrointestinal: Negative for abdominal distention, abdominal pain, constipation,  diarrhea, nausea and vomiting.  Endocrine: Negative for polydipsia, polyphagia and polyuria.  Genitourinary: Positive for impotence. Negative for dysuria, flank pain, hematuria and urgency.  Musculoskeletal: Negative for back pain, gait problem, myalgias and neck pain.  Skin: Negative for pallor, rash and wound.  Neurological: Negative for seizures, syncope, weakness, numbness and headaches.  Psychiatric/Behavioral: Negative.  Negative for confusion and dysphoric mood.    Objective:    BP 122/74   Pulse 83   Wt (!) 438 lb (198.7 kg)   BMI 59.40 kg/m   Wt Readings from Last 3 Encounters:  06/23/17 (!) 438 lb (198.7 kg)  06/14/17 (!) 426 lb 3.2 oz (193.3 kg)  04/08/17 (!) 449 lb 12 oz (204 kg)    Physical Exam  Constitutional: He is oriented to person, place, and time. He appears well-developed and well-nourished. He is cooperative. No distress.  HENT:  Head: Normocephalic and atraumatic.  Eyes: EOM are normal.  Neck: Normal range of motion. Neck supple. No tracheal deviation present. No thyromegaly present.  Cardiovascular: Normal rate, S1 normal, S2 normal and normal heart sounds. Exam reveals no gallop.  No murmur heard. Pulses:      Dorsalis pedis pulses are 1+ on the right side, and 1+ on the left side.       Posterior tibial pulses are 1+ on the right side, and 1+ on the left side.  Pulmonary/Chest: Breath sounds normal. No respiratory distress. He has no wheezes.  Abdominal: Soft. Bowel sounds are normal. He exhibits no distension. There is no tenderness. There is no guarding and no CVA tenderness.  Musculoskeletal: He exhibits no edema.       Right shoulder: He exhibits no swelling and no deformity.  Large lower extremities with skin dryness and lateral eczema on the left lower leg.  Neurological: He is alert and oriented to person, place, and time. He has normal strength and normal reflexes. No cranial nerve deficit or sensory deficit. Gait normal.  Skin: Skin is warm and  dry. No rash noted. No cyanosis. Nails show no clubbing.  He has extensive tattoos.  Psychiatric: He has a normal mood and affect. His speech is normal and behavior is normal. Judgment and thought content normal. Cognition and memory are normal.    Results for orders placed or performed in visit on 04/08/17  Lipid panel  Result Value Ref Range   Cholesterol 164 <200 mg/dL   HDL 50 >40 mg/dL   Triglycerides 181 (H) <150 mg/dL   LDL Cholesterol (Calc) 86 mg/dL (calc)   Total CHOL/HDL Ratio 3.3 <5.0 (calc)   Non-HDL Cholesterol (Calc) 114 <130 mg/dL (calc)  Hepatic function panel  Result Value Ref Range   Total Protein 6.4 6.1 - 8.1 g/dL   Albumin 3.8 3.6 - 5.1 g/dL  Globulin 2.6 1.9 - 3.7 g/dL (calc)   AG Ratio 1.5 1.0 - 2.5 (calc)   Total Bilirubin 0.5 0.2 - 1.2 mg/dL   Bilirubin, Direct 0.1 0.0 - 0.2 mg/dL   Indirect Bilirubin 0.4 0.2 - 1.2 mg/dL (calc)   Alkaline phosphatase (APISO) 79 40 - 115 U/L   AST 22 10 - 35 U/L   ALT 20 9 - 46 U/L  CBC  Result Value Ref Range   WBC 13.3 (H) 3.8 - 10.8 Thousand/uL   RBC 5.02 4.20 - 5.80 Million/uL   Hemoglobin 14.1 13.2 - 17.1 g/dL   HCT 43.0 38.5 - 50.0 %   MCV 85.7 80.0 - 100.0 fL   MCH 28.1 27.0 - 33.0 pg   MCHC 32.8 32.0 - 36.0 g/dL   RDW 15.6 (H) 11.0 - 15.0 %   Platelets 253 140 - 400 Thousand/uL   MPV 11.6 7.5 - 12.5 fL  Diabetic Labs (most recent): Lab Results  Component Value Date   HGBA1C 8.0 (H) 05/26/2017   HGBA1C 8.0 (H) 02/22/2017   HGBA1C 7.0 (H) 11/11/2016   Lipid Panel     Component Value Date/Time   CHOL 164 06/01/2017 0759   TRIG 181 (H) 06/01/2017 0759   HDL 50 06/01/2017 0759   CHOLHDL 3.3 06/01/2017 0759   VLDL 35 (H) 08/11/2016 0748   LDLCALC 72 08/11/2016 0748    Assessment & Plan:   1. Type 2 diabetes mellitus with pressure callus ([facility])  His diabetes is  complicated by PAD, peripheral neuropathy, with hx of diabetic foot ulcer.  Patient came with Improved glycemic profile on U500 insulin  taking 80 units 3 times a day before meals. His recent A1c is unchanged at 8% from last visit. - No documented nor reported hypoglycemia.  - Patient remains at a high risk for more acute and chronic complications of diabetes which include CAD, CVA, CKD, retinopathy, and neuropathy. These are all discussed in detail with the patient.  - I have re-counseled the patient on diet management and weight loss  by adopting a carbohydrate restricted / protein rich  Diet. - Patient is advised to stick to a routine mealtimes to eat 3 meals  a day and avoid unnecessary snacks ( to snack only to correct hypoglycemia).  -  Suggestion is made for him to avoid simple carbohydrates  from his diet including Cakes, Sweet Desserts / Pastries, Ice Cream, Soda (diet and regular), Sweet Tea, Candies, Chips, Cookies, Store Bought Juices, Alcohol in Excess of  1-2 drinks a day, Artificial Sweeteners, and "Sugar-free" Products. This will help patient to have stable blood glucose profile and potentially avoid unintended weight gain.   - I have approached patient with the following individualized plan to manage diabetes and patient agrees.  - I will increase his Humulin U500   to 70 units 3 times a day before meals for pre-meal blood glucose above 90 mg/dL associated with strict monitoring of glucose 4 times a day-before meals and at bedtime. - He would benefit from continuous glucose monitoring, I will consider the Freestyle Libre device during his next visit. - Patient is warned not to take insulin without proper monitoring per orders.  -Patient is encouraged to call clinic for blood glucose levels less than 70 or above 300 mg /dl. - I will continue Victoza 1.8 mg subcutaneous daily, therapeutically suitable for patient. -Patient does not tolerate metformin. - Patient specific target  for A1c; LDL, HDL, Triglycerides, and  Waist Circumference were discussed  in detail.  2) BP/HTN: Controlled to target. I advised him to  continue his current blood pressure medications including ACEI. 3) Lipids/HPL:  continue statins. 4)  Weight/Diet: CDE consult has been initiated, exercise, and carbohydrates information provided. He recently lost 37 pounds mainly due to gastroenteritis.  5) hypothyroidism:   - His thyroid function tests are consistent with appropriate replacement. I advised him to continue levothyroxine 88 g by mouth every morning.  - We discussed about correct intake of levothyroxine, at fasting, with water, separated by at least 30 minutes from breakfast, and separated by more than 4 hours from calcium, iron, multivitamins, acid reflux medications (PPIs). -Patient is made aware of the fact that thyroid hormone replacement is needed for life, dose to be adjusted by periodic monitoring of thyroid function tests.   6) Chronic Care/Health Maintenance:  -Patient is on ACEI and Statin medications and encouraged to continue to follow up with Ophthalmology, Podiatrist at least yearly or according to recommendations, and advised to  stay away from smoking. I have recommended yearly flu vaccine and pneumonia vaccination at least every 5 years; moderate intensity exercise for up to 150 minutes weekly; and  sleep for at least 7 hours a day.  - Time spent with the patient: 25 min, of which >50% was spent in reviewing his sugar logs , discussing his hypo- and hyper-glycemic episodes, reviewing his current and  previous labs and insulin doses and developing a plan to avoid hypo- and hyper-glycemia.   I advised patient to maintain close follow up with his PCP for primary care needs. Follow up plan: Return in about 3 months (around 09/21/2017) for follow up with pre-visit labs, meter, and logs.  Glade Lloyd, MD Phone: [phone]  Fax: [phone]  This note was partially dictated with voice recognition software. Similar sounding words can be transcribed inadequately or may not  be corrected upon review.  06/23/2017, 1:26  PM

## 2017-06-23 NOTE — Patient Instructions (Signed)

## 2017-06-24 ENCOUNTER — Ambulatory Visit (INDEPENDENT_AMBULATORY_CARE_PROVIDER_SITE_OTHER): Payer: PPO | Admitting: Orthotics

## 2017-06-24 DIAGNOSIS — E1149 Type 2 diabetes mellitus with other diabetic neurological complication: Secondary | ICD-10-CM

## 2017-06-24 DIAGNOSIS — L97511 Non-pressure chronic ulcer of other part of right foot limited to breakdown of skin: Secondary | ICD-10-CM | POA: Diagnosis not present

## 2017-06-24 DIAGNOSIS — E11610 Type 2 diabetes mellitus with diabetic neuropathic arthropathy: Secondary | ICD-10-CM

## 2017-06-24 DIAGNOSIS — I739 Peripheral vascular disease, unspecified: Secondary | ICD-10-CM | POA: Diagnosis not present

## 2017-06-24 NOTE — Progress Notes (Signed)

## 2017-06-29 DIAGNOSIS — E1165 Type 2 diabetes mellitus with hyperglycemia: Secondary | ICD-10-CM | POA: Diagnosis not present

## 2017-07-12 ENCOUNTER — Other Ambulatory Visit: Payer: Self-pay | Admitting: Family Medicine

## 2017-07-17 ENCOUNTER — Other Ambulatory Visit: Payer: Self-pay | Admitting: "Endocrinology

## 2017-07-19 ENCOUNTER — Ambulatory Visit (INDEPENDENT_AMBULATORY_CARE_PROVIDER_SITE_OTHER): Payer: Medicare HMO | Admitting: Family Medicine

## 2017-07-19 VITALS — BP 110/70 | Resp 17 | Ht 72.0 in | Wt >= 6400 oz

## 2017-07-19 DIAGNOSIS — G250 Essential tremor: Secondary | ICD-10-CM

## 2017-07-19 DIAGNOSIS — I10 Essential (primary) hypertension: Secondary | ICD-10-CM | POA: Diagnosis not present

## 2017-07-19 DIAGNOSIS — E782 Mixed hyperlipidemia: Secondary | ICD-10-CM | POA: Diagnosis not present

## 2017-07-19 DIAGNOSIS — M549 Dorsalgia, unspecified: Secondary | ICD-10-CM

## 2017-07-19 DIAGNOSIS — E1121 Type 2 diabetes mellitus with diabetic nephropathy: Secondary | ICD-10-CM | POA: Diagnosis not present

## 2017-07-19 NOTE — Patient Instructions (Signed)
F/U in 6 month, call if you n eed me sooner  Handicap sticker  Today  Info today for bariatric surgery  BREAK  HCTZ in [place], blood pressure is LOW  Health dept has dentist available on some basis, please check there   Reduce fat / cheese in diet,   No more falls  Advance directive form completion

## 2017-07-22 DIAGNOSIS — E1165 Type 2 diabetes mellitus with hyperglycemia: Secondary | ICD-10-CM | POA: Diagnosis not present

## 2017-07-22 NOTE — Patient Instructions (Addendum)
Nicholas [name]  07/22/2017     @PREFPERIOPPHARMACY @   Your procedure is scheduled on 07/29/2017.  Report to [facility] at 10:30 A.M.  Call this number if you have problems the morning of surgery:  [phone]   Remember:  Do not eat food or drink liquids after midnight.  Take these medicines the morning of surgery with A SIP OF WATER celexa, flexeril, gabapentin, lamictal, metoprolol, protonix, ultram   HUMULIN R - TAKE USUAL BEFORE MEALS DOSE AND NO NIGHT DOSE, DAY BEFORE AND NONE IN AM OF PROCEDURE   VICTOZA -     TAKE USUAL NIGHT DOSE  Do not wear jewelry, make-up or nail polish.  Do not wear lotions, powders, or perfumes, or deodorant.  Do not shave 48 hours prior to surgery.  Men may shave face and neck.  Do not bring valuables to the hospital.  [facility] is not responsible for any belongings or valuables.  Contacts, dentures or bridgework may not be worn into surgery.  Leave your suitcase in the car.  After surgery it may be brought to your room.  For patients admitted to the hospital, discharge time will be determined by your treatment team.  Patients discharged the day of surgery will not be allowed to drive home.    Please read over the following fact sheets that you were given. Surgical Site Infection Prevention and Anesthesia Post-op Instructions     PATIENT INSTRUCTIONS POST-ANESTHESIA  IMMEDIATELY FOLLOWING SURGERY:  Do not drive or operate machinery for the first twenty four hours after surgery.  Do not make any important decisions for twenty four hours after surgery or while taking narcotic pain medications or sedatives.  If you develop intractable nausea and vomiting or a severe headache please notify your doctor immediately.  FOLLOW-UP:  Please make an appointment with your surgeon as instructed. You do not need to follow up with anesthesia unless specifically instructed to do so.  WOUND CARE INSTRUCTIONS (if applicable):  Keep a dry clean dressing on  the anesthesia/puncture wound site if there is drainage.  Once the wound has quit draining you may leave it open to air.  Generally you should leave the bandage intact for twenty four hours unless there is drainage.  If the epidural site drains for more than 36-48 hours please call the anesthesia department.  QUESTIONS?:  Please feel free to call your physician or the hospital operator if you have any questions, and they will be happy to assist you.      Colonoscopy, Adult A colonoscopy is an exam to look at the entire large intestine. During the exam, a lubricated, bendable tube is inserted into the anus and then passed into the rectum, colon, and other parts of the large intestine. A colonoscopy is often done as a part of normal colorectal screening or in response to certain symptoms, such as anemia, persistent diarrhea, abdominal pain, and blood in the stool. The exam can help screen for and diagnose medical problems, including:  Tumors.  Polyps.  Inflammation.  Areas of bleeding.  Tell a health care provider about:  Any allergies you have.  All medicines you are taking, including vitamins, herbs, eye drops, creams, and over-the-counter medicines.  Any problems you or family members have had with anesthetic medicines.  Any blood disorders you have.  Any surgeries you have had.  Any medical conditions you have.  Any problems you have had passing stool. What are the risks? Generally, this is a safe procedure.  However, problems may occur, including:  Bleeding.  A tear in the intestine.  A reaction to medicines given during the exam.  Infection (rare).  What happens before the procedure? Eating and drinking restrictions Follow instructions from your health care provider about eating and drinking, which may include:  A few days before the procedure - follow a low-fiber diet. Avoid nuts, seeds, dried fruit, raw fruits, and vegetables.  1-3 days before the procedure -  follow a clear liquid diet. Drink only clear liquids, such as clear broth or bouillon, black coffee or tea, clear juice, clear soft drinks or sports drinks, gelatin dessert, and popsicles. Avoid any liquids that contain red or purple dye.  On the day of the procedure - do not eat or drink anything during the 2 hours before the procedure, or within the time period that your health care provider recommends.  Bowel prep If you were prescribed an oral bowel prep to clean out your colon:  Take it as told by your health care provider. Starting the day before your procedure, you will need to drink a large amount of medicated liquid. The liquid will cause you to have multiple loose stools until your stool is almost clear or light green.  If your skin or anus gets irritated from diarrhea, you may use these to relieve the irritation: ? Medicated wipes, such as adult wet wipes with aloe and vitamin E. ? A skin soothing-product like petroleum jelly.  If you vomit while drinking the bowel prep, take a break for up to 60 minutes and then begin the bowel prep again. If vomiting continues and you cannot take the bowel prep without vomiting, call your health care provider.  General instructions  Ask your health care provider about changing or stopping your regular medicines. This is especially important if you are taking diabetes medicines or blood thinners.  Plan to have someone take you home from the hospital or clinic. What happens during the procedure?  An IV tube may be inserted into one of your veins.  You will be given medicine to help you relax (sedative).  To reduce your risk of infection: ? Your health care team will wash or sanitize their hands. ? Your anal area will be washed with soap.  You will be asked to lie on your side with your knees bent.  Your health care provider will lubricate a long, thin, flexible tube. The tube will have a camera and a light on the end.  The tube will be  inserted into your anus.  The tube will be gently eased through your rectum and colon.  Air will be delivered into your colon to keep it open. You may feel some pressure or cramping.  The camera will be used to take images during the procedure.  A small tissue sample may be removed from your body to be examined under a microscope (biopsy). If any potential problems are found, the tissue will be sent to a lab for testing.  If small polyps are found, your health care provider may remove them and have them checked for cancer cells.  The tube that was inserted into your anus will be slowly removed. The procedure may vary among health care providers and hospitals. What happens after the procedure?  Your blood pressure, heart rate, breathing rate, and blood oxygen level will be monitored until the medicines you were given have worn off.  Do not drive for 24 hours after the exam.  You may have a  small amount of blood in your stool.  You may pass gas and have mild abdominal cramping or bloating due to the air that was used to inflate your colon during the exam.  It is up to you to get the results of your procedure. Ask your health care provider, or the department performing the procedure, when your results will be ready. This information is not intended to replace advice given to you by your health care provider. Make sure you discuss any questions you have with your health care provider. Document Released: 06/12/2000 Document Revised: 04/15/2016 Document Reviewed: 08/27/2015 Elsevier Interactive Patient Education  2018 [employer].

## 2017-07-23 ENCOUNTER — Other Ambulatory Visit: Payer: Self-pay

## 2017-07-23 ENCOUNTER — Encounter (HOSPITAL_COMMUNITY): Payer: Self-pay

## 2017-07-23 ENCOUNTER — Encounter (HOSPITAL_COMMUNITY)
Admission: RE | Admit: 2017-07-23 | Discharge: 2017-07-23 | Disposition: A | Discharge status: Home / Self Care | Payer: Medicare HMO | Source: Ambulatory Visit | Attending: Internal Medicine | Admitting: Internal Medicine

## 2017-07-23 DIAGNOSIS — Z01818 Encounter for other preprocedural examination: Secondary | ICD-10-CM | POA: Insufficient documentation

## 2017-07-23 DIAGNOSIS — Z01810 Encounter for preprocedural cardiovascular examination: Secondary | ICD-10-CM | POA: Diagnosis not present

## 2017-07-23 DIAGNOSIS — Z86010 Personal history of colonic polyps: Secondary | ICD-10-CM | POA: Diagnosis not present

## 2017-07-23 LAB — CBC
HEMATOCRIT: 44.8 % (ref 39.0–52.0)
Hemoglobin: 13.7 g/dL (ref 13.0–17.0)
MCH: 27.3 pg (ref 26.0–34.0)
MCHC: 30.6 g/dL (ref 30.0–36.0)
MCV: 89.2 fL (ref 78.0–100.0)
PLATELETS: 229 10*3/uL (ref 150–400)
RBC: 5.02 MIL/uL (ref 4.22–5.81)
RDW: 15.8 % — AB (ref 11.5–15.5)
WBC: 11.1 10*3/uL — AB (ref 4.0–10.5)

## 2017-07-23 LAB — BASIC METABOLIC PANEL
Anion gap: 11 (ref 5–15)
BUN: 21 mg/dL — ABNORMAL HIGH (ref 6–20)
CHLORIDE: 102 mmol/L (ref 101–111)
CO2: 27 mmol/L (ref 22–32)
CREATININE: 1.13 mg/dL (ref 0.61–1.24)
Calcium: 9.5 mg/dL (ref 8.9–10.3)
GFR calc non Af Amer: >60 mL/min (ref >60)
Glucose, Bld: 184 mg/dL — ABNORMAL HIGH (ref 65–99)
POTASSIUM: 4.5 mmol/L (ref 3.5–5.1)
SODIUM: 140 mmol/L (ref 135–145)

## 2017-07-23 NOTE — Progress Notes (Signed)
   07/23/17 1021  OBSTRUCTIVE SLEEP APNEA  Have you ever been diagnosed with sleep apnea through a sleep study? No  Do you snore loudly (loud enough to be heard through closed doors)?  0  Do you often feel tired, fatigued, or sleepy during the daytime (such as falling asleep during driving or talking to someone)? 1  Has anyone observed you stop breathing during your sleep? 0  Do you have, or are you being treated for high blood pressure? 1  BMI more than 35 kg/m2? 1  Age > 50 (1-yes) 1  Neck circumference greater than:Male 16 inches or larger, Male 17inches or larger? 1  Male Gender (Yes=1) 1  Obstructive Sleep Apnea Score 6  Score 5 or greater  Results sent to PCP

## 2017-07-27 ENCOUNTER — Encounter: Payer: Self-pay | Admitting: Family Medicine

## 2017-07-27 NOTE — Assessment & Plan Note (Signed)
Hyperlipidemia:Low fat diet discussed and encouraged.   Lipid Panel  Lab Results  Component Value Date   CHOL 164 06/01/2017   HDL 50 06/01/2017   LDLCALC 72 08/11/2016   TRIG 181 (H) 06/01/2017   CHOLHDL 3.3 06/01/2017   Updated lab needed at/ before next visit.

## 2017-07-27 NOTE — Assessment & Plan Note (Signed)
Nicholas [name] is reminded of the importance of commitment to daily physical activity for 30 minutes or more, as able and the need to limit carbohydrate intake to 30 to 60 grams per meal to help with blood sugar control.   The need to take medication as prescribed, test blood sugar as directed, and to call between visits if there is a concern that blood sugar is uncontrolled is also discussed.   Nicholas [name] is reminded of the importance of daily foot exam, annual eye examination, and good blood sugar, blood pressure and cholesterol control. Uncontrolled   Diabetic Labs Latest Ref Rng & Units 07/23/2017 06/01/2017 05/26/2017 02/22/2017 11/11/2016  HbA1c <5.7 % of total Hgb - - 8.0(H) 8.0(H) 7.0(H)  Microalbumin <2.0 mg/dL - - - - -  Micro/Creat Ratio 0.0 - 30.0 mg/g - - - - -  Chol <200 mg/dL - 164 - - -  HDL >40 mg/dL - 50 - - -  Calc LDL <100 mg/dL - - - - -  Triglycerides <150 mg/dL - 181(H) - - -  Creatinine 0.61 - 1.24 mg/dL 1.13 - 1.31(H) 1.25 1.18   BP/Weight 07/23/2017 07/19/2017 06/23/2017 06/14/2017 04/08/2017 03/02/2017 12/27/1599  Systolic BP 093 235 573 220 254 270 623  Diastolic BP 67 70 74 64 70 70 79  Wt. (Lbs) 461 468 438 426.2 449.75 438 431  BMI 62.52 63.47 59.4 57.8 61 61.09 60.11   Foot/eye exam completion dates Latest Ref Rng & Units 05/29/2016 02/14/2016  Eye Exam No Retinopathy No Retinopathy -  Foot exam Order - - -  Foot Form Completion - - Done

## 2017-07-27 NOTE — Assessment & Plan Note (Signed)
Deteriorated. Patient re-educated about  the importance of commitment to a  minimum of 150 minutes of exercise per week.  The importance of healthy food choices with portion control discussed. Encouraged to start a food diary, count calories and to consider  joining a support group. Sample diet sheets offered. Goals set by the patient for the next several months.   Weight /BMI 07/23/2017 07/19/2017 06/23/2017  WEIGHT 461 lb 468 lb 438 lb  HEIGHT 6\' 0"  6\' 0"  -  BMI 62.52 kg/m2 63.47 kg/m2 59.4 kg/m2  Info given on gastric bypass an dpt encouraged  To follow through

## 2017-07-27 NOTE — Assessment & Plan Note (Addendum)
Symptomatic and over corrected, needs to break HCTZ in half DASH diet and commitment to daily physical activity for a minimum of 30 minutes discussed and encouraged, as a part of hypertension management. The importance of attaining a healthy weight is also discussed.  BP/Weight 07/23/2017 07/19/2017 06/23/2017 06/14/2017 04/08/2017 03/02/2017 1/74/7159  Systolic BP 539 672 897 915 041 364 383  Diastolic BP 67 70 74 64 70 70 79  Wt. (Lbs) 461 468 438 426.2 449.75 438 431  BMI 62.52 63.47 59.4 57.8 61 61.09 60.11

## 2017-07-27 NOTE — Assessment & Plan Note (Signed)
Increased symptoms however no interest in further eval at this time

## 2017-07-27 NOTE — Assessment & Plan Note (Signed)
Increased due to weight gain, needs to work on weight loss, continue tramadol as before and gabapentin

## 2017-07-27 NOTE — Progress Notes (Signed)
Nicholas [name]     MRN: [id number]      DOB: September 03, 1955   HPI Nicholas [name] is here for follow up and re-evaluation of chronic medical conditions, medication management and review of any available recent lab and radiology data.  Preventive health is updated, specifically  Cancer screening and Immunization.   Followed closely by Podiatry and urology.Also sees endocrinology, unfortunately his blood sugar is un controled   The PT denies any adverse reactions to current medications since the last visit.  C/o light headedness when he stands and also increased tremor in the hands when attempting to write. No interest in seeing either cardiology or neurology about either complaint. Blood sugar uncontrolled and he does have peripheral neuropathy and also likely now some autonomic neuropathy  Willing to consider gastric bypass surgery once more , has tried twice with no success C/o extensive dental caries needs dental care  But finds this expensive ROS Denies recent fever or chills. Denies sinus pressure, nasal congestion, ear pain or sore throat. Denies chest congestion, productive cough or wheezing. Denies chest pains, palpitations and leg swelling Denies abdominal pain, nausea, vomiting,diarrhea or constipation.   Denies dysuria, frequency, hesitancy or incontinence. . Denies depression, anxiety or insomnia. Denies skin break down or rash.   PE  BP 110/70   Resp 17   Ht 6' (1.829 m)   Wt (!) 468 lb (212.3 kg)   SpO2 94%   BMI 63.47 kg/m   Patient alert and oriented and in no cardiopulmonary distress.  HEENT: No facial asymmetry, EOMI,   oropharynx pink and moist.  Neck supple no JVD, no mass.Extensive dental caries  Chest: Clear to auscultation bilaterally.  CVS: S1, S2 no murmurs, no S3.Regular rate.  ABD: Soft non tender.   Ext: No edema  MS: Decreased  ROM spine, shoulders, hips and knees.  Skin: Intact, no ulcerations or rash noted.  Psych: Good eye contact,  normal affect. Memory intact not anxious or depressed appearing.  CNS: CN 2-12 intact, power,  normal throughout.no focal deficits noted.Tremor at rest   Assessment & Plan  Benign hypertension Symptomatic and over corrected, needs to break HCTZ in half DASH diet and commitment to daily physical activity for a minimum of 30 minutes discussed and encouraged, as a part of hypertension management. The importance of attaining a healthy weight is also discussed.  BP/Weight 07/23/2017 07/19/2017 06/23/2017 06/14/2017 04/08/2017 03/02/2017 7/34/2876  Systolic BP 811 572 620 355 974 163 845  Diastolic BP 67 70 74 64 70 70 79  Wt. (Lbs) 461 468 438 426.2 449.75 438 431  BMI 62.52 63.47 59.4 57.8 61 61.09 60.11       MORBID OBESITY Deteriorated. Patient re-educated about  the importance of commitment to a  minimum of 150 minutes of exercise per week.  The importance of healthy food choices with portion control discussed. Encouraged to start a food diary, count calories and to consider  joining a support group. Sample diet sheets offered. Goals set by the patient for the next several months.   Weight /BMI 07/23/2017 07/19/2017 06/23/2017  WEIGHT 461 lb 468 lb 438 lb  HEIGHT 6\' 0"  6\' 0"  -  BMI 62.52 kg/m2 63.47 kg/m2 59.4 kg/m2  Info given on gastric bypass an dpt encouraged  To follow through    Type 2 diabetes mellitus with diabetic nephropathy, without long-term current use of insulin [facility]) Nicholas [name] is reminded of the importance of commitment to daily physical activity for 30 minutes or  more, as able and the need to limit carbohydrate intake to 30 to 60 grams per meal to help with blood sugar control.   The need to take medication as prescribed, test blood sugar as directed, and to call between visits if there is a concern that blood sugar is uncontrolled is also discussed.   Nicholas [name] is reminded of the importance of daily foot exam, annual eye examination, and good blood sugar,  blood pressure and cholesterol control. Uncontrolled   Diabetic Labs Latest Ref Rng & Units 07/23/2017 06/01/2017 05/26/2017 02/22/2017 11/11/2016  HbA1c <5.7 % of total Hgb - - 8.0(H) 8.0(H) 7.0(H)  Microalbumin <2.0 mg/dL - - - - -  Micro/Creat Ratio 0.0 - 30.0 mg/g - - - - -  Chol <200 mg/dL - 164 - - -  HDL >40 mg/dL - 50 - - -  Calc LDL <100 mg/dL - - - - -  Triglycerides <150 mg/dL - 181(H) - - -  Creatinine 0.61 - 1.24 mg/dL 1.13 - 1.31(H) 1.25 1.18   BP/Weight 07/23/2017 07/19/2017 06/23/2017 06/14/2017 04/08/2017 03/02/2017 02/05/9832  Systolic BP 825 053 976 734 193 790 240  Diastolic BP 67 70 74 64 70 70 79  Wt. (Lbs) 461 468 438 426.2 449.75 438 431  BMI 62.52 63.47 59.4 57.8 61 61.09 60.11   Foot/eye exam completion dates Latest Ref Rng & Units 05/29/2016 02/14/2016  Eye Exam No Retinopathy No Retinopathy -  Foot exam Order - - -  Foot Form Completion - - Done        Hyperlipidemia Hyperlipidemia:Low fat diet discussed and encouraged.   Lipid Panel  Lab Results  Component Value Date   CHOL 164 06/01/2017   HDL 50 06/01/2017   LDLCALC 72 08/11/2016   TRIG 181 (H) 06/01/2017   CHOLHDL 3.3 06/01/2017   Updated lab needed at/ before next visit.     Benign essential tremor Increased symptoms however no interest in further eval at this time  Back pain with radiation Increased due to weight gain, needs to work on weight loss, continue tramadol as before and gabapentin

## 2017-07-29 ENCOUNTER — Other Ambulatory Visit: Payer: Self-pay

## 2017-07-29 ENCOUNTER — Ambulatory Visit (HOSPITAL_COMMUNITY): Payer: Medicare HMO | Admitting: Anesthesiology

## 2017-07-29 ENCOUNTER — Encounter (HOSPITAL_COMMUNITY)
Admission: RE | Disposition: A | Discharge status: Home / Self Care | Payer: Self-pay | Source: Ambulatory Visit | Attending: Internal Medicine

## 2017-07-29 ENCOUNTER — Ambulatory Visit (HOSPITAL_COMMUNITY)
Admission: RE | Admit: 2017-07-29 | Discharge: 2017-07-29 | Disposition: A | Discharge status: Home / Self Care | Payer: Medicare HMO | Source: Ambulatory Visit | Attending: Internal Medicine | Admitting: Internal Medicine

## 2017-07-29 ENCOUNTER — Encounter (HOSPITAL_COMMUNITY): Payer: Self-pay | Admitting: *Deleted

## 2017-07-29 DIAGNOSIS — D122 Benign neoplasm of ascending colon: Secondary | ICD-10-CM | POA: Diagnosis not present

## 2017-07-29 DIAGNOSIS — Z1211 Encounter for screening for malignant neoplasm of colon: Secondary | ICD-10-CM | POA: Insufficient documentation

## 2017-07-29 DIAGNOSIS — E291 Testicular hypofunction: Secondary | ICD-10-CM | POA: Diagnosis not present

## 2017-07-29 DIAGNOSIS — Z79891 Long term (current) use of opiate analgesic: Secondary | ICD-10-CM | POA: Insufficient documentation

## 2017-07-29 DIAGNOSIS — Z7982 Long term (current) use of aspirin: Secondary | ICD-10-CM | POA: Insufficient documentation

## 2017-07-29 DIAGNOSIS — E039 Hypothyroidism, unspecified: Secondary | ICD-10-CM | POA: Insufficient documentation

## 2017-07-29 DIAGNOSIS — R69 Illness, unspecified: Secondary | ICD-10-CM | POA: Diagnosis not present

## 2017-07-29 DIAGNOSIS — I509 Heart failure, unspecified: Secondary | ICD-10-CM | POA: Insufficient documentation

## 2017-07-29 DIAGNOSIS — Z87891 Personal history of nicotine dependence: Secondary | ICD-10-CM | POA: Diagnosis not present

## 2017-07-29 DIAGNOSIS — Z8614 Personal history of Methicillin resistant Staphylococcus aureus infection: Secondary | ICD-10-CM | POA: Insufficient documentation

## 2017-07-29 DIAGNOSIS — I739 Peripheral vascular disease, unspecified: Secondary | ICD-10-CM | POA: Insufficient documentation

## 2017-07-29 DIAGNOSIS — D125 Benign neoplasm of sigmoid colon: Secondary | ICD-10-CM | POA: Diagnosis not present

## 2017-07-29 DIAGNOSIS — K635 Polyp of colon: Secondary | ICD-10-CM | POA: Diagnosis not present

## 2017-07-29 DIAGNOSIS — F329 Major depressive disorder, single episode, unspecified: Secondary | ICD-10-CM | POA: Insufficient documentation

## 2017-07-29 DIAGNOSIS — Z794 Long term (current) use of insulin: Secondary | ICD-10-CM | POA: Diagnosis not present

## 2017-07-29 DIAGNOSIS — Z79899 Other long term (current) drug therapy: Secondary | ICD-10-CM | POA: Diagnosis not present

## 2017-07-29 DIAGNOSIS — Z86010 Personal history of colonic polyps: Secondary | ICD-10-CM | POA: Diagnosis not present

## 2017-07-29 DIAGNOSIS — I110 Hypertensive heart disease with heart failure: Secondary | ICD-10-CM | POA: Diagnosis not present

## 2017-07-29 DIAGNOSIS — E1140 Type 2 diabetes mellitus with diabetic neuropathy, unspecified: Secondary | ICD-10-CM | POA: Diagnosis not present

## 2017-07-29 HISTORY — PX: POLYPECTOMY: SHX5525

## 2017-07-29 HISTORY — PX: COLONOSCOPY WITH PROPOFOL: SHX5780

## 2017-07-29 LAB — GLUCOSE, CAPILLARY
Glucose-Capillary: 155 mg/dL — ABNORMAL HIGH (ref 65–99)
Glucose-Capillary: 169 mg/dL — ABNORMAL HIGH (ref 65–99)

## 2017-07-29 SURGERY — COLONOSCOPY WITH PROPOFOL
Anesthesia: Monitor Anesthesia Care

## 2017-07-29 MED ORDER — FENTANYL CITRATE (PF) 100 MCG/2ML IJ SOLN
25.0000 ug | Freq: Once | INTRAMUSCULAR | Status: AC
  Administered 2017-07-29: 25 ug via INTRAVENOUS

## 2017-07-29 MED ORDER — FENTANYL CITRATE (PF) 100 MCG/2ML IJ SOLN
INTRAMUSCULAR | Status: AC
  Filled 2017-07-29: qty 2

## 2017-07-29 MED ORDER — LACTATED RINGERS IV SOLN
INTRAVENOUS | Status: DC
  Administered 2017-07-29: 11:00:00 via INTRAVENOUS

## 2017-07-29 MED ORDER — PROPOFOL 10 MG/ML IV BOLUS
INTRAVENOUS | Status: AC
  Filled 2017-07-29: qty 40

## 2017-07-29 MED ORDER — MIDAZOLAM HCL 2 MG/2ML IJ SOLN
INTRAMUSCULAR | Status: AC
  Filled 2017-07-29: qty 2

## 2017-07-29 MED ORDER — PROPOFOL 500 MG/50ML IV EMUL
INTRAVENOUS | Status: DC | PRN
  Administered 2017-07-29 (×3): via INTRAVENOUS
  Administered 2017-07-29: 125 ug/kg/min via INTRAVENOUS

## 2017-07-29 MED ORDER — MIDAZOLAM HCL 2 MG/2ML IJ SOLN
1.0000 mg | INTRAMUSCULAR | Status: AC
  Administered 2017-07-29: 2 mg via INTRAVENOUS

## 2017-07-29 MED ORDER — MIDAZOLAM HCL 5 MG/5ML IJ SOLN
INTRAMUSCULAR | Status: DC | PRN
  Administered 2017-07-29: 1 mg via INTRAVENOUS

## 2017-07-29 NOTE — H&P (Signed)
$'@LOGO'P$ @   Primary Care Physician:  Fayrene Helper, MD Primary Gastroenterologist:  Dr. Gala Romney  Pre-Procedure History & Physical: HPI:  Nicholas [name] is a 62 y.o. male here for surveillance colonoscopy. Multiple colonic adenomas removed  No bowel symptom Currently. Past Medical History:  Diagnosis Date  . Chronic knee pain   . Depression   . Diabetes ([place])   . Glaucoma   . Hyperlipidemia   . Hypertension   . MRSA (methicillin resistant Staphylococcus aureus)    patient denies  . Neuropathy   . Poor circulation    leg   . Suicide attempt ([place])   . Testosterone deficiency     Past Surgical History:  Procedure Laterality Date  . CHOLECYSTECTOMY N/A 02/25/2015   Procedure: LAPAROSCOPIC CHOLECYSTECTOMY;  Surgeon: Aviva Signs, MD;  Location: [facility];  Service: General;  Laterality: N/A;  . COLONOSCOPY  03/2008   SWF:UXNATF external hemorrhoidal tag, otherwise normal rectum/Two diminutive rectosigmoid polyps s/p bx/ Polyp in the opposite the ileocecal valve s/p bx. tubular adenoma.  . COLONOSCOPY N/A 03/23/2014   TDD:UKGURKYH colon polyps throughout his rectum and colon. Sessile and pedunculated. The largest polyp was proximally 6 mm in dimensions in the retum at 3 cm with adjacent diminutive polyp.  There was also a polyps on ileocecal valve, ascending segmen descending and sigmoid segment. as the colon was tortuous & elongated requiring externa abd pressure and changing of the pts postion to reach cecum  . ESOPHAGOGASTRODUODENOSCOPY N/A 03/23/2014   RMR: 1. Patient had circumferential distal esophageal erosions within 5 mm of the GE junction.  No Barrett's esophagus. Tubular esophagus patent throughout its course. 2.  Diffuse gastric erosions. (1) 79m area of healing ulceration in the antrum. No ulcer or infiltrating process observed. Patent pylorus. Normal-appearing first and second portion of the duodenum .  . kidney stones     cysto with litholapexy    Prior to Admission  medications   Medication Sig Start Date End Date Taking? Authorizing Provider  ANDROGEL PUMP 20.25 MG/ACT (1.62%) GEL Apply 2 application topically daily at 2 PM. Apply 1 pump to each shoulder daily in the afternoon 05/15/16  Yes [provider]  aspirin EC 81 MG tablet Take 81 mg by mouth every evening.    Yes [provider]  brimonidine-timolol (COMBIGAN) 0.2-0.5 % ophthalmic solution Place 1 drop into both eyes 2 (two) times daily.    Yes [provider]  citalopram (CELEXA) 40 MG tablet TAKE 1 TABLET BY MOUTH ONCE A DAY. Patient taking differently: TAKE 1 TABLET BY MOUTH ONCE A DAY AT BEDTIME. 05/24/17  Yes SFayrene Helper MD  cyclobenzaprine (FLEXERIL) 10 MG tablet TAKE (1) TABLET BY MOUTH EVERY EIGHT HOURS AS NEEDED FOR MUSCLE SPASMS. 05/27/17  Yes SFayrene Helper MD  gabapentin (NEURONTIN) 600 MG tablet TAKE ONE TABLET BY MOUTH 3 TIMES DAILY. 11/30/16  Yes SFayrene Helper MD  HUMULIN R U-500 KWIKPEN 500 UNIT/ML kwikpen INJECT UP TO 100 UNITS SUBCUTANEOUSLY 30 MINUTES BEFORE MEALS. Patient taking differently: INJECT UP TO 100 UNITS SUBCUTANEOUSLY 30 MINUTES BEFORE MEALS. (TYPICALLY 70 UNITS THREE TIMES DAILY) 06/24/17  Yes Nida, GMarella Chimes MD  hydrochlorothiazide (HYDRODIURIL) 25 MG tablet Take 1 tablet (25 mg total) by mouth daily. Patient taking differently: Take 12.5 mg by mouth daily.  06/01/17  Yes SFayrene Helper MD  ketoconazole (NIZORAL) 2 % shampoo APPLY TOPICALLY TWICE WEEKLY AS DIRECTED. Patient taking differently: APPLY TOPICALLY TWICE WEEKLY AS NEEDED FOR DRY/ITCHY/FLAKY SCALP. 02/15/17  Yes Fayrene Helper, MD  lamoTRIgine (LAMICTAL) 200 MG tablet TAKE 1 TABLET BY MOUTH ONCE A DAY. 05/18/17  Yes Fayrene Helper, MD  levothyroxine (SYNTHROID, LEVOTHROID) 88 MCG tablet TAKE ONE TABLET BY MOUTH DAILY BEFORE BREAKFAST. 06/03/17  Yes Nida, Marella Chimes, MD  lisinopril (PRINIVIL,ZESTRIL) 5 MG tablet TAKE 1 TABLET BY MOUTH ONCE  A DAY. Patient taking differently: TAKE 1 TABLET BY MOUTH ONCE A DAY AT NIGHT 06/17/17  Yes Fayrene Helper, MD  metoprolol succinate (TOPROL-XL) 25 MG 24 hr tablet TAKE (1) TABLET BY MOUTH ONCE DAILY. 03/18/17  Yes Fayrene Helper, MD  Na Sulfate-K Sulfate-Mg Sulf 17.5-3.13-1.6 GM/177ML SOLN Take 1 kit by mouth as directed. 06/14/17  Yes Payne Garske, Cristopher Estimable, MD  naproxen (NAPROSYN) 500 MG tablet TAKE ONE TABLET BY MOUTH TWICE DAILY WITH A MEAL. Patient taking differently: TAKE ONE TABLET BY MOUTH TWICE DAILY AS NEEDED FOR PAIN. 08/06/16  Yes Fayrene Helper, MD  NOVOFINE 30G X 8 MM MISC  05/20/16  Yes [provider]  pantoprazole (PROTONIX) 40 MG tablet TAKE (1) TABLET BY MOUTH TWICE DAILY. 04/05/17  Yes Annitta Needs, NP  simvastatin (ZOCOR) 40 MG tablet TAKE 1 TABLET BY MOUTH ONCE A DAY. Patient taking differently: TAKE 1 TABLET BY MOUTH ONCE A DAY IN THE AFTERNOON. 01/11/17  Yes Fayrene Helper, MD  SURE COMFORT PEN NEEDLES 31G X 8 MM MISC USE TWICE DAILY WITH BYETTA 02/15/17  Yes Nida, Marella Chimes, MD  traMADol (ULTRAM) 50 MG tablet TAKE 2 TABLETS BY MOUTH EVERY 8 HOURS AS NEEDED. Patient taking differently: Take 50-100 mg by mouth 3 (three) times daily as needed (FOR PAIN.). TAKE 2 TABLETS BY MOUTH EVERY 8 HOURS AS NEEDED. 04/05/17  Yes Fayrene Helper, MD  TRAVATAN Z 0.004 % SOLN ophthalmic solution Place 1 drop into both eyes at bedtime.  08/30/15  Yes [provider]  triamcinolone ointment (KENALOG) 0.1 % APPLY TO AFFECTED AREAS TWICE DAILY. Patient taking differently: APPLY TO AFFECTED AREAS TWICE DAILY AS NEEDED FOR DRY/ITCHY SKIN. 04/06/17  Yes Nida, Marella Chimes, MD  VICTOZA 18 MG/3ML SOPN INJECT 1.8 MG SUBCUTANEOUSLY ONCE DAILY. Patient taking differently: INJECT 1.8 MG SUBCUTANEOUSLY ONCE DAILY AT NIGHT. 06/14/17  Yes Nida, Marella Chimes, MD  Vitamin D, Ergocalciferol, (DRISDOL) 50000 units CAPS capsule TAKE ONE CAPSULE BY MOUTH ONCE  WEEKLY. Patient taking differently: TAKE ONE CAPSULE BY MOUTH ONCE WEEKLY ON SUNDAYS. 07/19/17  Yes Cassandria Anger, MD    Allergies as of 06/14/2017 - Review Complete 06/14/2017  Allergen Reaction Noted  . Dilaudid [hydromorphone hcl] Shortness Of Breath 06/11/2016    Family History  Problem Relation Age of Onset  . Diabetes Mother   . Leukemia Mother   . Cancer Father        colon, age greater than 45?  Marland Kitchen Heart disease Father   . Glaucoma Father   . Parkinsonism Father        brain hemorrhage  . Alzheimer's disease Father   . Glaucoma Sister   . Dementia Sister   . Colon cancer Neg Hx     Social History   Socioeconomic History  . Marital status: Divorced    Spouse name: Not on file  . Number of children: 0  . Years of education: 11  . Highest education level: Not on file  Social Needs  . Financial resource strain: Not on file  . Food insecurity - worry: Not on file  . Food insecurity -  inability: Not on file  . Transportation needs - medical: Not on file  . Transportation needs - non-medical: Not on file  Occupational History  . Occupation: DISABLED    Employer: UNEMPLOYED  Tobacco Use  . Smoking status: Former Smoker    Packs/day: 1.50    Years: 25.00    Pack years: 37.50    Types: Cigarettes    Last attempt to quit: 02/20/2002    Years since quitting: 15.4  . Smokeless tobacco: Former Systems developer    Types: Snuff    Quit date: 11/02/1980  . Tobacco comment: Quit x 15 years  Substance and Sexual Activity  . Alcohol use: No  . Drug use: No  . Sexual activity: Not Currently    Birth control/protection: None  Other Topics Concern  . Not on file  Social History Narrative  . Not on file    Review of Systems: See HPI, otherwise negative ROS  Physical Exam: BP (!) 119/58   Pulse 81   Temp (!) 97.5 F (36.4 C) (Oral)   Resp (!) 21   SpO2 97%  General:   Alert,  Obese. pleasant and cooperative in NAD Lungs:  Clear throughout to auscultation.   No  wheezes, crackles, or rhonchi. No acute distress. Heart:  Regular rate and rhythm; no murmurs, clicks, rubs,  or gallops. Abdomen: obese.normal bowel sounds.  Soft and nontender without appreciable mass or hepatosplenomegaly.  Pulses:  Normal pulses noted. Extremities:  Without clubbing or edema.  Impression: Super morbidly obese gentleman with a history of colonic polyps-here for surveillance colonoscopy.  Recommendations:  I have offered the patient a  Surveillance colonoscopy today.  The risks, benefits, limitations, alternatives and imponderables have been reviewed with the patient. Questions have been answered. All parties are agreeable.      Notice: This dictation was prepared with Dragon dictation along with smaller phrase technology. Any transcriptional errors that result from this process are unintentional and may not be corrected upon review.

## 2017-07-29 NOTE — Anesthesia Preprocedure Evaluation (Signed)
Anesthesia Evaluation  Patient identified by MRN, date of birth, ID band Patient awake    Reviewed: Allergy & Precautions, NPO status , Patient's Chart, lab work & pertinent test results  Airway Mallampati: II  TM Distance: >3 FB Neck ROM: Full    Dental  (+) Edentulous Upper, Poor Dentition, Dental Advisory Given   Pulmonary shortness of breath, with exertion and lying, former smoker,    breath sounds clear to auscultation       Cardiovascular hypertension, Pt. on medications + Peripheral Vascular Disease, +CHF, + Orthopnea and + DOE   Rhythm:Regular Rate:Normal     Neuro/Psych PSYCHIATRIC DISORDERS Depression  Neuromuscular disease    GI/Hepatic neg GERD  ,  Endo/Other  diabetes, Type 2, Insulin DependentHypothyroidism Morbid obesity  Renal/GU Renal disease     Musculoskeletal   Abdominal (+) + obese,   Peds  Hematology   Anesthesia Other Findings   Reproductive/Obstetrics                             Anesthesia Physical Anesthesia Plan  ASA: III  Anesthesia Plan: MAC   Post-op Pain Management:    Induction: Intravenous  PONV Risk Score and Plan:   Airway Management Planned: Simple Face Mask  Additional Equipment:   Intra-op Plan:   Post-operative Plan:   Informed Consent: I have reviewed the patients History and Physical, chart, labs and discussed the procedure including the risks, benefits and alternatives for the proposed anesthesia with the patient or authorized representative who has indicated his/her understanding and acceptance.     Plan Discussed with:   Anesthesia Plan Comments:         Anesthesia Quick Evaluation

## 2017-07-29 NOTE — Op Note (Signed)
[facility] Patient Name: Nicholas [name] Procedure Date: 07/29/2017 11:03 AM MRN: [id number] Date of Birth: May 05, 1956 Attending MD: Norvel Richards , MD CSN: [id number] Age: 62 Admit Type: Outpatient Procedure:                Colonoscopy Indications:              High risk colon cancer surveillance: Personal                            history of colonic polyps Providers:                Norvel Richards, MD, Otis Peak B. Sharon Seller, RN,                            Aram Candela Referring MD:              Medicines:                Propofol per Anesthesia Complications:            No immediate complications. Estimated Blood Loss:     Estimated blood loss was minimal. Procedure:                Pre-Anesthesia Assessment:                           - Prior to the procedure, a History and Physical                            was performed, and patient medications and                            allergies were reviewed. The patient's tolerance of                            previous anesthesia was also reviewed. The risks                            and benefits of the procedure and the sedation                            options and risks were discussed with the patient.                            All questions were answered, and informed consent                            was obtained. Prior Anticoagulants: The patient has                            taken no previous anticoagulant or antiplatelet                            agents. ASA Grade Assessment: III - A patient with  severe systemic disease. After reviewing the risks                            and benefits, the patient was deemed in                            satisfactory condition to undergo the procedure.                           After obtaining informed consent, the colonoscope                            was passed under direct vision. Throughout the                            procedure, the  patient's blood pressure, pulse, and                            oxygen saturations were monitored continuously. The                            EC-[id number] ([id number]) scope was introduced through                            the and advanced to the the cecum, identified by                            appendiceal orifice and ileocecal valve. The                            ileocecal valve, appendiceal orifice, and rectum                            were photographed. The ileocecal valve, appendiceal                            orifice, and rectum were photographed. The entire                            colon was well visualized. The colonoscopy was                            somewhat difficult due to the patient's body                            habitus. Successful completion of the procedure was                            aided by applying abdominal pressure. The quality                            of the bowel preparation was adequate. Scope In: 11:34:49 AM Scope Out: 45:40:98 PM Scope Withdrawal Time: 0 hours 14 minutes 12 seconds  Total Procedure Duration:  0 hours 31 minutes 16 seconds  Findings:      The perianal and digital rectal examinations were normal.      Five semi-pedunculated polyps were found in the sigmoid colon and       ascending colon. The polyps were 5 to 7 mm in size. These polyps were       removed with a cold snare. Resection and retrieval were complete.       Estimated blood loss was minimal.      The exam was otherwise without abnormality on direct and retroflexion       views. lipoma ascending segment. Redundant and elongated colon. Changing       position and external abdominal pressure required to reach the cecum. Impression:               - Five 5 to 7 mm polyps in the sigmoid colon and in                            the ascending colon, removed with a cold snare.                            Resected and retrieved. - The examination was                            otherwise  normal on direct and retroflexion views.                            Redundant colon Moderate Sedation:      Moderate (conscious) sedation was personally administered by an       anesthesia professional. The following parameters were monitored: oxygen       saturation, heart rate, blood pressure, respiratory rate, EKG, adequacy       of pulmonary ventilation, and response to care. Total physician       intraservice time was 39 minutes. Recommendation:           - Patient has a contact number available for                            emergencies. The signs and symptoms of potential                            delayed complications were discussed with the                            patient. Return to normal activities tomorrow.                            Written discharge instructions were provided to the                            patient.                           - Resume previous diet.                           - Continue present  medications.                           - Await pathology results.                           - Repeat colonoscopy date to be determined after                            pending pathology results are reviewed for                            surveillance based on pathology results.                           - Return to GI clinic (date not yet determined). Procedure Code(s):        --- Professional ---                           [phone], Colonoscopy, flexible; with removal of                            tumor(s), polyp(s), or other lesion(s) by snare                            technique Diagnosis Code(s):        --- Professional ---                           Z86.010, Personal history of colonic polyps                           D12.5, Benign neoplasm of sigmoid colon                           D12.2, Benign neoplasm of ascending colon CPT copyright 2016 [employer]. All rights reserved. The codes documented in this report are preliminary and upon coder  review may  be revised to meet current compliance requirements. Cristopher Estimable. Rourk, MD Norvel Richards, MD 07/29/2017 12:14:20 PM This report has been signed electronically. Number of Addenda: 0

## 2017-07-29 NOTE — Discharge Instructions (Signed)
Colon Polyps Polyps are tissue growths inside the body. Polyps can grow in many places, including the large intestine (colon). A polyp may be a round bump or a mushroom-shaped growth. You could have one polyp or several. Most colon polyps are noncancerous (benign). However, some colon polyps can become cancerous over time. What are the causes? The exact cause of colon polyps is not known. What increases the risk? This condition is more likely to develop in people who:  Have a family history of colon cancer or colon polyps.  Are older than 34 or older than 45 if they are African American.  Have inflammatory bowel disease, such as ulcerative colitis or Crohn disease.  Are overweight.  Smoke cigarettes.  Do not get enough exercise.  Drink too much alcohol.  Eat a diet that is: ? High in fat and red meat. ? Low in fiber.  Had childhood cancer that was treated with abdominal radiation.  What are the signs or symptoms? Most polyps do not cause symptoms. If you have symptoms, they may include:  Blood coming from your rectum when having a bowel movement.  Blood in your stool.The stool may look dark red or black.  A change in bowel habits, such as constipation or diarrhea.  How is this diagnosed? This condition is diagnosed with a colonoscopy. This is a procedure that uses a lighted, flexible scope to look at the inside of your colon. How is this treated? Treatment for this condition involves removing any polyps that are found. Those polyps will then be tested for cancer. If cancer is found, your health care provider will talk to you about options for colon cancer treatment. Follow these instructions at home: Diet  Eat plenty of fiber, such as fruits, vegetables, and whole grains.  Eat foods that are high in calcium and vitamin D, such as milk, cheese, yogurt, eggs, liver, fish, and broccoli.  Limit foods high in fat, red meats, and processed meats, such as hot dogs, sausage,  bacon, and lunch meats.  Maintain a healthy weight, or lose weight if recommended by your health care provider. General instructions  Do not smoke cigarettes.  Do not drink alcohol excessively.  Keep all follow-up visits as told by your health care provider. This is important. This includes keeping regularly scheduled colonoscopies. Talk to your health care provider about when you need a colonoscopy.  Exercise every day or as told by your health care provider. Contact a health care provider if:  You have new or worsening bleeding during a bowel movement.  You have new or increased blood in your stool.  You have a change in bowel habits.  You unexpectedly lose weight. This information is not intended to replace advice given to you by your health care provider. Make sure you discuss any questions you have with your health care provider. Document Released: 03/11/2004 Document Revised: 11/21/2015 Document Reviewed: 05/06/2015 Elsevier Interactive Patient Education  2018 [employer]. Colonoscopy, Adult, Care After This sheet gives you information about how to care for yourself after your procedure. Your health care provider may also give you more specific instructions. If you have problems or questions, contact your health care provider. What can I expect after the procedure? After the procedure, it is common to have:  A small amount of blood in your stool for 24 hours after the procedure.  Some gas.  Mild abdominal cramping or bloating.  Follow these instructions at home: General instructions   For the first 24 hours after  the procedure: ? Do not drive or use machinery. ? Do not sign important documents. ? Do not drink alcohol. ? Do your regular daily activities at a slower pace than normal. ? Eat soft, easy-to-digest foods. ? Rest often.  Take over-the-counter or prescription medicines only as told by your health care provider.  It is up to you to get the results of  your procedure. Ask your health care provider, or the department performing the procedure, when your results will be ready. Relieving cramping and bloating  Try walking around when you have cramps or feel bloated.  Apply heat to your abdomen as told by your health care provider. Use a heat source that your health care provider recommends, such as a moist heat pack or a heating pad. ? Place a towel between your skin and the heat source. ? Leave the heat on for 20-30 minutes. ? Remove the heat if your skin turns bright red. This is especially important if you are unable to feel pain, heat, or cold. You may have a greater risk of getting burned. Eating and drinking  Drink enough fluid to keep your urine clear or pale yellow.  Resume your normal diet as instructed by your health care provider. Avoid heavy or fried foods that are hard to digest.  Avoid drinking alcohol for as long as instructed by your health care provider. Contact a health care provider if:  You have blood in your stool 2-3 days after the procedure. Get help right away if:  You have more than a small spotting of blood in your stool.  You pass large blood clots in your stool.  Your abdomen is swollen.  You have nausea or vomiting.  You have a fever.  You have increasing abdominal pain that is not relieved with medicine. This information is not intended to replace advice given to you by your health care provider. Make sure you discuss any questions you have with your health care provider. Document Released: 01/28/2004 Document Revised: 03/09/2016 Document Reviewed: 08/27/2015 Elsevier Interactive Patient Education  2018 [employer].  Colonoscopy Discharge Instructions  Read the instructions outlined below and refer to this sheet in the next few weeks. These discharge instructions provide you with general information on caring for yourself after you leave the hospital. Your doctor may also give you specific  instructions. While your treatment has been planned according to the most current medical practices available, unavoidable complications occasionally occur. If you have any problems or questions after discharge, call Dr. Gala Romney at [phone]. ACTIVITY  You may resume your regular activity, but move at a slower pace for the next 24 hours.   Take frequent rest periods for the next 24 hours.   Walking will help get rid of the air and reduce the bloated feeling in your belly (abdomen).   No driving for 24 hours (because of the medicine (anesthesia) used during the test).    Do not sign any important legal documents or operate any machinery for 24 hours (because of the anesthesia used during the test).  NUTRITION  Drink plenty of fluids.   You may resume your normal diet as instructed by your doctor.   Begin with a light meal and progress to your normal diet. Heavy or fried foods are harder to digest and may make you feel sick to your stomach (nauseated).   Avoid alcoholic beverages for 24 hours or as instructed.  MEDICATIONS  You may resume your normal medications unless your doctor tells you otherwise.  WHAT YOU CAN EXPECT TODAY  Some feelings of bloating in the abdomen.   Passage of more gas than usual.   Spotting of blood in your stool or on the toilet paper.  IF YOU HAD POLYPS REMOVED DURING THE COLONOSCOPY:  No aspirin products for 7 days or as instructed.   No alcohol for 7 days or as instructed.   Eat a soft diet for the next 24 hours.  FINDING OUT THE RESULTS OF YOUR TEST Not all test results are available during your visit. If your test results are not back during the visit, make an appointment with your caregiver to find out the results. Do not assume everything is normal if you have not heard from your caregiver or the medical facility. It is important for you to follow up on all of your test results.  SEEK IMMEDIATE MEDICAL ATTENTION IF:  You have more than a spotting  of blood in your stool.   Your belly is swollen (abdominal distention).   You are nauseated or vomiting.   You have a temperature over 101.   You have abdominal pain or discomfort that is severe or gets worse throughout the day.    polyp information provided  Further recommendations to follow  Pending review of pathology report

## 2017-07-29 NOTE — Transfer of Care (Signed)
Immediate Anesthesia Transfer of Care Note  Patient: Nicholas [name]  Procedure(s) Performed: COLONOSCOPY WITH PROPOFOL (N/A ) POLYPECTOMY  Patient Location: PACU  Anesthesia Type:MAC  Level of Consciousness: awake and patient cooperative  Airway & Oxygen Therapy: Patient Spontanous Breathing and Patient connected to face mask oxygen  Post-op Assessment: Report given to RN, Post -op Vital signs reviewed and stable and Patient moving all extremities  Post vital signs: Reviewed and stable  Last Vitals:  Vitals:   07/29/17 1110 07/29/17 1115  BP: 130/64 132/67  Pulse:    Resp: 14 17  Temp:    SpO2: 100% 100%    Last Pain:  Vitals:   07/29/17 1034  TempSrc: Oral         Complications: No apparent anesthesia complications

## 2017-07-29 NOTE — Anesthesia Postprocedure Evaluation (Signed)
Anesthesia Post Note  Patient: Nicholas [name]  Procedure(s) Performed: COLONOSCOPY WITH PROPOFOL (N/A ) POLYPECTOMY  Patient location during evaluation: PACU Anesthesia Type: MAC Level of consciousness: awake and alert and patient cooperative Pain management: pain level controlled Vital Signs Assessment: post-procedure vital signs reviewed and stable Respiratory status: spontaneous breathing, nonlabored ventilation and respiratory function stable Cardiovascular status: blood pressure returned to baseline Postop Assessment: no apparent nausea or vomiting Anesthetic complications: no     Last Vitals:  Vitals:   07/29/17 1110 07/29/17 1115  BP: 130/64 132/67  Pulse:    Resp: 14 17  Temp:    SpO2: 100% 100%    Last Pain:  Vitals:   07/29/17 1034  TempSrc: Oral                 [name]

## 2017-08-02 ENCOUNTER — Encounter: Payer: Self-pay | Admitting: Internal Medicine

## 2017-08-02 ENCOUNTER — Encounter (HOSPITAL_COMMUNITY): Payer: Self-pay | Admitting: Internal Medicine

## 2017-08-04 ENCOUNTER — Other Ambulatory Visit: Payer: Self-pay | Admitting: "Endocrinology

## 2017-08-14 ENCOUNTER — Other Ambulatory Visit: Payer: Self-pay | Admitting: Family Medicine

## 2017-08-17 ENCOUNTER — Ambulatory Visit: Payer: Medicare HMO | Admitting: Sports Medicine

## 2017-08-17 ENCOUNTER — Encounter: Payer: Self-pay | Admitting: Sports Medicine

## 2017-08-17 DIAGNOSIS — E11610 Type 2 diabetes mellitus with diabetic neuropathic arthropathy: Secondary | ICD-10-CM

## 2017-08-17 DIAGNOSIS — L97511 Non-pressure chronic ulcer of other part of right foot limited to breakdown of skin: Secondary | ICD-10-CM

## 2017-08-17 DIAGNOSIS — E1140 Type 2 diabetes mellitus with diabetic neuropathy, unspecified: Secondary | ICD-10-CM

## 2017-08-17 DIAGNOSIS — E1149 Type 2 diabetes mellitus with other diabetic neurological complication: Secondary | ICD-10-CM

## 2017-08-17 DIAGNOSIS — I739 Peripheral vascular disease, unspecified: Secondary | ICD-10-CM

## 2017-08-17 NOTE — Progress Notes (Signed)
Patient ID: Nicholas [name], male   DOB: August 29, 1955, 62 y.o.   MRN: [id number]  Subjective: Nicholas [name] is a 62 y.o. Diabetic male patient seen in office for follow up of wound at right 1st toe, states that it has been bleeding has been applying antibiotic cream and vaseline to area when he can wrap it. States that the compression/surgi-tube stockinettes help and wants more. Patient admits to ocassional nausea treated with Omperazole.. Denies any other symptoms.    Patient Active Problem List   Diagnosis Date Noted  . Type 2 diabetes mellitus with diabetic nephropathy, without long-term current use of insulin ([place]) 06/23/2017  . Neck pain on left side 04/10/2017  . Dental caries 04/10/2017  . Hypothyroidism 08/19/2016  . GERD (gastroesophageal reflux disease) 04/14/2016  . Charcot foot due to diabetes mellitus ([place]) 01/28/2016  . Benign essential tremor 01/28/2016  . Skin lesion of back 10/07/2015  . Back pain with left-sided radiculopathy 06/05/2015  . Hyperlipidemia 04/10/2015  . Abdominal wall bulge 01/15/2015  . Hx of adenomatous colonic polyps 02/21/2014  . Allergic rhinitis 07/24/2013  . Onychomycosis 05/21/2013  . 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 Back pain with radiation 04/20/2011  . Seborrheic dermatitis 11/20/2010  . Dermatomycosis 11/20/2010  . FATIGUE 04/01/2010  . CARPAL TUNNEL SYNDROME, BILATERAL 03/24/2010  . Hypogonadism in male 01/27/2010  . DEPRESSION/ANXIETY 01/27/2010  . CHF 01/13/2010  . DYSPNEA 01/13/2010  . Diabetes mellitus type 2 in obese ([place]) 12/12/2009  . MORBID OBESITY 12/12/2009  . Benign hypertension 12/12/2009  . PVD (peripheral vascular disease) ([place]) 12/12/2009   Current Outpatient Medications on File Prior to Visit  Medication Sig Dispense Refill  . ANDROGEL PUMP 20.25 MG/ACT (1.62%) GEL Apply 2 application topically daily at 2 PM. Apply 1 pump to each shoulder daily in the afternoon    . aspirin EC  81 MG tablet Take 81 mg by mouth every evening.     . brimonidine-timolol (COMBIGAN) 0.2-0.5 % ophthalmic solution Place 1 drop into both eyes 2 (two) times daily.     . citalopram (CELEXA) 40 MG tablet TAKE 1 TABLET BY MOUTH ONCE A DAY. (Patient taking differently: TAKE 1 TABLET BY MOUTH ONCE A DAY AT BEDTIME.) 90 tablet 0  . cyclobenzaprine (FLEXERIL) 10 MG tablet TAKE (1) TABLET BY MOUTH EVERY EIGHT HOURS AS NEEDED FOR MUSCLE SPASMS. 30 tablet 0  . gabapentin (NEURONTIN) 600 MG tablet TAKE ONE TABLET BY MOUTH 3 TIMES DAILY. 270 tablet 0  . HUMULIN R U-500 KWIKPEN 500 UNIT/ML kwikpen INJECT UP TO 100 UNITS SUBCUTANEOUSLY 30 MINUTES BEFORE MEALS. 18 mL 0  . hydrochlorothiazide (HYDRODIURIL) 25 MG tablet Take 1 tablet (25 mg total) by mouth daily. (Patient taking differently: Take 12.5 mg by mouth daily. ) 30 tablet 1  . hydrochlorothiazide (HYDRODIURIL) 25 MG tablet TAKE 1 TABLET BY MOUTH ONCE A DAY. 30 tablet 5  . ketoconazole (NIZORAL) 2 % shampoo APPLY TOPICALLY TWICE WEEKLY AS DIRECTED. (Patient taking differently: APPLY TOPICALLY TWICE WEEKLY AS NEEDED FOR DRY/ITCHY/FLAKY SCALP.) 120 mL 0  . lamoTRIgine (LAMICTAL) 200 MG tablet TAKE 1 TABLET BY MOUTH ONCE A DAY. 30 tablet 5  . levothyroxine (SYNTHROID, LEVOTHROID) 88 MCG tablet TAKE ONE TABLET BY MOUTH DAILY BEFORE BREAKFAST. 30 tablet 3  . lisinopril (PRINIVIL,ZESTRIL) 5 MG tablet TAKE 1 TABLET BY MOUTH ONCE A DAY. (Patient taking differently: TAKE 1 TABLET BY MOUTH ONCE A DAY AT NIGHT) 30 tablet 0  . lisinopril (PRINIVIL,ZESTRIL) 5  MG tablet TAKE 1 TABLET BY MOUTH ONCE A DAY. 30 tablet 5  . metoprolol succinate (TOPROL-XL) 25 MG 24 hr tablet TAKE (1) TABLET BY MOUTH ONCE DAILY. 90 tablet 1  . Na Sulfate-K Sulfate-Mg Sulf 17.5-3.13-1.6 GM/177ML SOLN Take 1 kit by mouth as directed. 1 Bottle 0  . naproxen (NAPROSYN) 500 MG tablet TAKE ONE TABLET BY MOUTH TWICE DAILY WITH A MEAL. (Patient taking differently: TAKE ONE TABLET BY MOUTH TWICE DAILY  AS NEEDED FOR PAIN.) 60 tablet 5  . NOVOFINE 30G X 8 MM MISC     . pantoprazole (PROTONIX) 40 MG tablet TAKE (1) TABLET BY MOUTH TWICE DAILY. 180 tablet 3  . simvastatin (ZOCOR) 40 MG tablet TAKE 1 TABLET BY MOUTH ONCE A DAY. (Patient taking differently: TAKE 1 TABLET BY MOUTH ONCE A DAY IN THE AFTERNOON.) 90 tablet 1  . SURE COMFORT PEN NEEDLES 31G X 8 MM MISC USE TWICE DAILY WITH BYETTA 100 each 2  . traMADol (ULTRAM) 50 MG tablet TAKE 2 TABLETS BY MOUTH EVERY 8 HOURS AS NEEDED. (Patient taking differently: Take 50-100 mg by mouth 3 (three) times daily as needed (FOR PAIN.). TAKE 2 TABLETS BY MOUTH EVERY 8 HOURS AS NEEDED.) 180 tablet 0  . TRAVATAN Z 0.004 % SOLN ophthalmic solution Place 1 drop into both eyes at bedtime.     . triamcinolone ointment (KENALOG) 0.1 % APPLY TO AFFECTED AREAS TWICE DAILY. (Patient taking differently: APPLY TO AFFECTED AREAS TWICE DAILY AS NEEDED FOR DRY/ITCHY SKIN.) 454 g 0  . VICTOZA 18 MG/3ML SOPN INJECT 1.8 MG SUBCUTANEOUSLY ONCE DAILY. (Patient taking differently: INJECT 1.8 MG SUBCUTANEOUSLY ONCE DAILY AT NIGHT.) 9 mL 2  . Vitamin D, Ergocalciferol, (DRISDOL) 50000 units CAPS capsule TAKE ONE CAPSULE BY MOUTH ONCE WEEKLY. (Patient taking differently: TAKE ONE CAPSULE BY MOUTH ONCE WEEKLY ON SUNDAYS.) 12 capsule 0   Current Facility-Administered Medications on File Prior to Visit  Medication Dose Route Frequency Provider Last Rate Last Dose  . triamcinolone acetonide (KENALOG-40) injection 20 mg  20 mg Other Once Landis Martins, DPM       Allergies  Allergen Reactions  . No Allergies On File   . No Known Allergies     Recent Results (from the past 2160 hour(s))  Renal function panel     Status: Abnormal   Collection Time: 05/26/17 11:00 AM  Result Value Ref Range   Glucose, Bld 142 (H) 65 - 139 mg/dL    Comment: .        Non-fasting reference interval .    BUN 23 7 - 25 mg/dL   Creat 1.31 (H) 0.70 - 1.25 mg/dL    Comment: For patients >49 years of  age, the reference limit for Creatinine is approximately 13% higher for people identified as African-American. .    BUN/Creatinine Ratio 18 6 - 22 (calc)   Sodium 140 135 - 146 mmol/L   Potassium 5.2 3.5 - 5.3 mmol/L   Chloride 103 98 - 110 mmol/L   CO2 32 20 - 32 mmol/L   Calcium 9.0 8.6 - 10.3 mg/dL   Phosphorus 3.7 2.5 - 4.5 mg/dL   Albumin 3.8 3.6 - 5.1 g/dL  Hemoglobin A1c     Status: Abnormal   Collection Time: 05/26/17 11:00 AM  Result Value Ref Range   Hgb A1c MFr Bld 8.0 (H) <5.7 % of total Hgb    Comment: For someone without known diabetes, a hemoglobin A1c value of 6.5% or greater indicates that  they may have  diabetes and this should be confirmed with a follow-up  test. . For someone with known diabetes, a value <7% indicates  that their diabetes is well controlled and a value  greater than or equal to 7% indicates suboptimal  control. A1c targets should be individualized based on  duration of diabetes, age, comorbid conditions, and  other considerations. . Currently, no consensus exists regarding use of hemoglobin A1c for diagnosis of diabetes for children. .    Mean Plasma Glucose 183 (calc)   eAG (mmol/L) 10.1 (calc)  TSH     Status: None   Collection Time: 05/26/17 11:00 AM  Result Value Ref Range   TSH 1.44 0.40 - 4.50 mIU/L  T4, free     Status: None   Collection Time: 05/26/17 11:00 AM  Result Value Ref Range   Free T4 1.1 0.8 - 1.8 ng/dL  Lipid panel     Status: Abnormal   Collection Time: 06/01/17  7:59 AM  Result Value Ref Range   Cholesterol 164 <200 mg/dL   HDL 50 >40 mg/dL   Triglycerides 181 (H) <150 mg/dL   LDL Cholesterol (Calc) 86 mg/dL (calc)    Comment: Reference range: <100 . Desirable range <100 mg/dL for primary prevention;   <70 mg/dL for patients with CHD or diabetic patients  with > or = 2 CHD risk factors. Marland Kitchen LDL-C is now calculated using the Martin-Hopkins  calculation, which is a validated novel method providing  better  accuracy than the Friedewald equation in the  estimation of LDL-C.  Cresenciano Genre et al. Annamaria Helling. 4132;440(10): 2061-2068  (http://education.[facility].com/faq/FAQ164)    Total CHOL/HDL Ratio 3.3 <5.0 (calc)   Non-HDL Cholesterol (Calc) 114 <130 mg/dL (calc)    Comment: For patients with diabetes plus 1 major ASCVD risk  factor, treating to a non-HDL-C goal of <100 mg/dL  (LDL-C of <70 mg/dL) is considered a therapeutic  option.   Hepatic function panel     Status: None   Collection Time: 06/01/17  7:59 AM  Result Value Ref Range   Total Protein 6.4 6.1 - 8.1 g/dL   Albumin 3.8 3.6 - 5.1 g/dL   Globulin 2.6 1.9 - 3.7 g/dL (calc)   AG Ratio 1.5 1.0 - 2.5 (calc)   Total Bilirubin 0.5 0.2 - 1.2 mg/dL   Bilirubin, Direct 0.1 0.0 - 0.2 mg/dL   Indirect Bilirubin 0.4 0.2 - 1.2 mg/dL (calc)   Alkaline phosphatase (APISO) 79 40 - 115 U/L   AST 22 10 - 35 U/L   ALT 20 9 - 46 U/L  CBC     Status: Abnormal   Collection Time: 06/01/17  7:59 AM  Result Value Ref Range   WBC 13.3 (H) 3.8 - 10.8 Thousand/uL   RBC 5.02 4.20 - 5.80 Million/uL   Hemoglobin 14.1 13.2 - 17.1 g/dL   HCT 43.0 38.5 - 50.0 %   MCV 85.7 80.0 - 100.0 fL   MCH 28.1 27.0 - 33.0 pg   MCHC 32.8 32.0 - 36.0 g/dL   RDW 15.6 (H) 11.0 - 15.0 %   Platelets 253 140 - 400 Thousand/uL   MPV 11.6 7.5 - 12.5 fL  CBC     Status: Abnormal   Collection Time: 07/23/17 10:26 AM  Result Value Ref Range   WBC 11.1 (H) 4.0 - 10.5 K/uL   RBC 5.02 4.22 - 5.81 MIL/uL   Hemoglobin 13.7 13.0 - 17.0 g/dL   HCT 44.8 39.0 - 52.0 %  MCV 89.2 78.0 - 100.0 fL   MCH 27.3 26.0 - 34.0 pg   MCHC 30.6 30.0 - 36.0 g/dL   RDW 15.8 (H) 11.5 - 15.5 %   Platelets 229 150 - 400 K/uL  Basic metabolic panel     Status: Abnormal   Collection Time: 07/23/17 10:26 AM  Result Value Ref Range   Sodium 140 135 - 145 mmol/L   Potassium 4.5 3.5 - 5.1 mmol/L   Chloride 102 101 - 111 mmol/L   CO2 27 22 - 32 mmol/L   Glucose, Bld 184 (H) 65 - 99 mg/dL   BUN  21 (H) 6 - 20 mg/dL   Creatinine, Ser 1.13 0.61 - 1.24 mg/dL   Calcium 9.5 8.9 - 10.3 mg/dL   GFR calc non Af Amer >60 >60 mL/min   GFR calc Af Amer >60 >60 mL/min    Comment: (NOTE) The eGFR has been calculated using the CKD EPI equation. This calculation has not been validated in all clinical situations. eGFR's persistently <60 mL/min signify possible Chronic Kidney Disease.    Anion gap 11 5 - 15  Glucose, capillary     Status: Abnormal   Collection Time: 07/29/17 10:45 AM  Result Value Ref Range   Glucose-Capillary 155 (H) 65 - 99 mg/dL  Glucose, capillary     Status: Abnormal   Collection Time: 07/29/17 12:25 PM  Result Value Ref Range   Glucose-Capillary 169 (H) 65 - 99 mg/dL    Objective: There were no vitals filed for this visit.  General: Patient is awake, alert, oriented x 3 and in no acute distress.  Dermatology: Skin is warm and dry bilateral with a partial thickness ulceration measures 0.5x0.4x0.1cm with granular base at right hallux with surrounding reactive keratosis no signs of acute infection at site. Nails are mildly elongated and thick with no signs of ingrowing or infection.    Vascular: Dorsalis Pedis pulse = 0/4 Bilateral,  Posterior Tibial pulse = 1/4 Bilateral faint,  Capillary Fill Time < 5 seconds, + chronic venous skin changes bilateral.  Neurologic:Protective sensation diminished using the 5.07/10g Semmes Weinstein Monofilament. Vibratory diminished bilateral.   Musculosketal:  Subjective pain to right arch. No pain to plantar medial and dorsal midfoot on left with accompanying prominent bones supportive of Charcot. No Pain with palpation to healed ulcerated area at right hallux. + Hallux interphalangeus R>L hallux, No pain with compression to calves bilateral.   Assessment and Plan:  Problem List Items Addressed This Visit      Cardiovascular and Mediastinum   PVD (peripheral vascular disease) ([place])     Endocrine   Charcot foot due to diabetes  mellitus ([place])    Other Visit Diagnoses    Toe ulcer, right, limited to breakdown of skin ([place])    -  Primary   Type II diabetes mellitus with neurological manifestations ([place])       Diabetic neuropathy with neurologic complication ([place])         -Examined patient  -Mechanically debrided ulceration at right 1st toe using sterile chisel blade and dressed with betadine and coban dressing. Advised patient to do the same until healed -Applied offloading pad to right insole  -Continue with gabapentin  -Continue with diabetic shoes  -Patient to return to office in 4 weeks for wound check on right or sooner if problems arise.  Landis Martins, DPM

## 2017-08-20 ENCOUNTER — Ambulatory Visit: Payer: Medicare HMO | Admitting: Urology

## 2017-08-20 DIAGNOSIS — Z125 Encounter for screening for malignant neoplasm of prostate: Secondary | ICD-10-CM | POA: Diagnosis not present

## 2017-08-20 DIAGNOSIS — E291 Testicular hypofunction: Secondary | ICD-10-CM

## 2017-08-21 ENCOUNTER — Other Ambulatory Visit: Payer: Self-pay | Admitting: Family Medicine

## 2017-09-01 DIAGNOSIS — E039 Hypothyroidism, unspecified: Secondary | ICD-10-CM | POA: Diagnosis not present

## 2017-09-01 DIAGNOSIS — E1142 Type 2 diabetes mellitus with diabetic polyneuropathy: Secondary | ICD-10-CM | POA: Diagnosis not present

## 2017-09-01 DIAGNOSIS — Z794 Long term (current) use of insulin: Secondary | ICD-10-CM | POA: Diagnosis not present

## 2017-09-01 DIAGNOSIS — E1165 Type 2 diabetes mellitus with hyperglycemia: Secondary | ICD-10-CM | POA: Diagnosis not present

## 2017-09-01 DIAGNOSIS — E11620 Type 2 diabetes mellitus with diabetic dermatitis: Secondary | ICD-10-CM | POA: Diagnosis not present

## 2017-09-01 DIAGNOSIS — E785 Hyperlipidemia, unspecified: Secondary | ICD-10-CM | POA: Diagnosis not present

## 2017-09-01 DIAGNOSIS — E291 Testicular hypofunction: Secondary | ICD-10-CM | POA: Diagnosis not present

## 2017-09-01 DIAGNOSIS — R69 Illness, unspecified: Secondary | ICD-10-CM | POA: Diagnosis not present

## 2017-09-01 DIAGNOSIS — L98499 Non-pressure chronic ulcer of skin of other sites with unspecified severity: Secondary | ICD-10-CM | POA: Diagnosis not present

## 2017-09-13 ENCOUNTER — Other Ambulatory Visit: Payer: Self-pay | Admitting: "Endocrinology

## 2017-09-14 ENCOUNTER — Encounter: Payer: Self-pay | Admitting: Sports Medicine

## 2017-09-14 ENCOUNTER — Ambulatory Visit: Payer: Medicare HMO | Admitting: Sports Medicine

## 2017-09-14 DIAGNOSIS — E1149 Type 2 diabetes mellitus with other diabetic neurological complication: Secondary | ICD-10-CM

## 2017-09-14 DIAGNOSIS — E1169 Type 2 diabetes mellitus with other specified complication: Secondary | ICD-10-CM | POA: Diagnosis not present

## 2017-09-14 DIAGNOSIS — E669 Obesity, unspecified: Secondary | ICD-10-CM | POA: Diagnosis not present

## 2017-09-14 DIAGNOSIS — E11610 Type 2 diabetes mellitus with diabetic neuropathic arthropathy: Secondary | ICD-10-CM

## 2017-09-14 DIAGNOSIS — L97511 Non-pressure chronic ulcer of other part of right foot limited to breakdown of skin: Secondary | ICD-10-CM | POA: Diagnosis not present

## 2017-09-14 NOTE — Patient Instructions (Signed)
Use Lamisil spray to left foot Get this spray over the counter

## 2017-09-14 NOTE — Progress Notes (Signed)
Patient ID: Nicholas [name], male   DOB: December 24, 1955, 62 y.o.   MRN: [id number]  Subjective: Nicholas [name] is a 62 y.o. Diabetic male patient seen in office for follow up of wound at right 1st toe, states that it has been doing good, has been applying betadine to area when he can wrap it. States that he has some dry skin on left foot that he wants me to look at. Denies any other symptoms.    Patient Active Problem List   Diagnosis Date Noted  . Type 2 diabetes mellitus with diabetic nephropathy, without long-term current use of insulin ([place]) 06/23/2017  . Neck pain on left side 04/10/2017  . Dental caries 04/10/2017  . Hypothyroidism 08/19/2016  . GERD (gastroesophageal reflux disease) 04/14/2016  . Charcot foot due to diabetes mellitus ([place]) 01/28/2016  . Benign essential tremor 01/28/2016  . Skin lesion of back 10/07/2015  . Back pain with left-sided radiculopathy 06/05/2015  . Hyperlipidemia 04/10/2015  . Abdominal wall bulge 01/15/2015  . Hx of adenomatous colonic polyps 02/21/2014  . Allergic rhinitis 07/24/2013  . Onychomycosis 05/21/2013  . Type 2 diabetes mellitus with pressure callus ([place]) 05/21/2013  . Back pain with radiation 04/20/2011  . Seborrheic dermatitis 11/20/2010  . Dermatomycosis 11/20/2010  . FATIGUE 04/01/2010  . CARPAL TUNNEL SYNDROME, BILATERAL 03/24/2010  . Hypogonadism in male 01/27/2010  . DEPRESSION/ANXIETY 01/27/2010  . CHF 01/13/2010  . DYSPNEA 01/13/2010  . Diabetes mellitus type 2 in obese ([place]) 12/12/2009  . MORBID OBESITY 12/12/2009  . Benign hypertension 12/12/2009  . PVD (peripheral vascular disease) ([place]) 12/12/2009   Current Outpatient Medications on File Prior to Visit  Medication Sig Dispense Refill  . ANDROGEL PUMP 20.25 MG/ACT (1.62%) GEL Apply 2 application topically daily at 2 PM. Apply 1 pump to each shoulder daily in the afternoon    . aspirin EC 81 MG tablet Take 81 mg by mouth every evening.     . brimonidine-timolol  (COMBIGAN) 0.2-0.5 % ophthalmic solution Place 1 drop into both eyes 2 (two) times daily.     . citalopram (CELEXA) 40 MG tablet TAKE 1 TABLET BY MOUTH ONCE A DAY. 90 tablet 0  . cyclobenzaprine (FLEXERIL) 10 MG tablet TAKE (1) TABLET BY MOUTH EVERY EIGHT HOURS AS NEEDED FOR MUSCLE SPASMS. 30 tablet 0  . gabapentin (NEURONTIN) 600 MG tablet TAKE ONE TABLET BY MOUTH 3 TIMES DAILY. 270 tablet 0  . HUMULIN R U-500 KWIKPEN 500 UNIT/ML kwikpen INJECT UP TO 100 UNITS SUBCUTANEOUSLY 30 MINUTES BEFORE MEALS. 18 mL 0  . hydrochlorothiazide (HYDRODIURIL) 25 MG tablet Take 1 tablet (25 mg total) by mouth daily. (Patient taking differently: Take 12.5 mg by mouth daily. ) 30 tablet 1  . hydrochlorothiazide (HYDRODIURIL) 25 MG tablet TAKE 1 TABLET BY MOUTH ONCE A DAY. 30 tablet 5  . ketoconazole (NIZORAL) 2 % shampoo APPLY TOPICALLY TWICE WEEKLY AS DIRECTED. (Patient taking differently: APPLY TOPICALLY TWICE WEEKLY AS NEEDED FOR DRY/ITCHY/FLAKY SCALP.) 120 mL 0  . lamoTRIgine (LAMICTAL) 200 MG tablet TAKE 1 TABLET BY MOUTH ONCE A DAY. 30 tablet 5  . levothyroxine (SYNTHROID, LEVOTHROID) 88 MCG tablet TAKE ONE TABLET BY MOUTH DAILY BEFORE BREAKFAST. 30 tablet 3  . lisinopril (PRINIVIL,ZESTRIL) 5 MG tablet TAKE 1 TABLET BY MOUTH ONCE A DAY. (Patient taking differently: TAKE 1 TABLET BY MOUTH ONCE A DAY AT NIGHT) 30 tablet 0  . lisinopril (PRINIVIL,ZESTRIL) 5 MG tablet TAKE 1 TABLET BY MOUTH ONCE A DAY. 30 tablet 5  .  metoprolol succinate (TOPROL-XL) 25 MG 24 hr tablet TAKE (1) TABLET BY MOUTH ONCE DAILY. 90 tablet 1  . Na Sulfate-K Sulfate-Mg Sulf 17.5-3.13-1.6 GM/177ML SOLN Take 1 kit by mouth as directed. 1 Bottle 0  . naproxen (NAPROSYN) 500 MG tablet TAKE ONE TABLET BY MOUTH TWICE DAILY WITH A MEAL. (Patient taking differently: TAKE ONE TABLET BY MOUTH TWICE DAILY AS NEEDED FOR PAIN.) 60 tablet 5  . NOVOFINE 30G X 8 MM MISC     . pantoprazole (PROTONIX) 40 MG tablet TAKE (1) TABLET BY MOUTH TWICE DAILY. 180  tablet 3  . simvastatin (ZOCOR) 40 MG tablet TAKE 1 TABLET BY MOUTH ONCE A DAY. (Patient taking differently: TAKE 1 TABLET BY MOUTH ONCE A DAY IN THE AFTERNOON.) 90 tablet 1  . SURE COMFORT PEN NEEDLES 31G X 8 MM MISC USE TWICE DAILY WITH BYETTA 100 each 2  . traMADol (ULTRAM) 50 MG tablet TAKE 2 TABLETS BY MOUTH EVERY 8 HOURS AS NEEDED. (Patient taking differently: Take 50-100 mg by mouth 3 (three) times daily as needed (FOR PAIN.). TAKE 2 TABLETS BY MOUTH EVERY 8 HOURS AS NEEDED.) 180 tablet 0  . TRAVATAN Z 0.004 % SOLN ophthalmic solution Place 1 drop into both eyes at bedtime.     . triamcinolone ointment (KENALOG) 0.1 % APPLY TO AFFECTED AREAS TWICE DAILY. (Patient taking differently: APPLY TO AFFECTED AREAS TWICE DAILY AS NEEDED FOR DRY/ITCHY SKIN.) 454 g 0  . VICTOZA 18 MG/3ML SOPN INJECT 1.8 MG SUBCUTANEOUSLY ONCE DAILY. (Patient taking differently: INJECT 1.8 MG SUBCUTANEOUSLY ONCE DAILY AT NIGHT.) 9 mL 2  . Vitamin D, Ergocalciferol, (DRISDOL) 50000 units CAPS capsule TAKE ONE CAPSULE BY MOUTH ONCE WEEKLY. (Patient taking differently: TAKE ONE CAPSULE BY MOUTH ONCE WEEKLY ON SUNDAYS.) 12 capsule 0   Current Facility-Administered Medications on File Prior to Visit  Medication Dose Route Frequency Provider Last Rate Last Dose  . triamcinolone acetonide (KENALOG-40) injection 20 mg  20 mg Other Once Landis Martins, DPM       Allergies  Allergen Reactions  . No Allergies On File   . No Known Allergies     Recent Results (from the past 2160 hour(s))  CBC     Status: Abnormal   Collection Time: 07/23/17 10:26 AM  Result Value Ref Range   WBC 11.1 (H) 4.0 - 10.5 K/uL   RBC 5.02 4.22 - 5.81 MIL/uL   Hemoglobin 13.7 13.0 - 17.0 g/dL   HCT 44.8 39.0 - 52.0 %   MCV 89.2 78.0 - 100.0 fL   MCH 27.3 26.0 - 34.0 pg   MCHC 30.6 30.0 - 36.0 g/dL   RDW 15.8 (H) 11.5 - 15.5 %   Platelets 229 150 - 400 K/uL  Basic metabolic panel     Status: Abnormal   Collection Time: 07/23/17 10:26 AM   Result Value Ref Range   Sodium 140 135 - 145 mmol/L   Potassium 4.5 3.5 - 5.1 mmol/L   Chloride 102 101 - 111 mmol/L   CO2 27 22 - 32 mmol/L   Glucose, Bld 184 (H) 65 - 99 mg/dL   BUN 21 (H) 6 - 20 mg/dL   Creatinine, Ser 1.13 0.61 - 1.24 mg/dL   Calcium 9.5 8.9 - 10.3 mg/dL   GFR calc non Af Amer >60 >60 mL/min   GFR calc Af Amer >60 >60 mL/min    Comment: (NOTE) The eGFR has been calculated using the CKD EPI equation. This calculation has not been validated in  all clinical situations. eGFR's persistently <60 mL/min signify possible Chronic Kidney Disease.    Anion gap 11 5 - 15  Glucose, capillary     Status: Abnormal   Collection Time: 07/29/17 10:45 AM  Result Value Ref Range   Glucose-Capillary 155 (H) 65 - 99 mg/dL  Glucose, capillary     Status: Abnormal   Collection Time: 07/29/17 12:25 PM  Result Value Ref Range   Glucose-Capillary 169 (H) 65 - 99 mg/dL    Objective: There were no vitals filed for this visit.  General: Patient is awake, alert, oriented x 3 and in no acute distress.  Dermatology: Skin is warm and dry bilateral with a partial thickness ulceration measures 0.3x0.2x0.1cm with granular base at right hallux with surrounding reactive keratosis no signs of acute infection at site. Nails are short and thick with no signs of ingrowing or infection. Dry skin on left foot with no signs of infection.    Vascular: Dorsalis Pedis pulse = 0/4 Bilateral,  Posterior Tibial pulse = 1/4 Bilateral faint,  Capillary Fill Time < 5 seconds, + chronic venous skin changes bilateral.  Neurologic:Protective sensation diminished using the 5.07/10g Semmes Weinstein Monofilament. Vibratory diminished bilateral.   Musculosketal:  Subjective pain to right arch. No pain to plantar medial and dorsal midfoot on left with accompanying prominent bones supportive of Charcot. No Pain with palpation to healed ulcerated area at right hallux. + Hallux interphalangeus R>L hallux, No pain with  compression to calves bilateral.   Assessment and Plan:  Problem List Items Addressed This Visit      Endocrine   Charcot foot due to diabetes mellitus ([place])    Other Visit Diagnoses    Toe ulcer, right, limited to breakdown of skin ([place])    -  Primary   Type II diabetes mellitus with neurological manifestations ([place])         -Examined patient  -Mechanically debrided ulceration at right 1st toe using sterile chisel blade and dressed with betadine and coban dressing. Advised patient to do the same until healed like before -Continue with offloading pad to right insole  -Continue with gabapentin  -Continue with diabetic shoes  -Recommend lamisil spray to left foot  -Patient to return to office in 4-6 weeks for wound check on right or sooner if problems arise.  Landis Martins, DPM

## 2017-09-15 LAB — COMPLETE METABOLIC PANEL WITH GFR
AG Ratio: 1.7 (calc) (ref 1.0–2.5)
ALT: 18 U/L (ref 9–46)
AST: 20 U/L (ref 10–35)
Albumin: 4 g/dL (ref 3.6–5.1)
Alkaline phosphatase (APISO): 79 U/L (ref 40–115)
BILIRUBIN TOTAL: 0.4 mg/dL (ref 0.2–1.2)
BUN: 16 mg/dL (ref 7–25)
CHLORIDE: 103 mmol/L (ref 98–110)
CO2: 33 mmol/L — ABNORMAL HIGH (ref 20–32)
Calcium: 9.7 mg/dL (ref 8.6–10.3)
Creat: 1.14 mg/dL (ref 0.70–1.25)
GFR, EST AFRICAN AMERICAN: 79 mL/min/{1.73_m2} (ref >=60)
GFR, Est Non African American: 69 mL/min/{1.73_m2} (ref >=60)
GLUCOSE: 182 mg/dL — AB (ref 65–139)
Globulin: 2.3 g/dL (calc) (ref 1.9–3.7)
Potassium: 5.4 mmol/L — ABNORMAL HIGH (ref 3.5–5.3)
Sodium: 142 mmol/L (ref 135–146)
TOTAL PROTEIN: 6.3 g/dL (ref 6.1–8.1)

## 2017-09-15 LAB — HEMOGLOBIN A1C
Hgb A1c MFr Bld: 7.6 % of total Hgb — ABNORMAL HIGH (ref <5.7)
MEAN PLASMA GLUCOSE: 171 (calc)
eAG (mmol/L): 9.5 (calc)

## 2017-09-23 ENCOUNTER — Ambulatory Visit: Payer: Medicare HMO | Admitting: "Endocrinology

## 2017-09-23 ENCOUNTER — Encounter: Payer: Self-pay | Admitting: "Endocrinology

## 2017-09-23 VITALS — BP 135/77 | HR 66 | Ht 72.0 in | Wt >= 6400 oz

## 2017-09-23 DIAGNOSIS — E782 Mixed hyperlipidemia: Secondary | ICD-10-CM

## 2017-09-23 DIAGNOSIS — E1121 Type 2 diabetes mellitus with diabetic nephropathy: Secondary | ICD-10-CM | POA: Diagnosis not present

## 2017-09-23 DIAGNOSIS — I10 Essential (primary) hypertension: Secondary | ICD-10-CM

## 2017-09-23 DIAGNOSIS — E039 Hypothyroidism, unspecified: Secondary | ICD-10-CM | POA: Diagnosis not present

## 2017-09-23 MED ORDER — FREESTYLE LIBRE 14 DAY READER DEVI: 1.0000 | Freq: Once | 0 refills | Status: AC

## 2017-09-23 MED ORDER — FREESTYLE LIBRE 14 DAY SENSOR MISC: 1.0000 | 2 refills | Status: DC

## 2017-09-23 NOTE — Progress Notes (Signed)
Subjective:    Patient ID: Nicholas [name], male    DOB: Dec 06, 1955,    Past Medical History:  Diagnosis Date  . Chronic knee pain   . Depression   . Diabetes ([place])   . Glaucoma   . Hyperlipidemia   . Hypertension   . MRSA (methicillin resistant Staphylococcus aureus)    patient denies  . Neuropathy   . Poor circulation    leg   . Suicide attempt ([place])   . Testosterone deficiency    Past Surgical History:  Procedure Laterality Date  . CHOLECYSTECTOMY N/A 02/25/2015   Procedure: LAPAROSCOPIC CHOLECYSTECTOMY;  Surgeon: Aviva Signs, MD;  Location: [facility];  Service: General;  Laterality: N/A;  . COLONOSCOPY  03/2008   XAJ:OINOMV external hemorrhoidal tag, otherwise normal rectum/Two diminutive rectosigmoid polyps s/p bx/ Polyp in the opposite the ileocecal valve s/p bx. tubular adenoma.  . COLONOSCOPY N/A 03/23/2014   EHM:CNOBSJGG colon polyps throughout his rectum and colon. Sessile and pedunculated. The largest polyp was proximally 6 mm in dimensions in the retum at 3 cm with adjacent diminutive polyp.  There was also a polyps on ileocecal valve, ascending segmen descending and sigmoid segment. as the colon was tortuous & elongated requiring externa abd pressure and changing of the pts postion to reach cecum  . COLONOSCOPY WITH PROPOFOL N/A 07/29/2017   Procedure: COLONOSCOPY WITH PROPOFOL;  Surgeon: Daneil Dolin, MD;  Location: [facility];  Service: Endoscopy;  Laterality: N/A;  12:30pm  . ESOPHAGOGASTRODUODENOSCOPY N/A 03/23/2014   RMR: 1. Patient had circumferential distal esophageal erosions within 5 mm of the GE junction.  No Barrett's esophagus. Tubular esophagus patent throughout its course. 2.  Diffuse gastric erosions. (1) 24m area of healing ulceration in the antrum. No ulcer or infiltrating process observed. Patent pylorus. Normal-appearing first and second portion of the duodenum .  . kidney stones     cysto with litholapexy  . POLYPECTOMY  07/29/2017    Procedure: POLYPECTOMY;  Surgeon: RDaneil Dolin MD;  Location: [facility];  Service: Endoscopy;;  colon    Social History   Socioeconomic History  . Marital status: Divorced    Spouse name: Not on file  . Number of children: 0  . Years of education: 125 . Highest education level: Not on file  Occupational History  . Occupation: DISABLED    Employer: UNEMPLOYED  Social Needs  . Financial resource strain: Not on file  . Food insecurity:    Worry: Not on file    Inability: Not on file  . Transportation needs:    Medical: Not on file    Non-medical: Not on file  Tobacco Use  . Smoking status: Former Smoker    Packs/day: 1.50    Years: 25.00    Pack years: 37.50    Types: Cigarettes    Last attempt to quit: 02/20/2002    Years since quitting: 15.6  . Smokeless tobacco: Former [employer] developer   Types: Snuff    Quit date: 11/02/1980  . Tobacco comment: Quit x 15 years  Substance and Sexual Activity  . Alcohol use: No  . Drug use: No  . Sexual activity: Not Currently    Birth control/protection: None  Lifestyle  . Physical activity:    Days per week: Not on file    Minutes per session: Not on file  . Stress: Not on file  Relationships  . Social connections:    Talks on phone: Not on file  Gets together: Not on file    Attends religious service: Not on file    Active member of club or organization: Not on file    Attends meetings of clubs or organizations: Not on file    Relationship status: Not on file  Other Topics Concern  . Not on file  Social History Narrative  . Not on file   Outpatient Encounter Medications as of 09/23/2017  Medication Sig  . ANDROGEL PUMP 20.25 MG/ACT (1.62%) GEL Apply 2 application topically daily at 2 PM. Apply 1 pump to each shoulder daily in the afternoon  . aspirin EC 81 MG tablet Take 81 mg by mouth every evening.   . brimonidine-timolol (COMBIGAN) 0.2-0.5 % ophthalmic solution Place 1 drop into both eyes 2 (two) times daily.   .  citalopram (CELEXA) 40 MG tablet TAKE 1 TABLET BY MOUTH ONCE A DAY.  Marland Kitchen Continuous Blood Gluc Receiver (FREESTYLE LIBRE 14 DAY READER) DEVI 1 each by Does not apply route once for 1 dose.  . Continuous Blood Gluc Sensor (FREESTYLE LIBRE 14 DAY SENSOR) MISC Inject 1 each into the skin every 14 (fourteen) days. Use as directed.  . cyclobenzaprine (FLEXERIL) 10 MG tablet TAKE (1) TABLET BY MOUTH EVERY EIGHT HOURS AS NEEDED FOR MUSCLE SPASMS.  Marland Kitchen gabapentin (NEURONTIN) 600 MG tablet TAKE ONE TABLET BY MOUTH 3 TIMES DAILY.  Marland Kitchen HUMULIN R U-500 KWIKPEN 500 UNIT/ML kwikpen INJECT UP TO 100 UNITS SUBCUTANEOUSLY 30 MINUTES BEFORE MEALS. (Patient taking differently: INJECT UP TO 70 UNITS SUBCUTANEOUSLY 30 MINUTES BEFORE MEALS.)  . hydrochlorothiazide (HYDRODIURIL) 25 MG tablet Take 1 tablet (25 mg total) by mouth daily. (Patient taking differently: Take 12.5 mg by mouth daily. )  . ketoconazole (NIZORAL) 2 % shampoo APPLY TOPICALLY TWICE WEEKLY AS DIRECTED. (Patient taking differently: APPLY TOPICALLY TWICE WEEKLY AS NEEDED FOR DRY/ITCHY/FLAKY SCALP.)  . lamoTRIgine (LAMICTAL) 200 MG tablet TAKE 1 TABLET BY MOUTH ONCE A DAY.  Marland Kitchen levothyroxine (SYNTHROID, LEVOTHROID) 88 MCG tablet TAKE ONE TABLET BY MOUTH DAILY BEFORE BREAKFAST.  Marland Kitchen lisinopril (PRINIVIL,ZESTRIL) 5 MG tablet TAKE 1 TABLET BY MOUTH ONCE A DAY. (Patient taking differently: TAKE 1 TABLET BY MOUTH ONCE A DAY AT NIGHT)  . lisinopril (PRINIVIL,ZESTRIL) 5 MG tablet TAKE 1 TABLET BY MOUTH ONCE A DAY.  . metoprolol succinate (TOPROL-XL) 25 MG 24 hr tablet TAKE (1) TABLET BY MOUTH ONCE DAILY.  . Na Sulfate-K Sulfate-Mg Sulf 17.5-3.13-1.6 GM/177ML SOLN Take 1 kit by mouth as directed.  . naproxen (NAPROSYN) 500 MG tablet TAKE ONE TABLET BY MOUTH TWICE DAILY WITH A MEAL. (Patient taking differently: TAKE ONE TABLET BY MOUTH TWICE DAILY AS NEEDED FOR PAIN.)  . NOVOFINE 30G X 8 MM MISC   . pantoprazole (PROTONIX) 40 MG tablet TAKE (1) TABLET BY MOUTH TWICE DAILY.   . simvastatin (ZOCOR) 40 MG tablet TAKE 1 TABLET BY MOUTH ONCE A DAY. (Patient taking differently: TAKE 1 TABLET BY MOUTH ONCE A DAY IN THE AFTERNOON.)  . SURE COMFORT PEN NEEDLES 31G X 8 MM MISC USE TWICE DAILY WITH BYETTA  . traMADol (ULTRAM) 50 MG tablet TAKE 2 TABLETS BY MOUTH EVERY 8 HOURS AS NEEDED. (Patient taking differently: Take 50-100 mg by mouth 3 (three) times daily as needed (FOR PAIN.). TAKE 2 TABLETS BY MOUTH EVERY 8 HOURS AS NEEDED.)  . TRAVATAN Z 0.004 % SOLN ophthalmic solution Place 1 drop into both eyes at bedtime.   . triamcinolone ointment (KENALOG) 0.1 % APPLY TO AFFECTED AREAS TWICE DAILY. (Patient taking  differently: APPLY TO AFFECTED AREAS TWICE DAILY AS NEEDED FOR DRY/ITCHY SKIN.)  . VICTOZA 18 MG/3ML SOPN INJECT 1.8 MG SUBCUTANEOUSLY ONCE DAILY. (Patient taking differently: INJECT 1.8 MG SUBCUTANEOUSLY ONCE DAILY AT NIGHT.)  . Vitamin D, Ergocalciferol, (DRISDOL) 50000 units CAPS capsule TAKE ONE CAPSULE BY MOUTH ONCE WEEKLY. (Patient taking differently: TAKE ONE CAPSULE BY MOUTH ONCE WEEKLY ON SUNDAYS.)  . [DISCONTINUED] hydrochlorothiazide (HYDRODIURIL) 25 MG tablet TAKE 1 TABLET BY MOUTH ONCE A DAY.   Facility-Administered Encounter Medications as of 09/23/2017  Medication  . triamcinolone acetonide (KENALOG-40) injection 20 mg   ALLERGIES: Allergies  Allergen Reactions  . No Allergies On File   . No Known Allergies    VACCINATION STATUS: Immunization History  Administered Date(s) Administered  . Influenza Whole 04/01/2010  . Influenza,inj,Quad PF,6+ Mos 03/08/2013  . Pneumococcal Conjugate-13 07/09/2014  . Pneumococcal Polysaccharide-23 04/01/2010, 10/07/2015  . Tdap 11/24/2011  . Zoster 12/12/2014    Diabetes  He presents for his follow-up diabetic visit. He has type 2 diabetes mellitus. His disease course has been improving. There are no hypoglycemic associated symptoms. Pertinent negatives for hypoglycemia include no confusion, headaches, pallor or  seizures. There are no diabetic associated symptoms. Pertinent negatives for diabetes include no chest pain, no fatigue, no polydipsia, no polyphagia, no polyuria and no weakness. There are no hypoglycemic complications. Symptoms are improving. Diabetic complications include impotence and peripheral neuropathy. Risk factors for coronary artery disease include diabetes mellitus, dyslipidemia, family history, hypertension, male sex, obesity, sedentary lifestyle and tobacco exposure. He is compliant with treatment most of the time. His weight is decreasing steadily. He is following a generally unhealthy diet. He rarely participates in exercise. His home blood glucose trend is decreasing steadily. His breakfast blood glucose range is generally 140-180 mg/dl. His lunch blood glucose range is generally 180-200 mg/dl. His dinner blood glucose range is generally 140-180 mg/dl. His overall blood glucose range is 140-180 mg/dl. An ACE inhibitor/angiotensin II receptor blocker is being taken. Eye exam is current.  Hyperlipidemia  This is a chronic problem. The current episode started more than 1 year ago. The problem is controlled. Exacerbating diseases include diabetes and obesity. Pertinent negatives include no chest pain, myalgias or shortness of breath. Current antihyperlipidemic treatment includes statins. Risk factors for coronary artery disease include a sedentary lifestyle, male sex, diabetes mellitus, dyslipidemia and obesity.  Hypertension  The current episode started more than 1 year ago. The problem is controlled. Pertinent negatives include no chest pain, headaches, neck pain, palpitations or shortness of breath. Risk factors for coronary artery disease include dyslipidemia, diabetes mellitus, obesity, sedentary lifestyle and smoking/tobacco exposure. Past treatments include ACE inhibitors.     Review of Systems  Constitutional: Negative for fatigue and unexpected weight change.  HENT: Negative for  dental problem, mouth sores and trouble swallowing.   Eyes: Negative for visual disturbance.  Respiratory: Negative for cough, choking, chest tightness, shortness of breath and wheezing.   Cardiovascular: Negative for chest pain, palpitations and leg swelling.  Gastrointestinal: Negative for abdominal distention, abdominal pain, constipation, diarrhea, nausea and vomiting.  Endocrine: Negative for polydipsia, polyphagia and polyuria.  Genitourinary: Positive for impotence. Negative for dysuria, flank pain, hematuria and urgency.  Musculoskeletal: Negative for back pain, gait problem, myalgias and neck pain.  Skin: Negative for pallor, rash and wound.  Neurological: Negative for seizures, syncope, weakness, numbness and headaches.  Psychiatric/Behavioral: Negative.  Negative for confusion and dysphoric mood.    Objective:    BP 135/77   Pulse 66  Ht 6' (1.829 m)   Wt (!) 448 lb (203.2 kg)   BMI 60.76 kg/m   Wt Readings from Last 3 Encounters:  09/23/17 (!) 448 lb (203.2 kg)  07/23/17 (!) 461 lb (209.1 kg)  07/19/17 (!) 468 lb (212.3 kg)    Physical Exam  Constitutional: He is oriented to person, place, and time. He appears well-developed. He is cooperative. No distress.  HENT:  Head: Normocephalic and atraumatic.  Eyes: EOM are normal.  Neck: Normal range of motion. Neck supple. No tracheal deviation present. No thyromegaly present.  Cardiovascular: Normal rate, S1 normal and S2 normal. Exam reveals no gallop.  No murmur heard. Pulses:      Dorsalis pedis pulses are 1+ on the right side, and 1+ on the left side.       Posterior tibial pulses are 1+ on the right side, and 1+ on the left side.  Pulmonary/Chest: Effort normal and breath sounds normal. No respiratory distress. He has no wheezes.  Abdominal: He exhibits no distension. There is no tenderness. There is no guarding and no CVA tenderness.  + Obese.  Musculoskeletal: He exhibits no edema.       Right shoulder: He  exhibits no swelling and no deformity.  Large lower extremities with skin dryness and lateral eczema on the left lower leg.  Neurological: He is alert and oriented to person, place, and time. He has normal strength and normal reflexes. No cranial nerve deficit or sensory deficit. Gait normal.  Skin: Skin is warm and dry. No rash noted. No cyanosis. Nails show no clubbing.  He has extensive tattoos.  Psychiatric: He has a normal mood and affect. His speech is normal. Cognition and memory are normal.    Results for orders placed or performed during the hospital encounter of 07/29/17  Glucose, capillary  Result Value Ref Range   Glucose-Capillary 155 (H) 65 - 99 mg/dL  Glucose, capillary  Result Value Ref Range   Glucose-Capillary 169 (H) 65 - 99 mg/dL  Diabetic Labs (most recent): Lab Results  Component Value Date   HGBA1C 7.6 (H) 09/14/2017   HGBA1C 8.0 (H) 05/26/2017   HGBA1C 8.0 (H) 02/22/2017   Lipid Panel     Component Value Date/Time   CHOL 164 06/01/2017 0759   TRIG 181 (H) 06/01/2017 0759   HDL 50 06/01/2017 0759   CHOLHDL 3.3 06/01/2017 0759   VLDL 35 (H) 08/11/2016 0748   LDLCALC 86 06/01/2017 0759    Assessment & Plan:   1. Type 2 diabetes mellitus with pressure callus ([facility])  His diabetes is  complicated by PAD, peripheral neuropathy, with hx of diabetic foot ulcer.  Patient came with Improved glycemic profile on U500 insulin taking 80 units 3 times a day before meals. He came with improved blood glucose profile and A1c at 7.6%.  He has slightly above target readings before lunch.  No documented nor reported hypoglycemia.  - Patient remains at a high risk for more acute and chronic complications of diabetes which include CAD, CVA, CKD, retinopathy, and neuropathy. These are all discussed in detail with the patient.  - I have re-counseled the patient on diet management and weight loss  by adopting a carbohydrate restricted / protein rich  Diet. - Patient is  advised to stick to a routine mealtimes to eat 3 meals  a day and avoid unnecessary snacks ( to snack only to correct hypoglycemia).  -  Suggestion is made for him to avoid simple carbohydrates  from  his diet including Cakes, Sweet Desserts / Pastries, Ice Cream, Soda (diet and regular), Sweet Tea, Candies, Chips, Cookies, Store Bought Juices, Alcohol in Excess of  1-2 drinks a day, Artificial Sweeteners, and "Sugar-free" Products. This will help patient to have stable blood glucose profile and potentially avoid unintended weight gain.    - I have approached patient with the following individualized plan to manage diabetes and patient agrees.  - I will increase his Humulin U500   to 80 units before breakfast, 70 units before lunch, and 70 units before supper  for pre-meal blood glucose readings above 90 mg/dL associated with strict monitoring of glucose 4 times a day-before meals and at bedtime. - He will benefit from CGM.  I discussed and initiated a prescription for the [facility] device for him.   - Patient is warned not to take insulin without proper monitoring per orders.  -Patient is encouraged to call clinic for blood glucose levels less than 70 or above 300 mg /dl. - I advised him to continue Victoza 1.8 mg subcutaneous daily, therapeutically suitable for patient. -Patient does not tolerate metformin. - Patient specific target  for A1c; LDL, HDL, Triglycerides, and  Waist Circumference were discussed in detail.  2) BP/HTN: His blood pressure is controlled to target.  I have advised him to  continue his current blood pressure medications including ACEI. 3) Lipids/HPL: His lipid profile is controlled LDL at 82, I have advised him to continue statins. 4)  Weight/Diet: CDE consult has been initiated, exercise, and carbohydrates information provided. He recently lost 37 pounds mainly due to gastroenteritis.  5) hypothyroidism:   - His thyroid function tests are consistent with  appropriate replacement. I advised him to continue levothyroxine 88 g by mouth every morning.  - We discussed about correct intake of levothyroxine, at fasting, with water, separated by at least 30 minutes from breakfast, and separated by more than 4 hours from calcium, iron, multivitamins, acid reflux medications (PPIs). -Patient is made aware of the fact that thyroid hormone replacement is needed for life, dose to be adjusted by periodic monitoring of thyroid function tests.  6) Chronic Care/Health Maintenance:  -Patient is on ACEI and Statin medications and encouraged to continue to follow up with Ophthalmology, Podiatrist at least yearly or according to recommendations, and advised to  stay away from smoking. I have recommended yearly flu vaccine and pneumonia vaccination at least every 5 years; moderate intensity exercise for up to 150 minutes weekly; and  sleep for at least 7 hours a day.  I advised patient to maintain close follow up with his PCP for primary care needs.  - Time spent with the patient: 25 min, of which >50% was spent in reviewing his blood glucose logs , discussing his hypo- and hyper-glycemic episodes, reviewing his current and  previous labs and insulin doses and developing a plan to avoid hypo- and hyper-glycemia. Please refer to Patient Instructions for Blood Glucose Monitoring and Insulin/Medications Dosing Guide"  in media tab for additional information. Nicholas [name] participated in the discussions, expressed understanding, and voiced agreement with the above plans.  All questions were answered to his satisfaction. he is encouraged to contact clinic should he have any questions or concerns prior to his return visit.  Follow up plan: Return in about 4 months (around 01/23/2018) for meter, and logs.  Glade Lloyd, MD Phone: [phone]  Fax: [phone]  This note was partially dictated with voice recognition software. Similar sounding words can be transcribed  inadequately or may not  be corrected upon review.  09/23/2017, 12:48 PM

## 2017-09-23 NOTE — Patient Instructions (Signed)

## 2017-09-27 DIAGNOSIS — H5210 Myopia, unspecified eye: Secondary | ICD-10-CM | POA: Diagnosis not present

## 2017-09-27 DIAGNOSIS — E109 Type 1 diabetes mellitus without complications: Secondary | ICD-10-CM | POA: Diagnosis not present

## 2017-09-27 DIAGNOSIS — I10 Essential (primary) hypertension: Secondary | ICD-10-CM | POA: Diagnosis not present

## 2017-10-02 ENCOUNTER — Other Ambulatory Visit: Payer: Self-pay | Admitting: Family Medicine

## 2017-10-05 ENCOUNTER — Other Ambulatory Visit: Payer: Self-pay | Admitting: "Endocrinology

## 2017-10-09 ENCOUNTER — Other Ambulatory Visit: Payer: Self-pay | Admitting: Family Medicine

## 2017-10-12 ENCOUNTER — Encounter: Payer: Self-pay | Admitting: Sports Medicine

## 2017-10-12 ENCOUNTER — Ambulatory Visit: Payer: Medicare HMO | Admitting: Sports Medicine

## 2017-10-12 DIAGNOSIS — B351 Tinea unguium: Secondary | ICD-10-CM | POA: Diagnosis not present

## 2017-10-12 DIAGNOSIS — E1149 Type 2 diabetes mellitus with other diabetic neurological complication: Secondary | ICD-10-CM | POA: Diagnosis not present

## 2017-10-12 DIAGNOSIS — I739 Peripheral vascular disease, unspecified: Secondary | ICD-10-CM

## 2017-10-12 DIAGNOSIS — L97511 Non-pressure chronic ulcer of other part of right foot limited to breakdown of skin: Secondary | ICD-10-CM | POA: Diagnosis not present

## 2017-10-12 DIAGNOSIS — S91209A Unspecified open wound of unspecified toe(s) with damage to nail, initial encounter: Secondary | ICD-10-CM

## 2017-10-12 LAB — HM DIABETES EYE EXAM

## 2017-10-12 NOTE — Progress Notes (Signed)
Patient ID: Nicholas [name], male   DOB: 06/28/1956, 62 y.o.   MRN: [id number]  Subjective: Nicholas [name] is a 62 y.o. Diabetic male patient seen in office for follow up of wound at right 1st toe, states that it has been doing good, has been applying betadine to area when he can wrap it; no longer has girlfriend to help him. Admits that he snagged his left 1st toenail with dry blood. Reports he wrapped the toe and put neosporin to area. Denies any other symptoms.    Patient Active Problem List   Diagnosis Date Noted  . Type 2 diabetes mellitus with diabetic nephropathy, without long-term current use of insulin ([place]) 06/23/2017  . Neck pain on left side 04/10/2017  . Dental caries 04/10/2017  . Hypothyroidism 08/19/2016  . GERD (gastroesophageal reflux disease) 04/14/2016  . Charcot foot due to diabetes mellitus ([place]) 01/28/2016  . Benign essential tremor 01/28/2016  . Skin lesion of back 10/07/2015  . Back pain with left-sided radiculopathy 06/05/2015  . Hyperlipidemia 04/10/2015  . Abdominal wall bulge 01/15/2015  . Hx of adenomatous colonic polyps 02/21/2014  . Allergic rhinitis 07/24/2013  . Onychomycosis 05/21/2013  . Type 2 diabetes mellitus with pressure callus ([place]) 05/21/2013  . Back pain with radiation 04/20/2011  . Seborrheic dermatitis 11/20/2010  . Dermatomycosis 11/20/2010  . FATIGUE 04/01/2010  . CARPAL TUNNEL SYNDROME, BILATERAL 03/24/2010  . Hypogonadism in male 01/27/2010  . DEPRESSION/ANXIETY 01/27/2010  . CHF 01/13/2010  . DYSPNEA 01/13/2010  . Diabetes mellitus type 2 in obese ([place]) 12/12/2009  . MORBID OBESITY 12/12/2009  . Benign hypertension 12/12/2009  . PVD (peripheral vascular disease) ([place]) 12/12/2009   Current Outpatient Medications on File Prior to Visit  Medication Sig Dispense Refill  . ANDROGEL PUMP 20.25 MG/ACT (1.62%) GEL Apply 2 application topically daily at 2 PM. Apply 1 pump to each shoulder daily in the afternoon    . aspirin EC 81  MG tablet Take 81 mg by mouth every evening.     . brimonidine-timolol (COMBIGAN) 0.2-0.5 % ophthalmic solution Place 1 drop into both eyes 2 (two) times daily.     . citalopram (CELEXA) 40 MG tablet TAKE 1 TABLET BY MOUTH ONCE A DAY. 90 tablet 0  . Continuous Blood Gluc Sensor (FREESTYLE LIBRE 14 DAY SENSOR) MISC Inject 1 each into the skin every 14 (fourteen) days. Use as directed. 2 each 2  . cyclobenzaprine (FLEXERIL) 10 MG tablet TAKE (1) TABLET BY MOUTH EVERY EIGHT HOURS AS NEEDED FOR MUSCLE SPASMS. 30 tablet 0  . gabapentin (NEURONTIN) 600 MG tablet TAKE ONE TABLET BY MOUTH 3 TIMES DAILY. 270 tablet 0  . HUMULIN R U-500 KWIKPEN 500 UNIT/ML kwikpen INJECT UP TO 100 UNITS SUBCUTANEOUSLY 30 MINUTES BEFORE MEALS. (Patient taking differently: INJECT UP TO 70 UNITS SUBCUTANEOUSLY 30 MINUTES BEFORE MEALS.) 18 mL 0  . hydrochlorothiazide (HYDRODIURIL) 25 MG tablet Take 1 tablet (25 mg total) by mouth daily. (Patient taking differently: Take 12.5 mg by mouth daily. ) 30 tablet 1  . Insulin Pen Needle (SURE COMFORT PEN NEEDLES) 31G X 8 MM MISC Inject 1 each into the skin 4 (four) times daily. 200 each 2  . ketoconazole (NIZORAL) 2 % shampoo APPLY TOPICALLY TWICE WEEKLY AS DIRECTED. (Patient taking differently: APPLY TOPICALLY TWICE WEEKLY AS NEEDED FOR DRY/ITCHY/FLAKY SCALP.) 120 mL 0  . lamoTRIgine (LAMICTAL) 200 MG tablet TAKE 1 TABLET BY MOUTH ONCE A DAY. 30 tablet 5  . levothyroxine (SYNTHROID, LEVOTHROID) 88 MCG tablet TAKE  ONE TABLET BY MOUTH DAILY BEFORE BREAKFAST. 30 tablet 3  . lisinopril (PRINIVIL,ZESTRIL) 5 MG tablet TAKE 1 TABLET BY MOUTH ONCE A DAY. (Patient taking differently: TAKE 1 TABLET BY MOUTH ONCE A DAY AT NIGHT) 30 tablet 0  . lisinopril (PRINIVIL,ZESTRIL) 5 MG tablet TAKE 1 TABLET BY MOUTH ONCE A DAY. 30 tablet 5  . metoprolol succinate (TOPROL-XL) 25 MG 24 hr tablet TAKE (1) TABLET BY MOUTH ONCE DAILY. 90 tablet 1  . Na Sulfate-K Sulfate-Mg Sulf 17.5-3.13-1.6 GM/177ML SOLN Take 1  kit by mouth as directed. 1 Bottle 0  . naproxen (NAPROSYN) 500 MG tablet TAKE ONE TABLET BY MOUTH TWICE DAILY WITH A MEAL. (Patient taking differently: TAKE ONE TABLET BY MOUTH TWICE DAILY AS NEEDED FOR PAIN.) 60 tablet 5  . NOVOFINE 30G X 8 MM MISC     . pantoprazole (PROTONIX) 40 MG tablet TAKE (1) TABLET BY MOUTH TWICE DAILY. 180 tablet 3  . simvastatin (ZOCOR) 40 MG tablet TAKE 1 TABLET BY MOUTH ONCE A DAY. 90 tablet 0  . traMADol (ULTRAM) 50 MG tablet TAKE 2 TABLETS BY MOUTH EVERY 8 HOURS AS NEEDED. (Patient taking differently: Take 50-100 mg by mouth 3 (three) times daily as needed (FOR PAIN.). TAKE 2 TABLETS BY MOUTH EVERY 8 HOURS AS NEEDED.) 180 tablet 0  . TRAVATAN Z 0.004 % SOLN ophthalmic solution Place 1 drop into both eyes at bedtime.     . triamcinolone ointment (KENALOG) 0.1 % APPLY TO AFFECTED AREAS TWICE DAILY. (Patient taking differently: APPLY TO AFFECTED AREAS TWICE DAILY AS NEEDED FOR DRY/ITCHY SKIN.) 454 g 0  . VICTOZA 18 MG/3ML SOPN INJECT 1.8 MG SUBCUTANEOUSLY ONCE DAILY. (Patient taking differently: INJECT 1.8 MG SUBCUTANEOUSLY ONCE DAILY AT NIGHT.) 9 mL 2  . Vitamin D, Ergocalciferol, (DRISDOL) 50000 units CAPS capsule TAKE ONE CAPSULE BY MOUTH ONCE WEEKLY. (Patient taking differently: TAKE ONE CAPSULE BY MOUTH ONCE WEEKLY ON SUNDAYS.) 12 capsule 0   Current Facility-Administered Medications on File Prior to Visit  Medication Dose Route Frequency Provider Last Rate Last Dose  . triamcinolone acetonide (KENALOG-40) injection 20 mg  20 mg Other Once Landis Martins, DPM       Allergies  Allergen Reactions  . No Allergies On File   . No Known Allergies     Recent Results (from the past 2160 hour(s))  CBC     Status: Abnormal   Collection Time: 07/23/17 10:26 AM  Result Value Ref Range   WBC 11.1 (H) 4.0 - 10.5 K/uL   RBC 5.02 4.22 - 5.81 MIL/uL   Hemoglobin 13.7 13.0 - 17.0 g/dL   HCT 44.8 39.0 - 52.0 %   MCV 89.2 78.0 - 100.0 fL   MCH 27.3 26.0 - 34.0 pg   MCHC  30.6 30.0 - 36.0 g/dL   RDW 15.8 (H) 11.5 - 15.5 %   Platelets 229 150 - 400 K/uL  Basic metabolic panel     Status: Abnormal   Collection Time: 07/23/17 10:26 AM  Result Value Ref Range   Sodium 140 135 - 145 mmol/L   Potassium 4.5 3.5 - 5.1 mmol/L   Chloride 102 101 - 111 mmol/L   CO2 27 22 - 32 mmol/L   Glucose, Bld 184 (H) 65 - 99 mg/dL   BUN 21 (H) 6 - 20 mg/dL   Creatinine, Ser 1.13 0.61 - 1.24 mg/dL   Calcium 9.5 8.9 - 10.3 mg/dL   GFR calc non Af Amer >60 >60 mL/min   GFR  calc Af Amer >60 >60 mL/min    Comment: (NOTE) The eGFR has been calculated using the CKD EPI equation. This calculation has not been validated in all clinical situations. eGFR's persistently <60 mL/min signify possible Chronic Kidney Disease.    Anion gap 11 5 - 15  Glucose, capillary     Status: Abnormal   Collection Time: 07/29/17 10:45 AM  Result Value Ref Range   Glucose-Capillary 155 (H) 65 - 99 mg/dL  Glucose, capillary     Status: Abnormal   Collection Time: 07/29/17 12:25 PM  Result Value Ref Range   Glucose-Capillary 169 (H) 65 - 99 mg/dL  COMPLETE METABOLIC PANEL WITH GFR     Status: Abnormal   Collection Time: 09/14/17 12:44 PM  Result Value Ref Range   Glucose, Bld 182 (H) 65 - 139 mg/dL    Comment: .        Non-fasting reference interval .    BUN 16 7 - 25 mg/dL   Creat 1.14 0.70 - 1.25 mg/dL    Comment: For patients >35 years of age, the reference limit for Creatinine is approximately 13% higher for people identified as African-American. .    GFR, Est Non African American 69 > OR = 60 mL/min/1.38m   GFR, Est African American 79 > OR = 60 mL/min/1.756m  BUN/Creatinine Ratio NOT APPLICABLE 6 - 22 (calc)   Sodium 142 135 - 146 mmol/L   Potassium 5.4 (H) 3.5 - 5.3 mmol/L   Chloride 103 98 - 110 mmol/L   CO2 33 (H) 20 - 32 mmol/L   Calcium 9.7 8.6 - 10.3 mg/dL   Total Protein 6.3 6.1 - 8.1 g/dL   Albumin 4.0 3.6 - 5.1 g/dL   Globulin 2.3 1.9 - 3.7 g/dL (calc)   AG Ratio  1.7 1.0 - 2.5 (calc)   Total Bilirubin 0.4 0.2 - 1.2 mg/dL   Alkaline phosphatase (APISO) 79 40 - 115 U/L   AST 20 10 - 35 U/L   ALT 18 9 - 46 U/L  Hemoglobin A1c     Status: Abnormal   Collection Time: 09/14/17 12:44 PM  Result Value Ref Range   Hgb A1c MFr Bld 7.6 (H) <5.7 % of total Hgb    Comment: For someone without known diabetes, a hemoglobin A1c value of 6.5% or greater indicates that they may have  diabetes and this should be confirmed with a follow-up  test. . For someone with known diabetes, a value <7% indicates  that their diabetes is well controlled and a value  greater than or equal to 7% indicates suboptimal  control. A1c targets should be individualized based on  duration of diabetes, age, comorbid conditions, and  other considerations. . Currently, no consensus exists regarding use of hemoglobin A1c for diagnosis of diabetes for children. .    Mean Plasma Glucose 171 (calc)   eAG (mmol/L) 9.5 (calc)    Objective: There were no vitals filed for this visit.  General: Patient is awake, alert, oriented x 3 and in no acute distress.  Dermatology: Skin is warm and dry bilateral with a partial thickness ulceration measures 0.3x0.4x0.1cm with granular base at right hallux with surrounding reactive keratosis no signs of acute infection at site. Nails are short and thick with no signs of ingrowing or infection however there is dry blood and partial detachment of left 1st toenail. Dry skin on left foot with no signs of infection.    Vascular: Dorsalis Pedis pulse = 0/4 Bilateral,  Posterior Tibial pulse = 1/4 Bilateral faint,  Capillary Fill Time < 5 seconds, + chronic venous skin changes bilateral.  Neurologic:Protective sensation diminished using the 5.07/10g Semmes Weinstein Monofilament. Vibratory diminished bilateral.   Musculosketal:  No pain to left 1st toe. No pain to plantar medial and dorsal midfoot on left with accompanying prominent bones supportive of  Charcot. No Pain with palpation to healed ulcerated area at right hallux. + Hallux interphalangeus R>L hallux, No pain with compression to calves bilateral.   Assessment and Plan:  Problem List Items Addressed This Visit      Cardiovascular and Mediastinum   PVD (peripheral vascular disease) ([place])    Other Visit Diagnoses    Toe ulcer, right, limited to breakdown of skin ([place])    -  Primary   Traumatic avulsion of nail plate of toe, initial encounter       Type II diabetes mellitus with neurological manifestations ([place])         -Examined patient  -Mechanically debrided left 1st toenail removing loose great toenail using sterile nail nipper and debrided ulceration at right 1st toe using sterile chisel blade and dressed with betadine and coban dressing on right and antibiotic cream on left. Advised patient to do the same until healed -Continue with offloading pad to right insole  -Continue with gabapentin  -Continue with diabetic shoes  -Recommend lamisil spray to left foot or skin emollient as instructed  -Patient to return to office in 4-6 weeks for wound check on right and left 1st toenail bed check or sooner if problems arise.  Landis Martins, DPM

## 2017-10-13 DIAGNOSIS — E291 Testicular hypofunction: Secondary | ICD-10-CM | POA: Diagnosis not present

## 2017-10-18 ENCOUNTER — Other Ambulatory Visit: Payer: Self-pay | Admitting: Family Medicine

## 2017-10-18 ENCOUNTER — Other Ambulatory Visit: Payer: Self-pay | Admitting: "Endocrinology

## 2017-10-18 ENCOUNTER — Ambulatory Visit (INDEPENDENT_AMBULATORY_CARE_PROVIDER_SITE_OTHER): Payer: Medicare HMO | Admitting: Family Medicine

## 2017-10-18 ENCOUNTER — Encounter: Payer: Self-pay | Admitting: Family Medicine

## 2017-10-18 VITALS — BP 140/80 | HR 87 | Resp 16 | Ht 69.0 in | Wt >= 6400 oz

## 2017-10-18 DIAGNOSIS — G4452 New daily persistent headache (NDPH): Secondary | ICD-10-CM

## 2017-10-18 DIAGNOSIS — I10 Essential (primary) hypertension: Secondary | ICD-10-CM | POA: Diagnosis not present

## 2017-10-18 DIAGNOSIS — F321 Major depressive disorder, single episode, moderate: Secondary | ICD-10-CM

## 2017-10-18 DIAGNOSIS — F341 Dysthymic disorder: Secondary | ICD-10-CM | POA: Diagnosis not present

## 2017-10-18 DIAGNOSIS — E1121 Type 2 diabetes mellitus with diabetic nephropathy: Secondary | ICD-10-CM

## 2017-10-18 DIAGNOSIS — R69 Illness, unspecified: Secondary | ICD-10-CM | POA: Diagnosis not present

## 2017-10-18 MED ORDER — FLUOXETINE HCL 20 MG PO TABS: 20.0000 mg | ORAL_TABLET | Freq: Every day | ORAL | 3 refills | Status: DC

## 2017-10-18 NOTE — Patient Instructions (Addendum)
F/u in end June  Start daily fluoxetine  Yoyu are referred to psychotherapiust ava she will call you  You are referred for head CT scan we will call with appt info  Please DO love yourself

## 2017-10-22 ENCOUNTER — Encounter: Payer: Self-pay | Admitting: Family Medicine

## 2017-10-22 DIAGNOSIS — F321 Major depressive disorder, single episode, moderate: Secondary | ICD-10-CM | POA: Insufficient documentation

## 2017-10-22 DIAGNOSIS — R51 Headache: Secondary | ICD-10-CM

## 2017-10-22 DIAGNOSIS — R519 Headache, unspecified: Secondary | ICD-10-CM | POA: Insufficient documentation

## 2017-10-22 NOTE — Assessment & Plan Note (Signed)
Patient is not suicidal or homicidal. Will start fluoxetine 20 mg daily. Trigger is breakup of a relationship, he has lost the love of his life to another man, and his ex is living in the same complex that he currently is in , with the new partner. He desires psychotherapy and I will refer him. He reports recently returning to [place] and resuming water aerobic , both of which are good

## 2017-10-22 NOTE — Assessment & Plan Note (Signed)
Improved, pt applauded on this and encouraged to continue. Patient re-educated about  the importance of commitment to a  minimum of 150 minutes of exercise per week.  The importance of healthy food choices with portion control discussed. Encouraged to start a food diary, count calories and to consider  joining a support group. Sample diet sheets offered. Goals set by the patient for the next several months.   Weight /BMI 10/18/2017 09/23/2017 07/23/2017  WEIGHT 441 lb 448 lb 461 lb  HEIGHT 5\' 9"  6\' 0"  6\' 0"   BMI 65.12 kg/m2 60.76 kg/m2 62.52 kg/m2

## 2017-10-22 NOTE — Assessment & Plan Note (Signed)
Uncontrolled at visiit No med change  As generally wel controled, states he did not take medication on day of visit, stressed DASH diet and commitment to daily physical activity for a minimum of 30 minutes discussed and encouraged, as a part of hypertension management. The importance of attaining a healthy weight is also discussed.  BP/Weight 10/18/2017 09/23/2017 07/29/2017 07/23/2017 07/19/2017 06/23/2017 29/19/1660  Systolic BP 600 459 977 414 239 532 023  Diastolic BP 80 77 41 67 70 74 64  Wt. (Lbs) 441 448 - 461 468 438 426.2  BMI 65.12 60.76 - 62.52 63.47 59.4 57.8

## 2017-10-22 NOTE — Progress Notes (Signed)
Nicholas [name]     MRN: [id number]      DOB: Nov 23, 1955   HPI Mr. Schreier is here with a 2 month h/o daily generalized headache rated at a 10 disrupting his life. This is new, he thinks it is likely related to stress as the love of his life has left him for a new partner and they both live in the same complex thats he does and he sees them daily , and she continues to "playhim" He is very depressed and stressed about the situation , but is neither suicidal nor homicidal . He reports poor sleep and chronic fatigue. He deinies any new weakness or numbness, he is however very concerned about the pain in his head. He denies any vision change Blood sugar has deteriorated, has increased as has his weight , though he says he has recently got clearance to return to the pool to start water aerobics as his leg ulcers are healing, he has also recently started going back  To [place] , so he is hoping for better days in the near future. He is very interested in psychotherapy, he has taken antidepressants in the past and is willing to resume. ROS Denies recent fever or chills. Denies sinus pressure, nasal congestion, ear pain or sore throat. Denies chest congestion, productive cough or wheezing. Denies chest pains, palpitations and leg swelling Denies abdominal pain, nausea, vomiting,diarrhea or constipation.   Denies dysuria and  Frequency, c/o chronic back pain  and limitation in mobility. C/o headache and c/o lower extremity numbness and tingling. C/o chronic lower extremity ulcers  C/o depression, anxiety and insomnia   PE  BP 140/80   Pulse 87   Resp 16   Ht 5\' 9"  (1.753 m)   Wt (!) 441 lb (200 kg)   SpO2 91%   BMI 65.12 kg/m   Patient alert and oriented and in moderate cardiopulmonary distress.with ambulation,   HEENT: No facial asymmetry, EOMI,   oropharynx pink and moist.  Neck supple no JVD, no mass.  Chest: Clear to auscultation bilaterally.decreased air entry CVS: S1, S2 no  murmurs, no S3.Regular rate.  ABD: Soft non tender.   Ext: No edema  MS: decreased ROM spine, shoulders, hips and knees.  Skin: lower extremity ulcerations .  Tearful at timesMemory intact not anxious  depressed appearing.  CNS: CN 2-12 intact, power,  normal throughout.no focal deficits noted.   Assessment & Plan  Headache 2 month h/o daily disabling headache. No neurologic changes, associated with new stress and depression, as well as poor sleep because of severity and the fact that the headache is new , imaging study is ordered , pt is also very concerned  Neuro exam is normal  Depression, major, single episode, moderate ([place]) Patient is not suicidal or homicidal. Will start fluoxetine 20 mg daily. Trigger is breakup of a relationship, he has lost the love of his life to another man, and his ex is living in the same complex that he currently is in , with the new partner. He desires psychotherapy and I will refer him. He reports recently returning to [facility] and resuming water aerobic , both of which are good   MORBID OBESITY Improved, pt applauded on this and encouraged to continue. Patient re-educated about  the importance of commitment to a  minimum of 150 minutes of exercise per week.  The importance of healthy food choices with portion control discussed. Encouraged to start a food diary, count calories and to consider  joining a support group. Sample diet sheets offered. Goals set by the patient for the next several months.   Weight /BMI 10/18/2017 09/23/2017 07/23/2017  WEIGHT 441 lb 448 lb 461 lb  HEIGHT 5\' 9"  6\' 0"  6\' 0"   BMI 65.12 kg/m2 60.76 kg/m2 62.52 kg/m2      Type 2 diabetes mellitus with diabetic nephropathy, without long-term current use of insulin ([place]) Mr. Bonn is reminded of the importance of commitment to daily physical activity for 30 minutes or more, as able and the need to limit carbohydrate intake to 30 to 60 grams per meal to help with blood  sugar control.   The need to take medication as prescribed, test blood sugar as directed, and to call between visits if there is a concern that blood sugar is uncontrolled is also discussed.   Mr. Swinger is reminded of the importance of daily foot exam, annual eye examination, and good blood sugar, blood pressure and cholesterol control. Controlled, no change in medication Managed by endo  Diabetic Labs Latest Ref Rng & Units 09/14/2017 07/23/2017 06/01/2017 05/26/2017 02/22/2017  HbA1c <5.7 % of total Hgb 7.6(H) - - 8.0(H) 8.0(H)  Microalbumin <2.0 mg/dL - - - - -  Micro/Creat Ratio 0.0 - 30.0 mg/g - - - - -  Chol <200 mg/dL - - 164 - -  HDL >40 mg/dL - - 50 - -  Calc LDL mg/dL (calc) - - 86 - -  Triglycerides <150 mg/dL - - 181(H) - -  Creatinine 0.70 - 1.25 mg/dL 1.14 1.13 - 1.31(H) 1.25   BP/Weight 10/18/2017 09/23/2017 07/29/2017 07/23/2017 07/19/2017 06/23/2017 92/33/0076  Systolic BP 226 333 545 625 638 937 342  Diastolic BP 80 77 41 67 70 74 64  Wt. (Lbs) 441 448 - 461 468 438 426.2  BMI 65.12 60.76 - 62.52 63.47 59.4 57.8   Foot/eye exam completion dates Latest Ref Rng & Units 05/29/2016 02/14/2016  Eye Exam No Retinopathy No Retinopathy -  Foot exam Order - - -  Foot Form Completion - - Done        Essential hypertension Uncontrolled at visiit No med change  As generally wel controled, states he did not take medication on day of visit, stressed DASH diet and commitment to daily physical activity for a minimum of 30 minutes discussed and encouraged, as a part of hypertension management. The importance of attaining a healthy weight is also discussed.  BP/Weight 10/18/2017 09/23/2017 07/29/2017 07/23/2017 07/19/2017 06/23/2017 87/68/1157  Systolic BP 262 035 597 416 384 536 468  Diastolic BP 80 77 41 67 70 74 64  Wt. (Lbs) 441 448 - 461 468 438 426.2  BMI 65.12 60.76 - 62.52 63.47 59.4 57.8

## 2017-10-22 NOTE — Assessment & Plan Note (Signed)
2 month h/o daily disabling headache. No neurologic changes, associated with new stress and depression, as well as poor sleep because of severity and the fact that the headache is new , imaging study is ordered , pt is also very concerned  Neuro exam is normal

## 2017-10-22 NOTE — Assessment & Plan Note (Signed)
Mr. Nicholas [name] is reminded of the importance of commitment to daily physical activity for 30 minutes or more, as able and the need to limit carbohydrate intake to 30 to 60 grams per meal to help with blood sugar control.   The need to take medication as prescribed, test blood sugar as directed, and to call between visits if there is a concern that blood sugar is uncontrolled is also discussed.   Mr. Nicholas [name] is reminded of the importance of daily foot exam, annual eye examination, and good blood sugar, blood pressure and cholesterol control. Controlled, no change in medication Managed by endo  Diabetic Labs Latest Ref Rng & Units 09/14/2017 07/23/2017 06/01/2017 05/26/2017 02/22/2017  HbA1c <5.7 % of total Hgb 7.6(H) - - 8.0(H) 8.0(H)  Microalbumin <2.0 mg/dL - - - - -  Micro/Creat Ratio 0.0 - 30.0 mg/g - - - - -  Chol <200 mg/dL - - 164 - -  HDL >40 mg/dL - - 50 - -  Calc LDL mg/dL (calc) - - 86 - -  Triglycerides <150 mg/dL - - 181(H) - -  Creatinine 0.70 - 1.25 mg/dL 1.14 1.13 - 1.31(H) 1.25   BP/Weight 10/18/2017 09/23/2017 07/29/2017 07/23/2017 07/19/2017 06/23/2017 07/86/7544  Systolic BP 920 100 712 197 588 325 498  Diastolic BP 80 77 41 67 70 74 64  Wt. (Lbs) 441 448 - 461 468 438 426.2  BMI 65.12 60.76 - 62.52 63.47 59.4 57.8   Foot/eye exam completion dates Latest Ref Rng & Units 05/29/2016 02/14/2016  Eye Exam No Retinopathy No Retinopathy -  Foot exam Order - - -  Foot Form Completion - - Done

## 2017-10-26 ENCOUNTER — Ambulatory Visit: Payer: Medicare HMO | Admitting: Sports Medicine

## 2017-11-01 ENCOUNTER — Other Ambulatory Visit: Payer: Self-pay | Admitting: "Endocrinology

## 2017-11-11 ENCOUNTER — Ambulatory Visit (HOSPITAL_COMMUNITY)
Admission: RE | Admit: 2017-11-11 | Discharge: 2017-11-11 | Disposition: A | Discharge status: Home / Self Care | Payer: Medicare HMO | Source: Ambulatory Visit | Attending: Family Medicine | Admitting: Family Medicine

## 2017-11-11 DIAGNOSIS — R51 Headache: Secondary | ICD-10-CM | POA: Diagnosis not present

## 2017-11-11 DIAGNOSIS — G4452 New daily persistent headache (NDPH): Secondary | ICD-10-CM | POA: Insufficient documentation

## 2017-11-12 ENCOUNTER — Other Ambulatory Visit: Payer: Self-pay | Admitting: Family Medicine

## 2017-11-12 ENCOUNTER — Ambulatory Visit (INDEPENDENT_AMBULATORY_CARE_PROVIDER_SITE_OTHER): Payer: Medicare HMO | Admitting: Urology

## 2017-11-12 DIAGNOSIS — E291 Testicular hypofunction: Secondary | ICD-10-CM

## 2017-11-14 ENCOUNTER — Other Ambulatory Visit: Payer: Self-pay | Admitting: Family Medicine

## 2017-11-15 DIAGNOSIS — E291 Testicular hypofunction: Secondary | ICD-10-CM | POA: Diagnosis not present

## 2017-11-16 ENCOUNTER — Ambulatory Visit: Payer: Medicare HMO | Admitting: Sports Medicine

## 2017-11-16 ENCOUNTER — Encounter: Payer: Self-pay | Admitting: Sports Medicine

## 2017-11-16 DIAGNOSIS — E11610 Type 2 diabetes mellitus with diabetic neuropathic arthropathy: Secondary | ICD-10-CM

## 2017-11-16 DIAGNOSIS — I739 Peripheral vascular disease, unspecified: Secondary | ICD-10-CM

## 2017-11-16 DIAGNOSIS — L97511 Non-pressure chronic ulcer of other part of right foot limited to breakdown of skin: Secondary | ICD-10-CM | POA: Diagnosis not present

## 2017-11-16 DIAGNOSIS — E1140 Type 2 diabetes mellitus with diabetic neuropathy, unspecified: Secondary | ICD-10-CM

## 2017-11-16 DIAGNOSIS — E1149 Type 2 diabetes mellitus with other diabetic neurological complication: Secondary | ICD-10-CM

## 2017-11-16 NOTE — Progress Notes (Signed)
Patient ID: Nicholas [name], male   DOB: 19-Jul-1955, 62 y.o.   MRN: [id number]  Subjective: Nicholas [name] is a 62 y.o. Diabetic male patient seen in office for follow up of wound at right 1st toe, states that it has been doing good, has been applying betadine to area when he can wrap it; admits to some bloody drainage, Denies any other symptoms.    Patient Active Problem List   Diagnosis Date Noted  . Headache 10/22/2017  . Depression, major, single episode, moderate ([place]) 10/22/2017  . Type 2 diabetes mellitus with diabetic nephropathy, without long-term current use of insulin ([place]) 06/23/2017  . Neck pain on left side 04/10/2017  . Dental caries 04/10/2017  . Hypothyroidism 08/19/2016  . GERD (gastroesophageal reflux disease) 04/14/2016  . Charcot foot due to diabetes mellitus ([place]) 01/28/2016  . Benign essential tremor 01/28/2016  . Skin lesion of back 10/07/2015  . Back pain with left-sided radiculopathy 06/05/2015  . Hyperlipidemia 04/10/2015  . Abdominal wall bulge 01/15/2015  . Hx of adenomatous colonic polyps 02/21/2014  . Allergic rhinitis 07/24/2013  . Onychomycosis 05/21/2013  . Type 2 diabetes mellitus with pressure callus ([place]) 05/21/2013  . Back pain with radiation 04/20/2011  . Seborrheic dermatitis 11/20/2010  . Dermatomycosis 11/20/2010  . FATIGUE 04/01/2010  . CARPAL TUNNEL SYNDROME, BILATERAL 03/24/2010  . Hypogonadism in male 01/27/2010  . DEPRESSION/ANXIETY 01/27/2010  . CHF 01/13/2010  . DYSPNEA 01/13/2010  . Diabetes mellitus type 2 in obese ([place]) 12/12/2009  . MORBID OBESITY 12/12/2009  . Essential hypertension 12/12/2009  . PVD (peripheral vascular disease) ([place]) 12/12/2009   Current Outpatient Medications on File Prior to Visit  Medication Sig Dispense Refill  . ANDROGEL PUMP 20.25 MG/ACT (1.62%) GEL Apply 2 application topically daily at 2 PM. Apply 1 pump to each shoulder daily in the afternoon    . aspirin EC 81 MG tablet Take 81 mg by  mouth every evening.     . brimonidine-timolol (COMBIGAN) 0.2-0.5 % ophthalmic solution Place 1 drop into both eyes 2 (two) times daily.     . citalopram (CELEXA) 40 MG tablet TAKE 1 TABLET BY MOUTH ONCE A DAY. 90 tablet 0  . Continuous Blood Gluc Sensor (FREESTYLE LIBRE 14 DAY SENSOR) MISC Inject 1 each into the skin every 14 (fourteen) days. Use as directed. 2 each 2  . cyclobenzaprine (FLEXERIL) 10 MG tablet TAKE (1) TABLET BY MOUTH EVERY EIGHT HOURS AS NEEDED FOR MUSCLE SPASMS. 30 tablet 0  . FLUoxetine (PROZAC) 20 MG tablet Take 1 tablet (20 mg total) by mouth daily. 30 tablet 3  . gabapentin (NEURONTIN) 600 MG tablet TAKE ONE TABLET BY MOUTH 3 TIMES DAILY. 270 tablet 0  . HUMULIN R U-500 KWIKPEN 500 UNIT/ML kwikpen INJECT UP TO 100 UNITS SUBCUTANEOUSLY 30 MINUTES BEFORE MEALS. 18 mL 2  . hydrochlorothiazide (HYDRODIURIL) 25 MG tablet Take 1 tablet (25 mg total) by mouth daily. (Patient taking differently: Take 12.5 mg by mouth daily. ) 30 tablet 1  . Insulin Pen Needle (SURE COMFORT PEN NEEDLES) 31G X 8 MM MISC Inject 1 each into the skin 4 (four) times daily. 200 each 2  . ketoconazole (NIZORAL) 2 % shampoo APPLY TOPICALLY TWICE WEEKLY AS DIRECTED. (Patient taking differently: APPLY TOPICALLY TWICE WEEKLY AS NEEDED FOR DRY/ITCHY/FLAKY SCALP.) 120 mL 0  . lamoTRIgine (LAMICTAL) 200 MG tablet TAKE 1 TABLET BY MOUTH ONCE A DAY. 30 tablet 0  . levothyroxine (SYNTHROID, LEVOTHROID) 88 MCG tablet TAKE ONE TABLET BY MOUTH  DAILY BEFORE BREAKFAST. 30 tablet 2  . lisinopril (PRINIVIL,ZESTRIL) 5 MG tablet TAKE 1 TABLET BY MOUTH ONCE A DAY. (Patient taking differently: TAKE 1 TABLET BY MOUTH ONCE A DAY AT NIGHT) 30 tablet 0  . lisinopril (PRINIVIL,ZESTRIL) 5 MG tablet TAKE 1 TABLET BY MOUTH ONCE A DAY. 30 tablet 5  . metoprolol succinate (TOPROL-XL) 25 MG 24 hr tablet TAKE (1) TABLET BY MOUTH ONCE DAILY. 90 tablet 0  . Na Sulfate-K Sulfate-Mg Sulf 17.5-3.13-1.6 GM/177ML SOLN Take 1 kit by mouth as  directed. 1 Bottle 0  . naproxen (NAPROSYN) 500 MG tablet TAKE ONE TABLET BY MOUTH TWICE DAILY WITH A MEAL. 60 tablet 1  . NOVOFINE 30G X 8 MM MISC     . pantoprazole (PROTONIX) 40 MG tablet TAKE (1) TABLET BY MOUTH TWICE DAILY. 180 tablet 3  . simvastatin (ZOCOR) 40 MG tablet TAKE 1 TABLET BY MOUTH ONCE A DAY. 90 tablet 0  . traMADol (ULTRAM) 50 MG tablet TAKE 2 TABLETS BY MOUTH EVERY 8 HOURS AS NEEDED. (Patient taking differently: Take 50-100 mg by mouth 3 (three) times daily as needed (FOR PAIN.). TAKE 2 TABLETS BY MOUTH EVERY 8 HOURS AS NEEDED.) 180 tablet 0  . TRAVATAN Z 0.004 % SOLN ophthalmic solution Place 1 drop into both eyes at bedtime.     . triamcinolone ointment (KENALOG) 0.1 % APPLY TO AFFECTED AREAS TWICE DAILY. (Patient taking differently: APPLY TO AFFECTED AREAS TWICE DAILY AS NEEDED FOR DRY/ITCHY SKIN.) 454 g 0  . VICTOZA 18 MG/3ML SOPN INJECT 1.8 MG SUBCUTANEOUSLY ONCE DAILY. 9 mL 2  . Vitamin D, Ergocalciferol, (DRISDOL) 50000 units CAPS capsule TAKE ONE CAPSULE BY MOUTH ONCE WEEKLY. 12 capsule 0   Current Facility-Administered Medications on File Prior to Visit  Medication Dose Route Frequency Provider Last Rate Last Dose  . triamcinolone acetonide (KENALOG-40) injection 20 mg  20 mg Other Once Landis Martins, DPM       Allergies  Allergen Reactions  . No Allergies On File   . No Known Allergies     Recent Results (from the past 2160 hour(s))  COMPLETE METABOLIC PANEL WITH GFR     Status: Abnormal   Collection Time: 09/14/17 12:44 PM  Result Value Ref Range   Glucose, Bld 182 (H) 65 - 139 mg/dL    Comment: .        Non-fasting reference interval .    BUN 16 7 - 25 mg/dL   Creat 1.14 0.70 - 1.25 mg/dL    Comment: For patients >68 years of age, the reference limit for Creatinine is approximately 13% higher for people identified as African-American. .    GFR, Est Non African American 69 > OR = 60 mL/min/1.7m   GFR, Est African American 79 > OR = 60  mL/min/1.785m  BUN/Creatinine Ratio NOT APPLICABLE 6 - 22 (calc)   Sodium 142 135 - 146 mmol/L   Potassium 5.4 (H) 3.5 - 5.3 mmol/L   Chloride 103 98 - 110 mmol/L   CO2 33 (H) 20 - 32 mmol/L   Calcium 9.7 8.6 - 10.3 mg/dL   Total Protein 6.3 6.1 - 8.1 g/dL   Albumin 4.0 3.6 - 5.1 g/dL   Globulin 2.3 1.9 - 3.7 g/dL (calc)   AG Ratio 1.7 1.0 - 2.5 (calc)   Total Bilirubin 0.4 0.2 - 1.2 mg/dL   Alkaline phosphatase (APISO) 79 40 - 115 U/L   AST 20 10 - 35 U/L   ALT 18 9 -  46 U/L  Hemoglobin A1c     Status: Abnormal   Collection Time: 09/14/17 12:44 PM  Result Value Ref Range   Hgb A1c MFr Bld 7.6 (H) <5.7 % of total Hgb    Comment: For someone without known diabetes, a hemoglobin A1c value of 6.5% or greater indicates that they may have  diabetes and this should be confirmed with a follow-up  test. . For someone with known diabetes, a value <7% indicates  that their diabetes is well controlled and a value  greater than or equal to 7% indicates suboptimal  control. A1c targets should be individualized based on  duration of diabetes, age, comorbid conditions, and  other considerations. . Currently, no consensus exists regarding use of hemoglobin A1c for diagnosis of diabetes for children. .    Mean Plasma Glucose 171 (calc)   eAG (mmol/L) 9.5 (calc)    Objective: There were no vitals filed for this visit.  General: Patient is awake, alert, oriented x 3 and in no acute distress.  Dermatology: Skin is warm and dry bilateral with a partial thickness ulceration measures 0.3x0.2x0.1cm with granular base at right hallux with surrounding reactive keratosis no signs of acute infection at site. Nails are short and mycotic. Left 1st toenail is healing well with no signs of infection.    Vascular: Dorsalis Pedis pulse = 0/4 Bilateral,  Posterior Tibial pulse = 1/4 Bilateral faint,  Capillary Fill Time < 5 seconds, + chronic venous skin changes bilateral.  Neurologic:Protective  sensation diminished using the 5.07/10g Semmes Weinstein Monofilament. Vibratory diminished bilateral.   Musculosketal:  No pain to left 1st toe. No pain to plantar medial and dorsal midfoot on left with accompanying prominent bones supportive of Charcot. No Pain with palpation to ulcerated area at right hallux. + Hallux interphalangeus R>L hallux, No pain with compression to calves bilateral.   Assessment and Plan:  Problem List Items Addressed This Visit      Cardiovascular and Mediastinum   PVD (peripheral vascular disease) ([place])     Endocrine   Charcot foot due to diabetes mellitus ([place])    Other Visit Diagnoses    Toe ulcer, right, limited to breakdown of skin ([place])    -  Primary   Type II diabetes mellitus with neurological manifestations ([place])       Diabetic neuropathy with neurologic complication ([place])         -Examined patient  -Mechanically debrided ulceration at right 1st toe using sterile chisel blade and dressed with betadine and coban dressing on right and antibiotic cream on left. Advised patient to do the same until healed -Continue with offloading pad to right insole  -Continue with gabapentin  -Continue with diabetic shoes  -Patient to return to office in 4-6 weeks for wound check on right or sooner if problems arise.  Landis Martins, DPM

## 2017-11-18 ENCOUNTER — Other Ambulatory Visit: Payer: Self-pay | Admitting: Family Medicine

## 2017-12-16 ENCOUNTER — Other Ambulatory Visit: Payer: Self-pay | Admitting: Family Medicine

## 2017-12-20 ENCOUNTER — Encounter: Payer: Self-pay | Admitting: Family Medicine

## 2017-12-20 ENCOUNTER — Ambulatory Visit (INDEPENDENT_AMBULATORY_CARE_PROVIDER_SITE_OTHER): Payer: Medicare HMO | Admitting: Family Medicine

## 2017-12-20 VITALS — BP 122/80 | HR 83 | Resp 16 | Ht 69.0 in | Wt >= 6400 oz

## 2017-12-20 DIAGNOSIS — I10 Essential (primary) hypertension: Secondary | ICD-10-CM | POA: Diagnosis not present

## 2017-12-20 DIAGNOSIS — R69 Illness, unspecified: Secondary | ICD-10-CM | POA: Diagnosis not present

## 2017-12-20 DIAGNOSIS — F341 Dysthymic disorder: Secondary | ICD-10-CM

## 2017-12-20 DIAGNOSIS — E291 Testicular hypofunction: Secondary | ICD-10-CM

## 2017-12-20 MED ORDER — HYDROCHLOROTHIAZIDE 12.5 MG PO CAPS: 12.5000 mg | ORAL_CAPSULE | Freq: Every day | ORAL | 3 refills | Status: DC

## 2017-12-20 MED ORDER — KETOCONAZOLE 2 % EX SHAM: 1.0000 "application " | MEDICATED_SHAMPOO | CUTANEOUS | 3 refills | Status: DC

## 2017-12-20 NOTE — Patient Instructions (Addendum)
Wellness visit with nurse due , please schedule  Medications sent as discussed, thankful much better  Please work on good  health habits so that your health will improve. 1. Commitment to daily physical activity for 30 to 60  minutes, if you are able to do this.  2. Commitment to wise food choices. Aim for half of your  food intake to be vegetable and fruit, one quarter starchy foods, and one quarter protein. Try to eat on a regular schedule  3 meals per day, snacking between meals should be limited to vegetables or fruits or small portions of nuts. 64 ounces of water per day is generally recommended, unless you have specific health conditions, like heart failure or kidney failure where you will need to limit fluid intake.  3. Commitment to sufficient and a  good quality of physical and mental rest daily, generally between 6 to 8 hours per day.  WITH PERSISTANCE AND PERSEVERANCE, THE IMPOSSIBLE , BECOMES THE NORM!   It is important that you exercise regularly at least 30 minutes 5 times a week. If you develop chest pain, have severe difficulty breathing, or feel very tired, stop exercising immediately and seek medical attention

## 2017-12-21 ENCOUNTER — Ambulatory Visit: Payer: Medicare HMO | Admitting: Sports Medicine

## 2017-12-24 ENCOUNTER — Ambulatory Visit (INDEPENDENT_AMBULATORY_CARE_PROVIDER_SITE_OTHER): Payer: Medicare HMO

## 2017-12-24 VITALS — BP 130/78 | HR 86 | Resp 16 | Ht 69.0 in | Wt >= 6400 oz

## 2017-12-24 DIAGNOSIS — Z0000 Encounter for general adult medical examination without abnormal findings: Secondary | ICD-10-CM | POA: Diagnosis not present

## 2017-12-24 NOTE — Patient Instructions (Addendum)
Nicholas [name] , Thank you for taking time to come for your Medicare Wellness Visit. I appreciate your ongoing commitment to your health goals. Please review the following plan we discussed and let me know if I can assist you in the future.   Screening recommendations/referrals: Colonoscopy: up to date Recommended yearly ophthalmology/optometry visit for glaucoma screening and checkup Recommended yearly dental visit for hygiene and checkup  Vaccinations: Influenza vaccine: up to date Pneumococcal vaccine: up to date Tdap vaccine: up to date Shingles vaccine: up to date   Advanced directives: done   Conditions/risks identified: done  Next appointment: scheduled- 04/21/18 10:20am  Preventive Care 40-64 Years, Male Preventive care refers to lifestyle choices and visits with your health care provider that can promote health and wellness. What does preventive care include?  A yearly physical exam. This is also called an annual well check.  Dental exams once or twice a year.  Routine eye exams. Ask your health care provider how often you should have your eyes checked.  Personal lifestyle choices, including:  Daily care of your teeth and gums.  Regular physical activity.  Eating a healthy diet.  Avoiding tobacco and drug use.  Limiting alcohol use.  Practicing safe sex.  Taking low-dose aspirin every day starting at age 108. What happens during an annual well check? The services and screenings done by your health care provider during your annual well check will depend on your age, overall health, lifestyle risk factors, and family history of disease. Counseling  Your health care provider may ask you questions about your:  Alcohol use.  Tobacco use.  Drug use.  Emotional well-being.  Home and relationship well-being.  Sexual activity.  Eating habits.  Work and work Statistician. Screening  You may have the following tests or measurements:  Height, weight, and  BMI.  Blood pressure.  Lipid and cholesterol levels. These may be checked every 5 years, or more frequently if you are over 62 years old.  Skin check.  Lung cancer screening. You may have this screening every year starting at age 8 if you have a 30-pack-year history of smoking and currently smoke or have quit within the past 15 years.  Fecal occult blood test (FOBT) of the stool. You may have this test every year starting at age 68.  Flexible sigmoidoscopy or colonoscopy. You may have a sigmoidoscopy every 5 years or a colonoscopy every 10 years starting at age 73.  Prostate cancer screening. Recommendations will vary depending on your family history and other risks.  Hepatitis C blood test.  Hepatitis B blood test.  Sexually transmitted disease (STD) testing.  Diabetes screening. This is done by checking your blood sugar (glucose) after you have not eaten for a while (fasting). You may have this done every 1-3 years. Discuss your test results, treatment options, and if necessary, the need for more tests with your health care provider. Vaccines  Your health care provider may recommend certain vaccines, such as:  Influenza vaccine. This is recommended every year.  Tetanus, diphtheria, and acellular pertussis (Tdap, Td) vaccine. You may need a Td booster every 10 years.  Zoster vaccine. You may need this after age 32.  Pneumococcal 13-valent conjugate (PCV13) vaccine. You may need this if you have certain conditions and have not been vaccinated.  Pneumococcal polysaccharide (PPSV23) vaccine. You may need one or two doses if you smoke cigarettes or if you have certain conditions. Talk to your health care provider about which screenings and vaccines you need  and how often you need them. This information is not intended to replace advice given to you by your health care provider. Make sure you discuss any questions you have with your health care provider. Document Released:  07/12/2015 Document Revised: 03/04/2016 Document Reviewed: 04/16/2015 Elsevier Interactive Patient Education  2017 [place] Prevention in the Home Falls can cause injuries. They can happen to people of all ages. There are many things you can do to make your home safe and to help prevent falls. What can I do on the outside of my home?  Regularly fix the edges of walkways and driveways and fix any cracks.  Remove anything that might make you trip as you walk through a door, such as a raised step or threshold.  Trim any bushes or trees on the path to your home.  Use bright outdoor lighting.  Clear any walking paths of anything that might make someone trip, such as rocks or tools.  Regularly check to see if handrails are loose or broken. Make sure that both sides of any steps have handrails.  Any raised decks and porches should have guardrails on the edges.  Have any leaves, snow, or ice cleared regularly.  Use sand or salt on walking paths during winter.  Clean up any spills in your garage right away. This includes oil or grease spills. What can I do in the bathroom?  Use night lights.  Install grab bars by the toilet and in the tub and shower. Do not use towel bars as grab bars.  Use non-skid mats or decals in the tub or shower.  If you need to sit down in the shower, use a plastic, non-slip stool.  Keep the floor dry. Clean up any water that spills on the floor as soon as it happens.  Remove soap buildup in the tub or shower regularly.  Attach bath mats securely with double-sided non-slip rug tape.  Do not have throw rugs and other things on the floor that can make you trip. What can I do in the bedroom?  Use night lights.  Make sure that you have a light by your bed that is easy to reach.  Do not use any sheets or blankets that are too big for your bed. They should not hang down onto the floor.  Have a firm chair that has side arms. You can use this for  support while you get dressed.  Do not have throw rugs and other things on the floor that can make you trip. What can I do in the kitchen?  Clean up any spills right away.  Avoid walking on wet floors.  Keep items that you use a lot in easy-to-reach places.  If you need to reach something above you, use a strong step stool that has a grab bar.  Keep electrical cords out of the way.  Do not use floor polish or wax that makes floors slippery. If you must use wax, use non-skid floor wax.  Do not have throw rugs and other things on the floor that can make you trip. What can I do with my stairs?  Do not leave any items on the stairs.  Make sure that there are handrails on both sides of the stairs and use them. Fix handrails that are broken or loose. Make sure that handrails are as long as the stairways.  Check any carpeting to make sure that it is firmly attached to the stairs. Fix any carpet that is  loose or worn.  Avoid having throw rugs at the top or bottom of the stairs. If you do have throw rugs, attach them to the floor with carpet tape.  Make sure that you have a light switch at the top of the stairs and the bottom of the stairs. If you do not have them, ask someone to add them for you. What else can I do to help prevent falls?  Wear shoes that:  Do not have high heels.  Have rubber bottoms.  Are comfortable and fit you well.  Are closed at the toe. Do not wear sandals.  If you use a stepladder:  Make sure that it is fully opened. Do not climb a closed stepladder.  Make sure that both sides of the stepladder are locked into place.  Ask someone to hold it for you, if possible.  Clearly mark and make sure that you can see:  Any grab bars or handrails.  First and last steps.  Where the edge of each step is.  Use tools that help you move around (mobility aids) if they are needed. These include:  Canes.  Walkers.  Scooters.  Crutches.  Turn on the  lights when you go into a dark area. Replace any light bulbs as soon as they burn out.  Set up your furniture so you have a clear path. Avoid moving your furniture around.  If any of your floors are uneven, fix them.  If there are any pets around you, be aware of where they are.  Review your medicines with your doctor. Some medicines can make you feel dizzy. This can increase your chance of falling. Ask your doctor what other things that you can do to help prevent falls. This information is not intended to replace advice given to you by your health care provider. Make sure you discuss any questions you have with your health care provider. Document Released: 04/11/2009 Document Revised: 11/21/2015 Document Reviewed: 07/20/2014 Elsevier Interactive Patient Education  2017 [employer].

## 2017-12-24 NOTE — Progress Notes (Signed)
Subjective:   Nicholas [name] is a 62 y.o. male who presents for Medicare Annual/Subsequent preventive examination.  Review of Systems:         Objective:    Vitals: BP 130/78   Pulse 86   Resp 16   Ht _0  (1.753 m)   Wt (!) 438 lb (198.7 kg)   SpO2 96%   BMI 64.68 kg/m   Body mass index is 64.68 kg/m.  Advanced Directives 12/24/2017 07/23/2017 11/02/2016 09/10/2016 05/04/2016 03/27/2016 12/31/2015  Does Patient Have a Medical Advance Directive? Yes _1  No  Type of Advance Directive Living will - - - - - -  Does patient want to make changes to medical advance directive? - - - - - - -  Copy of [place] in Chart? - - - - - - -  Would patient like information on creating a medical advance directive? - No - Patient declined Yes (MAU/Ambulatory/Procedural Areas - Information given) No - Patient declined No - patient declined information No - patient declined information No - patient declined information    Tobacco Social History   Tobacco Use  Smoking Status Former Smoker  . Packs/day: 1.50  . Years: 25.00  . Pack years: 37.50  . Types: Cigarettes  . Last attempt to quit: 02/20/2002  . Years since quitting: 15.8  Smokeless Tobacco Former Systems developer  . Types: Snuff  . Quit date: 11/02/1980  Tobacco Comment   Quit x 15 years     Counseling given: Not Answered Comment: Quit x 15 years   Clinical Intake:  Pre-visit preparation completed: Yes  Pain : 0-10 Pain Score: 1  Pain Type: Chronic pain     Nutritional Status: BMI > 30  Obese Diabetes: Yes CBG done?: No Did pt. bring in CBG monitor from home?: No  How often do you need to have someone help you when you read instructions, pamphlets, or other written materials from your doctor or pharmacy?: 2 - Rarely What is the last grade level you completed in school?: 12th   Interpreter Needed?: No  Information entered by :: Matilynn Dacey LPN   Past Medical History:  Diagnosis Date  . Chronic  knee pain   . Depression   . Diabetes ([place])   . Glaucoma   . Hyperlipidemia   . Hypertension   . MRSA (methicillin resistant Staphylococcus aureus)    patient denies  . Neuropathy   . Poor circulation    leg   . Suicide attempt ([place])   . Testosterone deficiency    Past Surgical History:  Procedure Laterality Date  . CHOLECYSTECTOMY N/A 02/25/2015   Procedure: LAPAROSCOPIC CHOLECYSTECTOMY;  Surgeon: Aviva Signs, MD;  Location: [facility];  Service: General;  Laterality: N/A;  . COLONOSCOPY  03/2008   DZH:GDJMEQ external hemorrhoidal tag, otherwise normal rectum/Two diminutive rectosigmoid polyps s/p bx/ Polyp in the opposite the ileocecal valve s/p bx. tubular adenoma.  . COLONOSCOPY N/A 03/23/2014   AST:MHDQQIWL colon polyps throughout his rectum and colon. Sessile and pedunculated. The largest polyp was proximally 6 mm in dimensions in the retum at 3 cm with adjacent diminutive polyp.  There was also a polyps on ileocecal valve, ascending segmen descending and sigmoid segment. as the colon was tortuous & elongated requiring externa abd pressure and changing of the pts postion to reach cecum  . COLONOSCOPY WITH PROPOFOL N/A 07/29/2017   Procedure: COLONOSCOPY WITH PROPOFOL;  Surgeon: Daneil Dolin, MD;  Location: [facility];  Service: Endoscopy;  Laterality: N/A;  12:30pm  . ESOPHAGOGASTRODUODENOSCOPY N/A 03/23/2014   RMR: 1. Patient had circumferential distal esophageal erosions within 5 mm of the GE junction.  No Barrett's esophagus. Tubular esophagus patent throughout its course. 2.  Diffuse gastric erosions. (1) 73m area of healing ulceration in the antrum. No ulcer or infiltrating process observed. Patent pylorus. Normal-appearing first and second portion of the duodenum .  . kidney stones     cysto with litholapexy  . POLYPECTOMY  07/29/2017   Procedure: POLYPECTOMY;  Surgeon: RDaneil Dolin MD;  Location: [facility];  Service: Endoscopy;;  colon    Family History  Problem  Relation Age of Onset  . Diabetes Mother   . Leukemia Mother   . Cancer Father        colon, age greater than 660  .Marland KitchenHeart disease Father   . Glaucoma Father   . Parkinsonism Father        brain hemorrhage  . Alzheimer's disease Father   . Glaucoma Sister   . Dementia Sister   . Colon cancer Neg Hx    Social History   Socioeconomic History  . Marital status: Divorced    Spouse name: Not on file  . Number of children: 0  . Years of education: 179 . Highest education level: Not on file  Occupational History  . Occupation: DISABLED    Employer: UNEMPLOYED  Social Needs  . Financial resource strain: Not on file  . Food insecurity:    Worry: Sometimes true    Inability: Sometimes true  . Transportation needs:    Medical: No    Non-medical: No  Tobacco Use  . Smoking status: Former Smoker    Packs/day: 1.50    Years: 25.00    Pack years: 37.50    Types: Cigarettes    Last attempt to quit: 02/20/2002    Years since quitting: 15.8  . Smokeless tobacco: Former [employer]ystems developer   Types: Snuff    Quit date: 11/02/1980  . Tobacco comment: Quit x 15 years  Substance and Sexual Activity  . Alcohol use: No  . Drug use: No  . Sexual activity: Not Currently    Birth control/protection: None  Lifestyle  . Physical activity:    Days per week: 0 days    Minutes per session: 0 min  . Stress: To some extent  Relationships  . Social connections:    Talks on phone: More than three times a week    Gets together: More than three times a week    Attends religious service: More than 4 times per year    Active member of club or organization: Yes    Attends meetings of clubs or organizations: More than 4 times per year    Relationship status: Divorced  Other Topics Concern  . Not on file  Social History Narrative  . Not on file    Outpatient Encounter Medications as of 12/24/2017  Medication Sig  . ANDROGEL PUMP 20.25 MG/ACT (1.62%) GEL Apply 2 application topically daily at 2 PM. Apply 1  pump to each shoulder daily in the afternoon  . aspirin EC 81 MG tablet Take 81 mg by mouth every evening.   . brimonidine-timolol (COMBIGAN) 0.2-0.5 % ophthalmic solution Place 1 drop into both eyes 2 (two) times daily.   . citalopram (CELEXA) 40 MG tablet TAKE 1 TABLET BY MOUTH ONCE A DAY.  .Marland KitchenContinuous Blood Gluc Sensor (FREESTYLE LIBRE 14  DAY SENSOR) MISC Inject 1 each into the skin every 14 (fourteen) days. Use as directed.  . cyclobenzaprine (FLEXERIL) 10 MG tablet TAKE (1) TABLET BY MOUTH EVERY EIGHT HOURS AS NEEDED FOR MUSCLE SPASMS.  Marland Kitchen FLUoxetine (PROZAC) 20 MG tablet Take 1 tablet (20 mg total) by mouth daily.  Marland Kitchen gabapentin (NEURONTIN) 600 MG tablet TAKE ONE TABLET BY MOUTH 3 TIMES DAILY.  Marland Kitchen HUMULIN R U-500 KWIKPEN 500 UNIT/ML kwikpen INJECT UP TO 100 UNITS SUBCUTANEOUSLY 30 MINUTES BEFORE MEALS.  . hydrochlorothiazide (MICROZIDE) 12.5 MG capsule Take 1 capsule (12.5 mg total) by mouth daily.  . Insulin Pen Needle (SURE COMFORT PEN NEEDLES) 31G X 8 MM MISC Inject 1 each into the skin 4 (four) times daily.  Marland Kitchen ketoconazole (NIZORAL) 2 % shampoo Apply 1 application topically 2 (two) times a week.  . levothyroxine (SYNTHROID, LEVOTHROID) 88 MCG tablet TAKE ONE TABLET BY MOUTH DAILY BEFORE BREAKFAST.  Marland Kitchen lisinopril (PRINIVIL,ZESTRIL) 5 MG tablet TAKE 1 TABLET BY MOUTH ONCE A DAY. (Patient taking differently: TAKE 1 TABLET BY MOUTH ONCE A DAY AT NIGHT)  . metoprolol succinate (TOPROL-XL) 25 MG 24 hr tablet TAKE (1) TABLET BY MOUTH ONCE DAILY.  . Na Sulfate-K Sulfate-Mg Sulf 17.5-3.13-1.6 GM/177ML SOLN Take 1 kit by mouth as directed.  . naproxen (NAPROSYN) 500 MG tablet TAKE ONE TABLET BY MOUTH TWICE DAILY WITH A MEAL.  Marland Kitchen NOVOFINE 30G X 8 MM MISC   . pantoprazole (PROTONIX) 40 MG tablet TAKE (1) TABLET BY MOUTH TWICE DAILY.  . simvastatin (ZOCOR) 40 MG tablet TAKE 1 TABLET BY MOUTH ONCE A DAY.  . traMADol (ULTRAM) 50 MG tablet TAKE 2 TABLETS BY MOUTH EVERY 8 HOURS AS NEEDED. (Patient taking  differently: Take 50-100 mg by mouth 3 (three) times daily as needed (FOR PAIN.). TAKE 2 TABLETS BY MOUTH EVERY 8 HOURS AS NEEDED.)  . TRAVATAN Z 0.004 % SOLN ophthalmic solution Place 1 drop into both eyes at bedtime.   . triamcinolone ointment (KENALOG) 0.1 % APPLY TO AFFECTED AREAS TWICE DAILY. (Patient taking differently: APPLY TO AFFECTED AREAS TWICE DAILY AS NEEDED FOR DRY/ITCHY SKIN.)  . VICTOZA 18 MG/3ML SOPN INJECT 1.8 MG SUBCUTANEOUSLY ONCE DAILY.  Marland Kitchen Vitamin D, Ergocalciferol, (DRISDOL) 50000 units CAPS capsule TAKE ONE CAPSULE BY MOUTH ONCE WEEKLY.  . lamoTRIgine (LAMICTAL) 200 MG tablet TAKE 1 TABLET BY MOUTH ONCE A DAY.  Marland Kitchen testosterone cypionate (DEPOTESTOSTERONE CYPIONATE) 200 MG/ML injection    Facility-Administered Encounter Medications as of 12/24/2017  Medication  . triamcinolone acetonide (KENALOG-40) injection 20 mg    Activities of Daily Living In your present state of health, do you have any difficulty performing the following activities: 12/24/2017 07/23/2017  Hearing? N N  Vision? N N  Difficulty concentrating or making decisions? N N  Walking or climbing stairs? Y Y  Dressing or bathing? N N  Doing errands, shopping? N N  Some recent data might be hidden    Patient Care Team: Fayrene Helper, MD as PCP - General Rourk, Cristopher Estimable, MD as Consulting Physician (Gastroenterology) Cassandria Anger, MD as Consulting Physician (Endocrinology) Madelin Headings, DO (Osteopathic Medicine) Franchot Gallo, MD as Consulting Physician (Urology) Landis Martins, DPM as Consulting Physician (Podiatry)   Assessment:   This is a routine wellness examination for Pasqualino.  Exercise Activities and Dietary recommendations    Goals    . Exercise 3x per week (30 min per time)     Recommend starting a routine exercise program at least 3 days a  week for 30-45 minutes at a time as tolerated.      . Exercise 3x per week (30 min per time)       Fall Risk Fall Risk   12/24/2017 12/20/2017 09/23/2017 06/23/2017 03/02/2017  Falls in the past year? Yes Yes No No No  Number falls in past yr: 1 2 or more - - -  Injury with Fall? - - - - -  Risk Factor Category  - - - - -  Risk for fall due to : - - - - -  Follow up - - - - -  Comment - - - - -   Is the patient's home free of loose throw rugs in walkways, pet beds, electrical cords, etc?   yes      Grab bars in the bathroom? yes      Handrails on the stairs?   yes      Adequate lighting?   yes  Timed Get Up and Go Performed: no  Depression Screen PHQ 2/9 Scores 12/24/2017 12/20/2017 10/18/2017 09/23/2017  PHQ - 2 Score _0 0  PHQ- 9 Score _1 -    Cognitive Function     6CIT Screen 12/24/2017 11/02/2016  What Year? 0 points 0 points  What month? 0 points 0 points  What time? 0 points 0 points  Count back from 20 0 points 0 points  Months in reverse - 0 points  Repeat phrase 0 points 0 points  Total Score - 0    Immunization History  Administered Date(s) Administered  . Influenza Whole 04/01/2010  . Influenza,inj,Quad PF,6+ Mos 03/08/2013  . Pneumococcal Conjugate-13 07/09/2014  . Pneumococcal Polysaccharide-23 04/01/2010, 10/07/2015  . Tdap 11/24/2011  . Zoster 12/12/2014    Qualifies for Shingles Vaccine? Yes  Screening Tests Health Maintenance  Topic Date Due  . INFLUENZA VACCINE  03/21/2018 (Originally 01/27/2018)  . HEMOGLOBIN A1C  03/17/2018  . OPHTHALMOLOGY EXAM  10/13/2018  . FOOT EXAM  12/21/2018  . COLONOSCOPY  07/29/2020  . TETANUS/TDAP  11/23/2021  . PNEUMOCOCCAL POLYSACCHARIDE VACCINE  Completed  . Hepatitis C Screening  Completed  . HIV Screening  Completed   Cancer Screenings: Lung: Low Dose CT Chest recommended if Age 40-80 years, 30 pack-year currently smoking OR have quit w/in 15years. Patient does not qualify. Colorectal: due 07/29/2020  Additional Screenings:  Hepatitis C Screening:      Plan:     I have personally reviewed and noted the following in the  patient's chart:   . Medical and social history . Use of alcohol, tobacco or illicit drugs  . Current medications and supplements . Functional ability and status . Nutritional status . Physical activity . Advanced directives . List of other physicians . Hospitalizations, surgeries, and ER visits in previous 12 months . Vitals . Screenings to include cognitive, depression, and falls . Referrals and appointments  In addition, I have reviewed and discussed with patient certain preventive protocols, quality metrics, and best practice recommendations. A written personalized care plan for preventive services as well as general preventive health recommendations were provided to patient.     Kate Sable, LPN, LPN  10/20/5359

## 2017-12-26 ENCOUNTER — Encounter: Payer: Self-pay | Admitting: Family Medicine

## 2017-12-26 NOTE — Assessment & Plan Note (Signed)
Much improved on current dose of fluoxetine, still likely that patient needs and will benefit from therapy will contact him to follow up on his interest in tele psychiatry, I do note that 2 days later his score is up as is his weight

## 2017-12-26 NOTE — Assessment & Plan Note (Signed)
Improved. Patient re-educated about  the importance of commitment to a  minimum of 150 minutes of exercise per week.  The importance of healthy food choices with portion control discussed. Encouraged to start a food diary, count calories and to consider  joining a support group. Sample diet sheets offered. Goals set by the patient for the next several months.   Weight /BMI 12/24/2017 12/20/2017 10/18/2017  WEIGHT 438 lb 432 lb 441 lb  HEIGHT 5\' 9"  5\' 9"  5\' 9"   BMI 64.68 kg/m2 63.8 kg/m2 65.12 kg/m2

## 2017-12-26 NOTE — Progress Notes (Signed)
Nicholas [name]     MRN: [id number]      DOB: 21-Feb-1956   HPI Nicholas [name] is here for follow up and re-evaluation of chronic medical conditions,in particular depression, which he was started on medication for at the last visit, as well as for morbid obesity, which has markedly improved since starting treatment for his depression . Patient states he feels very much better , says he has put his ex behind nad and he has moved on, and is currently heavily involved in, and enjoying on line dating  Preventive health is updated, specifically  Cancer screening and Immunization.   States he was never contacted by tele psychology, but he no longer needs this The PT denies any adverse reactions to current medications since the last visit. Requests lower dose HCTZ so he does not have to cut the 25 mg tablet There are no new concerns.  There are no specific complaints   ROS Denies recent fever or chills. Denies sinus pressure, nasal congestion, ear pain or sore throat. Denies chest congestion, productive cough or wheezing. Denies chest pains, palpitations and leg swelling Denies abdominal pain, nausea, vomiting,diarrhea or constipation.   Denies dysuria, frequency, hesitancy or incontinence. Denies uncontrolled joint pain, swelling and limitation in mobility. Denies headaches, seizures, numbness, or tingling. Denies uncontrolled depression, anxiety or insomnia.Reports marked improvement  Denies skin break down or rash.   PE BP 122/80   Pulse 83   Resp 16   Ht 5\' 9"  (1.753 m)   Wt (!) 432 lb (196 kg)   SpO2 93%   BMI 63.80 kg/m    BP 122/80   Pulse 83   Resp 16   Ht 5\' 9"  (1.753 m)   Wt (!) 432 lb (196 kg)   SpO2 93%   BMI 63.80 kg/m   Patient alert and oriented and in no cardiopulmonary distress.  HEENT: No facial asymmetry, EOMI,   oropharynx pink and moist.  Neck supple no JVD, no mass.  Chest: Clear to auscultation bilaterally.  CVS: S1, S2 no murmurs, no S3.Regular  rate.  ABD: Soft non tender.   Ext: No edema  MS: Adequate though reduced ROM spine, shoulders, hips and knees.  Skin: Intact, no ulcerations or rash noted.  Psych: Good eye contact, normal affect. Memory intact not anxious or depressed appearing.  CNS: CN 2-12 intact, power,  normal throughout.no focal deficits noted.   Assessment & Plan  DEPRESSION/ANXIETY Much improved on current dose of fluoxetine, still likely that patient needs and will benefit from therapy will contact him to follow up on his interest in tele psychiatry, I do note that 2 days later his score is up as is his weight   Essential hypertension Controlled, no change in medication, tablet changed however to HCTZ 12.5 mg capsule DASH diet and commitment to daily physical activity for a minimum of 30 minutes discussed and encouraged, as a part of hypertension management. The importance of attaining a healthy weight is also discussed.  BP/Weight 12/24/2017 12/20/2017 10/18/2017 09/23/2017 07/29/2017 07/23/2017 0/86/5784  Systolic BP 696 295 284 132 440 102 725  Diastolic BP 78 80 80 77 41 67 70  Wt. (Lbs) 438 432 441 448 - 461 468  BMI 64.68 63.8 65.12 60.76 - 62.52 63.47        MORBID OBESITY Improved. Patient re-educated about  the importance of commitment to a  minimum of 150 minutes of exercise per week.  The importance of healthy food choices with portion  control discussed. Encouraged to start a food diary, count calories and to consider  joining a support group. Sample diet sheets offered. Goals set by the patient for the next several months.   Weight /BMI 12/24/2017 12/20/2017 10/18/2017  WEIGHT 438 lb 432 lb 441 lb  HEIGHT 5\' 9"  5\' 9"  5\' 9"   BMI 64.68 kg/m2 63.8 kg/m2 65.12 kg/m2      Hypogonadism in male Reports that he is no longer able to afford the medication/ labs needed prescribed by urology which may become a problem in his overall wellbeing, will need to follow up on this

## 2017-12-26 NOTE — Assessment & Plan Note (Signed)
Reports that he is no longer able to afford the medication/ labs needed prescribed by urology which may become a problem in his overall wellbeing, will need to follow up on this

## 2017-12-26 NOTE — Assessment & Plan Note (Signed)
Controlled, no change in medication, tablet changed however to HCTZ 12.5 mg capsule DASH diet and commitment to daily physical activity for a minimum of 30 minutes discussed and encouraged, as a part of hypertension management. The importance of attaining a healthy weight is also discussed.  BP/Weight 12/24/2017 12/20/2017 10/18/2017 09/23/2017 07/29/2017 07/23/2017 6/38/7564  Systolic BP 332 951 884 166 063 016 010  Diastolic BP 78 80 80 77 41 67 70  Wt. (Lbs) 438 432 441 448 - 461 468  BMI 64.68 63.8 65.12 60.76 - 62.52 63.47

## 2017-12-28 ENCOUNTER — Telehealth: Payer: Self-pay | Admitting: Family Medicine

## 2017-12-28 NOTE — Telephone Encounter (Signed)
Patient left voicemail requesting a prescription for fluid pills.  [employer]. Cb# [id number]/ [id number]

## 2017-12-28 NOTE — Telephone Encounter (Signed)
Spoke with patient and he is complaining of LEE. He states that when he takes off his socks he can see indents of where his socks were. Advised him I would send Dr.Simpson a message and see what her recommendations were with verbal understanding. He understands his fluid pill was just sent in to the pharmacy last week with refills.

## 2017-12-29 NOTE — Telephone Encounter (Signed)
Needs to elevate his leg an take the fluid pill as prescribed he is not retaining excess fluid , I also spoke about referring him to telepsych , he agrees to that

## 2017-12-31 ENCOUNTER — Telehealth: Payer: Self-pay

## 2017-12-31 NOTE — Telephone Encounter (Signed)
[facility] - Left Msg 

## 2018-01-04 ENCOUNTER — Encounter: Payer: Self-pay | Admitting: Sports Medicine

## 2018-01-04 ENCOUNTER — Ambulatory Visit: Payer: Medicare HMO | Admitting: Sports Medicine

## 2018-01-04 VITALS — BP 147/69 | HR 76 | Temp 97.8°F | Resp 16

## 2018-01-04 DIAGNOSIS — E1149 Type 2 diabetes mellitus with other diabetic neurological complication: Secondary | ICD-10-CM

## 2018-01-04 DIAGNOSIS — I739 Peripheral vascular disease, unspecified: Secondary | ICD-10-CM

## 2018-01-04 DIAGNOSIS — E1140 Type 2 diabetes mellitus with diabetic neuropathy, unspecified: Secondary | ICD-10-CM | POA: Diagnosis not present

## 2018-01-04 DIAGNOSIS — B351 Tinea unguium: Secondary | ICD-10-CM | POA: Diagnosis not present

## 2018-01-04 DIAGNOSIS — L97511 Non-pressure chronic ulcer of other part of right foot limited to breakdown of skin: Secondary | ICD-10-CM

## 2018-01-04 DIAGNOSIS — E11610 Type 2 diabetes mellitus with diabetic neuropathic arthropathy: Secondary | ICD-10-CM

## 2018-01-04 NOTE — Progress Notes (Signed)
Patient ID: ALUCARD FEARNOW, male   DOB: January 04, 1956, 62 y.o.   MRN: [id number]  Subjective: Nicholas [name] is a 62 y.o. Diabetic male patient seen in office for follow up of wound at right 1st toe, states that it is better, has been applying betadine to area when he can wrap it; admits to some bloody drainage, Denies any other symptoms.   Patient is also here for nail care. Denies any other issues.    Patient Active Problem List   Diagnosis Date Noted  . Headache 10/22/2017  . Depression, major, single episode, moderate ([place]) 10/22/2017  . Type 2 diabetes mellitus with diabetic nephropathy, without long-term current use of insulin ([place]) 06/23/2017  . Neck pain on left side 04/10/2017  . Dental caries 04/10/2017  . Hypothyroidism 08/19/2016  . GERD (gastroesophageal reflux disease) 04/14/2016  . Charcot foot due to diabetes mellitus ([place]) 01/28/2016  . Benign essential tremor 01/28/2016  . Skin lesion of back 10/07/2015  . Back pain with left-sided radiculopathy 06/05/2015  . Hyperlipidemia 04/10/2015  . Abdominal wall bulge 01/15/2015  . Hx of adenomatous colonic polyps 02/21/2014  . Allergic rhinitis 07/24/2013  . Onychomycosis 05/21/2013  . Type 2 diabetes mellitus with pressure callus ([place]) 05/21/2013  . Back pain with radiation 04/20/2011  . Seborrheic dermatitis 11/20/2010  . Dermatomycosis 11/20/2010  . FATIGUE 04/01/2010  . CARPAL TUNNEL SYNDROME, BILATERAL 03/24/2010  . Hypogonadism in male 01/27/2010  . DEPRESSION/ANXIETY 01/27/2010  . CHF 01/13/2010  . DYSPNEA 01/13/2010  . Diabetes mellitus type 2 in obese ([place]) 12/12/2009  . MORBID OBESITY 12/12/2009  . Essential hypertension 12/12/2009  . PVD (peripheral vascular disease) ([place]) 12/12/2009   Current Outpatient Medications on File Prior to Visit  Medication Sig Dispense Refill  . ANDROGEL PUMP 20.25 MG/ACT (1.62%) GEL Apply 2 application topically daily at 2 PM. Apply 1 pump to each shoulder daily in the  afternoon    . aspirin EC 81 MG tablet Take 81 mg by mouth every evening.     . brimonidine-timolol (COMBIGAN) 0.2-0.5 % ophthalmic solution Place 1 drop into both eyes 2 (two) times daily.     . citalopram (CELEXA) 40 MG tablet TAKE 1 TABLET BY MOUTH ONCE A DAY. 90 tablet 0  . Continuous Blood Gluc Sensor (FREESTYLE LIBRE 14 DAY SENSOR) MISC Inject 1 each into the skin every 14 (fourteen) days. Use as directed. 2 each 2  . cyclobenzaprine (FLEXERIL) 10 MG tablet TAKE (1) TABLET BY MOUTH EVERY EIGHT HOURS AS NEEDED FOR MUSCLE SPASMS. 30 tablet 0  . FLUoxetine (PROZAC) 20 MG tablet Take 1 tablet (20 mg total) by mouth daily. 30 tablet 3  . gabapentin (NEURONTIN) 600 MG tablet TAKE ONE TABLET BY MOUTH 3 TIMES DAILY. 270 tablet 0  . HUMULIN R U-500 KWIKPEN 500 UNIT/ML kwikpen INJECT UP TO 100 UNITS SUBCUTANEOUSLY 30 MINUTES BEFORE MEALS. 18 mL 2  . hydrochlorothiazide (MICROZIDE) 12.5 MG capsule Take 1 capsule (12.5 mg total) by mouth daily. 90 capsule 3  . Insulin Pen Needle (SURE COMFORT PEN NEEDLES) 31G X 8 MM MISC Inject 1 each into the skin 4 (four) times daily. 200 each 2  . ketoconazole (NIZORAL) 2 % shampoo Apply 1 application topically 2 (two) times a week. 120 mL 3  . lamoTRIgine (LAMICTAL) 200 MG tablet TAKE 1 TABLET BY MOUTH ONCE A DAY. 30 tablet 0  . levothyroxine (SYNTHROID, LEVOTHROID) 88 MCG tablet TAKE ONE TABLET BY MOUTH DAILY BEFORE BREAKFAST. 30 tablet 2  .  lisinopril (PRINIVIL,ZESTRIL) 5 MG tablet TAKE 1 TABLET BY MOUTH ONCE A DAY. (Patient taking differently: TAKE 1 TABLET BY MOUTH ONCE A DAY AT NIGHT) 30 tablet 0  . metoprolol succinate (TOPROL-XL) 25 MG 24 hr tablet TAKE (1) TABLET BY MOUTH ONCE DAILY. 90 tablet 0  . Na Sulfate-K Sulfate-Mg Sulf 17.5-3.13-1.6 GM/177ML SOLN Take 1 kit by mouth as directed. 1 Bottle 0  . naproxen (NAPROSYN) 500 MG tablet TAKE ONE TABLET BY MOUTH TWICE DAILY WITH A MEAL. 60 tablet 0  . NOVOFINE 30G X 8 MM MISC     . pantoprazole (PROTONIX) 40 MG  tablet TAKE (1) TABLET BY MOUTH TWICE DAILY. 180 tablet 3  . simvastatin (ZOCOR) 40 MG tablet TAKE 1 TABLET BY MOUTH ONCE A DAY. 90 tablet 0  . testosterone cypionate (DEPOTESTOSTERONE CYPIONATE) 200 MG/ML injection     . traMADol (ULTRAM) 50 MG tablet TAKE 2 TABLETS BY MOUTH EVERY 8 HOURS AS NEEDED. (Patient taking differently: Take 50-100 mg by mouth 3 (three) times daily as needed (FOR PAIN.). TAKE 2 TABLETS BY MOUTH EVERY 8 HOURS AS NEEDED.) 180 tablet 0  . TRAVATAN Z 0.004 % SOLN ophthalmic solution Place 1 drop into both eyes at bedtime.     . triamcinolone ointment (KENALOG) 0.1 % APPLY TO AFFECTED AREAS TWICE DAILY. (Patient taking differently: APPLY TO AFFECTED AREAS TWICE DAILY AS NEEDED FOR DRY/ITCHY SKIN.) 454 g 0  . VICTOZA 18 MG/3ML SOPN INJECT 1.8 MG SUBCUTANEOUSLY ONCE DAILY. 9 mL 2  . Vitamin D, Ergocalciferol, (DRISDOL) 50000 units CAPS capsule TAKE ONE CAPSULE BY MOUTH ONCE WEEKLY. 12 capsule 0   Current Facility-Administered Medications on File Prior to Visit  Medication Dose Route Frequency Provider Last Rate Last Dose  . triamcinolone acetonide (KENALOG-40) injection 20 mg  20 mg Other Once Landis Martins, DPM       Allergies  Allergen Reactions  . No Allergies On File   . No Known Allergies     Recent Results (from the past 2160 hour(s))  HM DIABETES EYE EXAM     Status: None   Collection Time: 10/12/17 12:00 AM  Result Value Ref Range   HM Diabetic Eye Exam No Retinopathy No Retinopathy    Objective: There were no vitals filed for this visit.  General: Patient is awake, alert, oriented x 3 and in no acute distress.  Dermatology: Skin is warm and dry bilateral with a partial thickness ulceration measures 0.8x0.5x0.1cm with granular base at right hallux with surrounding reactive keratosis no signs of acute infection at site. Nails are elongated and thickened consistent with mycosis.    Vascular: Dorsalis Pedis pulse = 0/4 Bilateral,  Posterior Tibial pulse =  1/4 Bilateral faint,  Capillary Fill Time < 5 seconds, + chronic venous skin changes bilateral.  Neurologic:Protective sensation diminished using the 5.07/10g Semmes Weinstein Monofilament. Vibratory diminished bilateral.   Musculosketal:  Chronic pain to plantar medial and dorsal midfoot on left with accompanying prominent bones supportive of Charcot. No Pain with palpation to ulcerated area at right hallux. + Hallux interphalangeus R>L hallux, No pain with compression to calves bilateral.   Assessment and Plan:  Problem List Items Addressed This Visit      Cardiovascular and Mediastinum   PVD (peripheral vascular disease) ([place])     Endocrine   Charcot foot due to diabetes mellitus ([place])    Other Visit Diagnoses    Toe ulcer, right, limited to breakdown of skin ([place])    -  Primary  Type II diabetes mellitus with neurological manifestations (HCC)       Diabetic neuropathy with neurologic complication (HCC)       Dermatophytosis of nail         -Examined patient  -Mechanically debrided nails x 10 using sterile nail nipper without incident and debrided ulceration at right 1st toe using sterile chisel blade and dressed with betadine. Advised patient to do the same until healed -Continue with offloading pad to right insole  -Continue with gabapentin  -Continue with diabetic shoes  -Patient to return to office in 10-12 weeks for nail trim and wound check on right or sooner if problems arise.  Landis Martins, DPM

## 2018-01-07 ENCOUNTER — Telehealth: Payer: Self-pay

## 2018-01-07 DIAGNOSIS — F321 Major depressive disorder, single episode, moderate: Secondary | ICD-10-CM

## 2018-01-07 DIAGNOSIS — F489 Nonpsychotic mental disorder, unspecified: Secondary | ICD-10-CM

## 2018-01-07 NOTE — BH Specialist Note (Signed)
[facility] Initial Clinical Assessment  MRN: [id number] NAME: Nicholas [name] Date: 01/07/18   Total time: 1 hour  Type of Contact: Type of Contact: Phone Call Initial Contact Patient consent obtained: Patient consent obtained for Virtual Visit: (NA) Reason for Visit today: Reason for Your Call/Visit Today: Phone [facility] Initial Intake Assessment   Treatment History Patient recently received Inpatient Treatment: Have You Recently Been in Any Inpatient Treatment (Hospital/Detox/Crisis Center/28-Day Program)?: Yes  Facility/Program: Name/Location of Program/Hospital: Unable to remember   Date of discharge: When Were You Discharged?: 11/07/05 Patient currently being seen by therapist/psychiatrist: Do You Currently Have a Therapist/Psychiatrist?: No Patient currently receiving the following services: Patient Currently Receiving the Following Services:: Medication Management(  PCP prescribes psychiatric medication )   Psychiatric History  Past Psychiatric History/Hospitalization(s): Anxiety: No Bipolar Disorder: No Depression: Yes Mania: No Psychosis: No Schizophrenia: No Personality Disorder: No Hospitalization for psychiatric illness: Yes August 18, 2005 ,when his father died.  History of Electroconvulsive Shock Therapy: No Prior Suicide Attempts: No Decreased need for sleep: No  Euphoria: No Self Injurious behaviors No Family History of mental illness: No Family History of substance abuse: No  Substance Abuse: No  DUI: No  Insomnia: No  History of violence No  Physical, sexual or emotional abuse:No  Prior outpatient mental health therapy: No            Clinical Assessment:  PHQ-9 Assessments: Depression screen [facility] 2/9 01/07/2018 12/24/2017 12/20/2017  Decreased Interest _0 Down, Depressed, Hopeless 2 0 1  PHQ - 2 Score _1 Altered sleeping 1 1 0  Tired, decreased energy _2 Change in appetite 0 2 0  Feeling bad or failure about yourself  0 0 0   Trouble concentrating 0 0 0  Moving slowly or fidgety/restless 0 2 0  Suicidal thoughts 0 0 0  PHQ-9 Score _3 Difficult doing work/chores Not difficult at all Not difficult at all Somewhat difficult  Some recent data might be hidden     GAD-7 Assessments: GAD 7 : Generalized Anxiety Score 01/07/2018  Nervous, Anxious, on Edge 1  Control/stop worrying 0  Worry too much - different things 1  Trouble relaxing 0  Restless 0  Easily annoyed or irritable 0  Afraid - awful might happen 0  Total GAD 7 Score 2  Anxiety Difficulty Not difficult at all     Social Functioning Social maturity: Social Maturity: Responsible Social judgement: Social Judgement: Normal  Stress Current stressors: Current Stressors: (Ex-girlfriend has moved on to date someone new.  The ex-girlfriend lives in his apartment complex) Familial stressors: Familial Stressors: None Sleep: Sleep: No problems Appetite: Appetite: No problems Coping ability: Coping ability: Normal  Patient taking medications as prescribed: Patient taking medications as prescribed: Yes  Current medications:  Outpatient Encounter Medications as of 01/07/2018  Medication Sig  . ANDROGEL PUMP 20.25 MG/ACT (1.62%) GEL Apply 2 application topically daily at 2 PM. Apply 1 pump to each shoulder daily in the afternoon  . aspirin EC 81 MG tablet Take 81 mg by mouth every evening.   . brimonidine-timolol (COMBIGAN) 0.2-0.5 % ophthalmic solution Place 1 drop into both eyes 2 (two) times daily.   . citalopram (CELEXA) 40 MG tablet TAKE 1 TABLET BY MOUTH ONCE A DAY.  Marland Kitchen Continuous Blood Gluc Sensor (FREESTYLE LIBRE 14 DAY SENSOR) MISC Inject 1 each into the skin every 14 (fourteen) days. Use as directed.  . cyclobenzaprine (FLEXERIL) 10  MG tablet TAKE (1) TABLET BY MOUTH EVERY EIGHT HOURS AS NEEDED FOR MUSCLE SPASMS.  Marland Kitchen FLUoxetine (PROZAC) 20 MG tablet Take 1 tablet (20 mg total) by mouth daily.  Marland Kitchen gabapentin (NEURONTIN) 600 MG tablet TAKE ONE  TABLET BY MOUTH 3 TIMES DAILY.  Marland Kitchen HUMULIN R U-500 KWIKPEN 500 UNIT/ML kwikpen INJECT UP TO 100 UNITS SUBCUTANEOUSLY 30 MINUTES BEFORE MEALS.  . hydrochlorothiazide (MICROZIDE) 12.5 MG capsule Take 1 capsule (12.5 mg total) by mouth daily.  . Insulin Pen Needle (SURE COMFORT PEN NEEDLES) 31G X 8 MM MISC Inject 1 each into the skin 4 (four) times daily.  Marland Kitchen ketoconazole (NIZORAL) 2 % shampoo Apply 1 application topically 2 (two) times a week.  . lamoTRIgine (LAMICTAL) 200 MG tablet TAKE 1 TABLET BY MOUTH ONCE A DAY.  Marland Kitchen levothyroxine (SYNTHROID, LEVOTHROID) 88 MCG tablet TAKE ONE TABLET BY MOUTH DAILY BEFORE BREAKFAST.  Marland Kitchen lisinopril (PRINIVIL,ZESTRIL) 5 MG tablet TAKE 1 TABLET BY MOUTH ONCE A DAY. (Patient taking differently: TAKE 1 TABLET BY MOUTH ONCE A DAY AT NIGHT)  . metoprolol succinate (TOPROL-XL) 25 MG 24 hr tablet TAKE (1) TABLET BY MOUTH ONCE DAILY.  . Na Sulfate-K Sulfate-Mg Sulf 17.5-3.13-1.6 GM/177ML SOLN Take 1 kit by mouth as directed.  . naproxen (NAPROSYN) 500 MG tablet TAKE ONE TABLET BY MOUTH TWICE DAILY WITH A MEAL.  Marland Kitchen NOVOFINE 30G X 8 MM MISC   . pantoprazole (PROTONIX) 40 MG tablet TAKE (1) TABLET BY MOUTH TWICE DAILY.  . simvastatin (ZOCOR) 40 MG tablet TAKE 1 TABLET BY MOUTH ONCE A DAY.  Marland Kitchen testosterone cypionate (DEPOTESTOSTERONE CYPIONATE) 200 MG/ML injection   . traMADol (ULTRAM) 50 MG tablet TAKE 2 TABLETS BY MOUTH EVERY 8 HOURS AS NEEDED. (Patient taking differently: Take 50-100 mg by mouth 3 (three) times daily as needed (FOR PAIN.). TAKE 2 TABLETS BY MOUTH EVERY 8 HOURS AS NEEDED.)  . TRAVATAN Z 0.004 % SOLN ophthalmic solution Place 1 drop into both eyes at bedtime.   . triamcinolone ointment (KENALOG) 0.1 % APPLY TO AFFECTED AREAS TWICE DAILY. (Patient taking differently: APPLY TO AFFECTED AREAS TWICE DAILY AS NEEDED FOR DRY/ITCHY SKIN.)  . VICTOZA 18 MG/3ML SOPN INJECT 1.8 MG SUBCUTANEOUSLY ONCE DAILY.  Marland Kitchen Vitamin D, Ergocalciferol, (DRISDOL) 50000 units CAPS capsule  TAKE ONE CAPSULE BY MOUTH ONCE WEEKLY.   Facility-Administered Encounter Medications as of January 18, 2018  Medication  . triamcinolone acetonide (KENALOG-40) injection 20 mg    Self-harm Behaviors Risk Assessment Self-harm risk factors: Self-harm risk factors: (None Reported) Patient endorses recent thoughts of harming self: Have you recently had any thoughts about harming yourself?: No  [place] Suicide Severity Rating Scale:  C-SRSS January 18, 2018  1. Wish to be Dead No  2. Suicidal Thoughts No  6. Suicide Behavior Question No    Danger to Others Risk Assessment Danger to others risk factors: Danger to Others Risk Factors: No risk factors noted Patient endorses recent thoughts of harming others: Notification required: No need or identified person  Dynamic Appraisal of Situational Aggression (DASA): No flowsheet data found.  Substance Use Assessment Patient recently consumed alcohol: Have you recently consumed alcohol?: No  Alcohol Use Disorder Identification Test (AUDIT):  Alcohol Use Disorder Test (AUDIT) 01-18-2018  1. How often do you have a drink containing alcohol? 0  2. How many drinks containing alcohol do you have on a typical day when you are drinking? 0  3. How often do you have six or more drinks on one occasion? 0  AUDIT-C Score 0  Intervention/Follow-up AUDIT Score <7 follow-up not indicated   Patient recently used drugs: Have you recently used any drugs?: No  Patient is concerned about dependence or abuse of substances: Does patient seem concerned about dependence or abuse of any substance?: No    Goals, Interventions and Follow-up Plan Goals: Increase healthy adjustment to current life circumstances Interventions: Motivational Interviewing, Solution-Focused Strategies, Mindfulness or Psychologist, educational, Behavioral Activation, Brief CBT and Supportive Counseling Follow-up Plan: [facility] Phone Follow up   Summary of Clinical Assessment Summary:   Patient is a 62 year old  male.  Patient was referred to [facility] services due to emotional state since his girl friend broke up with him and met someone new.  Patient reports that since his PCP has adjusted his medication he ha snot had any depressive episodes.   Patient reports improved mood and he does not think abuot his ex-girlfriend as much.  Patient reports that he has a dog,Abby" for companionship.  Patient denies SI/HI/Psychosis/Substance Abuse.  Patient denies a history of violence.  Patient reports that he lives alone and he does not have any children.   Patient reports a inpatient psychiatric hospitalization when his father died in 39.  Patient does not remember the name of the facility.  Patient denies prior outpatient therapy.  Patient report that he does not think that therapy is necessary.  Patient reports that he does not mind if [facility] follow up to ensure that things continue to go well with him.   Patient reports that he likes to spend time with his dog for fun.  Patient reports that he has been receiving disability since 2004      Francess Mullen, [place], [place]

## 2018-01-10 ENCOUNTER — Other Ambulatory Visit: Payer: Self-pay | Admitting: "Endocrinology

## 2018-01-10 ENCOUNTER — Encounter (HOSPITAL_COMMUNITY): Payer: Self-pay | Admitting: Psychiatry

## 2018-01-10 ENCOUNTER — Other Ambulatory Visit: Payer: Self-pay | Admitting: Family Medicine

## 2018-01-10 DIAGNOSIS — E291 Testicular hypofunction: Secondary | ICD-10-CM | POA: Diagnosis not present

## 2018-01-10 DIAGNOSIS — E039 Hypothyroidism, unspecified: Secondary | ICD-10-CM | POA: Diagnosis not present

## 2018-01-10 DIAGNOSIS — E1121 Type 2 diabetes mellitus with diabetic nephropathy: Secondary | ICD-10-CM | POA: Diagnosis not present

## 2018-01-11 LAB — COMPLETE METABOLIC PANEL WITH GFR
AG Ratio: 1.7 (calc) (ref 1.0–2.5)
ALBUMIN MSPROF: 3.8 g/dL (ref 3.6–5.1)
ALT: 14 U/L (ref 9–46)
AST: 16 U/L (ref 10–35)
Alkaline phosphatase (APISO): 68 U/L (ref 40–115)
BUN: 16 mg/dL (ref 7–25)
CALCIUM: 9.2 mg/dL (ref 8.6–10.3)
CO2: 27 mmol/L (ref 20–32)
CREATININE: 1.19 mg/dL (ref 0.70–1.25)
Chloride: 107 mmol/L (ref 98–110)
GFR, EST NON AFRICAN AMERICAN: 65 mL/min/{1.73_m2} (ref >=60)
GFR, Est African American: 75 mL/min/{1.73_m2} (ref >=60)
GLOBULIN: 2.2 g/dL (ref 1.9–3.7)
GLUCOSE: 110 mg/dL (ref 65–139)
Potassium: 4.4 mmol/L (ref 3.5–5.3)
SODIUM: 142 mmol/L (ref 135–146)
Total Bilirubin: 0.4 mg/dL (ref 0.2–1.2)
Total Protein: 6 g/dL — ABNORMAL LOW (ref 6.1–8.1)

## 2018-01-11 LAB — HEMOGLOBIN A1C
EAG (MMOL/L): 8.4 (calc)
Hgb A1c MFr Bld: 6.9 % of total Hgb — ABNORMAL HIGH (ref <5.7)
MEAN PLASMA GLUCOSE: 151 (calc)

## 2018-01-11 LAB — T4, FREE: FREE T4: 0.9 ng/dL (ref 0.8–1.8)

## 2018-01-11 LAB — TSH: TSH: 2.73 mIU/L (ref 0.40–4.50)

## 2018-01-12 NOTE — Progress Notes (Signed)
[facility] Psychiatric Consultant Case Review   Summary Nicholas [name] is a 62 y.o. year old male with history of depression, anxiety, type II diabetes, hyperlipidemia, hypertension, PVD, hypogonadism. Per chart, fluoxetine was added to his regimen and the patient reports improvement in his symptoms.   Psychosocial factors: break up with his girlfriend, loss of his father in 2007 On disability, lives by himself  Current Medications Current Outpatient Medications on File Prior to Visit  Medication Sig Dispense Refill  . ANDROGEL PUMP 20.25 MG/ACT (1.62%) GEL Apply 2 application topically daily at 2 PM. Apply 1 pump to each shoulder daily in the afternoon    . aspirin EC 81 MG tablet Take 81 mg by mouth every evening.     . brimonidine-timolol (COMBIGAN) 0.2-0.5 % ophthalmic solution Place 1 drop into both eyes 2 (two) times daily.     . citalopram (CELEXA) 40 MG tablet TAKE 1 TABLET BY MOUTH ONCE A DAY. 90 tablet 0  . Continuous Blood Gluc Sensor (FREESTYLE LIBRE 14 DAY SENSOR) MISC Inject 1 each into the skin every 14 (fourteen) days. Use as directed. 2 each 2  . cyclobenzaprine (FLEXERIL) 10 MG tablet TAKE (1) TABLET BY MOUTH EVERY EIGHT HOURS AS NEEDED FOR MUSCLE SPASMS. 30 tablet 0  . FLUoxetine (PROZAC) 20 MG tablet Take 1 tablet (20 mg total) by mouth daily. 30 tablet 3  . gabapentin (NEURONTIN) 600 MG tablet TAKE ONE TABLET BY MOUTH 3 TIMES DAILY. 270 tablet 0  . HUMULIN R U-500 KWIKPEN 500 UNIT/ML kwikpen INJECT UP TO 100 UNITS SUBCUTANEOUSLY 30 MINUTES BEFORE MEALS. 18 mL 2  . hydrochlorothiazide (MICROZIDE) 12.5 MG capsule Take 1 capsule (12.5 mg total) by mouth daily. 90 capsule 3  . Insulin Pen Needle (SURE COMFORT PEN NEEDLES) 31G X 8 MM MISC Inject 1 each into the skin 4 (four) times daily. 200 each 2  . ketoconazole (NIZORAL) 2 % shampoo Apply 1 application topically 2 (two) times a week. 120 mL 3  . lamoTRIgine (LAMICTAL) 200 MG tablet TAKE 1  TABLET BY MOUTH ONCE A DAY. 30 tablet 0  . levothyroxine (SYNTHROID, LEVOTHROID) 88 MCG tablet TAKE ONE TABLET BY MOUTH DAILY BEFORE BREAKFAST. 30 tablet 2  . lisinopril (PRINIVIL,ZESTRIL) 5 MG tablet TAKE 1 TABLET BY MOUTH ONCE A DAY. (Patient taking differently: TAKE 1 TABLET BY MOUTH ONCE A DAY AT NIGHT) 30 tablet 0  . metoprolol succinate (TOPROL-XL) 25 MG 24 hr tablet TAKE (1) TABLET BY MOUTH ONCE DAILY. 90 tablet 0  . Na Sulfate-K Sulfate-Mg Sulf 17.5-3.13-1.6 GM/177ML SOLN Take 1 kit by mouth as directed. 1 Bottle 0  . naproxen (NAPROSYN) 500 MG tablet TAKE ONE TABLET BY MOUTH TWICE DAILY WITH A MEAL. 60 tablet 0  . NOVOFINE 30G X 8 MM MISC     . pantoprazole (PROTONIX) 40 MG tablet TAKE (1) TABLET BY MOUTH TWICE DAILY. 180 tablet 3  . simvastatin (ZOCOR) 40 MG tablet TAKE 1 TABLET BY MOUTH ONCE A DAY. 90 tablet 0  . testosterone cypionate (DEPOTESTOSTERONE CYPIONATE) 200 MG/ML injection     . traMADol (ULTRAM) 50 MG tablet TAKE 2 TABLETS BY MOUTH EVERY 8 HOURS AS NEEDED. (Patient taking differently: Take 50-100 mg by mouth 3 (three) times daily as needed (FOR PAIN.). TAKE 2 TABLETS BY MOUTH EVERY 8 HOURS AS NEEDED.) 180 tablet 0  . TRAVATAN Z 0.004 % SOLN ophthalmic solution Place 1 drop into both eyes at bedtime.     . triamcinolone  ointment (KENALOG) 0.1 % APPLY TO AFFECTED AREAS TWICE DAILY. (Patient taking differently: APPLY TO AFFECTED AREAS TWICE DAILY AS NEEDED FOR DRY/ITCHY SKIN.) 454 g 0  . VICTOZA 18 MG/3ML SOPN INJECT 1.8 MG SUBCUTANEOUSLY ONCE DAILY. 9 mL 2  . Vitamin D, Ergocalciferol, (DRISDOL) 50000 units CAPS capsule TAKE ONE CAPSULE BY MOUTH ONCE WEEKLY. 12 capsule 0   Current Facility-Administered Medications on File Prior to Visit  Medication Dose Route Frequency Provider Last Rate Last Dose  . triamcinolone acetonide (KENALOG-40) injection 20 mg  20 mg Other Once Landis Martins, DPM         Past psychiatry history Outpatient: denies Psychiatry admission: in 2007 in  the context of loss of his father  Previous suicide attempt: denies Past trials of medication: citalopram, fluoxetine History of violence: denies   Current measures Depression screen [facility] 2/9 01/07/2018 12/24/2017 12/20/2017 10/18/2017 09/23/2017  Decreased Interest '2 1 1 3 '$ 0  Down, Depressed, Hopeless 2 0 1 3 0  PHQ - 2 Score '4 1 2 6 '$ 0  Altered sleeping 1 1 0 1 -  Tired, decreased energy '1 2 3 3 '$ -  Change in appetite 0 2 0 1 -  Feeling bad or failure about yourself  0 0 0 1 -  Trouble concentrating 0 0 0 1 -  Moving slowly or fidgety/restless 0 2 0 2 -  Suicidal thoughts 0 0 0 0 -  PHQ-9 Score '6 8 5 15 '$ -  Difficult doing work/chores Not difficult at all Not difficult at all Somewhat difficult Somewhat difficult -  Some recent data might be hidden   GAD 7 : Generalized Anxiety Score 01/07/2018  Nervous, Anxious, on Edge 1  Control/stop worrying 0  Worry too much - different things 1  Trouble relaxing 0  Restless 0  Easily annoyed or irritable 0  Afraid - awful might happen 0  Total GAD 7 Score 2  Anxiety Difficulty Not difficult at all    Goals (patient centered) Increase healthy adjustment to current life circumstances   Assessment/Provisional Diagnosis # MDD Would recommend monotherapy with antidepressant to avoid polypharmacy/serotonin syndrome. Noted that recommended citalopram maximum dose for people >age 79 is 20 mg daily given potential risk of QTc prolongation. Would recommend judicious use of fluoxetine given its potential interaction with metoprolol. Although the patient is on lamotrigine, the indication is not clear from the record. Would recommend slow tapering off this medication if it is used for mood unless he has any episode of (hypo) manic episode.  Recommendation - Would recommend antidepressant monotherapy to target depression.  (Patient is on fluoxetine 20 mg daily, citalopram 40 mg daily, lamotrigine 200 mg daily)  - BH specialist to coach behavioral  activation  Thank you for your consult. We will continue to follow the patient. Please contact [place]  for any questions or concerns.   The above treatment considerations and suggestions are based on consultation with the [facility] specialist and/or PCP and a review of information available in the shared registry and the patient's [place] Record (EHR). I have not personally examined the patient. All recommendations should be implemented with consideration of the patient's relevant prior history and current clinical status. Please feel free to call me with any questions about the care of this patient.

## 2018-01-12 NOTE — Telephone Encounter (Signed)
This encounter was created in error - please disregard.

## 2018-01-15 ENCOUNTER — Other Ambulatory Visit: Payer: Self-pay | Admitting: Family Medicine

## 2018-01-17 ENCOUNTER — Other Ambulatory Visit: Payer: Self-pay | Admitting: Family Medicine

## 2018-01-21 ENCOUNTER — Telehealth: Payer: Self-pay | Admitting: Family Medicine

## 2018-01-21 MED ORDER — BUPROPION HCL ER (XL) 150 MG PO TB24
150.0000 mg | ORAL_TABLET | Freq: Every day | ORAL | 3 refills | Status: DC
Start: 2018-01-21 — End: 2018-05-19

## 2018-01-21 NOTE — Telephone Encounter (Signed)
Pt aware.

## 2018-01-21 NOTE — Telephone Encounter (Signed)
I attempted to contact pt on both phones listed to advise him that his fluoxetine is being discontinued and he will start wellbutrin 150 mg once daily in its place , he is to continue citalopram 40 mg one daily This is because citaolpram and fluoxetine are very similar , so it is like duplicating the medication therefore ineffective

## 2018-01-24 ENCOUNTER — Ambulatory Visit: Payer: Medicare HMO | Admitting: Family Medicine

## 2018-01-25 ENCOUNTER — Encounter: Payer: Self-pay | Admitting: "Endocrinology

## 2018-01-25 ENCOUNTER — Ambulatory Visit: Payer: Medicare HMO | Admitting: "Endocrinology

## 2018-01-25 VITALS — BP 146/70 | HR 70 | Ht 72.0 in | Wt >= 6400 oz

## 2018-01-25 DIAGNOSIS — E039 Hypothyroidism, unspecified: Secondary | ICD-10-CM

## 2018-01-25 DIAGNOSIS — I10 Essential (primary) hypertension: Secondary | ICD-10-CM | POA: Diagnosis not present

## 2018-01-25 DIAGNOSIS — E782 Mixed hyperlipidemia: Secondary | ICD-10-CM | POA: Diagnosis not present

## 2018-01-25 DIAGNOSIS — E1121 Type 2 diabetes mellitus with diabetic nephropathy: Secondary | ICD-10-CM

## 2018-01-25 MED ORDER — HYDROCHLOROTHIAZIDE 25 MG PO TABS: 25.0000 mg | ORAL_TABLET | Freq: Every day | ORAL | 2 refills | Status: DC

## 2018-01-25 MED ORDER — LEVOTHYROXINE SODIUM 100 MCG PO TABS: 100.0000 ug | ORAL_TABLET | Freq: Every day | ORAL | 3 refills | Status: DC

## 2018-01-25 MED ORDER — LISINOPRIL 10 MG PO TABS: 10.0000 mg | ORAL_TABLET | Freq: Every day | ORAL | 2 refills | Status: DC

## 2018-01-25 NOTE — Progress Notes (Signed)
Endocrinology follow-up note  Subjective:    Patient ID: Nicholas [name], male    DOB: Apr 19, 1956,    Past Medical History:  Diagnosis Date  . Chronic knee pain   . Depression   . Diabetes ([place])   . Glaucoma   . Hyperlipidemia   . Hypertension   . MRSA (methicillin resistant Staphylococcus aureus)    patient denies  . Neuropathy   . Poor circulation    leg   . Suicide attempt ([place])   . Testosterone deficiency    Past Surgical History:  Procedure Laterality Date  . CHOLECYSTECTOMY N/A 02/25/2015   Procedure: LAPAROSCOPIC CHOLECYSTECTOMY;  Surgeon: Aviva Signs, MD;  Location: [facility];  Service: General;  Laterality: N/A;  . COLONOSCOPY  03/2008   VPX:TGGYIR external hemorrhoidal tag, otherwise normal rectum/Two diminutive rectosigmoid polyps s/p bx/ Polyp in the opposite the ileocecal valve s/p bx. tubular adenoma.  . COLONOSCOPY N/A 03/23/2014   SWN:[id number] colon polyps throughout his rectum and colon. Sessile and pedunculated. The largest polyp was proximally 6 mm in dimensions in the retum at 3 cm with adjacent diminutive polyp.  There was also a polyps on ileocecal valve, ascending segmen descending and sigmoid segment. as the colon was tortuous & elongated requiring externa abd pressure and changing of the pts postion to reach cecum  . COLONOSCOPY WITH PROPOFOL N/A 07/29/2017   Procedure: COLONOSCOPY WITH PROPOFOL;  Surgeon: Daneil Dolin, MD;  Location: [facility];  Service: Endoscopy;  Laterality: N/A;  12:30pm  . ESOPHAGOGASTRODUODENOSCOPY N/A 03/23/2014   RMR: 1. Patient had circumferential distal esophageal erosions within 5 mm of the GE junction.  No Barrett's esophagus. Tubular esophagus patent throughout its course. 2.  Diffuse gastric erosions. (1) 53m area of healing ulceration in the antrum. No ulcer or infiltrating process observed. Patent pylorus. Normal-appearing first and second portion of the duodenum .  . kidney stones     cysto with litholapexy  .  POLYPECTOMY  07/29/2017   Procedure: POLYPECTOMY;  Surgeon: RDaneil Dolin MD;  Location: [facility];  Service: Endoscopy;;  colon    Social History   Socioeconomic History  . Marital status: Divorced    Spouse name: Not on file  . Number of children: 0  . Years of education: 171 . Highest education level: Not on file  Occupational History  . Occupation: DISABLED    Employer: UNEMPLOYED  Social Needs  . Financial resource strain: Not on file  . Food insecurity:    Worry: Sometimes true    Inability: Sometimes true  . Transportation needs:    Medical: No    Non-medical: No  Tobacco Use  . Smoking status: Former Smoker    Packs/day: 1.50    Years: 25.00    Pack years: 37.50    Types: Cigarettes    Last attempt to quit: 02/20/2002    Years since quitting: 15.9  . Smokeless tobacco: Former [employer] developer   Types: Snuff    Quit date: 11/02/1980  . Tobacco comment: Quit x 15 years  Substance and Sexual Activity  . Alcohol use: No  . Drug use: No  . Sexual activity: Not Currently    Birth control/protection: None  Lifestyle  . Physical activity:    Days per week: 0 days    Minutes per session: 0 min  . Stress: To some extent  Relationships  . Social connections:    Talks on phone: More than three times a week  Gets together: More than three times a week    Attends religious service: More than 4 times per year    Active member of club or organization: Yes    Attends meetings of clubs or organizations: More than 4 times per year    Relationship status: Divorced  Other Topics Concern  . Not on file  Social History Narrative  . Not on file   Outpatient Encounter Medications as of 01/25/2018  Medication Sig  . aspirin EC 81 MG tablet Take 81 mg by mouth every evening.   . brimonidine-timolol (COMBIGAN) 0.2-0.5 % ophthalmic solution Place 1 drop into both eyes 2 (two) times daily.   Marland Kitchen buPROPion (WELLBUTRIN XL) 150 MG 24 hr tablet Take 1 tablet (150 mg total) by mouth daily.   . citalopram (CELEXA) 40 MG tablet TAKE 1 TABLET BY MOUTH ONCE A DAY.  Marland Kitchen Continuous Blood Gluc Sensor (FREESTYLE LIBRE 14 DAY SENSOR) MISC Inject 1 each into the skin every 14 (fourteen) days. Use as directed.  . cyclobenzaprine (FLEXERIL) 10 MG tablet TAKE (1) TABLET BY MOUTH EVERY EIGHT HOURS AS NEEDED FOR MUSCLE SPASMS.  Marland Kitchen gabapentin (NEURONTIN) 600 MG tablet TAKE ONE TABLET BY MOUTH 3 TIMES DAILY.  Marland Kitchen HUMULIN R U-500 KWIKPEN 500 UNIT/ML kwikpen INJECT UP TO 100 UNITS SUBCUTANEOUSLY 30 MINUTES BEFORE MEALS.  . hydrochlorothiazide (MICROZIDE) 12.5 MG capsule Take 1 capsule (12.5 mg total) by mouth daily.  . Insulin Pen Needle (SURE COMFORT PEN NEEDLES) 31G X 8 MM MISC Inject 1 each into the skin 4 (four) times daily.  Marland Kitchen ketoconazole (NIZORAL) 2 % shampoo Apply 1 application topically 2 (two) times a week.  . lamoTRIgine (LAMICTAL) 200 MG tablet TAKE 1 TABLET BY MOUTH ONCE A DAY.  Marland Kitchen levothyroxine (SYNTHROID, LEVOTHROID) 100 MCG tablet Take 1 tablet (100 mcg total) by mouth daily before breakfast.  . lisinopril (PRINIVIL,ZESTRIL) 5 MG tablet TAKE 1 TABLET BY MOUTH ONCE A DAY. (Patient taking differently: TAKE 1 TABLET BY MOUTH ONCE A DAY AT NIGHT)  . metoprolol succinate (TOPROL-XL) 25 MG 24 hr tablet TAKE (1) TABLET BY MOUTH ONCE DAILY.  . Na Sulfate-K Sulfate-Mg Sulf 17.5-3.13-1.6 GM/177ML SOLN Take 1 kit by mouth as directed.  . naproxen (NAPROSYN) 500 MG tablet TAKE ONE TABLET BY MOUTH TWICE DAILY WITH A MEAL.  Marland Kitchen NOVOFINE 30G X 8 MM MISC   . pantoprazole (PROTONIX) 40 MG tablet TAKE (1) TABLET BY MOUTH TWICE DAILY.  . simvastatin (ZOCOR) 40 MG tablet TAKE 1 TABLET BY MOUTH ONCE A DAY.  Marland Kitchen testosterone cypionate (DEPOTESTOSTERONE CYPIONATE) 200 MG/ML injection   . traMADol (ULTRAM) 50 MG tablet TAKE 2 TABLETS BY MOUTH EVERY 8 HOURS AS NEEDED. (Patient taking differently: Take 50-100 mg by mouth 3 (three) times daily as needed (FOR PAIN.). TAKE 2 TABLETS BY MOUTH EVERY 8 HOURS AS NEEDED.)  .  TRAVATAN Z 0.004 % SOLN ophthalmic solution Place 1 drop into both eyes at bedtime.   . triamcinolone ointment (KENALOG) 0.1 % APPLY TO AFFECTED AREAS TWICE DAILY. (Patient taking differently: APPLY TO AFFECTED AREAS TWICE DAILY AS NEEDED FOR DRY/ITCHY SKIN.)  . VICTOZA 18 MG/3ML SOPN INJECT 1.8 MG SUBCUTANEOUSLY ONCE DAILY.  Marland Kitchen Vitamin D, Ergocalciferol, (DRISDOL) 50000 units CAPS capsule TAKE ONE CAPSULE BY MOUTH ONCE WEEKLY.  . [DISCONTINUED] levothyroxine (SYNTHROID, LEVOTHROID) 88 MCG tablet TAKE ONE TABLET BY MOUTH DAILY BEFORE BREAKFAST.  Marland Kitchen ANDROGEL PUMP 20.25 MG/ACT (1.62%) GEL Apply 2 application topically daily at 2 PM. Apply 1 pump to each  shoulder daily in the afternoon  . FLUoxetine (PROZAC) 20 MG capsule TAKE 1 CAPSULE BY MOUTH DAILY. (Patient not taking: Reported on 01/25/2018)   Facility-Administered Encounter Medications as of 01/25/2018  Medication  . triamcinolone acetonide (KENALOG-40) injection 20 mg   ALLERGIES: Allergies  Allergen Reactions  . No Allergies On File   . No Known Allergies    VACCINATION STATUS: Immunization History  Administered Date(s) Administered  . Influenza Whole 04/01/2010  . Influenza,inj,Quad PF,6+ Mos 03/08/2013  . Pneumococcal Conjugate-13 07/09/2014  . Pneumococcal Polysaccharide-23 04/01/2010, 10/07/2015  . Tdap 11/24/2011  . Zoster 12/12/2014    Diabetes  He presents for his follow-up diabetic visit. He has type 2 diabetes mellitus. His disease course has been worsening. There are no hypoglycemic associated symptoms. Pertinent negatives for hypoglycemia include no confusion, headaches, pallor or seizures. There are no diabetic associated symptoms. Pertinent negatives for diabetes include no chest pain, no fatigue, no polydipsia, no polyphagia, no polyuria and no weakness. There are no hypoglycemic complications. Symptoms are improving. Diabetic complications include impotence, peripheral neuropathy and PVD. Risk factors for coronary artery  disease include diabetes mellitus, dyslipidemia, family history, hypertension, male sex, obesity, sedentary lifestyle and tobacco exposure. He is compliant with treatment most of the time. His weight is increasing steadily. He is following a generally unhealthy diet. He never participates in exercise. His home blood glucose trend is decreasing steadily. His breakfast blood glucose range is generally 140-180 mg/dl. His lunch blood glucose range is generally 140-180 mg/dl. His dinner blood glucose range is generally 140-180 mg/dl. His overall blood glucose range is 140-180 mg/dl. An ACE inhibitor/angiotensin II receptor blocker is being taken. Eye exam is current.  Hyperlipidemia  This is a chronic problem. The current episode started more than 1 year ago. The problem is controlled. Exacerbating diseases include diabetes and obesity. Pertinent negatives include no chest pain, myalgias or shortness of breath. Current antihyperlipidemic treatment includes statins. Risk factors for coronary artery disease include a sedentary lifestyle, male sex, diabetes mellitus, dyslipidemia and obesity.  Hypertension  The current episode started more than 1 year ago. The problem is controlled. Pertinent negatives include no chest pain, headaches, neck pain, palpitations or shortness of breath. Risk factors for coronary artery disease include dyslipidemia, diabetes mellitus, obesity, sedentary lifestyle and smoking/tobacco exposure. Past treatments include ACE inhibitors. Hypertensive end-organ damage includes PVD.     Review of Systems  Constitutional: Negative for fatigue and unexpected weight change.  HENT: Negative for dental problem, mouth sores and trouble swallowing.   Eyes: Negative for visual disturbance.  Respiratory: Negative for cough, choking, chest tightness, shortness of breath and wheezing.   Cardiovascular: Negative for chest pain, palpitations and leg swelling.  Gastrointestinal: Negative for abdominal  distention, abdominal pain, constipation, diarrhea, nausea and vomiting.  Endocrine: Negative for polydipsia, polyphagia and polyuria.  Genitourinary: Positive for impotence. Negative for dysuria, flank pain, hematuria and urgency.  Musculoskeletal: Negative for back pain, gait problem, myalgias and neck pain.  Skin: Negative for pallor, rash and wound.  Neurological: Negative for seizures, syncope, weakness, numbness and headaches.  Psychiatric/Behavioral: Negative.  Negative for confusion and dysphoric mood.    Objective:    BP (!) 146/70   Pulse 70   Ht 6' (1.829 m)   Wt (!) 455 lb (206.4 kg)   SpO2 95%   BMI 61.71 kg/m   Wt Readings from Last 3 Encounters:  01/25/18 (!) 455 lb (206.4 kg)  12/24/17 (!) 438 lb (198.7 kg)  12/20/17 (!) 432  lb (196 kg)    Physical Exam  Constitutional: He is oriented to person, place, and time. He appears well-developed. He is cooperative. No distress.  HENT:  Head: Normocephalic and atraumatic.  Eyes: EOM are normal.  Neck: Normal range of motion. Neck supple. No tracheal deviation present. No thyromegaly present.  Cardiovascular: Normal rate, S1 normal and S2 normal. Exam reveals no gallop.  No murmur heard. Pulses:      Dorsalis pedis pulses are 0 on the right side, and 0 on the left side.       Posterior tibial pulses are 0 on the right side, and 0 on the left side.  Pulmonary/Chest: Effort normal. No respiratory distress. He has no wheezes.  Abdominal: He exhibits no distension. There is no tenderness. There is no guarding and no CVA tenderness.  + Obese.  Musculoskeletal: He exhibits no edema.       Right shoulder: He exhibits no swelling and no deformity.  Large lower extremities with skin dryness, nonexistent to poor circulation.  Neurological: He is alert and oriented to person, place, and time. He has normal strength and normal reflexes. No cranial nerve deficit or sensory deficit. Gait normal.  Skin: Skin is warm and dry. No rash  noted. No cyanosis. Nails show no clubbing.  He has extensive tattoos.  Psychiatric: He has a normal mood and affect. His speech is normal. Cognition and memory are normal.    Results for orders placed or performed in visit on 11/26/17  HM DIABETES EYE EXAM  Result Value Ref Range   HM Diabetic Eye Exam No Retinopathy No Retinopathy  Diabetic Labs (most recent): Lab Results  Component Value Date   HGBA1C 6.9 (H) 01/10/2018   HGBA1C 7.6 (H) 09/14/2017   HGBA1C 8.0 (H) 05/26/2017   Lipid Panel     Component Value Date/Time   CHOL 164 06/01/2017 0759   TRIG 181 (H) 06/01/2017 0759   HDL 50 06/01/2017 0759   CHOLHDL 3.3 06/01/2017 0759   VLDL 35 (H) 08/11/2016 0748   LDLCALC 86 06/01/2017 0759    Assessment & Plan:   1. Type 2 diabetes mellitus with pressure callus ([facility])  His diabetes is  complicated by morbid obesity/sedentary life, PAD, peripheral neuropathy, with history of of diabetic foot ulcer.  Patient came with Improved glycemic profile on U500 insulin taking 3 times a day before meals. He came with improved A1c of 6.9%, improving from 7.6%.    -  No documented nor reported hypoglycemia.  - Patient remains at extremely high risk for more acute and chronic complications of diabetes which include CAD, CVA, CKD, retinopathy, and neuropathy. These are all discussed in detail with the patient.  - I have re-counseled the patient on diet management and weight loss  by adopting a carbohydrate restricted / protein rich  Diet. - Patient is advised to stick to a routine mealtimes to eat 3 meals  a day and avoid unnecessary snacks ( to snack only to correct hypoglycemia).  -He admits to dietary indiscretion which is causing significant weight gain. -  Suggestion is made for him to avoid simple carbohydrates  from his diet including Cakes, Sweet Desserts / Pastries, Ice Cream, Soda (diet and regular), Sweet Tea, Candies, Chips, Cookies, Store Bought Juices, Alcohol in Excess of   1-2 drinks a day, Artificial Sweeteners, and "Sugar-free" Products. This will help patient to have stable blood glucose profile and potentially avoid unintended weight gain.  - I have approached patient with the  following individualized plan to manage diabetes and patient agrees.  -Given his presentation with controlled glycemia to target, he will be continued on the same insulin program involving  Humulin U500    80 units before breakfast, 70 units before lunch, and 70 units before supper  for pre-meal blood glucose readings above 90 mg/dL associated with strict monitoring of glucose 4 times a day-before meals and at bedtime. -His insurance did not provide coverage for his CGM device. - Patient is warned not to take insulin without proper monitoring per orders. -Patient is encouraged to call clinic for blood glucose levels less than 70 or above 300 mg /dl. - I advised him to continue Victoza 1.8 mg subcutaneous daily, therapeutically suitable for patient. -Patient does not tolerate metformin. - Patient specific target  for A1c; LDL, HDL, Triglycerides, and  Waist Circumference were discussed in detail.  2) BP/HTN: His blood pressure is not controlled to target.  I have advised him to  continue his current blood pressure medications including lisinopril.  I will increase his lisinopril to 10 mg p.o. daily, and increase his hydrochlorothiazide to 25 mg p.o. daily.     3) Lipids/HPL: His lipid profile is controlled LDL at 82.  He is advised to continue simvastatin 40 mg p.o. Nightly.  4)  Weight/Diet: CDE consult in progress, he cannot exercise optimally.     5) hypothyroidism:   - His thyroid function tests are consistent with appropriate replacement.  However, he would benefit from slight increase in his levothyroxine dose.  -I discussed and increase his levothyroxine to 100 mcg p.o. every morning.   - We discussed about correct intake of levothyroxine, at fasting, with water, separated by at  least 30 minutes from breakfast, and separated by more than 4 hours from calcium, iron, multivitamins, acid reflux medications (PPIs). -Patient is made aware of the fact that thyroid hormone replacement is needed for life, dose to be adjusted by periodic monitoring of thyroid function tests.  6) Chronic Care/Health Maintenance:  -Patient is on ACEI and Statin medications and encouraged to continue to follow up with Ophthalmology, Podiatrist at least yearly or according to recommendations, and advised to  stay away from smoking. I have recommended yearly flu vaccine and pneumonia vaccination at least every 5 years; moderate intensity exercise for up to 150 minutes weekly; and  sleep for at least 7 hours a day.  I advised patient to maintain close follow up with his PCP for primary care needs.  - Time spent with the patient: 25 min, of which >50% was spent in reviewing his blood glucose logs , discussing his hypo- and hyper-glycemic episodes, reviewing his current and  previous labs and insulin doses and developing a plan to avoid hypo- and hyper-glycemia. Please refer to Patient Instructions for Blood Glucose Monitoring and Insulin/Medications Dosing Guide"  in media tab for additional information. Nicholas [name] participated in the discussions, expressed understanding, and voiced agreement with the above plans.  All questions were answered to his satisfaction. he is encouraged to contact clinic should he have any questions or concerns prior to his return visit.   Follow up plan: Return in about 3 months (around 04/27/2018) for meter, and logs, follow up with pre-visit labs, meter, and logs.  Glade Lloyd, MD Phone: [phone]  Fax: [phone]  This note was partially dictated with voice recognition software. Similar sounding words can be transcribed inadequately or may not  be corrected upon review.  01/25/2018, 5:50 PM

## 2018-01-25 NOTE — Patient Instructions (Signed)

## 2018-01-29 ENCOUNTER — Other Ambulatory Visit: Payer: Self-pay | Admitting: "Endocrinology

## 2018-01-31 ENCOUNTER — Telehealth: Payer: Self-pay

## 2018-01-31 NOTE — Telephone Encounter (Signed)
[facility] - Left Msg 

## 2018-02-03 ENCOUNTER — Telehealth: Payer: Self-pay

## 2018-02-03 NOTE — Telephone Encounter (Signed)
2nd attempt - [facility]   

## 2018-02-08 ENCOUNTER — Other Ambulatory Visit: Payer: Self-pay | Admitting: "Endocrinology

## 2018-02-08 DIAGNOSIS — E291 Testicular hypofunction: Secondary | ICD-10-CM | POA: Diagnosis not present

## 2018-02-14 ENCOUNTER — Other Ambulatory Visit: Payer: Self-pay | Admitting: Family Medicine

## 2018-02-15 ENCOUNTER — Telehealth: Payer: Self-pay | Admitting: Family Medicine

## 2018-02-15 NOTE — Telephone Encounter (Signed)
New Message  Pt verbalized he is having problems with his back and he is wanting to speak to the nurse in regards to it.  Please f/u

## 2018-02-16 ENCOUNTER — Ambulatory Visit: Payer: Medicare HMO | Admitting: Urology

## 2018-02-16 DIAGNOSIS — E291 Testicular hypofunction: Secondary | ICD-10-CM | POA: Diagnosis not present

## 2018-02-16 NOTE — Telephone Encounter (Signed)
New Message  Pt called back returning nurses call.  Please f/u

## 2018-02-16 NOTE — Telephone Encounter (Signed)
Called patient to discuss message. No answer. lvm requesting call back.

## 2018-02-17 NOTE — Telephone Encounter (Signed)
Spoke with patient and he states that his back has been hurting off and on. When it hurts, it really hurts bad. Hard for him to walk long distances. Scheduled patient to see Dr.Simpson 03/01/18 at 2:20pm with verbal understanding. This appt time and date ok'ed by patient.

## 2018-02-17 NOTE — Telephone Encounter (Signed)
He has had toradol and depo medrol IM in office before for  Back pain, if he wants that ands a prednisone script , I can see him sooner than September, can be worked in next week, actually there is availability for sooner appointment next week

## 2018-02-17 NOTE — Telephone Encounter (Signed)
Called patient and let him know he can be seen earlier if he would like for his back pain. Appointment moved up to 8/27. Patient is ok with this time and date as well.

## 2018-02-22 ENCOUNTER — Encounter: Payer: Self-pay | Admitting: Family Medicine

## 2018-02-22 ENCOUNTER — Ambulatory Visit (INDEPENDENT_AMBULATORY_CARE_PROVIDER_SITE_OTHER): Payer: Medicare HMO | Admitting: Family Medicine

## 2018-02-22 VITALS — BP 128/80 | HR 77 | Resp 17 | Ht 72.0 in | Wt >= 6400 oz

## 2018-02-22 DIAGNOSIS — M5440 Lumbago with sciatica, unspecified side: Secondary | ICD-10-CM | POA: Insufficient documentation

## 2018-02-22 DIAGNOSIS — E1121 Type 2 diabetes mellitus with diabetic nephropathy: Secondary | ICD-10-CM | POA: Diagnosis not present

## 2018-02-22 DIAGNOSIS — I10 Essential (primary) hypertension: Secondary | ICD-10-CM

## 2018-02-22 DIAGNOSIS — M5442 Lumbago with sciatica, left side: Secondary | ICD-10-CM | POA: Diagnosis not present

## 2018-02-22 MED ORDER — KETOROLAC TROMETHAMINE 60 MG/2ML IM SOLN
60.0000 mg | Freq: Once | INTRAMUSCULAR | Status: AC
Start: 2018-02-22 — End: 2018-02-22
  Administered 2018-02-22: 60 mg via INTRAMUSCULAR

## 2018-02-22 MED ORDER — PREDNISONE 20 MG PO TABS: ORAL_TABLET | ORAL | 0 refills | Status: DC

## 2018-02-22 MED ORDER — IBUPROFEN 800 MG PO TABS: 800.0000 mg | ORAL_TABLET | Freq: Three times a day (TID) | ORAL | 0 refills | Status: DC

## 2018-02-22 MED ORDER — RANITIDINE HCL 150 MG PO CAPS: 150.0000 mg | ORAL_CAPSULE | Freq: Two times a day (BID) | ORAL | 0 refills | Status: DC

## 2018-02-22 MED ORDER — METHYLPREDNISOLONE ACETATE 80 MG/ML IJ SUSP
120.0000 mg | Freq: Once | INTRAMUSCULAR | Status: AC
  Administered 2018-02-22: 120 mg via INTRAMUSCULAR

## 2018-02-22 NOTE — Assessment & Plan Note (Signed)
Uncontrolled.Toradol and depo medrol administered IM in the office , to be followed by a short course of oral prednisone and NSAIDS.  

## 2018-02-22 NOTE — Progress Notes (Signed)
Nicholas [name]     MRN: [id number]      DOB: April 24, 1956   HPI Nicholas [name] is here with a 1 week h/o debilitating low back pain radiating to left groin and thigh, denies any specific aggravating activity, denies weakness or loss of sensation, denies incontinence. States pain was so severe that he nearly called EMS this past weekend ROS Denies recent fever or chills. Denies sinus pressure, nasal congestion, ear pain or sore throat. Denies chest congestion, productive cough or wheezing. Denies chest pains, palpitations and leg swelling Denies abdominal pain, nausea, vomiting,diarrhea or constipation.   Denies dysuria, frequency, hesitancy or incontinence. Denies headaches, seizures, numbness, or tingling. Denies depression, anxiety or insomnia. Denies skin break down or rash.   PE  BP 128/80   Pulse 77   Resp 17   Ht 6' (1.829 m)   Wt (!) 432 lb (196 kg)   SpO2 92%   BMI 58.59 kg/m   Patient alert and oriented and in no cardiopulmonary distress.Pt in pain, tearful  HEENT: No facial asymmetry, EOMI,   oropharynx pink and moist.  Neck supple no JVD, no mass.  Chest: Clear to auscultation bilaterally.  CVS: S1, S2 no murmurs, no S3.Regular rate.  ABD: Soft non tender.   Ext: No edema  MS: markedly decreased  ROM lumbar  spine, adequate in shoulders, hips and knees.  Skin: Intact, no ulcerations or rash noted.  Psych: Good eye contact, normal affect. Memory intact not anxious or depressed appearing.  CNS: CN 2-12 intact, power,  normal throughout.no focal deficits noted.   Assessment & Plan  Low back pain with left-sided sciatica Uncontrolled.Toradol and depo medrol administered IM in the office , to be followed by a short course of oral prednisone and NSAIDS.   MORBID OBESITY Improved, pt applauded on this and is encouraged to continue same Patient re-educated about  the importance of commitment to a  minimum of 150 minutes of exercise per week.  The  importance of healthy food choices with portion control discussed. Encouraged to start a food diary, count calories and to consider  joining a support group. Sample diet sheets offered. Goals set by the patient for the next several months.   Weight /BMI 02/22/2018 01/25/2018 12/24/2017  WEIGHT 432 lb 455 lb 438 lb  HEIGHT 6\' 0"  6\' 0"  5\' 9"   BMI 58.59 kg/m2 61.71 kg/m2 64.68 kg/m2      Benign hypertension Controlled, no change in medication DASH diet and commitment to daily physical activity for a minimum of 30 minutes discussed and encouraged, as a part of hypertension management. The importance of attaining a healthy weight is also discussed.  BP/Weight 02/22/2018 01/25/2018 01/04/2018 12/24/2017 12/20/2017 10/18/2017 3/54/6568  Systolic BP 127 517 001 749 449 675 916  Diastolic BP 80 70 69 78 80 80 77  Wt. (Lbs) 432 455 - 438 432 441 448  BMI 58.59 61.71 - 64.68 63.8 65.12 60.76       Type 2 diabetes mellitus with diabetic nephropathy, without long-term current use of insulin ([place]) Managed by endo and well  controlled  [name] is reminded of the importance of commitment to daily physical activity for 30 minutes or more, as able and the need to limit carbohydrate intake to 30 to 60 grams per meal to help with blood sugar control.   The need to take medication as prescribed, test blood sugar as directed, and to call between visits if there is a concern that blood sugar  is uncontrolled is also discussed.   Mr. Port is reminded of the importance of daily foot exam, annual eye examination, and good blood sugar, blood pressure and cholesterol control.  Diabetic Labs Latest Ref Rng & Units 01/10/2018 09/14/2017 07/23/2017 06/01/2017 05/26/2017  HbA1c <5.7 % of total Hgb 6.9(H) 7.6(H) - - 8.0(H)  Microalbumin <2.0 mg/dL - - - - -  Micro/Creat Ratio 0.0 - 30.0 mg/g - - - - -  Chol <200 mg/dL - - - 164 -  HDL >40 mg/dL - - - 50 -  Calc LDL mg/dL (calc) - - - 86 -  Triglycerides <150  mg/dL - - - 181(H) -  Creatinine 0.70 - 1.25 mg/dL 1.19 1.14 1.13 - 1.31(H)   BP/Weight 02/22/2018 01/25/2018 01/04/2018 12/24/2017 12/20/2017 10/18/2017 09/25/1914  Systolic BP 606 004 599 774 142 395 320  Diastolic BP 80 70 69 78 80 80 77  Wt. (Lbs) 432 455 - 438 432 441 448  BMI 58.59 61.71 - 64.68 63.8 65.12 60.76   Foot/eye exam completion dates Latest Ref Rng & Units 10/12/2017 05/29/2016  Eye Exam No Retinopathy No Retinopathy No Retinopathy  Foot exam Order - - -  Foot Form Completion - - -

## 2018-02-22 NOTE — Assessment & Plan Note (Signed)
Controlled, no change in medication DASH diet and commitment to daily physical activity for a minimum of 30 minutes discussed and encouraged, as a part of hypertension management. The importance of attaining a healthy weight is also discussed.  BP/Weight 02/22/2018 01/25/2018 01/04/2018 12/24/2017 12/20/2017 10/18/2017 9/47/6546  Systolic BP 503 546 568 127 517 001 749  Diastolic BP 80 70 69 78 80 80 77  Wt. (Lbs) 432 455 - 438 432 441 448  BMI 58.59 61.71 - 64.68 63.8 65.12 60.76

## 2018-02-22 NOTE — Assessment & Plan Note (Signed)
Improved, pt applauded on this and is encouraged to continue same Patient re-educated about  the importance of commitment to a  minimum of 150 minutes of exercise per week.  The importance of healthy food choices with portion control discussed. Encouraged to start a food diary, count calories and to consider  joining a support group. Sample diet sheets offered. Goals set by the patient for the next several months.   Weight /BMI 02/22/2018 01/25/2018 12/24/2017  WEIGHT 432 lb 455 lb 438 lb  HEIGHT 6\' 0"  6\' 0"  5\' 9"   BMI 58.59 kg/m2 61.71 kg/m2 64.68 kg/m2

## 2018-02-22 NOTE — Patient Instructions (Addendum)
F/u as before, call if you need me sooner  Two injection sin office today for back pain and 3 medications are sent to your pharmacy, take as directed   Divide ypou feel much better soon  Message sewnt to therapist.  Work on healthy food choice for better health  Thank you  for choosing [facility]. We consider it a privelige to serve you.  Delivering excellent health care in a caring and  compassionate way is our goal.  Partnering with you,  so that together we can achieve this goal is our strategy.       Sciatica Sciatica is pain, numbness, weakness, or tingling along your sciatic nerve. The sciatic nerve starts in the lower back and goes down the back of each leg. Sciatica happens when this nerve is pinched or has pressure put on it. Sciatica usually goes away on its own or with treatment. Sometimes, sciatica may keep coming back (recur). Follow these instructions at home: Medicines  Take over-the-counter and prescription medicines only as told by your doctor.  Do not drive or use heavy machinery while taking prescription pain medicine. Managing pain  If directed, put ice on the affected area. ? Put ice in a plastic bag. ? Place a towel between your skin and the bag. ? Leave the ice on for 20 minutes, 2-3 times a day.  After icing, apply heat to the affected area before you exercise or as often as told by your doctor. Use the heat source that your doctor tells you to use, such as a moist heat pack or a heating pad. ? Place a towel between your skin and the heat source. ? Leave the heat on for 20-30 minutes. ? Remove the heat if your skin turns bright red. This is especially important if you are unable to feel pain, heat, or cold. You may have a greater risk of getting burned. Activity  Return to your normal activities as told by your doctor. Ask your doctor what activities are safe for you. ? Avoid activities that make your sciatica worse.  Take short rests  during the day. Rest in a lying or standing position. This is usually better than sitting to rest. ? When you rest for a long time, do some physical activity or stretching between periods of rest. ? Avoid sitting for a long time without moving. Get up and move around at least one time each hour.  Exercise and stretch regularly, as told by your doctor.  Do not lift anything that is heavier than 10 lb (4.5 kg) while you have symptoms of sciatica. ? Avoid lifting heavy things even when you do not have symptoms. ? Avoid lifting heavy things over and over.  When you lift objects, always lift in a way that is safe for your body. To do this, you should: ? Bend your knees. ? Keep the object close to your body. ? Avoid twisting. General instructions  Use good posture. ? Avoid leaning forward when you are sitting. ? Avoid hunching over when you are standing.  Stay at a healthy weight.  Wear comfortable shoes that support your feet. Avoid wearing high heels.  Avoid sleeping on a mattress that is too soft or too hard. You might have less pain if you sleep on a mattress that is firm enough to support your back.  Keep all follow-up visits as told by your doctor. This is important. Contact a doctor if:  You have pain that: ? Wakes you up  when you are sleeping. ? Gets worse when you lie down. ? Is worse than the pain you have had in the past. ? Lasts longer than 4 weeks.  You lose weight for without trying. Get help right away if:  You cannot control when you pee (urinate) or poop (have a bowel movement).  You have weakness in any of these areas and it gets worse. ? Lower back. ? Lower belly (pelvis). ? Butt (buttocks). ? Legs.  You have redness or swelling of your back.  You have a burning feeling when you pee. This information is not intended to replace advice given to you by your health care provider. Make sure you discuss any questions you have with your health care  provider. Document Released: 03/24/2008 Document Revised: 11/21/2015 Document Reviewed: 02/22/2015 Elsevier Interactive Patient Education  [employer].

## 2018-02-22 NOTE — Assessment & Plan Note (Signed)
Managed by endo and well  controlled  Nicholas [name] is reminded of the importance of commitment to daily physical activity for 30 minutes or more, as able and the need to limit carbohydrate intake to 30 to 60 grams per meal to help with blood sugar control.   The need to take medication as prescribed, test blood sugar as directed, and to call between visits if there is a concern that blood sugar is uncontrolled is also discussed.   Nicholas [name] is reminded of the importance of daily foot exam, annual eye examination, and good blood sugar, blood pressure and cholesterol control.  Diabetic Labs Latest Ref Rng & Units 01/10/2018 09/14/2017 07/23/2017 06/01/2017 05/26/2017  HbA1c <5.7 % of total Hgb 6.9(H) 7.6(H) - - 8.0(H)  Microalbumin <2.0 mg/dL - - - - -  Micro/Creat Ratio 0.0 - 30.0 mg/g - - - - -  Chol <200 mg/dL - - - 164 -  HDL >40 mg/dL - - - 50 -  Calc LDL mg/dL (calc) - - - 86 -  Triglycerides <150 mg/dL - - - 181(H) -  Creatinine 0.70 - 1.25 mg/dL 1.19 1.14 1.13 - 1.31(H)   BP/Weight 02/22/2018 01/25/2018 01/04/2018 12/24/2017 12/20/2017 10/18/2017 8/92/1194  Systolic BP 174 081 448 185 631 497 026  Diastolic BP 80 70 69 78 80 80 77  Wt. (Lbs) 432 455 - 438 432 441 448  BMI 58.59 61.71 - 64.68 63.8 65.12 60.76   Foot/eye exam completion dates Latest Ref Rng & Units 10/12/2017 05/29/2016  Eye Exam No Retinopathy No Retinopathy No Retinopathy  Foot exam Order - - -  Foot Form Completion - - -

## 2018-02-24 ENCOUNTER — Telehealth: Payer: Self-pay

## 2018-02-24 DIAGNOSIS — F341 Dysthymic disorder: Secondary | ICD-10-CM

## 2018-02-24 NOTE — Telephone Encounter (Signed)
[facility] - Unable to leave a message because the voice mailbox is full.

## 2018-02-24 NOTE — BH Specialist Note (Signed)
[place] Telephone Follow-up  MRN: [id number] NAME: Nicholas [name] Date: 02/24/18   Total time: 30 minutes Call number: 2/6  Reason for call today:  [facility] Phone Follow Up   PHQ-9 Scores:  Depression screen [facility] 2/9 02/24/2018 02/22/2018 01/07/2018 12/24/2017 12/20/2017  Decreased Interest 2 0 '2 1 1  '$ Down, Depressed, Hopeless '1 1 2 '$ 0 1  PHQ - 2 Score '3 1 4 1 2  '$ Altered sleeping '3 2 1 1 '$ 0  Tired, decreased energy '1 3 1 2 3  '$ Change in appetite 0 2 0 2 0  Feeling bad or failure about yourself  1 1 0 0 0  Trouble concentrating 0 0 0 0 0  Moving slowly or fidgety/restless 0 3 0 2 0  Suicidal thoughts 0 0 0 0 0  PHQ-9 Score '8 12 6 8 5  '$ Difficult doing work/chores Somewhat difficult Somewhat difficult Not difficult at all Not difficult at all Somewhat difficult  Some recent data might be hidden     GAD-7 Scores:  GAD 7 : Generalized Anxiety Score 02/24/2018 01/07/2018  Nervous, Anxious, on Edge 0 1  Control/stop worrying 0 0  Worry too much - different things 0 1  Trouble relaxing 0 0  Restless 0 0  Easily annoyed or irritable 0 0  Afraid - awful might happen 0 0  Total GAD 7 Score 0 2  Anxiety Difficulty Not difficult at all Not difficult at all    Stress Current stressors: Current Stressors: (His ex-girlfriend is trying to talk to him again.) Sleep: Sleep: Decreased(Unable to sleep due to back pain.) Appetite: Appetite: No problems Coping ability: Coping ability: Overwhelmed  Patient taking medications as prescribed: Patient taking medications as prescribed: Yes  Current medications:  Outpatient Encounter Medications as of 02/24/2018  Medication Sig  . ANDROGEL PUMP 20.25 MG/ACT (1.62%) GEL Apply 2 application topically daily at 2 PM. Apply 1 pump to each shoulder daily in the afternoon  . aspirin EC 81 MG tablet Take 81 mg by mouth every evening.   . brimonidine-timolol (COMBIGAN) 0.2-0.5 % ophthalmic solution Place 1 drop into both eyes 2 (two) times daily.    Marland Kitchen buPROPion (WELLBUTRIN XL) 150 MG 24 hr tablet Take 1 tablet (150 mg total) by mouth daily.  . citalopram (CELEXA) 40 MG tablet TAKE 1 TABLET BY MOUTH ONCE A DAY.  Marland Kitchen Continuous Blood Gluc Sensor (FREESTYLE LIBRE 14 DAY SENSOR) MISC Inject 1 each into the skin every 14 (fourteen) days. Use as directed.  Marland Kitchen FLUoxetine (PROZAC) 20 MG capsule TAKE 1 CAPSULE BY MOUTH DAILY. (Patient not taking: Reported on 01/25/2018)  . gabapentin (NEURONTIN) 600 MG tablet TAKE ONE TABLET BY MOUTH 3 TIMES DAILY.  Marland Kitchen HUMULIN R U-500 KWIKPEN 500 UNIT/ML kwikpen INJECT UP TO 100 UNITS SUBCUTANEOUSLY 30 MINUTES BEFORE MEALS.  . hydrochlorothiazide (HYDRODIURIL) 25 MG tablet Take 1 tablet (25 mg total) by mouth daily.  Marland Kitchen ibuprofen (ADVIL,MOTRIN) 800 MG tablet Take 1 tablet (800 mg total) by mouth 3 (three) times daily.  . Insulin Pen Needle (SURE COMFORT PEN NEEDLES) 31G X 8 MM MISC Inject 1 each into the skin 4 (four) times daily.  Marland Kitchen ketoconazole (NIZORAL) 2 % shampoo Apply 1 application topically 2 (two) times a week.  . lamoTRIgine (LAMICTAL) 200 MG tablet TAKE 1 TABLET BY MOUTH ONCE A DAY.  Marland Kitchen levothyroxine (SYNTHROID, LEVOTHROID) 100 MCG tablet Take 1 tablet (100 mcg total) by mouth daily before breakfast.  . lisinopril (PRINIVIL,ZESTRIL) 10 MG tablet  Take 1 tablet (10 mg total) by mouth daily.  . metoprolol succinate (TOPROL-XL) 25 MG 24 hr tablet TAKE (1) TABLET BY MOUTH ONCE DAILY.  . Na Sulfate-K Sulfate-Mg Sulf 17.5-3.13-1.6 GM/177ML SOLN Take 1 kit by mouth as directed. (Patient not taking: Reported on 02/22/2018)  . naproxen (NAPROSYN) 500 MG tablet TAKE ONE TABLET BY MOUTH TWICE DAILY WITH A MEAL.  Marland Kitchen NOVOFINE 30G X 8 MM MISC   . pantoprazole (PROTONIX) 40 MG tablet TAKE (1) TABLET BY MOUTH TWICE DAILY.  Marland Kitchen predniSONE (DELTASONE) 20 MG tablet One tablet 3 times daily for 3 days, then one tablet two times daily for 3 days, then one tablet once daily for 5 days, then stop  . ranitidine (ZANTAC) 150 MG capsule Take 1  capsule (150 mg total) by mouth 2 (two) times daily.  . simvastatin (ZOCOR) 40 MG tablet TAKE 1 TABLET BY MOUTH ONCE A DAY.  Marland Kitchen testosterone cypionate (DEPOTESTOSTERONE CYPIONATE) 200 MG/ML injection   . traMADol (ULTRAM) 50 MG tablet TAKE 2 TABLETS BY MOUTH EVERY 8 HOURS AS NEEDED. (Patient taking differently: Take 50-100 mg by mouth 3 (three) times daily as needed (FOR PAIN.). TAKE 2 TABLETS BY MOUTH EVERY 8 HOURS AS NEEDED.)  . TRAVATAN Z 0.004 % SOLN ophthalmic solution Place 1 drop into both eyes at bedtime.   . triamcinolone ointment (KENALOG) 0.1 % APPLY TO AFFECTED AREAS TWICE DAILY. (Patient taking differently: APPLY TO AFFECTED AREAS TWICE DAILY AS NEEDED FOR DRY/ITCHY SKIN.)  . VICTOZA 18 MG/3ML SOPN INJECT 1.8 MG SUBCUTANEOUSLY ONCE DAILY.  Marland Kitchen Vitamin D, Ergocalciferol, (DRISDOL) 50000 units CAPS capsule TAKE ONE CAPSULE BY MOUTH ONCE WEEKLY.   Facility-Administered Encounter Medications as of 2018-03-25  Medication  . triamcinolone acetonide (KENALOG-40) injection 20 mg     Self-harm Behaviors Risk Assessment Self-harm risk factors: Self-harm risk factors: (None Reported) Patient endorses recent thoughts of harming self: Have you recently had any thoughts about harming yourself?: No  [place] Suicide Severity Rating Scale: No flowsheet data found. C-SRSS 01/07/2018 2018/03/25  1. Wish to be Dead No No  2. Suicidal Thoughts No No  6. Suicide Behavior Question No No     Danger to Others Risk Assessment Danger to others risk factors:  NA Patient endorses recent thoughts of harming others:  NA   Substance Use Assessment Patient recently consumed alcohol:  No  Alcohol Use Disorder Identification Test (AUDIT):  Alcohol Use Disorder Test (AUDIT) 01/07/2018 03/25/2018  1. How often do you have a drink containing alcohol? 0 0  2. How many drinks containing alcohol do you have on a typical day when you are drinking? 0 0  3. How often do you have six or more drinks on one occasion? 0 0   AUDIT-C Score 0 0  Intervention/Follow-up AUDIT Score <7 follow-up not indicated AUDIT Score <7 follow-up not indicated   Patient recently used drugs:  No    Goals, Interventions and Follow-up Plan Goals: Increase healthy adjustment to current life circumstances Interventions: Behavioral Activation and Supportive Counseling Follow-up Plan: [facility] Phone Follow Up      Summary:   Follow Up - Patient has an increase in his PHQ score and a lowered GAD score.  Patient reports that his ex-girlfriend has been trying to get in contact with him.  Patient reports increased pain due to a pinched nerve in his back.  Patient reports deceased sleep due to his neck and back pain.  Patient has not been able to get out of his  home for the past couple of weeks due to pain in his foot and his back.    Patient denies SI/HI/Psychosis/Substance Abuse. If your symptoms worsen or you have thoughts of suicide/homicide, PLEASE SEEK IMMEDIATE MEDICAL ATTENTION.  You may always call:  National Suicide Hotline: 307-553-5647;  [place] Crisis Line: 651-489-9161;  Crisis Recovery in [place]: 503-658-5501.  These are available 24 hours a day, 7 days a week.  Writer discussed his physical activities that he is able to do without causing him any pain.  Writer guided the patient through breathing exercises.  Writer encouraged the patient to follow through with the recommendations from his.  Patient reports that his psychiatric medication is working for him.     During the next session the patient will continue to take his dog for a walk as a means of exercise.  The patient is going to get in contact with a church member to bring him some food.  Patient is going to continue to talk to other male friends that he has an interest in romantically.    Graciella Freer [place], LCAS-A

## 2018-03-01 ENCOUNTER — Ambulatory Visit: Payer: Medicare HMO | Admitting: Family Medicine

## 2018-03-01 ENCOUNTER — Other Ambulatory Visit: Payer: Self-pay | Admitting: "Endocrinology

## 2018-03-02 ENCOUNTER — Other Ambulatory Visit: Payer: Self-pay

## 2018-03-02 MED ORDER — LEVOTHYROXINE SODIUM 100 MCG PO TABS: 100.0000 ug | ORAL_TABLET | Freq: Every day | ORAL | 3 refills | Status: DC

## 2018-03-02 NOTE — Progress Notes (Signed)
[place] specialist to work on behavioral activation. No change in medication recommendation.

## 2018-03-07 ENCOUNTER — Telehealth: Payer: Self-pay | Admitting: Family Medicine

## 2018-03-07 ENCOUNTER — Other Ambulatory Visit: Payer: Self-pay | Admitting: Family Medicine

## 2018-03-07 ENCOUNTER — Other Ambulatory Visit: Payer: Self-pay

## 2018-03-07 MED ORDER — GABAPENTIN 600 MG PO TABS: ORAL_TABLET | ORAL | 2 refills | Status: AC

## 2018-03-07 NOTE — Telephone Encounter (Signed)
Done

## 2018-03-07 NOTE — Telephone Encounter (Signed)
Pt called left a VM that he is out of the nerve pain medicine, and he is hurting, wants you to call something in

## 2018-03-08 ENCOUNTER — Other Ambulatory Visit: Payer: Self-pay | Admitting: Family Medicine

## 2018-03-09 ENCOUNTER — Other Ambulatory Visit: Payer: Self-pay | Admitting: Family Medicine

## 2018-03-10 ENCOUNTER — Telehealth: Payer: Self-pay | Admitting: Family Medicine

## 2018-03-10 NOTE — Telephone Encounter (Signed)
Pt LVM that the gabapentin is not working,m he needs pain medicine, or pain management dr

## 2018-03-10 NOTE — Telephone Encounter (Signed)
Spoke with patient he would like to go see Dr.[place] so I will send that referral over now.

## 2018-03-10 NOTE — Telephone Encounter (Signed)
pls contact pt andask if he will return to dr Merlene Laughter the only pain mx Dr in the [place] or go to [place], if [place], please refer for chronic back pain due to arhtritis of spine and disc disease , thank you

## 2018-03-14 MED ORDER — TRAMADOL HCL 50 MG PO TABS: ORAL_TABLET | ORAL | 0 refills | Status: DC

## 2018-03-15 ENCOUNTER — Telehealth: Payer: Self-pay

## 2018-03-15 DIAGNOSIS — F341 Dysthymic disorder: Secondary | ICD-10-CM

## 2018-03-16 ENCOUNTER — Other Ambulatory Visit: Payer: Self-pay | Admitting: Family Medicine

## 2018-03-16 ENCOUNTER — Other Ambulatory Visit: Payer: Self-pay | Admitting: "Endocrinology

## 2018-03-16 MED ORDER — LEVOTHYROXINE SODIUM 100 MCG PO TABS: 100.0000 ug | ORAL_TABLET | Freq: Every day | ORAL | 3 refills | Status: DC

## 2018-03-23 ENCOUNTER — Telehealth: Payer: Self-pay | Admitting: Family Medicine

## 2018-03-23 ENCOUNTER — Telehealth: Payer: Self-pay | Admitting: "Endocrinology

## 2018-03-23 DIAGNOSIS — W19XXXA Unspecified fall, initial encounter: Secondary | ICD-10-CM | POA: Diagnosis not present

## 2018-03-23 DIAGNOSIS — R5381 Other malaise: Secondary | ICD-10-CM | POA: Diagnosis not present

## 2018-03-23 DIAGNOSIS — T1490XA Injury, unspecified, initial encounter: Secondary | ICD-10-CM | POA: Diagnosis not present

## 2018-03-23 NOTE — Telephone Encounter (Signed)
Spoke with patient regarding message. He stated he is falling when going from sitting to standing. He called Dr.Nida's office and they told him to call us. I advised him to go and be evaluated at urgent care or the ED and he verbalized understanding. Patient was scheduled to see Dr.Simpson on Oct 2 at 3:20pm

## 2018-03-23 NOTE — BH Specialist Note (Signed)
[place] Telephone Follow-up  MRN: [id number] NAME: Nicholas [name] Date: 03/23/18   Total time: 30 minutes Call number: 3/6  Reason for call today: Reason for Contact: PHQ9-4 weeks  PHQ-9 Scores:  Depression screen [facility] 2/9 03/23/2018 02/24/2018 02/22/2018 01/07/2018 12/24/2017  Decreased Interest 1 2 0 2 1  Down, Depressed, Hopeless '1 1 1 2 '$ 0  PHQ - 2 Score '2 3 1 4 1  '$ Altered sleeping '2 3 2 1 1  '$ Tired, decreased energy '1 1 3 1 2  '$ Change in appetite 0 0 2 0 2  Feeling bad or failure about yourself  0 1 1 0 0  Trouble concentrating 0 0 0 0 0  Moving slowly or fidgety/restless 0 0 3 0 2  Suicidal thoughts 0 0 0 0 0  PHQ-9 Score '5 8 12 6 8  '$ Difficult doing work/chores Somewhat difficult Somewhat difficult Somewhat difficult Not difficult at all Not difficult at all  Some recent data might be hidden   GAD-7 Scores:  GAD 7 : Generalized Anxiety Score 03/23/2018 02/24/2018 01/07/2018  Nervous, Anxious, on Edge 0 0 1  Control/stop worrying 0 0 0  Worry too much - different things 0 0 1  Trouble relaxing 0 0 0  Restless 0 0 0  Easily annoyed or irritable 0 0 0  Afraid - awful might happen 0 0 0  Total GAD 7 Score 0 0 2  Anxiety Difficulty Not difficult at all Not difficult at all Not difficult at all    Stress Current stressors: Current Stressors: (Chronic pain) Sleep: Sleep: Decreased, Difficulty staying asleep Appetite: Appetite: No problems Coping ability: Coping ability: Exhausted, Overwhelmed Patient taking medications as prescribed:    Current medications:  Outpatient Encounter Medications as of 03/15/2018  Medication Sig  . ANDROGEL PUMP 20.25 MG/ACT (1.62%) GEL Apply 2 application topically daily at 2 PM. Apply 1 pump to each shoulder daily in the afternoon  . aspirin EC 81 MG tablet Take 81 mg by mouth every evening.   . brimonidine-timolol (COMBIGAN) 0.2-0.5 % ophthalmic solution Place 1 drop into both eyes 2 (two) times daily.   Marland Kitchen buPROPion (WELLBUTRIN  XL) 150 MG 24 hr tablet Take 1 tablet (150 mg total) by mouth daily.  . citalopram (CELEXA) 40 MG tablet TAKE 1 TABLET BY MOUTH ONCE A DAY.  Marland Kitchen Continuous Blood Gluc Sensor (FREESTYLE LIBRE 14 DAY SENSOR) MISC Inject 1 each into the skin every 14 (fourteen) days. Use as directed.  . cyclobenzaprine (FLEXERIL) 10 MG tablet TAKE (1) TABLET BY MOUTH EVERY EIGHT HOURS AS NEEDED FOR MUSCLE SPASMS.  Marland Kitchen gabapentin (NEURONTIN) 600 MG tablet TAKE ONE TABLET BY MOUTH 3 TIMES DAILY.  Marland Kitchen HUMULIN R U-500 KWIKPEN 500 UNIT/ML kwikpen INJECT UP TO 100 UNITS SUBCUTANEOUSLY 30 MINUTES BEFORE MEALS.  . hydrochlorothiazide (HYDRODIURIL) 25 MG tablet Take 1 tablet (25 mg total) by mouth daily.  Marland Kitchen ibuprofen (ADVIL,MOTRIN) 800 MG tablet Take 1 tablet (800 mg total) by mouth 3 (three) times daily.  . Insulin Pen Needle (SURE COMFORT PEN NEEDLES) 31G X 8 MM MISC Inject 1 each into the skin 4 (four) times daily.  Marland Kitchen ketoconazole (NIZORAL) 2 % shampoo Apply 1 application topically 2 (two) times a week.  . lamoTRIgine (LAMICTAL) 200 MG tablet TAKE 1 TABLET BY MOUTH ONCE A DAY.  Marland Kitchen lisinopril (PRINIVIL,ZESTRIL) 10 MG tablet Take 1 tablet (10 mg total) by mouth daily.  . metoprolol succinate (TOPROL-XL) 25 MG 24 hr tablet TAKE (1) TABLET  BY MOUTH ONCE DAILY.  . Na Sulfate-K Sulfate-Mg Sulf 17.5-3.13-1.6 GM/177ML SOLN Take 1 kit by mouth as directed. (Patient not taking: Reported on 02/22/2018)  . naproxen (NAPROSYN) 500 MG tablet TAKE ONE TABLET BY MOUTH TWICE DAILY WITH A MEAL.  Marland Kitchen NOVOFINE 30G X 8 MM MISC   . pantoprazole (PROTONIX) 40 MG tablet TAKE (1) TABLET BY MOUTH TWICE DAILY.  Marland Kitchen predniSONE (DELTASONE) 20 MG tablet One tablet 3 times daily for 3 days, then one tablet two times daily for 3 days, then one tablet once daily for 5 days, then stop  . ranitidine (ZANTAC) 150 MG capsule Take 1 capsule (150 mg total) by mouth 2 (two) times daily.  . simvastatin (ZOCOR) 40 MG tablet TAKE 1 TABLET BY MOUTH ONCE A DAY.  Marland Kitchen testosterone  cypionate (DEPOTESTOSTERONE CYPIONATE) 200 MG/ML injection   . traMADol (ULTRAM) 50 MG tablet TAKE 2 TABLETS BY MOUTH EVERY 8 HOURS AS NEEDED.  Marland Kitchen TRAVATAN Z 0.004 % SOLN ophthalmic solution Place 1 drop into both eyes at bedtime.   . triamcinolone ointment (KENALOG) 0.1 % APPLY TO AFFECTED AREAS TWICE DAILY. (Patient taking differently: APPLY TO AFFECTED AREAS TWICE DAILY AS NEEDED FOR DRY/ITCHY SKIN.)  . VICTOZA 18 MG/3ML SOPN INJECT 1.8 MG SUBCUTANEOUSLY ONCE DAILY.  Marland Kitchen Vitamin D, Ergocalciferol, (DRISDOL) 50000 units CAPS capsule TAKE ONE CAPSULE BY MOUTH ONCE WEEKLY.  . [DISCONTINUED] FLUoxetine (PROZAC) 20 MG capsule TAKE 1 CAPSULE BY MOUTH DAILY. (Patient not taking: Reported on 01/25/2018)  . [DISCONTINUED] levothyroxine (SYNTHROID, LEVOTHROID) 100 MCG tablet Take 1 tablet (100 mcg total) by mouth daily before breakfast.   Facility-Administered Encounter Medications as of 03/15/2018  Medication  . triamcinolone acetonide (KENALOG-40) injection 20 mg     Self-harm Behaviors Risk Assessment Self-harm risk factors: Self-harm risk factors: (None Reported) Patient endorses recent thoughts of harming self: Have you recently had any thoughts about harming yourself?: No  [place] Suicide Severity Rating Scale: No flowsheet data found. C-SRSS 01/07/2018 March 20, 2018  1. Wish to be Dead No No  2. Suicidal Thoughts No No  6. Suicide Behavior Question No No     Danger to Others Risk Assessment Danger to others risk factors: Danger to Others Risk Factors: No risk factors noted Patient endorses recent thoughts of harming others: Notification required: No need or identified person  Dynamic Appraisal of Situational Aggression (DASA): No flowsheet data found.   Substance Use Assessment Patient recently consumed alcohol:  No  Alcohol Use Disorder Identification Test (AUDIT):  Alcohol Use Disorder Test (AUDIT) 01/07/2018 03/20/2018  1. How often do you have a drink containing alcohol? 0 0  2. How  many drinks containing alcohol do you have on a typical day when you are drinking? 0 0  3. How often do you have six or more drinks on one occasion? 0 0  AUDIT-C Score 0 0  Intervention/Follow-up AUDIT Score <7 follow-up not indicated AUDIT Score <7 follow-up not indicated   Patient recently used drugs:  No    Goals, Interventions and Follow-up Plan Goals: Increase healthy adjustment to current life circumstances Interventions: Mindfulness or Relaxation Training and Supportive Counseling Follow-up Plan: [facility] Phone Follow Up   Summary:   Follow Up - Patient has a decrease in his PHQ and GAD score.  Patient denies any stressors.  Patient reports utilizing mindfulness due to his chronic pain associated with a pinched nerve in his back.   Patient reports that he had to stop walking due to chronic pain.  Patient reports  that his psychiatric medication has been working well for him.  Writer discussed sleep hygiene techniques.  Patient denies SI/HI/Psychosis/Substance Abuse. If your symptoms worsen or you have thoughts of suicide/homicide, PLEASE SEEK IMMEDIATE MEDICAL ATTENTION.  You may always call:  National Suicide Hotline: (229) 685-7370;  [place] Crisis Line: 7242373291;  Crisis Recovery in [place]: 204 737 0244.  These are available 24 hours a day, 7 days a week.  During the next session the patient will begin to focus on relapse prevention    Johnnye Sima, Tykera Skates [place]ne, LCAS-A

## 2018-03-23 NOTE — Telephone Encounter (Signed)
Patient is aware of of the recommendations

## 2018-03-23 NOTE — Telephone Encounter (Signed)
Pt is calling --he fell on Sunday had to call EMS to help him up, then the pt fell again today (wed) had to call EMS to help him up.  Pt states that Dr Moshe Cipro lowered his BP medicine and he was fine, Dr Dorris Fetch raised the BP medicine back up and now the patient is falling.  I advised the PT that Dr Moshe Cipro was unavailable this week, and to please reach out to Dr Dorris Fetch, and that I would forward this message on to the Nurse and the Dr that was here.

## 2018-03-23 NOTE — Telephone Encounter (Signed)
Nicholas [name] is calling stating that he has started falling a lot and the rescue squad is having to come get him up and he is thinking it could be coming from his blood pressure medication causing him to get dizzy, please advise?

## 2018-03-23 NOTE — Telephone Encounter (Signed)
He may have other reasons for falling. He needs to see his PMD ASAP for better evaluation and they can adjust his blood pressure medications.

## 2018-03-28 ENCOUNTER — Telehealth: Payer: Self-pay | Admitting: Family Medicine

## 2018-03-28 ENCOUNTER — Telehealth: Payer: Self-pay

## 2018-03-28 ENCOUNTER — Other Ambulatory Visit: Payer: Self-pay

## 2018-03-28 ENCOUNTER — Encounter (HOSPITAL_COMMUNITY): Payer: Self-pay

## 2018-03-28 ENCOUNTER — Emergency Department (HOSPITAL_COMMUNITY)
Admission: EM | Admit: 2018-03-28 | Discharge: 2018-03-28 | Disposition: A | Discharge status: Home / Self Care | Payer: Medicare HMO | Attending: Emergency Medicine | Admitting: Emergency Medicine

## 2018-03-28 DIAGNOSIS — E039 Hypothyroidism, unspecified: Secondary | ICD-10-CM | POA: Insufficient documentation

## 2018-03-28 DIAGNOSIS — S8991XA Unspecified injury of right lower leg, initial encounter: Secondary | ICD-10-CM | POA: Diagnosis present

## 2018-03-28 DIAGNOSIS — Y999 Unspecified external cause status: Secondary | ICD-10-CM | POA: Insufficient documentation

## 2018-03-28 DIAGNOSIS — Y9389 Activity, other specified: Secondary | ICD-10-CM | POA: Insufficient documentation

## 2018-03-28 DIAGNOSIS — Z23 Encounter for immunization: Secondary | ICD-10-CM | POA: Diagnosis not present

## 2018-03-28 DIAGNOSIS — Z794 Long term (current) use of insulin: Secondary | ICD-10-CM | POA: Diagnosis not present

## 2018-03-28 DIAGNOSIS — S80811A Abrasion, right lower leg, initial encounter: Secondary | ICD-10-CM | POA: Insufficient documentation

## 2018-03-28 DIAGNOSIS — E1140 Type 2 diabetes mellitus with diabetic neuropathy, unspecified: Secondary | ICD-10-CM | POA: Diagnosis not present

## 2018-03-28 DIAGNOSIS — I10 Essential (primary) hypertension: Secondary | ICD-10-CM | POA: Insufficient documentation

## 2018-03-28 DIAGNOSIS — W010XXA Fall on same level from slipping, tripping and stumbling without subsequent striking against object, initial encounter: Secondary | ICD-10-CM | POA: Diagnosis not present

## 2018-03-28 DIAGNOSIS — Z7982 Long term (current) use of aspirin: Secondary | ICD-10-CM | POA: Insufficient documentation

## 2018-03-28 DIAGNOSIS — Y929 Unspecified place or not applicable: Secondary | ICD-10-CM | POA: Insufficient documentation

## 2018-03-28 DIAGNOSIS — Z87891 Personal history of nicotine dependence: Secondary | ICD-10-CM | POA: Insufficient documentation

## 2018-03-28 DIAGNOSIS — R42 Dizziness and giddiness: Secondary | ICD-10-CM | POA: Insufficient documentation

## 2018-03-28 DIAGNOSIS — R000 Tachycardia, unspecified: Secondary | ICD-10-CM | POA: Diagnosis not present

## 2018-03-28 LAB — COMPREHENSIVE METABOLIC PANEL
ALK PHOS: 81 U/L (ref 38–126)
ALT: 25 U/L (ref 0–44)
AST: 27 U/L (ref 15–41)
Albumin: 3.5 g/dL (ref 3.5–5.0)
Anion gap: 10 (ref 5–15)
BUN: 16 mg/dL (ref 8–23)
CALCIUM: 8.7 mg/dL — AB (ref 8.9–10.3)
CHLORIDE: 101 mmol/L (ref 98–111)
CO2: 26 mmol/L (ref 22–32)
CREATININE: 1.26 mg/dL — AB (ref 0.61–1.24)
GFR calc non Af Amer: 59 mL/min — ABNORMAL LOW (ref >60)
GLUCOSE: 235 mg/dL — AB (ref 70–99)
Potassium: 4.4 mmol/L (ref 3.5–5.1)
SODIUM: 137 mmol/L (ref 135–145)
Total Bilirubin: 1 mg/dL (ref 0.3–1.2)
Total Protein: 6.3 g/dL — ABNORMAL LOW (ref 6.5–8.1)

## 2018-03-28 LAB — CBC WITH DIFFERENTIAL/PLATELET
BASOS PCT: 1 %
Basophils Absolute: 0.1 10*3/uL (ref 0.0–0.1)
EOS ABS: 0.8 10*3/uL — AB (ref 0.0–0.7)
Eosinophils Relative: 7 %
HCT: 47.2 % (ref 39.0–52.0)
HEMOGLOBIN: 15 g/dL (ref 13.0–17.0)
LYMPHS ABS: 1.4 10*3/uL (ref 0.7–4.0)
Lymphocytes Relative: 12 %
MCH: 26.6 pg (ref 26.0–34.0)
MCHC: 31.8 g/dL (ref 30.0–36.0)
MCV: 83.7 fL (ref 78.0–100.0)
MONOS PCT: 9 %
Monocytes Absolute: 1.1 10*3/uL — ABNORMAL HIGH (ref 0.1–1.0)
NEUTROS PCT: 71 %
Neutro Abs: 8.5 10*3/uL — ABNORMAL HIGH (ref 1.7–7.7)
Platelets: 244 10*3/uL (ref 150–400)
RBC: 5.64 MIL/uL (ref 4.22–5.81)
RDW: 18 % — AB (ref 11.5–15.5)
WBC: 11.8 10*3/uL — ABNORMAL HIGH (ref 4.0–10.5)

## 2018-03-28 LAB — CBG MONITORING, ED: GLUCOSE-CAPILLARY: 227 mg/dL — AB (ref 70–99)

## 2018-03-28 MED ORDER — TETANUS-DIPHTH-ACELL PERTUSSIS 5-2.5-18.5 LF-MCG/0.5 IM SUSP
0.5000 mL | Freq: Once | INTRAMUSCULAR | Status: AC
  Administered 2018-03-28: 0.5 mL via INTRAMUSCULAR
  Filled 2018-03-28: qty 0.5

## 2018-03-28 MED ORDER — SODIUM CHLORIDE 0.9 % IV BOLUS
1000.0000 mL | Freq: Once | INTRAVENOUS | Status: AC
  Administered 2018-03-28: 1000 mL via INTRAVENOUS

## 2018-03-28 NOTE — Telephone Encounter (Signed)
Patient called again this morning wanting advice and what he should do. He stated he had fallen several times over the weekend. He has a sore on his leg that was oozing but now just has a scab but is red. He states he has been double dosing on blood pressure medication by accident because Dr.Nida made a change and he didn't realize he was already taking that medication. I asked him why he didn't go to be evaluated when I advised him to last week and he stated he didn't know. I told him he needed to go to the ER to be evaluated not only for the leg but also for the multiple falls and he verbalized understanding and stated he would. I again advised him to change positions slowly. To make sure he had something to hold on to when going to from sitting to standing with verbal understanding.

## 2018-03-28 NOTE — ED Notes (Signed)
Patient states that she has not lost consciousness during these episodes and denies hitting his head. States that it just "feels like his knees get wobbly".

## 2018-03-28 NOTE — ED Triage Notes (Signed)
Pt reports his blood pressure medication is making him dizzy when he stands up and has fallen approx 7 times in the past week.  Pt alert and oriented.  Pt also has multiple wounds to lower legs and c/o back pain.

## 2018-03-28 NOTE — Telephone Encounter (Signed)
Noted and agree. 

## 2018-03-28 NOTE — Telephone Encounter (Signed)
PTs sister is calling as her brother is confused and is scared to take his medicine BP and Insulin. As he has fallen all weekend. He went to the ER and they told him he was fine and sent him home.   PT is scared and confused--no nurse available.Marland Kitchen

## 2018-03-28 NOTE — Telephone Encounter (Signed)
pls contact pt, NEEDS to returmn top ED  Based on rep[ort of confusion and not taking his medication, unable to drive himself do he needs to call 911

## 2018-03-28 NOTE — Telephone Encounter (Signed)
I PERSONALLY SPOKE WITH THE PT BEFORE 5 DURING CLINIC, HE DENIED BEING CONFUSED, AND ANSWERED QUESTIONS APPROPRIATELY NO FALL SINCE THIS PAST WEEKEND , AND I REVIEWED THE ED VISIT NOTE EARLIER TODAY, HIS BP AND BLOOD SUGAR WERE WITHIN A RANGE A THAT WAS CONSIDERED SAFE AT THAT TIME FOR HIM TO GO HOME I WILL SEE HIM IN THE AM, HE IS STILL ADVISED TO GO TO ED IF SYMPTOMS WORSEN

## 2018-03-28 NOTE — ED Notes (Signed)
The patient states that he has been having issues with circulation in his lower legs for years since being diagnosed as a diabetic, but the bilateral open wounds to each leg that are current are from skin tears and abrasions that occurred during the falls.

## 2018-03-28 NOTE — ED Provider Notes (Signed)
[facility] EMERGENCY DEPARTMENT Provider Note   CSN: [id number] Arrival date & time: 03/28/18  0941     History   Chief Complaint Chief Complaint  Patient presents with  . Fall    HPI Nicholas [name] is a 62 y.o. male.  HPI  62 year old male with history of hypertension and diabetes presents with multiple falls and dizziness when standing.  He states that for at least a week or so he is been getting dizzy/off balance and having bilateral leg weakness that caused him to fall.  He had multiple falls and one time injured his low back.  However he states that pain comes and goes and he thinks he pulled a muscle.  Is not hurting currently.  He had multiple abrasions to his legs during this time as well.  This morning he was considering getting checked out and when he was putting his shoe on his left foot scraped 1 of the abrasions on his right leg and it started bleeding.  Was unable to stop the bleeding so he came in here.  He states that he accidentally had 1 of his blood pressure medicines doubled up because his endocrinologist did not know that his PCP had lowered his blood pressure medicine due to the symptoms.  He stopped all of his medicines about 2 or 3 days ago and the dizziness part has resolved.  No headache or blurry vision.  No chest pain or new shortness of breath/abdominal pain.  Past Medical History:  Diagnosis Date  . Chronic knee pain   . Depression   . Diabetes ([place])   . Glaucoma   . Hyperlipidemia   . Hypertension   . MRSA (methicillin resistant Staphylococcus aureus)    patient denies  . Neuropathy   . Poor circulation    leg   . Suicide attempt ([place])   . Testosterone deficiency     Patient Active Problem List   Diagnosis Date Noted  . Low back pain with left-sided sciatica 02/22/2018  . Headache 10/22/2017  . Depression, major, single episode, moderate ([place]) 10/22/2017  . Type 2 diabetes mellitus with diabetic nephropathy, without long-term current use  of insulin ([place]) 06/23/2017  . Neck pain on left side 04/10/2017  . Dental caries 04/10/2017  . Hypothyroidism 08/19/2016  . GERD (gastroesophageal reflux disease) 04/14/2016  . Charcot foot due to diabetes mellitus ([place]) 01/28/2016  . Benign essential tremor 01/28/2016  . Skin lesion of back 10/07/2015  . Hyperlipidemia 04/10/2015  . Benign hypertension 04/10/2015  . Abdominal wall bulge 01/15/2015  . Hx of adenomatous colonic polyps 02/21/2014  . Allergic rhinitis 07/24/2013  . Onychomycosis 05/21/2013  . Type 2 diabetes mellitus with pressure callus ([place]) 05/21/2013  . Back pain with radiation 04/20/2011  . Seborrheic dermatitis 11/20/2010  . Dermatomycosis 11/20/2010  . FATIGUE 04/01/2010  . CARPAL TUNNEL SYNDROME, BILATERAL 03/24/2010  . Hypogonadism in male 01/27/2010  . DEPRESSION/ANXIETY 01/27/2010  . CHF 01/13/2010  . DYSPNEA 01/13/2010  . Diabetes mellitus type 2 in obese ([place]) 12/12/2009  . MORBID OBESITY 12/12/2009  . Essential hypertension 12/12/2009  . PVD (peripheral vascular disease) ([place]) 12/12/2009    Past Surgical History:  Procedure Laterality Date  . CHOLECYSTECTOMY N/A 02/25/2015   Procedure: LAPAROSCOPIC CHOLECYSTECTOMY;  Surgeon: Aviva Signs, MD;  Location: [facility] ORS;  Service: General;  Laterality: N/A;  . COLONOSCOPY  03/2008   ZTI:WPYKDX external hemorrhoidal tag, otherwise normal rectum/Two diminutive rectosigmoid polyps s/p bx/ Polyp in the opposite the ileocecal valve s/p  bx. tubular adenoma.  . COLONOSCOPY N/A 03/23/2014   LEX:NTZGYFVC colon polyps throughout his rectum and colon. Sessile and pedunculated. The largest polyp was proximally 6 mm in dimensions in the retum at 3 cm with adjacent diminutive polyp.  There was also a polyps on ileocecal valve, ascending segmen descending and sigmoid segment. as the colon was tortuous & elongated requiring externa abd pressure and changing of the pts postion to reach cecum  . COLONOSCOPY WITH PROPOFOL N/A  07/29/2017   Procedure: COLONOSCOPY WITH PROPOFOL;  Surgeon: Daneil Dolin, MD;  Location: [facility] ENDO SUITE;  Service: Endoscopy;  Laterality: N/A;  12:30pm  . ESOPHAGOGASTRODUODENOSCOPY N/A 03/23/2014   RMR: 1. Patient had circumferential distal esophageal erosions within 5 mm of the GE junction.  No Barrett's esophagus. Tubular esophagus patent throughout its course. 2.  Diffuse gastric erosions. (1) 53m area of healing ulceration in the antrum. No ulcer or infiltrating process observed. Patent pylorus. Normal-appearing first and second portion of the duodenum .  . kidney stones     cysto with litholapexy  . POLYPECTOMY  07/29/2017   Procedure: POLYPECTOMY;  Surgeon: RDaneil Dolin MD;  Location: [facility] ENDO SUITE;  Service: Endoscopy;;  colon         Home Medications    Prior to Admission medications   Medication Sig Start Date End Date Taking? Authorizing Provider  ANDROGEL PUMP 20.25 MG/ACT (1.62%) GEL Apply 2 application topically daily at 2 PM. Apply 1 pump to each shoulder daily in the afternoon 05/15/16   [provider]  aspirin EC 81 MG tablet Take 81 mg by mouth every evening.     [provider]  brimonidine-timolol (COMBIGAN) 0.2-0.5 % ophthalmic solution Place 1 drop into both eyes 2 (two) times daily.     [provider]  buPROPion (WELLBUTRIN XL) 150 MG 24 hr tablet Take 1 tablet (150 mg total) by mouth daily. 01/21/18   SFayrene Helper MD  citalopram (CELEXA) 40 MG tablet TAKE 1 TABLET BY MOUTH ONCE A DAY. 02/14/18   SFayrene Helper MD  Continuous Blood Gluc Sensor (FREESTYLE LIBRE 14 DAY SENSOR) MISC Inject 1 each into the skin every 14 (fourteen) days. Use as directed. 09/23/17   NCassandria Anger MD  cyclobenzaprine (FLEXERIL) 10 MG tablet TAKE (1) TABLET BY MOUTH EVERY EIGHT HOURS AS NEEDED FOR MUSCLE SPASMS. 03/07/18   SFayrene Helper MD  FLUoxetine (PROZAC) 20 MG capsule TAKE 1 CAPSULE BY MOUTH DAILY. 03/16/18   SFayrene Helper  MD  gabapentin (NEURONTIN) 600 MG tablet TAKE ONE TABLET BY MOUTH 3 TIMES DAILY. 03/07/18   SFayrene Helper MD  HUMULIN R U-500 KWIKPEN 500 UNIT/ML kwikpen INJECT UP TO 100 UNITS SUBCUTANEOUSLY 30 MINUTES BEFORE MEALS. 01/10/18   NCassandria Anger MD  hydrochlorothiazide (HYDRODIURIL) 25 MG tablet Take 1 tablet (25 mg total) by mouth daily. 01/25/18   NCassandria Anger MD  ibuprofen (ADVIL,MOTRIN) 800 MG tablet Take 1 tablet (800 mg total) by mouth 3 (three) times daily. 02/22/18   SFayrene Helper MD  Insulin Pen Needle (SURE COMFORT PEN NEEDLES) 31G X 8 MM MISC Inject 1 each into the skin 4 (four) times daily. 10/06/17   NCassandria Anger MD  ketoconazole (NIZORAL) 2 % shampoo Apply 1 application topically 2 (two) times a week. 12/20/17   SFayrene Helper MD  lamoTRIgine (LAMICTAL) 200 MG tablet TAKE 1 TABLET BY MOUTH ONCE A DAY. 01/17/18   SFayrene Helper MD  levothyroxine (SYNTHROID, LEVOTHROID) 100 MCG tablet Take 1 tablet (100 mcg total) by mouth daily before breakfast. 03/16/18   Nida, Marella Chimes, MD  lisinopril (PRINIVIL,ZESTRIL) 10 MG tablet Take 1 tablet (10 mg total) by mouth daily. 01/25/18   Cassandria Anger, MD  metoprolol succinate (TOPROL-XL) 25 MG 24 hr tablet TAKE (1) TABLET BY MOUTH ONCE DAILY. 01/17/18   Fayrene Helper, MD  Na Sulfate-K Sulfate-Mg Sulf 17.5-3.13-1.6 GM/177ML SOLN Take 1 kit by mouth as directed. Patient not taking: Reported on 02/22/2018 06/14/17   Rourk, Cristopher Estimable, MD  naproxen (NAPROSYN) 500 MG tablet TAKE ONE TABLET BY MOUTH TWICE DAILY WITH A MEAL. 03/14/18   Fayrene Helper, MD  NOVOFINE 30G X 8 MM MISC  05/20/16   [provider]  pantoprazole (PROTONIX) 40 MG tablet TAKE (1) TABLET BY MOUTH TWICE DAILY. 04/05/17   Annitta Needs, NP  predniSONE (DELTASONE) 20 MG tablet One tablet 3 times daily for 3 days, then one tablet two times daily for 3 days, then one tablet once daily for 5 days, then stop 02/22/18    Fayrene Helper, MD  ranitidine (ZANTAC) 150 MG capsule Take 1 capsule (150 mg total) by mouth 2 (two) times daily. 02/22/18   Fayrene Helper, MD  simvastatin (ZOCOR) 40 MG tablet TAKE 1 TABLET BY MOUTH ONCE A DAY. 01/10/18   Fayrene Helper, MD  testosterone cypionate (DEPOTESTOSTERONE CYPIONATE) 200 MG/ML injection  10/19/17   [provider]  traMADol (ULTRAM) 50 MG tablet TAKE 2 TABLETS BY MOUTH EVERY 8 HOURS AS NEEDED. 03/14/18   Fayrene Helper, MD  TRAVATAN Z 0.004 % SOLN ophthalmic solution Place 1 drop into both eyes at bedtime.  08/30/15   [provider]  triamcinolone ointment (KENALOG) 0.1 % APPLY TO AFFECTED AREAS TWICE DAILY. Patient taking differently: APPLY TO AFFECTED AREAS TWICE DAILY AS NEEDED FOR DRY/ITCHY SKIN. 04/06/17   Nida, Marella Chimes, MD  VICTOZA 18 MG/3ML SOPN INJECT 1.8 MG SUBCUTANEOUSLY ONCE DAILY. 01/10/18   Cassandria Anger, MD  Vitamin D, Ergocalciferol, (DRISDOL) 50000 units CAPS capsule TAKE ONE CAPSULE BY MOUTH ONCE WEEKLY. 02/08/18   Cassandria Anger, MD    Family History Family History  Problem Relation Age of Onset  . Diabetes Mother   . Leukemia Mother   . Cancer Father        colon, age greater than 46?  Marland Kitchen Heart disease Father   . Glaucoma Father   . Parkinsonism Father        brain hemorrhage  . Alzheimer's disease Father   . Glaucoma Sister   . Dementia Sister   . Colon cancer Neg Hx     Social History Social History   Tobacco Use  . Smoking status: Former Smoker    Packs/day: 1.50    Years: 25.00    Pack years: 37.50    Types: Cigarettes    Last attempt to quit: 02/20/2002    Years since quitting: 16.1  . Smokeless tobacco: Former Systems developer    Types: Snuff    Quit date: 11/02/1980  . Tobacco comment: Quit x 15 years  Substance Use Topics  . Alcohol use: No  . Drug use: No     Allergies   No allergies on file and No known allergies   Review of Systems Review of Systems  Constitutional:  Negative for fever.  Respiratory: Negative for shortness of breath.   Cardiovascular: Negative for chest pain.  Gastrointestinal: Negative  for abdominal pain.  Musculoskeletal: Negative for arthralgias.  Skin: Positive for wound.  Neurological: Positive for light-headedness. Negative for weakness, numbness and headaches.  All other systems reviewed and are negative.    Physical Exam Updated Vital Signs BP (!) 145/97 (BP Location: Right Arm)   Pulse (!) 109   Temp 98.1 F (36.7 C) (Oral)   Resp (!) 24   Ht 6' (1.829 m)   Wt (!) 192.8 kg   SpO2 95%   BMI 57.64 kg/m   Physical Exam  Constitutional: He appears well-developed and well-nourished. No distress.  Morbidly obese  HENT:  Head: Normocephalic and atraumatic.  Right Ear: External ear normal.  Left Ear: External ear normal.  Nose: Nose normal.  Eyes: Pupils are equal, round, and reactive to light. EOM are normal. Right eye exhibits no discharge. Left eye exhibits no discharge.  Neck: Neck supple.  Cardiovascular: Regular rhythm and normal heart sounds. Tachycardia present.  HR low 100s  Pulmonary/Chest: Effort normal and breath sounds normal.  Abdominal: Soft. There is no tenderness.  Musculoskeletal:       Right lower leg: He exhibits no tenderness and no bony tenderness.       Left lower leg: He exhibits no tenderness and no bony tenderness.       Legs: Bilateral ecchymosis and abrasions to anterior lower legs. No bony tenderness or swelling  Neurological: He is alert.  CN 3-12 grossly intact. 5/5 strength in all 4 extremities. Grossly normal sensation. Normal finger to nose.   Skin: Skin is warm and dry. He is not diaphoretic.  Psychiatric: His mood appears not anxious.  Nursing note and vitals reviewed.    ED Treatments / Results  Labs (all labs ordered are listed, but only abnormal results are displayed) Labs Reviewed  COMPREHENSIVE METABOLIC PANEL - Abnormal; Notable for the following components:       Result Value   Glucose, Bld 235 (*)    Creatinine, Ser 1.26 (*)    Calcium 8.7 (*)    Total Protein 6.3 (*)    GFR calc non Af Amer 59 (*)    All other components within normal limits  CBC WITH DIFFERENTIAL/PLATELET - Abnormal; Notable for the following components:   WBC 11.8 (*)    RDW 18.0 (*)    Neutro Abs 8.5 (*)    Monocytes Absolute 1.1 (*)    Eosinophils Absolute 0.8 (*)    All other components within normal limits  CBG MONITORING, ED - Abnormal; Notable for the following components:   Glucose-Capillary 227 (*)    All other components within normal limits    EKG EKG Interpretation  Date/Time:  Monday March 28 2018 09:48:48 EDT Ventricular Rate:  109 PR Interval:    QRS Duration: 90 QT Interval:  313 QTC Calculation: 422 R Axis:   71 Text Interpretation:  Sinus tachycardia Low voltage, precordial leads Minimal ST depression, inferior leads Interpretation limited secondary to artifact Confirmed by Sherwood Gambler [phone]) on 03/28/2018 10:08:19 AM   Radiology No results found.  Procedures Procedures (including critical care time)  Medications Ordered in ED Medications  Tdap (BOOSTRIX) injection 0.5 mL (has no administration in time range)  sodium chloride 0.9 % bolus 1,000 mL (has no administration in time range)     Initial Impression / Assessment and Plan / ED Course  I have reviewed the triage vital signs and the nursing notes.  Pertinent labs & imaging results that were available during my care of the patient  were reviewed by me and considered in my medical decision making (see chart for details).     Labs are reassuring besides some hyperglycemia. Given IV fluids. The dizziness/orthostatic symptoms have resolved since he stopped his BP meds. His primary concern today is the abrasion that re-opened. It is superficial, not bleeding and there is nothing to repair. HR improved with IVF. Plan for d/c home, f/u with PCP. Given TDAP.  Final Clinical  Impressions(s) / ED Diagnoses   Final diagnoses:  Abrasion of right lower extremity, initial encounter  Orthostatic dizziness    ED Discharge Orders    None       Sherwood Gambler, MD 03/28/18 [phone]

## 2018-03-29 NOTE — Telephone Encounter (Signed)
I spoke with pateint after this as documented

## 2018-03-29 NOTE — Telephone Encounter (Signed)
03/28/2018 Spoke with patient and advised him of what Dr.Simpson said. He was very reluctant to return to the hospital stating he just doesn't know what to do. I told him he needed to return to the ED for evaluation because he seems confused. He stated "I'm not not confused. I just don't know what to do." At this time, he asked to speak to [name]. I pulled Dr.Simpson out of a room and she spoke with the patient over the phone.

## 2018-03-30 ENCOUNTER — Encounter: Payer: Self-pay | Admitting: Family Medicine

## 2018-03-30 ENCOUNTER — Other Ambulatory Visit: Payer: Self-pay | Admitting: "Endocrinology

## 2018-03-30 ENCOUNTER — Other Ambulatory Visit: Payer: Self-pay

## 2018-03-30 ENCOUNTER — Ambulatory Visit (INDEPENDENT_AMBULATORY_CARE_PROVIDER_SITE_OTHER): Payer: Medicare HMO | Admitting: Family Medicine

## 2018-03-30 ENCOUNTER — Ambulatory Visit (HOSPITAL_COMMUNITY)
Admission: EM | Admit: 2018-03-30 | Discharge: 2018-03-30 | Disposition: A | Discharge status: Home / Self Care | Payer: Medicare HMO | Attending: Emergency Medicine | Admitting: Emergency Medicine

## 2018-03-30 ENCOUNTER — Emergency Department (HOSPITAL_COMMUNITY)
Admission: EM | Admit: 2018-03-30 | Discharge: 2018-03-30 | Disposition: A | Discharge status: Home / Self Care | Payer: Medicare HMO | Source: Home / Self Care | Attending: Emergency Medicine | Admitting: Emergency Medicine

## 2018-03-30 VITALS — BP 160/90 | HR 89 | Resp 16 | Ht 72.0 in | Wt >= 6400 oz

## 2018-03-30 DIAGNOSIS — J40 Bronchitis, not specified as acute or chronic: Secondary | ICD-10-CM | POA: Diagnosis not present

## 2018-03-30 DIAGNOSIS — R079 Chest pain, unspecified: Secondary | ICD-10-CM | POA: Diagnosis not present

## 2018-03-30 DIAGNOSIS — E1121 Type 2 diabetes mellitus with diabetic nephropathy: Secondary | ICD-10-CM

## 2018-03-30 DIAGNOSIS — R05 Cough: Secondary | ICD-10-CM | POA: Diagnosis not present

## 2018-03-30 DIAGNOSIS — I10 Essential (primary) hypertension: Secondary | ICD-10-CM | POA: Diagnosis not present

## 2018-03-30 MED ORDER — LISINOPRIL 5 MG PO TABS: 5.0000 mg | ORAL_TABLET | Freq: Every day | ORAL | 3 refills | Status: AC

## 2018-03-30 MED ORDER — BENZONATATE 100 MG PO CAPS: 100.0000 mg | ORAL_CAPSULE | Freq: Two times a day (BID) | ORAL | 0 refills | Status: DC | PRN

## 2018-03-30 MED ORDER — PENICILLIN V POTASSIUM 500 MG PO TABS: 500.0000 mg | ORAL_TABLET | Freq: Three times a day (TID) | ORAL | 0 refills | Status: DC

## 2018-03-30 MED ORDER — HYDROCHLOROTHIAZIDE 25 MG PO TABS: 25.0000 mg | ORAL_TABLET | Freq: Every day | ORAL | 3 refills | Status: DC

## 2018-03-30 NOTE — Patient Instructions (Signed)
F/U October 24 as before, call if you need me sooner  Blood pressure is high, increase HCTZ to 25 mg daily , continue benazepril 5 mg one daily  Penicillin and decongestant sent in for your bronchitis, please get CXR today

## 2018-04-01 ENCOUNTER — Encounter: Payer: Self-pay | Admitting: Family Medicine

## 2018-04-04 ENCOUNTER — Other Ambulatory Visit: Payer: Self-pay | Admitting: "Endocrinology

## 2018-04-04 ENCOUNTER — Other Ambulatory Visit: Payer: Self-pay | Admitting: Family Medicine

## 2018-04-04 ENCOUNTER — Encounter: Payer: Self-pay | Admitting: Family Medicine

## 2018-04-04 DIAGNOSIS — J40 Bronchitis, not specified as acute or chronic: Secondary | ICD-10-CM | POA: Insufficient documentation

## 2018-04-04 NOTE — Assessment & Plan Note (Signed)
Penicillin prescribed and pt to have a cXR

## 2018-04-04 NOTE — Assessment & Plan Note (Signed)
Uncontrolled and currently not taking medications which has made it worse. He is to resume med as prescribed, he is treated by endo Nicholas [name] is reminded of the importance of commitment to daily physical activity for 30 minutes or more, as able and the need to limit carbohydrate intake to 30 to 60 grams per meal to help with blood sugar control.   The need to take medication as prescribed, test blood sugar as directed, and to call between visits if there is a concern that blood sugar is uncontrolled is also discussed.   Nicholas [name] is reminded of the importance of daily foot exam, annual eye examination, and good blood sugar, blood pressure and cholesterol control.  Diabetic Labs Latest Ref Rng & Units 03/28/2018 01/10/2018 09/14/2017 07/23/2017 06/01/2017  HbA1c <5.7 % of total Hgb - 6.9(H) 7.6(H) - -  Microalbumin <2.0 mg/dL - - - - -  Micro/Creat Ratio 0.0 - 30.0 mg/g - - - - -  Chol <200 mg/dL - - - - 164  HDL >40 mg/dL - - - - 50  Calc LDL mg/dL (calc) - - - - 86  Triglycerides <150 mg/dL - - - - 181(H)  Creatinine 0.61 - 1.24 mg/dL 1.26(H) 1.19 1.14 1.13 -   BP/Weight 03/30/2018 03/28/2018 02/22/2018 01/25/2018 01/04/2018 12/24/2017 02/05/9832  Systolic BP 825 053 976 734 193 790 240  Diastolic BP 90 69 80 70 69 78 80  Wt. (Lbs) 422.04 425 432 455 - 438 432  BMI 57.24 57.64 58.59 61.71 - 64.68 63.8   Foot/eye exam completion dates Latest Ref Rng & Units 10/12/2017 05/29/2016  Eye Exam No Retinopathy No Retinopathy No Retinopathy  Foot exam Order - - -  Foot Form Completion - - -

## 2018-04-04 NOTE — Assessment & Plan Note (Signed)
Uncontrolled, dose hCTZ is increased and he will cntinue benazepril at current dose DASH diet and commitment to daily physical activity for a minimum of 30 minutes discussed and encouraged, as a part of hypertension management. The importance of attaining a healthy weight is also discussed.  BP/Weight 03/30/2018 03/28/2018 02/22/2018 01/25/2018 01/04/2018 12/24/2017 02/13/7115  Systolic BP 579 038 333 832 919 166 060  Diastolic BP 90 69 80 70 69 78 80  Wt. (Lbs) 422.04 425 432 455 - 438 432  BMI 57.24 57.64 58.59 61.71 - 64.68 63.8

## 2018-04-04 NOTE — Progress Notes (Signed)
Nicholas [name]     MRN: [id number]      DOB: [birth year]   HPI Nicholas [name] is here for follow up of recent ED visit on 09/30, when he presented with h/o falls  Which he relates is primarily due to change in his BP medication. I have had several calls prior to the visit, stating that he is confused and has d/c all medicationas thinks it is doing him harm, and not sure what to take. C/o sweats and dry cough, accompanied by friend who believes that he may also have walking pneumoniaThere are no specific complaints .  ROS  Denies sinus pressure, nasal congestion, ear pain or sore throat.  Denies chest pains, palpitations and leg swelling Denies abdominal pain, nausea, vomiting,diarrhea or constipation.   Denies dysuria, frequency, hesitancy or incontinence. C/o chronic uncontrolled joint pain, swelling and limitation in mobility. Denies headaches, seizures, numbness, or tingling. Denies uncontrolled depression, anxiety or insomnia. Denies skin break down or rash.   PE  BP (!) 160/90   Pulse 89   Resp 16   Ht 6' (1.829 m)   Wt (!) 422 lb 0.6 oz (191.4 kg)   SpO2 93% Comment: room air  BMI 57.24 kg/m   Patient alert and oriented and in no cardiopulmonary distress.Ill appearing  HEENT: No facial asymmetry, EOMI,   oropharynx pink and moist.  Neck supple no JVD, no mass.  Chest: decreased air entrt , scattered crackles, no wheezes CVS: S1, S2 no murmurs, no S3.Regular rate.  ABD: Soft non tender.   Ext: No edema  MS: decreased  ROM spine, shoulders, hips and knees.  Skin: Intact, no ulcerations or rash noted.  Psych: Good eye contact, normal affect. Memory intact not anxious or depressed appearing.  CNS: CN 2-12 intact, power,  normal throughout.no focal deficits noted.   Assessment & Plan Essential hypertension Uncontrolled, dose hCTZ is increased and he will cntinue benazepril at current dose DASH diet and commitment to daily physical activity for a minimum of 30  minutes discussed and encouraged, as a part of hypertension management. The importance of attaining a healthy weight is also discussed.  BP/Weight 03/30/2018 03/28/2018 02/22/2018 01/25/2018 01/04/2018 12/24/2017 9/38/1829  Systolic BP 937 169 678 938 101 751 025  Diastolic BP 90 69 80 70 69 78 80  Wt. (Lbs) 422.04 425 432 455 - 438 432  BMI 57.24 57.64 58.59 61.71 - 64.68 63.8       MORBID OBESITY unchamged Patient re-educated about  the importance of commitment to a  minimum of 150 minutes of exercise per week.  The importance of healthy food choices with portion control discussed. Encouraged to start a food diary, count calories and to consider  joining a support group. Sample diet sheets offered. Goals set by the patient for the next several months.   Weight /BMI 03/30/2018 03/28/2018 02/22/2018  WEIGHT 422 lb 0.6 oz 425 lb 432 lb  HEIGHT 6\' 0"  6\' 0"  6\' 0"   BMI 57.24 kg/m2 57.64 kg/m2 58.59 kg/m2      Bronchitis Penicillin prescribed and pt to have a cXR  Type 2 diabetes mellitus with diabetic nephropathy, without long-term current use of insulin ([place]) Uncontrolled and currently not taking medications which has made it worse. He is to resume med as prescribed, he is treated by endo Nicholas [name] is reminded of the importance of commitment to daily physical activity for 30 minutes or more, as able and the need to limit carbohydrate intake to 30 to  60 grams per meal to help with blood sugar control.   The need to take medication as prescribed, test blood sugar as directed, and to call between visits if there is a concern that blood sugar is uncontrolled is also discussed.   Nicholas [name] is reminded of the importance of daily foot exam, annual eye examination, and good blood sugar, blood pressure and cholesterol control.  Diabetic Labs Latest Ref Rng & Units 03/28/2018 01/10/2018 09/14/2017 07/23/2017 06/01/2017  HbA1c <5.7 % of total Hgb - 6.9(H) 7.6(H) - -  Microalbumin <2.0 mg/dL - -  - - -  Micro/Creat Ratio 0.0 - 30.0 mg/g - - - - -  Chol <200 mg/dL - - - - 164  HDL >40 mg/dL - - - - 50  Calc LDL mg/dL (calc) - - - - 86  Triglycerides <150 mg/dL - - - - 181(H)  Creatinine 0.61 - 1.24 mg/dL 1.26(H) 1.19 1.14 1.13 -   BP/Weight 03/30/2018 03/28/2018 02/22/2018 01/25/2018 01/04/2018 12/24/2017 07/13/5206  Systolic BP 022 336 122 449 753 005 110  Diastolic BP 90 69 80 70 69 78 80  Wt. (Lbs) 422.04 425 432 455 - 438 432  BMI 57.24 57.64 58.59 61.71 - 64.68 63.8   Foot/eye exam completion dates Latest Ref Rng & Units 10/12/2017 05/29/2016  Eye Exam No Retinopathy No Retinopathy No Retinopathy  Foot exam Order - - -  Foot Form Completion - - -

## 2018-04-04 NOTE — Assessment & Plan Note (Signed)
unchamged Patient re-educated about  the importance of commitment to a  minimum of 150 minutes of exercise per week.  The importance of healthy food choices with portion control discussed. Encouraged to start a food diary, count calories and to consider  joining a support group. Sample diet sheets offered. Goals set by the patient for the next several months.   Weight /BMI 03/30/2018 03/28/2018 02/22/2018  WEIGHT 422 lb 0.6 oz 425 lb 432 lb  HEIGHT 6\' 0"  6\' 0"  6\' 0"   BMI 57.24 kg/m2 57.64 kg/m2 58.59 kg/m2

## 2018-04-05 ENCOUNTER — Ambulatory Visit: Payer: Medicare HMO | Admitting: Sports Medicine

## 2018-04-05 ENCOUNTER — Telehealth: Payer: Self-pay | Admitting: Family Medicine

## 2018-04-05 ENCOUNTER — Telehealth: Payer: Self-pay

## 2018-04-05 DIAGNOSIS — E669 Obesity, unspecified: Principal | ICD-10-CM

## 2018-04-05 DIAGNOSIS — E1169 Type 2 diabetes mellitus with other specified complication: Secondary | ICD-10-CM

## 2018-04-05 NOTE — Telephone Encounter (Signed)
2nd attempt - Voicemail box is not set up

## 2018-04-05 NOTE — Telephone Encounter (Signed)
Have faxed info to Merrilee Seashore at [facility]

## 2018-04-05 NOTE — Telephone Encounter (Signed)
Patient left voicemail requesting to know if he qualifies for homehealth. Please call back to [phone]

## 2018-04-08 ENCOUNTER — Telehealth: Payer: Self-pay

## 2018-04-08 DIAGNOSIS — F341 Dysthymic disorder: Secondary | ICD-10-CM

## 2018-04-08 NOTE — BH Specialist Note (Signed)
[facility] Telephone Follow-up  MRN: [id number] NAME: Nicholas [name] Date: 04/08/18   Total time: 30 minutes Call number: 4/6  Reason for call today:    PHQ-9 Scores:  Depression screen [facility] 2/9 04/08/2018 03/30/2018 03/23/2018 02/24/2018 02/22/2018  Decreased Interest 1 1 1 2  0  Down, Depressed, Hopeless 1 1 1 1 1   PHQ - 2 Score 2 2 2 3 1   Altered sleeping 1 1 2 3 2   Tired, decreased energy 1 1 1 1 3   Change in appetite 0 1 0 0 2  Feeling bad or failure about yourself  1 0 0 1 1  Trouble concentrating 0 0 0 0 0  Moving slowly or fidgety/restless 0 0 0 0 3  Suicidal thoughts 0 0 0 0 0  PHQ-9 Score 5 5 5 8 12   Difficult doing work/chores Somewhat difficult - Somewhat difficult Somewhat difficult Somewhat difficult  Some recent data might be hidden   GAD-7 Scores:  GAD 7 : Generalized Anxiety Score 04/08/2018 03/23/2018 02/24/2018 01/07/2018  Nervous, Anxious, on Edge 0 0 0 1  Control/stop worrying 0 0 0 0  Worry too much - different things 0 0 0 1  Trouble relaxing 0 0 0 0  Restless 0 0 0 0  Easily annoyed or irritable 0 0 0 0  Afraid - awful might happen 0 0 0 0  Total GAD 7 Score 0 0 0 2  Anxiety Difficulty Not difficult at all Not difficult at all Not difficult at all Not difficult at all    Stress Current stressors:  Chronic pain  Sleep:  Varies Appetite:  Good Coping ability:  Normal  Patient taking medications as prescribed:    Current medications:  Outpatient Encounter Medications as of 04/08/2018  Medication Sig  . anastrozole (ARIMIDEX) 1 MG tablet Take 1 mg by mouth daily.  Marland Kitchen aspirin EC 81 MG tablet Take 81 mg by mouth every evening.   . benzonatate (TESSALON) 100 MG capsule Take 1 capsule (100 mg total) by mouth 2 (two) times daily as needed for cough.  . brimonidine-timolol (COMBIGAN) 0.2-0.5 % ophthalmic solution Place 1 drop into both eyes 2 (two) times daily.   Marland Kitchen buPROPion (WELLBUTRIN XL) 150 MG 24 hr tablet Take 1 tablet (150 mg total) by  mouth daily.  . citalopram (CELEXA) 40 MG tablet TAKE 1 TABLET BY MOUTH ONCE A DAY.  Marland Kitchen Continuous Blood Gluc Sensor (FREESTYLE LIBRE 14 DAY SENSOR) MISC Inject 1 each into the skin every 14 (fourteen) days. Use as directed.  . cyclobenzaprine (FLEXERIL) 10 MG tablet TAKE (1) TABLET BY MOUTH EVERY EIGHT HOURS AS NEEDED FOR MUSCLE SPASMS.  . cyclobenzaprine (FLEXERIL) 10 MG tablet TAKE (1) TABLET BY MOUTH EVERY EIGHT HOURS AS NEEDED FOR MUSCLE SPASMS.  Marland Kitchen FLUoxetine (PROZAC) 20 MG capsule TAKE 1 CAPSULE BY MOUTH DAILY.  Marland Kitchen gabapentin (NEURONTIN) 600 MG tablet TAKE ONE TABLET BY MOUTH 3 TIMES DAILY.  Marland Kitchen HUMULIN R U-500 KWIKPEN 500 UNIT/ML kwikpen INJECT UP TO 100 UNITS SUBCUTANEOUSLY 30 MINUTES BEFORE MEALS.  . hydrochlorothiazide (HYDRODIURIL) 25 MG tablet Take 1 tablet (25 mg total) by mouth daily.  Marland Kitchen ibuprofen (ADVIL,MOTRIN) 800 MG tablet Take 1 tablet (800 mg total) by mouth 3 (three) times daily.  . Insulin Pen Needle (SURE COMFORT PEN NEEDLES) 31G X 8 MM MISC Inject 1 each into the skin 4 (four) times daily.  Marland Kitchen ketoconazole (NIZORAL) 2 % shampoo Apply 1 application topically 2 (two) times a week.  Marland Kitchen  lamoTRIgine (LAMICTAL) 200 MG tablet TAKE 1 TABLET BY MOUTH ONCE A DAY.  Marland Kitchen levothyroxine (SYNTHROID, LEVOTHROID) 100 MCG tablet Take 1 tablet (100 mcg total) by mouth daily before breakfast.  . lisinopril (PRINIVIL,ZESTRIL) 10 MG tablet TAKE 1 TABLET BY MOUTH ONCE A DAY.  Marland Kitchen lisinopril (PRINIVIL,ZESTRIL) 5 MG tablet Take 1 tablet (5 mg total) by mouth daily.  . metoprolol succinate (TOPROL-XL) 25 MG 24 hr tablet TAKE (1) TABLET BY MOUTH ONCE DAILY.  . naproxen (NAPROSYN) 500 MG tablet TAKE ONE TABLET BY MOUTH TWICE DAILY WITH A MEAL.  Marland Kitchen NOVOFINE 30G X 8 MM MISC   . pantoprazole (PROTONIX) 40 MG tablet TAKE (1) TABLET BY MOUTH TWICE DAILY.  Marland Kitchen penicillin v potassium (VEETID) 500 MG tablet Take 1 tablet (500 mg total) by mouth 3 (three) times daily.  . ranitidine (ZANTAC) 150 MG capsule Take 1 capsule  (150 mg total) by mouth 2 (two) times daily.  . simvastatin (ZOCOR) 40 MG tablet TAKE 1 TABLET BY MOUTH ONCE A DAY.  Marland Kitchen testosterone cypionate (DEPOTESTOSTERONE CYPIONATE) 200 MG/ML injection   . traMADol (ULTRAM) 50 MG tablet TAKE 2 TABLETS BY MOUTH EVERY 8 HOURS AS NEEDED.  Marland Kitchen TRAVATAN Z 0.004 % SOLN ophthalmic solution Place 1 drop into both eyes at bedtime.   . triamcinolone ointment (KENALOG) 0.1 % APPLY TO AFFECTED AREAS TWICE DAILY. (Patient taking differently: APPLY TO AFFECTED AREAS TWICE DAILY AS NEEDED FOR DRY/ITCHY SKIN.)  . VICTOZA 18 MG/3ML SOPN INJECT 1.8 MG SUBCUTANEOUSLY ONCE DAILY.  Marland Kitchen Vitamin D, Ergocalciferol, (DRISDOL) 50000 units CAPS capsule TAKE ONE CAPSULE BY MOUTH ONCE WEEKLY.   Facility-Administered Encounter Medications as of 2018/04/24  Medication  . triamcinolone acetonide (KENALOG-40) injection 20 mg     Self-harm Behaviors Risk Assessment Self-harm risk factors:  None Patient endorses recent thoughts of harming self:  None   [place] Suicide Severity Rating Scale: No flowsheet data found. C-SRSS 01/07/2018 02/24/2018 04-24-18  1. Wish to be Dead No No No  2. Suicidal Thoughts No No No  6. Suicide Behavior Question No No No     Danger to Others Risk Assessment Danger to others risk factors:  NA Patient endorses recent thoughts of harming others:  None Reported     Substance Use Assessment Patient recently consumed alcohol:  NA  Alcohol Use Disorder Identification Test (AUDIT):  Alcohol Use Disorder Test (AUDIT) 01/07/2018 02/24/2018 2018/04/24  1. How often do you have a drink containing alcohol? 0 0 0  2. How many drinks containing alcohol do you have on a typical day when you are drinking? 0 0 0  3. How often do you have six or more drinks on one occasion? 0 0 0  AUDIT-C Score 0 0 0  Intervention/Follow-up AUDIT Score <7 follow-up not indicated AUDIT Score <7 follow-up not indicated AUDIT Score <7 follow-up not indicated   Patient recently used  drugs:  None Reported    Goals, Interventions and Follow-up Plan Goals: Increase healthy adjustment to current life circumstances Interventions: Brief CBT and Supportive Counseling Follow-up Plan: [facility] Phone Follow UP   Summary:   Follow UP - [place] PHQ and GAD score remains the same.  Patient denies any current stressors.  Patient reports that his only issue is with chronic pain and his need to have assistance with completing daily living activities.   Writer discussed mindfulness.  Patient reports deep breathing exercising are helpful.  Writer discuss the possibility of obtaining a home health care aid. Patient reports that he  is going to speak to his PCP regarding a referral to have a home health care aid to come to his home.  Patient reports that his psychiatric medication is working effectively for him.   Patient reports that he continues to spend time talking to friends on Facebook as a means of relaxation because he is not able to get out of his home a lot.   Patient denies SI/HI/Psychosis/Substance Abuse. If your symptoms worsen or you have thoughts of suicide/homicide, PLEASE SEEK IMMEDIATE MEDICAL ATTENTION.  You may always call:  National Suicide Hotline: 321-511-9695;  [place] Crisis Line: 219-741-7504;  [facility] in [place]: 636-815-1668.  These are available 24 hours a day, 7 days a week.  Writer discussed relapse prevention.  Writer discussed factors that caused deep depression in the past.  Writer actively listened as the patient verbilized effective coping skills that he has utilized in the past.    During the next session the writer will continue to work on relapse prevention .   Graciella Freer [place], LCAS-A

## 2018-04-12 ENCOUNTER — Other Ambulatory Visit: Payer: Self-pay | Admitting: "Endocrinology

## 2018-04-12 ENCOUNTER — Other Ambulatory Visit: Payer: Self-pay | Admitting: Family Medicine

## 2018-04-12 DIAGNOSIS — I10 Essential (primary) hypertension: Secondary | ICD-10-CM | POA: Diagnosis not present

## 2018-04-12 DIAGNOSIS — E1121 Type 2 diabetes mellitus with diabetic nephropathy: Secondary | ICD-10-CM | POA: Diagnosis not present

## 2018-04-12 DIAGNOSIS — R296 Repeated falls: Secondary | ICD-10-CM | POA: Diagnosis not present

## 2018-04-12 DIAGNOSIS — E1169 Type 2 diabetes mellitus with other specified complication: Secondary | ICD-10-CM | POA: Diagnosis not present

## 2018-04-12 DIAGNOSIS — Z6841 Body Mass Index (BMI) 40.0 and over, adult: Secondary | ICD-10-CM | POA: Diagnosis not present

## 2018-04-12 DIAGNOSIS — J209 Acute bronchitis, unspecified: Secondary | ICD-10-CM | POA: Diagnosis not present

## 2018-04-13 NOTE — Progress Notes (Signed)
PHQ 9, GAD 7 scores remain low. Nicholas [name] specialist to monitor monthly and discuss relapse prevention. Would recommend continue current dose of psychotropics for 6-9 months to avoid relapse in depression if he has less than two episodes of depression in the past. Will continue to monitor.

## 2018-04-14 DIAGNOSIS — I10 Essential (primary) hypertension: Secondary | ICD-10-CM | POA: Diagnosis not present

## 2018-04-14 DIAGNOSIS — E1121 Type 2 diabetes mellitus with diabetic nephropathy: Secondary | ICD-10-CM | POA: Diagnosis not present

## 2018-04-14 DIAGNOSIS — J209 Acute bronchitis, unspecified: Secondary | ICD-10-CM | POA: Diagnosis not present

## 2018-04-14 DIAGNOSIS — Z6841 Body Mass Index (BMI) 40.0 and over, adult: Secondary | ICD-10-CM | POA: Diagnosis not present

## 2018-04-14 DIAGNOSIS — E1169 Type 2 diabetes mellitus with other specified complication: Secondary | ICD-10-CM | POA: Diagnosis not present

## 2018-04-14 DIAGNOSIS — R296 Repeated falls: Secondary | ICD-10-CM | POA: Diagnosis not present

## 2018-04-19 ENCOUNTER — Telehealth: Payer: Self-pay | Admitting: Family Medicine

## 2018-04-19 DIAGNOSIS — R296 Repeated falls: Secondary | ICD-10-CM | POA: Diagnosis not present

## 2018-04-19 DIAGNOSIS — Z6841 Body Mass Index (BMI) 40.0 and over, adult: Secondary | ICD-10-CM | POA: Diagnosis not present

## 2018-04-19 DIAGNOSIS — I10 Essential (primary) hypertension: Secondary | ICD-10-CM | POA: Diagnosis not present

## 2018-04-19 DIAGNOSIS — E1169 Type 2 diabetes mellitus with other specified complication: Secondary | ICD-10-CM | POA: Diagnosis not present

## 2018-04-19 DIAGNOSIS — J209 Acute bronchitis, unspecified: Secondary | ICD-10-CM | POA: Diagnosis not present

## 2018-04-19 DIAGNOSIS — E1121 Type 2 diabetes mellitus with diabetic nephropathy: Secondary | ICD-10-CM | POA: Diagnosis not present

## 2018-04-19 NOTE — Telephone Encounter (Signed)
Nicholas [name] is calling in she needs verbal orders for Left Leg wound   To clean with Saline --please call back to advise

## 2018-04-19 NOTE — Telephone Encounter (Signed)
Verbal order given  

## 2018-04-21 ENCOUNTER — Encounter: Payer: Self-pay | Admitting: Family Medicine

## 2018-04-21 ENCOUNTER — Ambulatory Visit (INDEPENDENT_AMBULATORY_CARE_PROVIDER_SITE_OTHER): Payer: Medicare HMO | Admitting: Family Medicine

## 2018-04-21 VITALS — BP 138/70 | HR 93 | Resp 12 | Ht 72.0 in | Wt >= 6400 oz

## 2018-04-21 DIAGNOSIS — I10 Essential (primary) hypertension: Secondary | ICD-10-CM | POA: Diagnosis not present

## 2018-04-21 DIAGNOSIS — E1121 Type 2 diabetes mellitus with diabetic nephropathy: Secondary | ICD-10-CM | POA: Diagnosis not present

## 2018-04-21 DIAGNOSIS — Z125 Encounter for screening for malignant neoplasm of prostate: Secondary | ICD-10-CM

## 2018-04-21 DIAGNOSIS — E1151 Type 2 diabetes mellitus with diabetic peripheral angiopathy without gangrene: Secondary | ICD-10-CM

## 2018-04-21 DIAGNOSIS — E11628 Type 2 diabetes mellitus with other skin complications: Secondary | ICD-10-CM | POA: Diagnosis not present

## 2018-04-21 DIAGNOSIS — L84 Corns and callosities: Secondary | ICD-10-CM

## 2018-04-21 DIAGNOSIS — M5440 Lumbago with sciatica, unspecified side: Secondary | ICD-10-CM

## 2018-04-21 DIAGNOSIS — E039 Hypothyroidism, unspecified: Secondary | ICD-10-CM | POA: Diagnosis not present

## 2018-04-21 NOTE — Patient Instructions (Addendum)
Annual physical with MD in 4.5 months, call if you need me sooner  Continue same BP medication, mindful eating and your health will improve   You are referred to Dr Merlene Laughter for pain management, his office will call  Fasting lipid, cmp and EGFr and PSA to be done 1 week before next visit

## 2018-04-21 NOTE — Assessment & Plan Note (Signed)
Uncontrolled severe back pain with radiation to both legs, with weakness and numbness , rated at a 10,  Needs Pain management, will refer

## 2018-04-22 ENCOUNTER — Other Ambulatory Visit: Payer: Self-pay | Admitting: Gastroenterology

## 2018-04-22 LAB — COMPLETE METABOLIC PANEL WITH GFR
AG Ratio: 1.6 (calc) (ref 1.0–2.5)
ALBUMIN MSPROF: 3.8 g/dL (ref 3.6–5.1)
ALKALINE PHOSPHATASE (APISO): 105 U/L (ref 40–115)
ALT: 16 U/L (ref 9–46)
AST: 16 U/L (ref 10–35)
BILIRUBIN TOTAL: 0.3 mg/dL (ref 0.2–1.2)
BUN / CREAT RATIO: 17 (calc) (ref 6–22)
BUN: 23 mg/dL (ref 7–25)
CHLORIDE: 104 mmol/L (ref 98–110)
CO2: 26 mmol/L (ref 20–32)
Calcium: 9.3 mg/dL (ref 8.6–10.3)
Creat: 1.38 mg/dL — ABNORMAL HIGH (ref 0.70–1.25)
GFR, Est African American: 63 mL/min/{1.73_m2} (ref >=60)
GFR, Est Non African American: 54 mL/min/{1.73_m2} — ABNORMAL LOW (ref >=60)
GLUCOSE: 251 mg/dL — AB (ref 65–139)
Globulin: 2.4 g/dL (calc) (ref 1.9–3.7)
POTASSIUM: 5 mmol/L (ref 3.5–5.3)
SODIUM: 140 mmol/L (ref 135–146)
Total Protein: 6.2 g/dL (ref 6.1–8.1)

## 2018-04-22 LAB — HEMOGLOBIN A1C
EAG (MMOL/L): 9.2 (calc)
Hgb A1c MFr Bld: 7.4 % of total Hgb — ABNORMAL HIGH (ref <5.7)
MEAN PLASMA GLUCOSE: 166 (calc)

## 2018-04-22 LAB — T4, FREE: FREE T4: 0.8 ng/dL (ref 0.8–1.8)

## 2018-04-22 LAB — TSH: TSH: 2.38 mIU/L (ref 0.40–4.50)

## 2018-04-25 DIAGNOSIS — R296 Repeated falls: Secondary | ICD-10-CM | POA: Diagnosis not present

## 2018-04-25 DIAGNOSIS — Z6841 Body Mass Index (BMI) 40.0 and over, adult: Secondary | ICD-10-CM

## 2018-04-25 DIAGNOSIS — E1169 Type 2 diabetes mellitus with other specified complication: Secondary | ICD-10-CM | POA: Diagnosis not present

## 2018-04-25 DIAGNOSIS — I10 Essential (primary) hypertension: Secondary | ICD-10-CM | POA: Diagnosis not present

## 2018-04-25 DIAGNOSIS — E1121 Type 2 diabetes mellitus with diabetic nephropathy: Secondary | ICD-10-CM | POA: Diagnosis not present

## 2018-04-25 DIAGNOSIS — J209 Acute bronchitis, unspecified: Secondary | ICD-10-CM | POA: Diagnosis not present

## 2018-04-26 DIAGNOSIS — E1121 Type 2 diabetes mellitus with diabetic nephropathy: Secondary | ICD-10-CM | POA: Diagnosis not present

## 2018-04-26 DIAGNOSIS — E1169 Type 2 diabetes mellitus with other specified complication: Secondary | ICD-10-CM | POA: Diagnosis not present

## 2018-04-26 DIAGNOSIS — I10 Essential (primary) hypertension: Secondary | ICD-10-CM | POA: Diagnosis not present

## 2018-04-26 DIAGNOSIS — Z6841 Body Mass Index (BMI) 40.0 and over, adult: Secondary | ICD-10-CM | POA: Diagnosis not present

## 2018-04-26 DIAGNOSIS — R296 Repeated falls: Secondary | ICD-10-CM | POA: Diagnosis not present

## 2018-04-26 DIAGNOSIS — J209 Acute bronchitis, unspecified: Secondary | ICD-10-CM | POA: Diagnosis not present

## 2018-04-27 ENCOUNTER — Ambulatory Visit: Payer: Medicare HMO | Admitting: "Endocrinology

## 2018-04-27 ENCOUNTER — Other Ambulatory Visit: Payer: Self-pay | Admitting: Family Medicine

## 2018-04-27 ENCOUNTER — Encounter: Payer: Self-pay | Admitting: "Endocrinology

## 2018-04-27 ENCOUNTER — Encounter: Payer: Self-pay | Admitting: Internal Medicine

## 2018-04-27 VITALS — BP 160/72 | HR 97 | Ht 72.0 in | Wt >= 6400 oz

## 2018-04-27 DIAGNOSIS — E039 Hypothyroidism, unspecified: Secondary | ICD-10-CM | POA: Diagnosis not present

## 2018-04-27 DIAGNOSIS — E1121 Type 2 diabetes mellitus with diabetic nephropathy: Secondary | ICD-10-CM

## 2018-04-27 DIAGNOSIS — E782 Mixed hyperlipidemia: Secondary | ICD-10-CM | POA: Diagnosis not present

## 2018-04-27 DIAGNOSIS — E559 Vitamin D deficiency, unspecified: Secondary | ICD-10-CM

## 2018-04-27 DIAGNOSIS — I10 Essential (primary) hypertension: Secondary | ICD-10-CM

## 2018-04-27 MED ORDER — LEVOTHYROXINE SODIUM 125 MCG PO TABS: 125.0000 ug | ORAL_TABLET | Freq: Every day | ORAL | 6 refills | Status: AC

## 2018-04-27 MED ORDER — INSULIN REGULAR HUMAN (CONC) 500 UNIT/ML ~~LOC~~ SOPN: 80.0000 [IU] | PEN_INJECTOR | Freq: Three times a day (TID) | SUBCUTANEOUS | 0 refills | Status: DC

## 2018-04-27 NOTE — Progress Notes (Signed)
Endocrinology follow-up note  Subjective:    Patient ID: Nicholas [name], male    DOB: March 09, 1956,    Past Medical History:  Diagnosis Date  . Chronic knee pain   . Depression   . Diabetes ([place])   . Glaucoma   . Hyperlipidemia   . Hypertension   . MRSA (methicillin resistant Staphylococcus aureus)    patient denies  . Neuropathy   . Poor circulation    leg   . Suicide attempt ([place])   . Testosterone deficiency    Past Surgical History:  Procedure Laterality Date  . CHOLECYSTECTOMY N/A 02/25/2015   Procedure: LAPAROSCOPIC CHOLECYSTECTOMY;  Surgeon: Aviva Signs, MD;  Location: [facility];  Service: General;  Laterality: N/A;  . COLONOSCOPY  03/2008   TLX:BWIOMB external hemorrhoidal tag, otherwise normal rectum/Two diminutive rectosigmoid polyps s/p bx/ Polyp in the opposite the ileocecal valve s/p bx. tubular adenoma.  . COLONOSCOPY N/A 03/23/2014   TDH:RCBULAGT colon polyps throughout his rectum and colon. Sessile and pedunculated. The largest polyp was proximally 6 mm in dimensions in the retum at 3 cm with adjacent diminutive polyp.  There was also a polyps on ileocecal valve, ascending segmen descending and sigmoid segment. as the colon was tortuous & elongated requiring externa abd pressure and changing of the pts postion to reach cecum  . COLONOSCOPY WITH PROPOFOL N/A 07/29/2017   Procedure: COLONOSCOPY WITH PROPOFOL;  Surgeon: Daneil Dolin, MD;  Location: [facility];  Service: Endoscopy;  Laterality: N/A;  12:30pm  . ESOPHAGOGASTRODUODENOSCOPY N/A 03/23/2014   RMR: 1. Patient had circumferential distal esophageal erosions within 5 mm of the GE junction.  No Barrett's esophagus. Tubular esophagus patent throughout its course. 2.  Diffuse gastric erosions. (1) 81mm area of healing ulceration in the antrum. No ulcer or infiltrating process observed. Patent pylorus. Normal-appearing first and second portion of the duodenum .  . kidney stones     cysto with litholapexy  .  POLYPECTOMY  07/29/2017   Procedure: POLYPECTOMY;  Surgeon: Daneil Dolin, MD;  Location: [facility];  Service: Endoscopy;;  colon    Social History   Socioeconomic History  . Marital status: Divorced    Spouse name: Not on file  . Number of children: 0  . Years of education: 86  . Highest education level: Not on file  Occupational History  . Occupation: DISABLED    Employer: UNEMPLOYED  Social Needs  . Financial resource strain: Not on file  . Food insecurity:    Worry: Sometimes true    Inability: Sometimes true  . Transportation needs:    Medical: No    Non-medical: No  Tobacco Use  . Smoking status: Former Smoker    Packs/day: 1.50    Years: 25.00    Pack years: 37.50    Types: Cigarettes    Last attempt to quit: 02/20/2002    Years since quitting: 16.1  . Smokeless tobacco: Former Systems developer    Types: Snuff    Quit date: 11/02/1980  . Tobacco comment: Quit x 15 years  Substance and Sexual Activity  . Alcohol use: No  . Drug use: No  . Sexual activity: Not Currently    Birth control/protection: None  Lifestyle  . Physical activity:    Days per week: 0 days    Minutes per session: 0 min  . Stress: To some extent  Relationships  . Social connections:    Talks on phone: More than three times a week  Gets together: More than three times a week    Attends religious service: More than 4 times per year    Active member of club or organization: Yes    Attends meetings of clubs or organizations: More than 4 times per year    Relationship status: Divorced  Other Topics Concern  . Not on file  Social History Narrative  . Not on file   Outpatient Encounter Medications as of 04/27/2018  Medication Sig  . anastrozole (ARIMIDEX) 1 MG tablet Take 1 mg by mouth daily.  Marland Kitchen aspirin EC 81 MG tablet Take 81 mg by mouth every evening.   . benzonatate (TESSALON) 100 MG capsule Take 1 capsule (100 mg total) by mouth 2 (two) times daily as needed for cough.  .  brimonidine-timolol (COMBIGAN) 0.2-0.5 % ophthalmic solution Place 1 drop into both eyes 2 (two) times daily.   Marland Kitchen buPROPion (WELLBUTRIN XL) 150 MG 24 hr tablet Take 1 tablet (150 mg total) by mouth daily.  . citalopram (CELEXA) 40 MG tablet TAKE 1 TABLET BY MOUTH ONCE A DAY.  Marland Kitchen Continuous Blood Gluc Sensor (FREESTYLE LIBRE 14 DAY SENSOR) MISC Inject 1 each into the skin every 14 (fourteen) days. Use as directed.  . cyclobenzaprine (FLEXERIL) 10 MG tablet TAKE (1) TABLET BY MOUTH EVERY EIGHT HOURS AS NEEDED FOR MUSCLE SPASMS.  Marland Kitchen FLUoxetine (PROZAC) 20 MG capsule TAKE 1 CAPSULE BY MOUTH DAILY.  Marland Kitchen gabapentin (NEURONTIN) 600 MG tablet TAKE ONE TABLET BY MOUTH 3 TIMES DAILY.  . hydrochlorothiazide (HYDRODIURIL) 25 MG tablet Take 1 tablet (25 mg total) by mouth daily.  Marland Kitchen ibuprofen (ADVIL,MOTRIN) 800 MG tablet Take 1 tablet (800 mg total) by mouth 3 (three) times daily.  . Insulin Pen Needle (SURE COMFORT PEN NEEDLES) 31G X 8 MM MISC Inject 1 each into the skin 4 (four) times daily.  . insulin regular human CONCENTRATED (HUMULIN R U-500 KWIKPEN) 500 UNIT/ML kwikpen Inject 80 Units into the skin 3 (three) times daily before meals.  Marland Kitchen ketoconazole (NIZORAL) 2 % shampoo Apply 1 application topically 2 (two) times a week.  . lamoTRIgine (LAMICTAL) 200 MG tablet TAKE 1 TABLET BY MOUTH ONCE A DAY.  Marland Kitchen levothyroxine (SYNTHROID, LEVOTHROID) 100 MCG tablet Take 1 tablet (100 mcg total) by mouth daily before breakfast.  . lisinopril (PRINIVIL,ZESTRIL) 5 MG tablet Take 1 tablet (5 mg total) by mouth daily.  . metoprolol succinate (TOPROL-XL) 25 MG 24 hr tablet TAKE (1) TABLET BY MOUTH ONCE DAILY.  . naproxen (NAPROSYN) 500 MG tablet TAKE ONE TABLET BY MOUTH TWICE DAILY WITH A MEAL.  Marland Kitchen NOVOFINE 30G X 8 MM MISC   . pantoprazole (PROTONIX) 40 MG tablet TAKE (1) TABLET BY MOUTH TWICE DAILY.  Marland Kitchen penicillin v potassium (VEETID) 500 MG tablet Take 1 tablet (500 mg total) by mouth 3 (three) times daily.  . ranitidine  (ZANTAC) 150 MG capsule Take 1 capsule (150 mg total) by mouth 2 (two) times daily.  . simvastatin (ZOCOR) 40 MG tablet TAKE 1 TABLET BY MOUTH ONCE A DAY.  Marland Kitchen testosterone cypionate (DEPOTESTOSTERONE CYPIONATE) 200 MG/ML injection   . traMADol (ULTRAM) 50 MG tablet TAKE 2 TABLETS BY MOUTH EVERY 8 HOURS AS NEEDED.  Marland Kitchen TRAVATAN Z 0.004 % SOLN ophthalmic solution Place 1 drop into both eyes at bedtime.   . triamcinolone ointment (KENALOG) 0.1 % APPLY TO AFFECTED AREAS TWICE DAILY. (Patient taking differently: APPLY TO AFFECTED AREAS TWICE DAILY AS NEEDED FOR DRY/ITCHY SKIN.)  . VICTOZA 18 MG/3ML SOPN INJECT  1.8 MG SUBCUTANEOUSLY ONCE DAILY.  . [DISCONTINUED] HUMULIN R U-500 KWIKPEN 500 UNIT/ML kwikpen INJECT UP TO 100 UNITS SUBCUTANEOUSLY 30 MINUTES BEFORE MEALS. (Patient taking differently: 80 units qam 70 units at lunch, 70 units supper)  . [DISCONTINUED] Vitamin D, Ergocalciferol, (DRISDOL) 50000 units CAPS capsule TAKE ONE CAPSULE BY MOUTH ONCE WEEKLY.   Facility-Administered Encounter Medications as of 04/27/2018  Medication  . triamcinolone acetonide (KENALOG-40) injection 20 mg   ALLERGIES: Allergies  Allergen Reactions  . No Allergies On File   . No Known Allergies    VACCINATION STATUS: Immunization History  Administered Date(s) Administered  . Influenza Whole 04/01/2010  . Influenza,inj,Quad PF,6+ Mos 03/08/2013  . Pneumococcal Conjugate-13 07/09/2014  . Pneumococcal Polysaccharide-23 04/01/2010, 10/07/2015  . Tdap 11/24/2011, 03/28/2018  . Zoster 12/12/2014    Diabetes  He presents for his follow-up diabetic visit. He has type 2 diabetes mellitus. His disease course has been worsening. There are no hypoglycemic associated symptoms. Pertinent negatives for hypoglycemia include no confusion, headaches, pallor or seizures. There are no diabetic associated symptoms. Pertinent negatives for diabetes include no chest pain, no fatigue, no polydipsia, no polyphagia, no polyuria and no  weakness. There are no hypoglycemic complications. Symptoms are worsening. Diabetic complications include impotence, peripheral neuropathy and PVD. Risk factors for coronary artery disease include diabetes mellitus, dyslipidemia, family history, hypertension, male sex, obesity, sedentary lifestyle and tobacco exposure. He is compliant with treatment most of the time. His weight is increasing steadily. He is following a generally unhealthy diet. He never participates in exercise. His home blood glucose trend is decreasing steadily. His breakfast blood glucose range is generally 140-180 mg/dl. His lunch blood glucose range is generally 140-180 mg/dl. His dinner blood glucose range is generally 140-180 mg/dl. His bedtime blood glucose range is generally 140-180 mg/dl. His overall blood glucose range is 140-180 mg/dl. An ACE inhibitor/angiotensin II receptor blocker is being taken. Eye exam is current.  Hyperlipidemia  This is a chronic problem. The current episode started more than 1 year ago. The problem is controlled. Exacerbating diseases include diabetes and obesity. Pertinent negatives include no chest pain, myalgias or shortness of breath. Current antihyperlipidemic treatment includes statins. Risk factors for coronary artery disease include a sedentary lifestyle, male sex, diabetes mellitus, dyslipidemia and obesity.  Hypertension  The current episode started more than 1 year ago. The problem is controlled. Pertinent negatives include no chest pain, headaches, neck pain, palpitations or shortness of breath. Risk factors for coronary artery disease include dyslipidemia, diabetes mellitus, obesity, sedentary lifestyle and smoking/tobacco exposure. Past treatments include ACE inhibitors. Hypertensive end-organ damage includes PVD.     Review of Systems  Constitutional: Negative for fatigue and unexpected weight change.  HENT: Negative for dental problem, mouth sores and trouble swallowing.   Eyes:  Negative for visual disturbance.  Respiratory: Negative for cough, choking, chest tightness, shortness of breath and wheezing.   Cardiovascular: Negative for chest pain, palpitations and leg swelling.  Gastrointestinal: Negative for abdominal distention, abdominal pain, constipation, diarrhea, nausea and vomiting.  Endocrine: Negative for polydipsia, polyphagia and polyuria.  Genitourinary: Positive for impotence. Negative for dysuria, flank pain, hematuria and urgency.  Musculoskeletal: Negative for back pain, gait problem, myalgias and neck pain.  Skin: Negative for pallor, rash and wound.  Neurological: Negative for seizures, syncope, weakness, numbness and headaches.  Psychiatric/Behavioral: Negative.  Negative for confusion and dysphoric mood.    Objective:    BP (!) 160/72   Pulse 97   Ht 6' (1.829 m)  Wt (!) 423 lb (191.9 kg)   BMI 57.37 kg/m   Wt Readings from Last 3 Encounters:  04/27/18 (!) 423 lb (191.9 kg)  04/21/18 (!) 423 lb (191.9 kg)  03/30/18 (!) 422 lb 0.6 oz (191.4 kg)    Physical Exam  Constitutional: He is oriented to person, place, and time. He appears well-developed. He is cooperative. No distress.  HENT:  Head: Normocephalic and atraumatic.  Eyes: EOM are normal.  Neck: Normal range of motion. Neck supple. No tracheal deviation present. No thyromegaly present.  Cardiovascular: Normal rate, S1 normal and S2 normal. Exam reveals no gallop.  No murmur heard. Pulses:      Dorsalis pedis pulses are 0 on the right side, and 0 on the left side.       Posterior tibial pulses are 0 on the right side, and 0 on the left side.  Pulmonary/Chest: Effort normal. No respiratory distress. He has no wheezes.  Abdominal: He exhibits no distension. There is no tenderness. There is no guarding and no CVA tenderness.  + Obese.  Musculoskeletal: He exhibits no edema.       Right shoulder: He exhibits no swelling and no deformity.  Large lower extremities with skin dryness,  nonexistent to poor circulation.  Uses a walker to get around.  Neurological: He is alert and oriented to person, place, and time. He has normal strength. No cranial nerve deficit or sensory deficit. Gait normal.  Skin: Skin is warm and dry. No rash noted. No cyanosis. Nails show no clubbing.  He has extensive tattoos.  Psychiatric: He has a normal mood and affect. His speech is normal. Cognition and memory are normal.    Results for orders placed or performed during the hospital encounter of 03/28/18  Comprehensive metabolic panel  Result Value Ref Range   Sodium 137 135 - 145 mmol/L   Potassium 4.4 3.5 - 5.1 mmol/L   Chloride 101 98 - 111 mmol/L   CO2 26 22 - 32 mmol/L   Glucose, Bld 235 (H) 70 - 99 mg/dL   BUN 16 8 - 23 mg/dL   Creatinine, Ser 1.26 (H) 0.61 - 1.24 mg/dL   Calcium 8.7 (L) 8.9 - 10.3 mg/dL   Total Protein 6.3 (L) 6.5 - 8.1 g/dL   Albumin 3.5 3.5 - 5.0 g/dL   AST 27 15 - 41 U/L   ALT 25 0 - 44 U/L   Alkaline Phosphatase 81 38 - 126 U/L   Total Bilirubin 1.0 0.3 - 1.2 mg/dL   GFR calc non Af Amer 59 (L) >60 mL/min   GFR calc Af Amer >60 >60 mL/min   Anion gap 10 5 - 15  CBC with Differential  Result Value Ref Range   WBC 11.8 (H) 4.0 - 10.5 K/uL   RBC 5.64 4.22 - 5.81 MIL/uL   Hemoglobin 15.0 13.0 - 17.0 g/dL   HCT 47.2 39.0 - 52.0 %   MCV 83.7 78.0 - 100.0 fL   MCH 26.6 26.0 - 34.0 pg   MCHC 31.8 30.0 - 36.0 g/dL   RDW 18.0 (H) 11.5 - 15.5 %   Platelets 244 150 - 400 K/uL   Neutrophils Relative % 71 %   Neutro Abs 8.5 (H) 1.7 - 7.7 K/uL   Lymphocytes Relative 12 %   Lymphs Abs 1.4 0.7 - 4.0 K/uL   Monocytes Relative 9 %   Monocytes Absolute 1.1 (H) 0.1 - 1.0 K/uL   Eosinophils Relative 7 %   Eosinophils  Absolute 0.8 (H) 0.0 - 0.7 K/uL   Basophils Relative 1 %   Basophils Absolute 0.1 0.0 - 0.1 K/uL  CBG monitoring, ED  Result Value Ref Range   Glucose-Capillary 227 (H) 70 - 99 mg/dL  Diabetic Labs (most recent): Lab Results  Component Value Date    HGBA1C 7.4 (H) 04/21/2018   HGBA1C 6.9 (H) 01/10/2018   HGBA1C 7.6 (H) 09/14/2017   Lipid Panel     Component Value Date/Time   CHOL 164 06/01/2017 0759   TRIG 181 (H) 06/01/2017 0759   HDL 50 06/01/2017 0759   CHOLHDL 3.3 06/01/2017 0759   VLDL 35 (H) 08/11/2016 0748   LDLCALC 86 06/01/2017 0759    Assessment & Plan:   1. Type 2 diabetes mellitus with pressure callus ([facility])  His diabetes is complicated by morbid obesity/sedentary life, PAD, peripheral neuropathy, with history of of diabetic foot ulcer.  Patient came with A1C of 7.4% increasing from 6.9%, no reported or documented hypoglycemia while using insulin U5003 times a day with meals.     - Patient remains at extremely high risk for more acute and chronic complications of diabetes which include CAD, CVA, CKD, retinopathy, and neuropathy. These are all discussed in detail with the patient.  - I have re-counseled the patient on diet management and weight loss  by adopting a carbohydrate restricted / protein rich  Diet.  - Patient is advised to stick to a routine mealtimes to eat 3 meals  a day and avoid unnecessary snacks ( to snack only to correct hypoglycemia).  -He admits to dietary indiscretion which is causing significant weight gain.  -  Suggestion is made for him to avoid simple carbohydrates  from his diet including Cakes, Sweet Desserts / Pastries, Ice Cream, Soda (diet and regular), Sweet Tea, Candies, Chips, Cookies, Store Bought Juices, Alcohol in Excess of  1-2 drinks a day, Artificial Sweeteners, and "Sugar-free" Products. This will help patient to have stable blood glucose profile and potentially avoid unintended weight gain.  - I have approached patient with the following individualized plan to manage diabetes and patient agrees.  -Given his presentation with above target postprandial glycemic profile, he is advised to increase his  Humulin U500 to   80 units before breakfast, 80 units before lunch, and 80  units before supper  for pre-meal blood glucose readings above 90 mg/dL associated with strict monitoring of glucose 4 times a day-before meals and at bedtime. -His insurance did not provide coverage for his CGM device. - Patient is warned not to take insulin without proper monitoring per orders. -Patient is encouraged to call clinic for blood glucose levels less than 70 or above 300 mg /dl. - I advised him to continue Victoza 1.8 mg subcutaneous daily, therapeutically suitable for patient. -Patient does not tolerate metformin. - Patient specific target  for A1c; LDL, HDL, Triglycerides, and  Waist Circumference were discussed in detail.  2) BP/HTN: His blood pressure is not controlled to target.  I approached him for higher dose of lisinopril, however he insists that higher dose of this medication causes dizziness and a tendency to fall.  He wishes to stay on his current medications including lisinopril 5 mg p.o. daily, metoprolol 25 mg p.o. daily, along with hydrochlorothiazide 25 mg p.o. daily.  I advised him to measure blood pressure at home and if readings are greater than 140/90 call PMD.    3) Lipids/HPL: His lipid profile is controlled LDL at 82.  He is advised  to continue simvastatin 40 mg p.o. nightly.   4)  Weight/Diet: CDE consult in progress, he cannot exercise optimally.     5) hypothyroidism:   -His thyroid function tests are consistent with appropriate replacement, however, he would still benefit from slight increase in his levothyroxine dose.    -I will proceed to increase his levothyroxine to 125 mcg p.o. every morning.  - We discussed about correct intake of levothyroxine, at fasting, with water, separated by at least 30 minutes from breakfast, and separated by more than 4 hours from calcium, iron, multivitamins, acid reflux medications (PPIs). -Patient is made aware of the fact that thyroid hormone replacement is needed for life, dose to be adjusted by periodic monitoring of  thyroid function tests.   6) Chronic Care/Health Maintenance:  -Patient is on ACEI and Statin medications and encouraged to continue to follow up with Ophthalmology, Podiatrist at least yearly or according to recommendations, and advised to  stay away from smoking. I have recommended yearly flu vaccine and pneumonia vaccination at least every 5 years; moderate intensity exercise for up to 150 minutes weekly; and  sleep for at least 7 hours a day.  I advised patient to maintain close follow up with his PCP for primary care needs.  - Time spent with the patient: 25 min, of which >50% was spent in reviewing his blood glucose logs , discussing his hypo- and hyper-glycemic episodes, reviewing his current and  previous labs and insulin doses and developing a plan to avoid hypo- and hyper-glycemia. Please refer to Patient Instructions for Blood Glucose Monitoring and Insulin/Medications Dosing Guide"  in media tab for additional information. Nicholas [name] participated in the discussions, expressed understanding, and voiced agreement with the above plans.  All questions were answered to his satisfaction. he is encouraged to contact clinic should he have any questions or concerns prior to his return visit.    Follow up plan: Return in about 4 months (around 08/27/2018) for Follow up with Pre-visit Labs, Meter, and Logs.  Glade Lloyd, MD Phone: [phone]  Fax: [phone]  This note was partially dictated with voice recognition software. Similar sounding words can be transcribed inadequately or may not  be corrected upon review.  04/27/2018, 11:01 AM

## 2018-04-27 NOTE — Patient Instructions (Signed)

## 2018-05-03 ENCOUNTER — Ambulatory Visit: Payer: Medicare HMO | Admitting: Sports Medicine

## 2018-05-03 ENCOUNTER — Encounter: Payer: Self-pay | Admitting: Sports Medicine

## 2018-05-03 VITALS — BP 130/70 | HR 80 | Resp 16

## 2018-05-03 DIAGNOSIS — E1140 Type 2 diabetes mellitus with diabetic neuropathy, unspecified: Secondary | ICD-10-CM

## 2018-05-03 DIAGNOSIS — E11610 Type 2 diabetes mellitus with diabetic neuropathic arthropathy: Secondary | ICD-10-CM | POA: Diagnosis not present

## 2018-05-03 DIAGNOSIS — B351 Tinea unguium: Secondary | ICD-10-CM | POA: Diagnosis not present

## 2018-05-03 DIAGNOSIS — I739 Peripheral vascular disease, unspecified: Secondary | ICD-10-CM

## 2018-05-03 DIAGNOSIS — E1149 Type 2 diabetes mellitus with other diabetic neurological complication: Secondary | ICD-10-CM

## 2018-05-03 DIAGNOSIS — L97511 Non-pressure chronic ulcer of other part of right foot limited to breakdown of skin: Secondary | ICD-10-CM

## 2018-05-03 DIAGNOSIS — M79676 Pain in unspecified toe(s): Secondary | ICD-10-CM | POA: Diagnosis not present

## 2018-05-03 NOTE — Progress Notes (Signed)
Patient ID: Nicholas [name], male   DOB: May 30, 1956, 62 y.o.   MRN: [id number]  Subjective: Nicholas [name] is a 62 y.o. Diabetic male patient seen in office for follow up of wound at right 1st toe, states that he does not know how the wound is healing because of the thick callus but has been applying betadine to area when he can wrap it; admits to some bloody drainage sometimes, Denies any other symptoms.   Patient is also here for nail care. Denies any other issues. FBS this AM was 137 and last A1C was 7.4.   Patient Active Problem List   Diagnosis Date Noted  . Vitamin D deficiency 04/27/2018  . Bronchitis 04/04/2018  . Back pain of lumbar region with sciatica 02/22/2018  . Headache 10/22/2017  . Depression, major, single episode, moderate ([place]) 10/22/2017  . Type 2 diabetes mellitus with diabetic nephropathy, without long-term current use of insulin ([place]) 06/23/2017  . Neck pain on left side 04/10/2017  . Dental caries 04/10/2017  . Hypothyroidism 08/19/2016  . GERD (gastroesophageal reflux disease) 04/14/2016  . Charcot foot due to diabetes mellitus ([place]) 01/28/2016  . Benign essential tremor 01/28/2016  . Skin lesion of back 10/07/2015  . Mixed hyperlipidemia 04/10/2015  . Benign hypertension 04/10/2015  . Abdominal wall bulge 01/15/2015  . Hx of adenomatous colonic polyps 02/21/2014  . Allergic rhinitis 07/24/2013  . Onychomycosis 05/21/2013  . Type 2 diabetes mellitus with pressure callus ([place]) 05/21/2013  . Back pain with radiation 04/20/2011  . Seborrheic dermatitis 11/20/2010  . Dermatomycosis 11/20/2010  . FATIGUE 04/01/2010  . CARPAL TUNNEL SYNDROME, BILATERAL 03/24/2010  . Hypogonadism in male 01/27/2010  . DEPRESSION/ANXIETY 01/27/2010  . CHF 01/13/2010  . DYSPNEA 01/13/2010  . Diabetes mellitus type 2 in obese ([place]) 12/12/2009  . MORBID OBESITY 12/12/2009  . Essential hypertension 12/12/2009  . PVD (peripheral vascular disease) ([place]) 12/12/2009    Current Outpatient Medications on File Prior to Visit  Medication Sig Dispense Refill  . anastrozole (ARIMIDEX) 1 MG tablet Take 1 mg by mouth daily.    Marland Kitchen aspirin EC 81 MG tablet Take 81 mg by mouth every evening.     . benzonatate (TESSALON) 100 MG capsule Take 1 capsule (100 mg total) by mouth 2 (two) times daily as needed for cough. 14 capsule 0  . brimonidine-timolol (COMBIGAN) 0.2-0.5 % ophthalmic solution Place 1 drop into both eyes 2 (two) times daily.     Marland Kitchen buPROPion (WELLBUTRIN XL) 150 MG 24 hr tablet Take 1 tablet (150 mg total) by mouth daily. 30 tablet 3  . citalopram (CELEXA) 40 MG tablet TAKE 1 TABLET BY MOUTH ONCE A DAY. 90 tablet 0  . Continuous Blood Gluc Sensor (FREESTYLE LIBRE 14 DAY SENSOR) MISC Inject 1 each into the skin every 14 (fourteen) days. Use as directed. 2 each 2  . cyclobenzaprine (FLEXERIL) 10 MG tablet TAKE (1) TABLET BY MOUTH EVERY EIGHT HOURS AS NEEDED FOR MUSCLE SPASMS. 30 tablet 0  . FLUoxetine (PROZAC) 20 MG capsule TAKE 1 CAPSULE BY MOUTH DAILY. 30 capsule 6  . gabapentin (NEURONTIN) 600 MG tablet TAKE ONE TABLET BY MOUTH 3 TIMES DAILY. 270 tablet 2  . hydrochlorothiazide (HYDRODIURIL) 25 MG tablet Take 1 tablet (25 mg total) by mouth daily. 30 tablet 3  . ibuprofen (ADVIL,MOTRIN) 800 MG tablet Take 1 tablet (800 mg total) by mouth 3 (three) times daily. 30 tablet 0  . Insulin Pen Needle (SURE COMFORT PEN NEEDLES) 31G X 8 MM  MISC Inject 1 each into the skin 4 (four) times daily. 200 each 2  . insulin regular human CONCENTRATED (HUMULIN R U-500 KWIKPEN) 500 UNIT/ML kwikpen Inject 80 Units into the skin 3 (three) times daily before meals. 18 mL 0  . ketoconazole (NIZORAL) 2 % shampoo Apply 1 application topically 2 (two) times a week. 120 mL 3  . lamoTRIgine (LAMICTAL) 200 MG tablet TAKE 1 TABLET BY MOUTH ONCE A DAY. 30 tablet 3  . levothyroxine (SYNTHROID, LEVOTHROID) 125 MCG tablet Take 1 tablet (125 mcg total) by mouth daily before breakfast. 30 tablet 6   . lisinopril (PRINIVIL,ZESTRIL) 5 MG tablet Take 1 tablet (5 mg total) by mouth daily. 90 tablet 3  . metoprolol succinate (TOPROL-XL) 25 MG 24 hr tablet TAKE (1) TABLET BY MOUTH ONCE DAILY. 90 tablet 3  . naproxen (NAPROSYN) 500 MG tablet TAKE ONE TABLET BY MOUTH TWICE DAILY WITH A MEAL. 60 tablet 0  . NOVOFINE 30G X 8 MM MISC     . pantoprazole (PROTONIX) 40 MG tablet TAKE (1) TABLET BY MOUTH TWICE DAILY. 60 tablet 3  . penicillin v potassium (VEETID) 500 MG tablet Take 1 tablet (500 mg total) by mouth 3 (three) times daily. 30 tablet 0  . ranitidine (ZANTAC) 150 MG capsule Take 1 capsule (150 mg total) by mouth 2 (two) times daily. 30 capsule 0  . simvastatin (ZOCOR) 40 MG tablet TAKE 1 TABLET BY MOUTH ONCE A DAY. 90 tablet 2  . testosterone cypionate (DEPOTESTOSTERONE CYPIONATE) 200 MG/ML injection     . traMADol (ULTRAM) 50 MG tablet TAKE 2 TABLETS BY MOUTH EVERY 8 HOURS AS NEEDED. 180 tablet 0  . TRAVATAN Z 0.004 % SOLN ophthalmic solution Place 1 drop into both eyes at bedtime.     . triamcinolone ointment (KENALOG) 0.1 % APPLY TO AFFECTED AREAS TWICE DAILY. (Patient taking differently: APPLY TO AFFECTED AREAS TWICE DAILY AS NEEDED FOR DRY/ITCHY SKIN.) 454 g 0  . VICTOZA 18 MG/3ML SOPN INJECT 1.8 MG SUBCUTANEOUSLY ONCE DAILY. 9 mL 2   Current Facility-Administered Medications on File Prior to Visit  Medication Dose Route Frequency Provider Last Rate Last Dose  . triamcinolone acetonide (KENALOG-40) injection 20 mg  20 mg Other Once Landis Martins, DPM       Allergies  Allergen Reactions  . No Allergies On File   . No Known Allergies     Recent Results (from the past 2160 hour(s))  Comprehensive metabolic panel     Status: Abnormal   Collection Time: 03/28/18 11:04 AM  Result Value Ref Range   Sodium 137 135 - 145 mmol/L   Potassium 4.4 3.5 - 5.1 mmol/L   Chloride 101 98 - 111 mmol/L   CO2 26 22 - 32 mmol/L   Glucose, Bld 235 (H) 70 - 99 mg/dL   BUN 16 8 - 23 mg/dL    Creatinine, Ser 1.26 (H) 0.61 - 1.24 mg/dL   Calcium 8.7 (L) 8.9 - 10.3 mg/dL   Total Protein 6.3 (L) 6.5 - 8.1 g/dL   Albumin 3.5 3.5 - 5.0 g/dL   AST 27 15 - 41 U/L   ALT 25 0 - 44 U/L   Alkaline Phosphatase 81 38 - 126 U/L   Total Bilirubin 1.0 0.3 - 1.2 mg/dL   GFR calc non Af Amer 59 (L) >60 mL/min   GFR calc Af Amer >60 >60 mL/min    Comment: (NOTE) The eGFR has been calculated using the CKD EPI equation. This calculation  has not been validated in all clinical situations. eGFR's persistently <60 mL/min signify possible Chronic Kidney Disease.    Anion gap 10 5 - 15    Comment: Performed at [facility], [address], [place], [place] [address]  CBC with Differential     Status: Abnormal   Collection Time: 03/28/18 11:04 AM  Result Value Ref Range   WBC 11.8 (H) 4.0 - 10.5 K/uL   RBC 5.64 4.22 - 5.81 MIL/uL   Hemoglobin 15.0 13.0 - 17.0 g/dL   HCT 47.2 39.0 - 52.0 %   MCV 83.7 78.0 - 100.0 fL   MCH 26.6 26.0 - 34.0 pg   MCHC 31.8 30.0 - 36.0 g/dL   RDW 18.0 (H) 11.5 - 15.5 %   Platelets 244 150 - 400 K/uL   Neutrophils Relative % 71 %   Neutro Abs 8.5 (H) 1.7 - 7.7 K/uL   Lymphocytes Relative 12 %   Lymphs Abs 1.4 0.7 - 4.0 K/uL   Monocytes Relative 9 %   Monocytes Absolute 1.1 (H) 0.1 - 1.0 K/uL   Eosinophils Relative 7 %   Eosinophils Absolute 0.8 (H) 0.0 - 0.7 K/uL   Basophils Relative 1 %   Basophils Absolute 0.1 0.0 - 0.1 K/uL    Comment: Performed at [facility], [address] [place], [place] [address]  CBG monitoring, ED     Status: Abnormal   Collection Time: 03/28/18 11:26 AM  Result Value Ref Range   Glucose-Capillary 227 (H) 70 - 99 mg/dL  Hemoglobin A1c     Status: Abnormal   Collection Time: 04/21/18  9:45 AM  Result Value Ref Range   Hgb A1c MFr Bld 7.4 (H) <5.7 % of total Hgb    Comment: For someone without known diabetes, a hemoglobin A1c value of 6.5% or greater indicates that they may have  diabetes and this should be confirmed with a  follow-up  test. . For someone with known diabetes, a value <7% indicates  that their diabetes is well controlled and a value  greater than or equal to 7% indicates suboptimal  control. A1c targets should be individualized based on  duration of diabetes, age, comorbid conditions, and  other considerations. . Currently, no consensus exists regarding use of hemoglobin A1c for diagnosis of diabetes for children. .    Mean Plasma Glucose 166 (calc)   eAG (mmol/L) 9.2 (calc)  T4, free     Status: None   Collection Time: 04/21/18  9:45 AM  Result Value Ref Range   Free T4 0.8 0.8 - 1.8 ng/dL  TSH     Status: None   Collection Time: 04/21/18  9:45 AM  Result Value Ref Range   TSH 2.38 0.40 - 4.50 mIU/L  COMPLETE METABOLIC PANEL WITH GFR     Status: Abnormal   Collection Time: 04/21/18  9:45 AM  Result Value Ref Range   Glucose, Bld 251 (H) 65 - 139 mg/dL    Comment: .        Non-fasting reference interval .    BUN 23 7 - 25 mg/dL   Creat 1.38 (H) 0.70 - 1.25 mg/dL    Comment: For patients >64 years of age, the reference limit for Creatinine is approximately 13% higher for people identified as African-American. .    GFR, Est Non African American 54 (L) > OR = 60 mL/min/1.25m   GFR, Est African American 63 > OR = 60 mL/min/1.784m  BUN/Creatinine Ratio 17 6 -  22 (calc)   Sodium 140 135 - 146 mmol/L   Potassium 5.0 3.5 - 5.3 mmol/L   Chloride 104 98 - 110 mmol/L   CO2 26 20 - 32 mmol/L   Calcium 9.3 8.6 - 10.3 mg/dL   Total Protein 6.2 6.1 - 8.1 g/dL   Albumin 3.8 3.6 - 5.1 g/dL   Globulin 2.4 1.9 - 3.7 g/dL (calc)   AG Ratio 1.6 1.0 - 2.5 (calc)   Total Bilirubin 0.3 0.2 - 1.2 mg/dL   Alkaline phosphatase (APISO) 105 40 - 115 U/L   AST 16 10 - 35 U/L   ALT 16 9 - 46 U/L    Objective: There were no vitals filed for this visit.  General: Patient is awake, alert, oriented x 3 and in no acute distress.  Dermatology: Skin is warm and dry bilateral with a partial  thickness ulceration measures 0.3x0.2x0.1cm with granular base at right hallux with surrounding reactive keratosis no signs of acute infection at site. Nails are elongated and thickened consistent with mycosis. Abrasion to right shin without infection.    Vascular: Dorsalis Pedis pulse = 0/4 Bilateral,  Posterior Tibial pulse = 1/4 Bilateral faint,  Capillary Fill Time < 5 seconds, + chronic venous skin changes bilateral.  Neurologic:Protective sensation diminished using the 5.07/10g Semmes Weinstein Monofilament. Vibratory diminished bilateral.   Musculosketal:  Chronic pain to plantar medial and dorsal midfoot on left with accompanying prominent bones supportive of Charcot. No Pain with palpation to ulcerated area at right hallux. + Hallux interphalangeus R>L hallux, No pain with compression to calves bilateral.   Assessment and Plan:  Problem List Items Addressed This Visit      Cardiovascular and Mediastinum   PVD (peripheral vascular disease) ([place])     Endocrine   Charcot foot due to diabetes mellitus ([place])    Other Visit Diagnoses    Dermatophytosis of nail    -  Primary   Toe ulcer, right, limited to breakdown of skin ([place])       Type II diabetes mellitus with neurological manifestations ([place])       Diabetic neuropathy with neurologic complication ([place])         -Examined patient  -Mechanically debrided nails x 10 using sterile nail nipper without incident and debrided ulceration at right 1st toe using sterile chisel blade and dressed with betadine. Advised patient to do the same until healed as previous -Continue with offloading pad to right insole as previous; Patient to see rick for new custom molded shoe/boot -Continue with gabapentin  -Patient to return to office in 10-12 weeks for nail trim and wound check on right or sooner if problems arise.  Landis Martins, DPM

## 2018-05-05 ENCOUNTER — Other Ambulatory Visit: Payer: Self-pay

## 2018-05-05 DIAGNOSIS — M545 Low back pain: Principal | ICD-10-CM

## 2018-05-05 DIAGNOSIS — G8929 Other chronic pain: Secondary | ICD-10-CM

## 2018-05-05 MED ORDER — TRAMADOL HCL 50 MG PO TABS: ORAL_TABLET | ORAL | 0 refills | Status: DC

## 2018-05-13 ENCOUNTER — Other Ambulatory Visit: Payer: Self-pay | Admitting: Family Medicine

## 2018-05-16 ENCOUNTER — Other Ambulatory Visit: Payer: Self-pay | Admitting: Family Medicine

## 2018-05-16 ENCOUNTER — Other Ambulatory Visit: Payer: Self-pay | Admitting: "Endocrinology

## 2018-05-18 ENCOUNTER — Telehealth: Payer: Self-pay

## 2018-05-18 DIAGNOSIS — E291 Testicular hypofunction: Secondary | ICD-10-CM | POA: Diagnosis not present

## 2018-05-18 NOTE — Telephone Encounter (Signed)
[facility] - Left Message  

## 2018-05-19 ENCOUNTER — Other Ambulatory Visit: Payer: Self-pay | Admitting: Family Medicine

## 2018-05-25 ENCOUNTER — Ambulatory Visit: Payer: Medicare HMO | Admitting: Urology

## 2018-05-29 ENCOUNTER — Encounter: Payer: Self-pay | Admitting: Family Medicine

## 2018-05-29 NOTE — Assessment & Plan Note (Signed)
Controlled, no change in medication` DASH diet and commitment to daily physical activity for a minimum of 30 minutes discussed and encouraged, as a part of hypertension management. The importance of attaining a healthy weight is also discussed.  BP/Weight 05/03/2018 04/27/2018 04/21/2018 03/30/2018 03/28/2018 02/22/2018 7/61/6073  Systolic BP 710 626 948 546 270 350 093  Diastolic BP 70 72 70 90 69 80 70  Wt. (Lbs) - 423 423 422.04 425 432 455  BMI - 57.37 57.37 57.24 57.64 58.59 61.71

## 2018-05-29 NOTE — Assessment & Plan Note (Signed)
Mr. Grisso is reminded of the importance of commitment to daily physical activity for 30 minutes or more, as able and the need to limit carbohydrate intake to 30 to 60 grams per meal to help with blood sugar control.  Controlled, no change in medication   The need to take medication as prescribed, test blood sugar as directed, and to call between visits if there is a concern that blood sugar is uncontrolled is also discussed.   Mr. Stebner is reminded of the importance of daily foot exam, annual eye examination, and good blood sugar, blood pressure and cholesterol control.  Diabetic Labs Latest Ref Rng & Units 04/21/2018 03/28/2018 01/10/2018 09/14/2017 07/23/2017  HbA1c <5.7 % of total Hgb 7.4(H) - 6.9(H) 7.6(H) -  Microalbumin <2.0 mg/dL - - - - -  Micro/Creat Ratio 0.0 - 30.0 mg/g - - - - -  Chol <200 mg/dL - - - - -  HDL >40 mg/dL - - - - -  Calc LDL mg/dL (calc) - - - - -  Triglycerides <150 mg/dL - - - - -  Creatinine 0.70 - 1.25 mg/dL 1.38(H) 1.26(H) 1.19 1.14 1.13   BP/Weight 05/03/2018 04/27/2018 04/21/2018 03/30/2018 03/28/2018 02/22/2018 4/66/5993  Systolic BP 570 177 939 030 092 330 076  Diastolic BP 70 72 70 90 69 80 70  Wt. (Lbs) - 423 423 422.04 425 432 455  BMI - 57.37 57.37 57.24 57.64 58.59 61.71   Foot/eye exam completion dates Latest Ref Rng & Units 10/12/2017 05/29/2016  Eye Exam No Retinopathy No Retinopathy No Retinopathy  Foot exam Order - - -  Foot Form Completion - - -

## 2018-05-29 NOTE — Assessment & Plan Note (Signed)
Deteriorated. Patient re-educated about  the importance of commitment to a  minimum of 150 minutes of exercise per week.  The importance of healthy food choices with portion control discussed. Encouraged to start a food diary, count calories and to consider  joining a support group. Sample diet sheets offered. Goals set by the patient for the next several months.   Weight /BMI 04/27/2018 04/21/2018 03/30/2018  WEIGHT 423 lb 423 lb 422 lb 0.6 oz  HEIGHT 6\' 0"  6\' 0"  6\' 0"   BMI 57.37 kg/m2 57.37 kg/m2 57.24 kg/m2

## 2018-05-29 NOTE — Progress Notes (Signed)
Nicholas [name]     MRN: [id number]      DOB: [birth year]   HPI Nicholas [name] is here for follow up and re-evaluation of chronic medical conditions, medication management and review of any available recent lab and radiology data.  Preventive health is updated, specifically  Cancer screening and Immunization.   Questions or concerns regarding consultations or procedures which the PT has had in the interim are  addressed. The PT denies any adverse reactions to current medications since the last visit.  C/o uncontrolled back pain on current regime , requests pain clinic eval  ROS Denies recent fever or chills. Denies sinus pressure, nasal congestion, ear pain or sore throat. Denies chest congestion, productive cough or wheezing. Denies chest pains, palpitations Denies abdominal pain, nausea, vomiting,diarrhea or constipation.   Denies dysuria, frequency, hesitancy or incontinence.  Denies headaches, seizures, numbness, or tingling. Denies uncontrolled  depression, anxiety or insomnia.    PE  BP 138/70   Pulse 93   Resp 12   Ht 6' (1.829 m)   Wt (!) 423 lb (191.9 kg)   SpO2 90% Comment: room air  BMI 57.37 kg/m   Patient alert and oriented and in no cardiopulmonary distress.  HEENT: No facial asymmetry, EOMI,   oropharynx pink and moist.  Neck supple no JVD, no mass.  Chest: Clear to auscultation bilaterally.  CVS: S1, S2 no murmurs, no S3.Regular rate.  ABD: Soft non tender.   Ext: one plus edema  MS: decreased  ROM spine, shoulders, hips and knees.  Skin: Intact, no ulcerations or rash noted.  Psych: Good eye contact, normal affect. Memory intact not anxious or depressed appearing.  CNS: CN 2-12 intact, power,  normal throughout.no focal deficits noted.   Assessment & Plan  Back pain of lumbar region with sciatica Uncontrolled severe back pain with radiation to both legs, with weakness and numbness , rated at a 10,  Needs Pain management, will refer  Benign  hypertension Controlled, no change in medication` DASH diet and commitment to daily physical activity for a minimum of 30 minutes discussed and encouraged, as a part of hypertension management. The importance of attaining a healthy weight is also discussed.  BP/Weight 05/03/2018 04/27/2018 04/21/2018 03/30/2018 03/28/2018 02/22/2018 6/33/3545  Systolic BP 625 638 937 342 876 811 572  Diastolic BP 70 72 70 90 69 80 70  Wt. (Lbs) - 423 423 422.04 425 432 455  BMI - 57.37 57.37 57.24 57.64 58.59 61.71       MORBID OBESITY Deteriorated. Patient re-educated about  the importance of commitment to a  minimum of 150 minutes of exercise per week.  The importance of healthy food choices with portion control discussed. Encouraged to start a food diary, count calories and to consider  joining a support group. Sample diet sheets offered. Goals set by the patient for the next several months.   Weight /BMI 04/27/2018 04/21/2018 03/30/2018  WEIGHT 423 lb 423 lb 422 lb 0.6 oz  HEIGHT 6\' 0"  6\' 0"  6\' 0"   BMI 57.37 kg/m2 57.37 kg/m2 57.24 kg/m2      Type 2 diabetes mellitus with diabetic nephropathy, without long-term current use of insulin ([place]) Nicholas [name] is reminded of the importance of commitment to daily physical activity for 30 minutes or more, as able and the need to limit carbohydrate intake to 30 to 60 grams per meal to help with blood sugar control.  Controlled, no change in medication   The need to take medication  as prescribed, test blood sugar as directed, and to call between visits if there is a concern that blood sugar is uncontrolled is also discussed.   Nicholas [name] is reminded of the importance of daily foot exam, annual eye examination, and good blood sugar, blood pressure and cholesterol control.  Diabetic Labs Latest Ref Rng & Units 04/21/2018 03/28/2018 01/10/2018 09/14/2017 07/23/2017  HbA1c <5.7 % of total Hgb 7.4(H) - 6.9(H) 7.6(H) -  Microalbumin <2.0 mg/dL - - - - -    Micro/Creat Ratio 0.0 - 30.0 mg/g - - - - -  Chol <200 mg/dL - - - - -  HDL >40 mg/dL - - - - -  Calc LDL mg/dL (calc) - - - - -  Triglycerides <150 mg/dL - - - - -  Creatinine 0.70 - 1.25 mg/dL 1.38(H) 1.26(H) 1.19 1.14 1.13   BP/Weight 05/03/2018 04/27/2018 04/21/2018 03/30/2018 03/28/2018 02/22/2018 1/61/0960  Systolic BP 454 098 119 147 829 562 130  Diastolic BP 70 72 70 90 69 80 70  Wt. (Lbs) - 423 423 422.04 425 432 455  BMI - 57.37 57.37 57.24 57.64 58.59 61.71   Foot/eye exam completion dates Latest Ref Rng & Units 10/12/2017 05/29/2016  Eye Exam No Retinopathy No Retinopathy No Retinopathy  Foot exam Order - - -  Foot Form Completion - - -

## 2018-05-30 ENCOUNTER — Telehealth: Payer: Self-pay | Admitting: Licensed Clinical Social Worker

## 2018-05-30 DIAGNOSIS — F321 Major depressive disorder, single episode, moderate: Secondary | ICD-10-CM

## 2018-05-30 NOTE — BH Specialist Note (Signed)
[place] Telephone Follow-up Left message encouraging contact  Lubertha South, LCSW

## 2018-06-06 ENCOUNTER — Other Ambulatory Visit: Payer: Self-pay | Admitting: Family Medicine

## 2018-06-07 ENCOUNTER — Ambulatory Visit: Payer: Medicare HMO | Admitting: Sports Medicine

## 2018-06-10 ENCOUNTER — Telehealth: Payer: Self-pay

## 2018-06-10 NOTE — Telephone Encounter (Signed)
4th attempt [facility]  - Voice mail is not set up and unable to leave a message.   Several attempts have been made to contact patient without success. The patient has an elevated PHQ score. This patient will be placed on the inactive list.    Information will be routed to the PCP and Dr. Modesta Messing

## 2018-06-12 ENCOUNTER — Other Ambulatory Visit: Payer: Self-pay | Admitting: Family Medicine

## 2018-06-15 ENCOUNTER — Other Ambulatory Visit: Payer: Self-pay

## 2018-06-15 DIAGNOSIS — R69 Illness, unspecified: Secondary | ICD-10-CM | POA: Diagnosis not present

## 2018-06-15 MED ORDER — GLUCOSE BLOOD VI STRP: 1.0000 | ORAL_STRIP | Freq: Four times a day (QID) | 5 refills | Status: AC

## 2018-06-17 ENCOUNTER — Other Ambulatory Visit: Payer: Self-pay | Admitting: Family Medicine

## 2018-06-20 ENCOUNTER — Other Ambulatory Visit: Payer: Self-pay | Admitting: "Endocrinology

## 2018-06-23 ENCOUNTER — Other Ambulatory Visit: Payer: Self-pay | Admitting: Family Medicine

## 2018-06-30 ENCOUNTER — Other Ambulatory Visit: Payer: Self-pay | Admitting: "Endocrinology

## 2018-07-12 ENCOUNTER — Ambulatory Visit: Payer: Medicare HMO | Admitting: Sports Medicine

## 2018-07-12 ENCOUNTER — Other Ambulatory Visit: Payer: Self-pay | Admitting: Family Medicine

## 2018-07-12 ENCOUNTER — Ambulatory Visit: Payer: Medicare HMO | Admitting: Internal Medicine

## 2018-07-15 ENCOUNTER — Ambulatory Visit: Payer: Medicare HMO | Admitting: Urology

## 2018-07-15 ENCOUNTER — Encounter: Payer: Self-pay | Admitting: Internal Medicine

## 2018-07-15 ENCOUNTER — Ambulatory Visit: Payer: Medicare HMO | Admitting: Internal Medicine

## 2018-07-15 VITALS — BP 145/91 | HR 129 | Temp 97.0°F | Ht 72.0 in | Wt >= 6400 oz

## 2018-07-15 DIAGNOSIS — K219 Gastro-esophageal reflux disease without esophagitis: Secondary | ICD-10-CM

## 2018-07-15 DIAGNOSIS — R6882 Decreased libido: Secondary | ICD-10-CM

## 2018-07-15 DIAGNOSIS — E291 Testicular hypofunction: Secondary | ICD-10-CM | POA: Diagnosis not present

## 2018-07-15 DIAGNOSIS — R69 Illness, unspecified: Secondary | ICD-10-CM | POA: Diagnosis not present

## 2018-07-15 DIAGNOSIS — Z86010 Personal history of colonic polyps: Secondary | ICD-10-CM

## 2018-07-15 NOTE — Progress Notes (Signed)
Primary Care Physician:  Fayrene Helper, MD Primary Gastroenterologist:  Dr. Gala Romney   Pre-Procedure History & Physical: HPI:  Nicholas [name] is a 63 y.o. male here for one-year follow-up GERD.  Protonix 40 mg twice daily is controlling his symptoms very well.  No dysphagia.  Reflux esophagitis without Barrett's epithelium seen on prior EGD.  He remains super morbidly obese with a BMI 58.  History of multiple colonic adenomas removed 1 year ago; surveillance due 2 years from now.  He does have a number of non-GI comorbidities.    He has not tried to back off of the Protonix to once daily lately.  Past Medical History:  Diagnosis Date  . Chronic knee pain   . Depression   . Diabetes ([place])   . Glaucoma   . Hyperlipidemia   . Hypertension   . MRSA (methicillin resistant Staphylococcus aureus)    patient denies  . Neuropathy   . Poor circulation    leg   . Suicide attempt (Nicholas [name])   . Testosterone deficiency     Past Surgical History:  Procedure Laterality Date  . CHOLECYSTECTOMY N/A 02/25/2015   Procedure: LAPAROSCOPIC CHOLECYSTECTOMY;  Surgeon: Aviva Signs, MD;  Location: [facility];  Service: General;  Laterality: N/A;  . COLONOSCOPY  03/2008   QJJ:HERDEY external hemorrhoidal tag, otherwise normal rectum/Two diminutive rectosigmoid polyps s/p bx/ Polyp in the opposite the ileocecal valve s/p bx. tubular adenoma.  . COLONOSCOPY N/A 03/23/2014   CXK:GYJEHUDJ colon polyps throughout his rectum and colon. Sessile and pedunculated. The largest polyp was proximally 6 mm in dimensions in the retum at 3 cm with adjacent diminutive polyp.  There was also a polyps on ileocecal valve, ascending segmen descending and sigmoid segment. as the colon was tortuous & elongated requiring externa abd pressure and changing of the pts postion to reach cecum  . COLONOSCOPY WITH PROPOFOL N/A 07/29/2017   Procedure: COLONOSCOPY WITH PROPOFOL;  Surgeon: Daneil Dolin, MD;  Location: [facility];   Service: Endoscopy;  Laterality: N/A;  12:30pm  . ESOPHAGOGASTRODUODENOSCOPY N/A 03/23/2014   RMR: 1. Patient had circumferential distal esophageal erosions within 5 mm of the GE junction.  No Barrett's esophagus. Tubular esophagus patent throughout its course. 2.  Diffuse gastric erosions. (1) 50mm area of healing ulceration in the antrum. No ulcer or infiltrating process observed. Patent pylorus. Normal-appearing first and second portion of the duodenum .  . kidney stones     cysto with litholapexy  . POLYPECTOMY  07/29/2017   Procedure: POLYPECTOMY;  Surgeon: Daneil Dolin, MD;  Location: [facility];  Service: Endoscopy;;  colon     Prior to Admission medications   Medication Sig Start Date End Date Taking? Authorizing Provider  anastrozole (ARIMIDEX) 1 MG tablet Take 1 mg by mouth daily.   Yes [provider]  aspirin EC 81 MG tablet Take 81 mg by mouth every evening.    Yes [provider]  benzonatate (TESSALON) 100 MG capsule Take 1 capsule (100 mg total) by mouth 2 (two) times daily as needed for cough. 03/30/18  Yes Fayrene Helper, MD  brimonidine-timolol (COMBIGAN) 0.2-0.5 % ophthalmic solution Place 1 drop into both eyes 2 (two) times daily.    Yes [provider]  buPROPion (WELLBUTRIN XL) 150 MG 24 hr tablet TAKE 1 TABLET BY MOUTH ONCE A DAY. 05/19/18  Yes Fayrene Helper, MD  citalopram (CELEXA) 40 MG tablet TAKE 1 TABLET BY MOUTH ONCE A DAY.  05/19/18  Yes Fayrene Helper, MD  Continuous Blood Gluc Sensor (FREESTYLE LIBRE 14 DAY SENSOR) MISC Inject 1 each into the skin every 14 (fourteen) days. Use as directed. 09/23/17  Yes Nida, Marella Chimes, MD  cyclobenzaprine (FLEXERIL) 10 MG tablet TAKE (1) TABLET BY MOUTH EVERY EIGHT HOURS AS NEEDED FOR MUSCLE SPASMS. 03/07/18  Yes Fayrene Helper, MD  cyclobenzaprine (FLEXERIL) 10 MG tablet TAKE (1) TABLET BY MOUTH EVERY EIGHT HOURS AS NEEDED FOR MUSCLE SPASMS. 06/06/18  Yes Fayrene Helper, MD   cyclobenzaprine (FLEXERIL) 10 MG tablet TAKE (1) TABLET BY MOUTH EVERY EIGHT HOURS AS NEEDED FOR MUSCLE SPASMS. 07/12/18  Yes Fayrene Helper, MD  FLUoxetine (PROZAC) 20 MG capsule TAKE 1 CAPSULE BY MOUTH DAILY. 03/16/18  Yes Fayrene Helper, MD  gabapentin (NEURONTIN) 600 MG tablet TAKE ONE TABLET BY MOUTH 3 TIMES DAILY. 03/07/18  Yes Fayrene Helper, MD  glucose blood test strip 1 each by Other route 4 (four) times daily. Use as instructed 4 x daily. Verio. E11.65 06/15/18  Yes Nida, Marella Chimes, MD  HUMULIN R U-500 KWIKPEN 500 UNIT/ML kwikpen INJECT UP TO 100 UNITS SUBCUTANEOUSLY 30 MINUTES BEFORE MEALS. Patient taking differently: Inject 80 Units into the skin 3 (three) times daily with meals.  06/23/18  Yes Nida, Marella Chimes, MD  hydrochlorothiazide (HYDRODIURIL) 25 MG tablet TAKE 1 CAPSULE BY MOUTH DAILY. 06/23/18  Yes Fayrene Helper, MD  ibuprofen (ADVIL,MOTRIN) 800 MG tablet Take 1 tablet (800 mg total) by mouth 3 (three) times daily. 02/22/18  Yes Fayrene Helper, MD  Insulin Pen Needle (SURE COMFORT PEN NEEDLES) 31G X 8 MM MISC Inject 1 each into the skin 4 (four) times daily. 10/06/17  Yes Nida, Marella Chimes, MD  ketoconazole (NIZORAL) 2 % shampoo Apply 1 application topically 2 (two) times a week. 12/20/17  Yes Fayrene Helper, MD  lamoTRIgine (LAMICTAL) 200 MG tablet TAKE 1 TABLET BY MOUTH ONCE A DAY. 06/17/18  Yes Fayrene Helper, MD  levothyroxine (SYNTHROID, LEVOTHROID) 125 MCG tablet Take 1 tablet (125 mcg total) by mouth daily before breakfast. 04/27/18  Yes Nida, Marella Chimes, MD  lisinopril (PRINIVIL,ZESTRIL) 5 MG tablet Take 1 tablet (5 mg total) by mouth daily. 03/30/18  Yes Fayrene Helper, MD  metoprolol succinate (TOPROL-XL) 25 MG 24 hr tablet TAKE (1) TABLET BY MOUTH ONCE DAILY. 04/28/18  Yes Fayrene Helper, MD  naproxen (NAPROSYN) 500 MG tablet TAKE ONE TABLET BY MOUTH TWICE DAILY WITH A MEAL. 06/13/18  Yes Fayrene Helper, MD   NOVOFINE 30G X 8 MM MISC  05/20/16  Yes [provider]  pantoprazole (PROTONIX) 40 MG tablet TAKE (1) TABLET BY MOUTH TWICE DAILY. 04/23/18  Yes Annitta Needs, NP  penicillin v potassium (VEETID) 500 MG tablet Take 1 tablet (500 mg total) by mouth 3 (three) times daily. 03/30/18  Yes Fayrene Helper, MD  ranitidine (ZANTAC) 150 MG capsule Take 1 capsule (150 mg total) by mouth 2 (two) times daily. 02/22/18  Yes Fayrene Helper, MD  simvastatin (ZOCOR) 40 MG tablet TAKE 1 TABLET BY MOUTH ONCE A DAY. 04/12/18  Yes Fayrene Helper, MD  testosterone cypionate (DEPOTESTOSTERONE CYPIONATE) 200 MG/ML injection  10/19/17  Yes [provider]  traMADol (ULTRAM) 50 MG tablet TAKE 2 TABLETS BY MOUTH EVERY 8 HOURS AS NEEDED. 05/05/18  Yes Fayrene Helper, MD  TRAVATAN Z 0.004 % SOLN ophthalmic solution Place 1 drop into both eyes at bedtime.  08/30/15  Yes [provider]  triamcinolone ointment (KENALOG) 0.1 % APPLY TO AFFECTED AREAS TWICE DAILY. Patient taking differently: APPLY TO AFFECTED AREAS TWICE DAILY AS NEEDED FOR DRY/ITCHY SKIN. 04/06/17  Yes Nida, Marella Chimes, MD  VICTOZA 18 MG/3ML SOPN INJECT 1.8 MG SUBCUTANEOUSLY ONCE DAILY. 07/01/18  Yes Nida, Marella Chimes, MD  Vitamin D, Ergocalciferol, (DRISDOL) 1.25 MG (50000 UT) CAPS capsule TAKE ONE CAPSULE BY MOUTH ONCE WEEKLY. 05/16/18  Yes Cassandria Anger, MD    Allergies as of 07/15/2018 - Review Complete 07/15/2018  Allergen Reaction Noted  . No allergies on file  07/23/2017  . No known allergies  07/29/2017    Family History  Problem Relation Age of Onset  . Diabetes Mother   . Leukemia Mother   . Cancer Father        colon, age greater than 106?  Marland Kitchen Heart disease Father   . Glaucoma Father   . Parkinsonism Father        brain hemorrhage  . Alzheimer's disease Father   . Glaucoma Sister   . Dementia Sister   . Colon cancer Neg Hx     Social History   Socioeconomic History  . Marital  status: Divorced    Spouse name: Not on file  . Number of children: 0  . Years of education: 61  . Highest education level: Not on file  Occupational History  . Occupation: DISABLED    Employer: UNEMPLOYED  Social Needs  . Financial resource strain: Not on file  . Food insecurity:    Worry: Sometimes true    Inability: Sometimes true  . Transportation needs:    Medical: No    Non-medical: No  Tobacco Use  . Smoking status: Former Smoker    Packs/day: 1.50    Years: 25.00    Pack years: 37.50    Types: Cigarettes    Last attempt to quit: 02/20/2002    Years since quitting: 16.4  . Smokeless tobacco: Former Systems developer    Types: Snuff    Quit date: 11/02/1980  . Tobacco comment: Quit x 15 years  Substance and Sexual Activity  . Alcohol use: No  . Drug use: No  . Sexual activity: Not Currently    Birth control/protection: None  Lifestyle  . Physical activity:    Days per week: 0 days    Minutes per session: 0 min  . Stress: To some extent  Relationships  . Social connections:    Talks on phone: More than three times a week    Gets together: More than three times a week    Attends religious service: More than 4 times per year    Active member of club or organization: Yes    Attends meetings of clubs or organizations: More than 4 times per year    Relationship status: Divorced  . Intimate partner violence:    Fear of current or ex partner: No    Emotionally abused: No    Physically abused: No    Forced sexual activity: No  Other Topics Concern  . Not on file  Social History Narrative  . Not on file    Review of Systems: See HPI, otherwise negative ROS  Physical Exam: BP (!) 145/91   Pulse (!) 129   Temp (!) 97 F (36.1 C) (Oral)   Ht 6' (1.829 m)   Wt (!) 441 lb (200 kg)   BMI 59.81 kg/m  General: Somewhat disheveled.  Chronically ill-appearing ambulates with  a walker.   Neck:  Supple; no masses or thyromegaly. No significant cervical adenopathy. Lungs:  Clear  throughout to auscultation.   No wheezes, crackles, or rhonchi. No acute distress. Heart:  Regular rate and rhythm; no murmurs, clicks, rubs,  or gallops. Abdomen: Massively obese.  Soft and nontender.    Impression/Plan: Super morbidly obese gentleman with longstanding GERD/reflux esophagitis. Symptoms well controlled on twice daily Protonix.  History of colonic adenomas-due for surveillance 2 years from now.  Recommendations:  GERD information provided  Continue Protonix daily; may try to decrease to once daily- if symptoms not well controlled, would go back to twice a day treatment  Repeat colonoscopy in 2 years  Office visit in 1 year   Notice: This dictation was prepared with Dragon dictation along with smaller phrase technology. Any transcriptional errors that result from this process are unintentional and may not be corrected upon review.

## 2018-07-15 NOTE — Patient Instructions (Signed)
GERD information provided  Continue Protonix daily; may try to decrease to once daily- if symptoms not well controlled, would go back to twice a day treatment  Repeat colonoscopy in 2 years  Office visit in 1 year

## 2018-07-16 ENCOUNTER — Other Ambulatory Visit: Payer: Self-pay | Admitting: Family Medicine

## 2018-07-18 ENCOUNTER — Other Ambulatory Visit: Payer: Self-pay | Admitting: Family Medicine

## 2018-07-22 DIAGNOSIS — I509 Heart failure, unspecified: Secondary | ICD-10-CM | POA: Diagnosis not present

## 2018-07-22 DIAGNOSIS — E11630 Type 2 diabetes mellitus with periodontal disease: Secondary | ICD-10-CM | POA: Diagnosis not present

## 2018-07-22 DIAGNOSIS — E039 Hypothyroidism, unspecified: Secondary | ICD-10-CM | POA: Diagnosis not present

## 2018-07-22 DIAGNOSIS — E1142 Type 2 diabetes mellitus with diabetic polyneuropathy: Secondary | ICD-10-CM | POA: Diagnosis not present

## 2018-07-22 DIAGNOSIS — I110 Hypertensive heart disease with heart failure: Secondary | ICD-10-CM | POA: Diagnosis not present

## 2018-07-22 DIAGNOSIS — E785 Hyperlipidemia, unspecified: Secondary | ICD-10-CM | POA: Diagnosis not present

## 2018-07-22 DIAGNOSIS — E1151 Type 2 diabetes mellitus with diabetic peripheral angiopathy without gangrene: Secondary | ICD-10-CM | POA: Diagnosis not present

## 2018-07-22 DIAGNOSIS — Z794 Long term (current) use of insulin: Secondary | ICD-10-CM | POA: Diagnosis not present

## 2018-07-22 DIAGNOSIS — R69 Illness, unspecified: Secondary | ICD-10-CM | POA: Diagnosis not present

## 2018-07-23 ENCOUNTER — Other Ambulatory Visit: Payer: Self-pay | Admitting: Family Medicine

## 2018-08-02 ENCOUNTER — Ambulatory Visit: Payer: Medicare HMO | Admitting: Sports Medicine

## 2018-08-02 DIAGNOSIS — W19XXXA Unspecified fall, initial encounter: Secondary | ICD-10-CM | POA: Diagnosis not present

## 2018-08-02 DIAGNOSIS — R5381 Other malaise: Secondary | ICD-10-CM | POA: Diagnosis not present

## 2018-08-02 DIAGNOSIS — T1490XA Injury, unspecified, initial encounter: Secondary | ICD-10-CM | POA: Diagnosis not present

## 2018-08-03 ENCOUNTER — Telehealth: Payer: Self-pay | Admitting: Family Medicine

## 2018-08-03 NOTE — Telephone Encounter (Signed)
Pt is calling in states that he is falling again, and would like a aide to come in 3 times a week. Someone suggested that he call the Dr and ask

## 2018-08-04 ENCOUNTER — Telehealth: Payer: Self-pay | Admitting: *Deleted

## 2018-08-04 ENCOUNTER — Other Ambulatory Visit: Payer: Self-pay | Admitting: Family Medicine

## 2018-08-04 NOTE — Telephone Encounter (Signed)
Specifically, If he is taking BOTH fluoxetine 20 mg  AND CITALOPRAM 40 MG , HE NEEDS TO STOP THE FLUOXETINE hE NEEDS TO CONTINUE THE WELLBUTRIN 150 MG  PLS REVIEW THIS WITH HIM AND VERIFY CLEAR UNDERSTANDING. I RECOMMEND TAKING THE FLUOXETINE ONE EVERY OTHER DAY FOR THE NEXT WEEK THEN STOP IT ALTOGETHER, MUST  CONTINUE THE CITALOPRAM AND WELLBUTRIN

## 2018-08-04 NOTE — Telephone Encounter (Signed)
Pt called in said Dr. Moshe Cipro wrote out a nerve pill and he the pharmacy gave another. He just needed the name of the medication and the mg he is supposed to take. This can be sent on MyChart as he is looking for updates on there.

## 2018-08-04 NOTE — Telephone Encounter (Signed)
If he does not have medicaid he will have to pay out of p[ocket, he does need an aide if falling but medicare only will not cover, if interested in pursuing , then he can be provided with a list of centers to call, we will fill in needed paperwork when he hear from them  Also pls ask and verify with him the antidepressants he is taking,  Keep getting medssages that he is on duplicate therapy

## 2018-08-04 NOTE — Telephone Encounter (Signed)
Spoke with patient and he states that he is only on Medicare. He states that he would no be able to afford to pay out of pocket for home health. He states that he is going to call social services and see if he can get assistance in trying to  qualify for medicaid. He also said he would look at his medicine and send you a message on MyChart to let you know which antidepressant he is on.

## 2018-08-04 NOTE — Telephone Encounter (Signed)
Please advise 

## 2018-08-05 NOTE — Telephone Encounter (Signed)
Not really understanding this note, calling patient to relay Dr. Griffin Dakin message

## 2018-08-05 NOTE — Telephone Encounter (Signed)
Spoke with patient, and made sure he understands about which medicine he should be taking and how to slowly stop taking the fluoxetine. Patient verbally gave understanding

## 2018-08-13 ENCOUNTER — Other Ambulatory Visit: Payer: Self-pay | Admitting: Family Medicine

## 2018-08-13 ENCOUNTER — Other Ambulatory Visit: Payer: Self-pay | Admitting: Gastroenterology

## 2018-08-13 DIAGNOSIS — M545 Low back pain: Principal | ICD-10-CM

## 2018-08-13 DIAGNOSIS — G8929 Other chronic pain: Secondary | ICD-10-CM

## 2018-08-15 ENCOUNTER — Other Ambulatory Visit: Payer: Self-pay | Admitting: Family Medicine

## 2018-08-15 MED ORDER — TRAMADOL HCL 50 MG PO TABS: ORAL_TABLET | ORAL | 1 refills | Status: AC

## 2018-08-16 ENCOUNTER — Ambulatory Visit: Payer: Medicare HMO | Admitting: Sports Medicine

## 2018-08-16 ENCOUNTER — Encounter: Payer: Self-pay | Admitting: Sports Medicine

## 2018-08-16 DIAGNOSIS — M79675 Pain in left toe(s): Secondary | ICD-10-CM | POA: Diagnosis not present

## 2018-08-16 DIAGNOSIS — I739 Peripheral vascular disease, unspecified: Secondary | ICD-10-CM | POA: Diagnosis not present

## 2018-08-16 DIAGNOSIS — E1149 Type 2 diabetes mellitus with other diabetic neurological complication: Secondary | ICD-10-CM | POA: Diagnosis not present

## 2018-08-16 DIAGNOSIS — B351 Tinea unguium: Secondary | ICD-10-CM | POA: Diagnosis not present

## 2018-08-16 DIAGNOSIS — L97511 Non-pressure chronic ulcer of other part of right foot limited to breakdown of skin: Secondary | ICD-10-CM

## 2018-08-16 NOTE — Progress Notes (Signed)
Patient ID: Nicholas [name], male   DOB: September 22, 1955, 63 y.o.   MRN: [id number]  Subjective: Nicholas [name] is a 63 y.o. Diabetic male patient seen in office for follow up of wound at right 1st toe, states that he does not know how the wound is healing because of the thick callus but has been applying betadine to area when he can wrap it; admits to some bloody drainage sometimes but doing much better, Denies any other symptoms, nausea,vomiting, fever, chills, or other symptoms.   Patient is also here for nail care. Denies any other issues. FBS this AM was not recorded.    Patient Active Problem List   Diagnosis Date Noted  . Vitamin D deficiency 04/27/2018  . Bronchitis 04/04/2018  . Back pain of lumbar region with sciatica 02/22/2018  . Headache 10/22/2017  . Depression, major, single episode, moderate ([place]) 10/22/2017  . Type 2 diabetes mellitus with diabetic nephropathy, without long-term current use of insulin ([place]) 06/23/2017  . Neck pain on left side 04/10/2017  . Dental caries 04/10/2017  . Hypothyroidism 08/19/2016  . GERD (gastroesophageal reflux disease) 04/14/2016  . Charcot foot due to diabetes mellitus ([place]) 01/28/2016  . Benign essential tremor 01/28/2016  . Skin lesion of back 10/07/2015  . Mixed hyperlipidemia 04/10/2015  . Benign hypertension 04/10/2015  . Abdominal wall bulge 01/15/2015  . Hx of adenomatous colonic polyps 02/21/2014  . Allergic rhinitis 07/24/2013  . Onychomycosis 05/21/2013  . Type 2 diabetes mellitus with pressure callus ([place]) 05/21/2013  . Back pain with radiation 04/20/2011  . Seborrheic dermatitis 11/20/2010  . Dermatomycosis 11/20/2010  . FATIGUE 04/01/2010  . CARPAL TUNNEL SYNDROME, BILATERAL 03/24/2010  . Hypogonadism in male 01/27/2010  . DEPRESSION/ANXIETY 01/27/2010  . CHF 01/13/2010  . DYSPNEA 01/13/2010  . Diabetes mellitus type 2 in obese ([place]) 12/12/2009  . MORBID OBESITY 12/12/2009  . Essential hypertension 12/12/2009   . PVD (peripheral vascular disease) ([place]) 12/12/2009   Current Outpatient Medications on File Prior to Visit  Medication Sig Dispense Refill  . anastrozole (ARIMIDEX) 1 MG tablet Take 1 mg by mouth daily.    Marland Kitchen aspirin EC 81 MG tablet Take 81 mg by mouth every evening.     . benzonatate (TESSALON) 100 MG capsule Take 1 capsule (100 mg total) by mouth 2 (two) times daily as needed for cough. 14 capsule 0  . brimonidine-timolol (COMBIGAN) 0.2-0.5 % ophthalmic solution Place 1 drop into both eyes 2 (two) times daily.     Marland Kitchen buPROPion (WELLBUTRIN XL) 150 MG 24 hr tablet TAKE 1 TABLET BY MOUTH ONCE A DAY. 90 tablet 0  . citalopram (CELEXA) 40 MG tablet TAKE 1 TABLET BY MOUTH ONCE A DAY. 90 tablet 0  . Continuous Blood Gluc Sensor (FREESTYLE LIBRE 14 DAY SENSOR) MISC Inject 1 each into the skin every 14 (fourteen) days. Use as directed. 2 each 2  . cyclobenzaprine (FLEXERIL) 10 MG tablet TAKE (1) TABLET BY MOUTH EVERY EIGHT HOURS AS NEEDED FOR MUSCLE SPASMS. 30 tablet 0  . cyclobenzaprine (FLEXERIL) 10 MG tablet TAKE (1) TABLET BY MOUTH EVERY EIGHT HOURS AS NEEDED FOR MUSCLE SPASMS. 30 tablet 0  . cyclobenzaprine (FLEXERIL) 10 MG tablet TAKE (1) TABLET BY MOUTH EVERY EIGHT HOURS AS NEEDED FOR MUSCLE SPASMS. 30 tablet 3  . gabapentin (NEURONTIN) 600 MG tablet TAKE ONE TABLET BY MOUTH 3 TIMES DAILY. 270 tablet 2  . glucose blood test strip 1 each by Other route 4 (four) times daily. Use as instructed  4 x daily. Verio. E11.65 150 each 5  . HUMULIN R U-500 KWIKPEN 500 UNIT/ML kwikpen INJECT UP TO 100 UNITS SUBCUTANEOUSLY 30 MINUTES BEFORE MEALS. (Patient taking differently: Inject 80 Units into the skin 3 (three) times daily with meals. ) 18 mL 2  . hydrochlorothiazide (HYDRODIURIL) 25 MG tablet TAKE 1 CAPSULE BY MOUTH DAILY. 30 tablet 5  . ibuprofen (ADVIL,MOTRIN) 800 MG tablet Take 1 tablet (800 mg total) by mouth 3 (three) times daily. 30 tablet 0  . Insulin Pen Needle (SURE COMFORT PEN NEEDLES) 31G X 8  MM MISC Inject 1 each into the skin 4 (four) times daily. 200 each 2  . ketoconazole (NIZORAL) 2 % shampoo Apply 1 application topically 2 (two) times a week. 120 mL 3  . lamoTRIgine (LAMICTAL) 200 MG tablet TAKE 1 TABLET BY MOUTH ONCE A DAY. 30 tablet 0  . levothyroxine (SYNTHROID, LEVOTHROID) 125 MCG tablet Take 1 tablet (125 mcg total) by mouth daily before breakfast. 30 tablet 6  . lisinopril (PRINIVIL,ZESTRIL) 5 MG tablet Take 1 tablet (5 mg total) by mouth daily. 90 tablet 3  . metoprolol succinate (TOPROL-XL) 25 MG 24 hr tablet TAKE (1) TABLET BY MOUTH ONCE DAILY. 90 tablet 3  . naproxen (NAPROSYN) 500 MG tablet TAKE ONE TABLET BY MOUTH TWICE DAILY WITH A MEAL. 60 tablet 0  . NOVOFINE 30G X 8 MM MISC     . pantoprazole (PROTONIX) 40 MG tablet TAKE (1) TABLET BY MOUTH TWICE DAILY. 60 tablet 5  . penicillin v potassium (VEETID) 500 MG tablet Take 1 tablet (500 mg total) by mouth 3 (three) times daily. 30 tablet 0  . ranitidine (ZANTAC) 150 MG capsule Take 1 capsule (150 mg total) by mouth 2 (two) times daily. 30 capsule 0  . simvastatin (ZOCOR) 40 MG tablet TAKE 1 TABLET BY MOUTH ONCE A DAY. 90 tablet 0  . testosterone cypionate (DEPOTESTOSTERONE CYPIONATE) 200 MG/ML injection     . traMADol (ULTRAM) 50 MG tablet TAKE 2 TABLETS BY MOUTH EVERY 8 HOURS AS NEEDED. 180 tablet 0  . traMADol (ULTRAM) 50 MG tablet Take two tablets by mouth every 8 hours, as needed, for pain 180 tablet 1  . TRAVATAN Z 0.004 % SOLN ophthalmic solution Place 1 drop into both eyes at bedtime.     . triamcinolone ointment (KENALOG) 0.1 % APPLY TO AFFECTED AREAS TWICE DAILY. (Patient taking differently: APPLY TO AFFECTED AREAS TWICE DAILY AS NEEDED FOR DRY/ITCHY SKIN.) 454 g 0  . VICTOZA 18 MG/3ML SOPN INJECT 1.8 MG SUBCUTANEOUSLY ONCE DAILY. 9 mL 2  . Vitamin D, Ergocalciferol, (DRISDOL) 1.25 MG (50000 UT) CAPS capsule TAKE ONE CAPSULE BY MOUTH ONCE WEEKLY. 12 capsule 0   Current Facility-Administered Medications on  File Prior to Visit  Medication Dose Route Frequency Provider Last Rate Last Dose  . triamcinolone acetonide (KENALOG-40) injection 20 mg  20 mg Other Once Landis Martins, DPM       Allergies  Allergen Reactions  . No Allergies On File   . No Known Allergies     No results found for this or any previous visit (from the past 2160 hour(s)).  Objective: There were no vitals filed for this visit.  General: Patient is awake, alert, oriented x 3 and in no acute distress.  Dermatology: Skin is warm and dry bilateral with a partial thickness ulceration measures 0.3x0.2x0.1cm with granular base at right hallux with surrounding reactive keratosis no signs of acute infection at site. Nails are elongated and  thickened consistent with mycosis on left. Abrasion to left shin without infection.    Vascular: Dorsalis Pedis pulse = 0/4 Bilateral,  Posterior Tibial pulse = 1/4 Bilateral faint,  Capillary Fill Time < 5 seconds, + chronic venous skin changes bilateral.  Neurologic:Protective sensation diminished using the 5.07/10g Semmes Weinstein Monofilament. Vibratory diminished bilateral.   Musculosketal:  Chronic pain to plantar medial and dorsal midfoot on left with accompanying prominent bones supportive of Charcot. No Pain with palpation to ulcerated area at right hallux. + Hallux interphalangeus R>L hallux, No pain with compression to calves bilateral.   Assessment and Plan:  Problem List Items Addressed This Visit      Cardiovascular and Mediastinum   PVD (peripheral vascular disease) ([place])    Other Visit Diagnoses    Toe ulcer, right, limited to breakdown of skin ([place])    -  Primary   Type II diabetes mellitus with neurological manifestations ([place])       Dermatophytosis of nail       Toe pain, left         -Examined patient  -Mechanically debrided nails x 5 on left using sterile nail nipper without incident and debrided ulceration at right 1st toe using sterile chisel blade and dressed  with betadine. Advised patient to do the same until healed as previous -Continue with offloading pad to right insole as previous; Patient to see rick for adjustments of brace on left -Continue with gabapentin like before for neuropathy -Advised walker for gait -Patient to return to office in 10-12 weeks for nail trim and wound check on right or sooner if problems arise.  Landis Martins, DPM

## 2018-08-19 ENCOUNTER — Other Ambulatory Visit: Payer: Self-pay | Admitting: "Endocrinology

## 2018-08-19 ENCOUNTER — Other Ambulatory Visit: Payer: Self-pay | Admitting: Family Medicine

## 2018-08-23 DIAGNOSIS — E1169 Type 2 diabetes mellitus with other specified complication: Secondary | ICD-10-CM | POA: Diagnosis not present

## 2018-08-23 DIAGNOSIS — Z125 Encounter for screening for malignant neoplasm of prostate: Secondary | ICD-10-CM | POA: Diagnosis not present

## 2018-08-23 DIAGNOSIS — E559 Vitamin D deficiency, unspecified: Secondary | ICD-10-CM | POA: Diagnosis not present

## 2018-08-23 DIAGNOSIS — E039 Hypothyroidism, unspecified: Secondary | ICD-10-CM | POA: Diagnosis not present

## 2018-08-23 DIAGNOSIS — E1121 Type 2 diabetes mellitus with diabetic nephropathy: Secondary | ICD-10-CM | POA: Diagnosis not present

## 2018-08-23 DIAGNOSIS — E669 Obesity, unspecified: Secondary | ICD-10-CM | POA: Diagnosis not present

## 2018-08-24 ENCOUNTER — Other Ambulatory Visit: Payer: Self-pay | Admitting: Family Medicine

## 2018-08-24 LAB — COMPLETE METABOLIC PANEL WITH GFR
AG RATIO: 1.6 (calc) (ref 1.0–2.5)
ALKALINE PHOSPHATASE (APISO): 93 U/L (ref 35–144)
ALT: 10 U/L (ref 9–46)
AST: 16 U/L (ref 10–35)
Albumin: 4 g/dL (ref 3.6–5.1)
BUN/Creatinine Ratio: 17 (calc) (ref 6–22)
BUN: 29 mg/dL — ABNORMAL HIGH (ref 7–25)
CALCIUM: 9.5 mg/dL (ref 8.6–10.3)
CO2: 31 mmol/L (ref 20–32)
Chloride: 102 mmol/L (ref 98–110)
Creat: 1.69 mg/dL — ABNORMAL HIGH (ref 0.70–1.25)
GFR, Est African American: 49 mL/min/{1.73_m2} — ABNORMAL LOW (ref >=60)
GFR, Est Non African American: 42 mL/min/{1.73_m2} — ABNORMAL LOW (ref >=60)
GLOBULIN: 2.5 g/dL (ref 1.9–3.7)
Glucose, Bld: 141 mg/dL — ABNORMAL HIGH (ref 65–99)
POTASSIUM: 4.9 mmol/L (ref 3.5–5.3)
SODIUM: 143 mmol/L (ref 135–146)
Total Bilirubin: 0.6 mg/dL (ref 0.2–1.2)
Total Protein: 6.5 g/dL (ref 6.1–8.1)

## 2018-08-24 LAB — LIPID PANEL
CHOL/HDL RATIO: 3.6 (calc) (ref <5.0)
CHOLESTEROL: 122 mg/dL (ref <200)
HDL: 34 mg/dL — AB (ref >=40)
LDL CHOLESTEROL (CALC): 70 mg/dL
NON-HDL CHOLESTEROL (CALC): 88 mg/dL (ref <130)
Triglycerides: 97 mg/dL (ref <150)

## 2018-08-24 LAB — TSH: TSH: 2.85 m[IU]/L (ref 0.40–4.50)

## 2018-08-24 LAB — VITAMIN D 25 HYDROXY (VIT D DEFICIENCY, FRACTURES): Vit D, 25-Hydroxy: 56 ng/mL (ref 30–100)

## 2018-08-24 LAB — T4, FREE: Free T4: 1 ng/dL (ref 0.8–1.8)

## 2018-08-24 LAB — HEMOGLOBIN A1C
EAG (MMOL/L): 7.6 (calc)
Hgb A1c MFr Bld: 6.4 % of total Hgb — ABNORMAL HIGH (ref <5.7)
MEAN PLASMA GLUCOSE: 137 (calc)

## 2018-08-24 LAB — PSA: PSA: 0.5 ng/mL (ref <=4.0)

## 2018-08-29 ENCOUNTER — Ambulatory Visit: Payer: Medicare HMO | Admitting: "Endocrinology

## 2018-09-01 ENCOUNTER — Other Ambulatory Visit: Payer: Self-pay | Admitting: "Endocrinology

## 2018-09-01 DIAGNOSIS — R69 Illness, unspecified: Secondary | ICD-10-CM | POA: Diagnosis not present

## 2018-09-07 ENCOUNTER — Telehealth: Payer: Self-pay | Admitting: *Deleted

## 2018-09-07 NOTE — Telephone Encounter (Signed)
Pt LVM stating he had fell and skinned his leg pretty bad was requesting wound care to come out to his home. Called back to offer him appt pt said he would not be able to come into the office as he cannot move around that well. Said he had wound care come out to the house before and just wanted to see if Dr. Moshe Cipro could send them back out as he did not want it infected.

## 2018-09-07 NOTE — Telephone Encounter (Signed)
States he fell 1.5 weeks ago and scraped up his left shin area and he was trying to care for it himself but its not healing like it should and its draining yellow pus. Advised he needed an appt but he said he could not come in here due to his mobility issues and wanted to see if home health could come out and evaluate it and see him until its healed. Please advise

## 2018-09-07 NOTE — Telephone Encounter (Signed)
If home health can be justified without an OV fine, BUT if not, needs ov, Also since draining pus , sounds as though better he have OV

## 2018-09-12 ENCOUNTER — Telehealth: Payer: Self-pay | Admitting: Family Medicine

## 2018-09-12 ENCOUNTER — Other Ambulatory Visit: Payer: Medicare HMO | Admitting: Orthotics

## 2018-09-12 NOTE — Telephone Encounter (Signed)
Pt LVM that he needs assistance with his legs

## 2018-09-12 NOTE — Telephone Encounter (Signed)
Spoke with patient and let him know I've sent a message to home health asking if they would need the last face to face ov notes before seeing the patient for wound care. Patient states he is unable to walk and come into the office to be seen to have the issue addressed. Patient is upset and I explained to him we have to go by the rules and once I hear something I would let him know. He apologized and stated he understood.

## 2018-09-13 ENCOUNTER — Ambulatory Visit (INDEPENDENT_AMBULATORY_CARE_PROVIDER_SITE_OTHER): Payer: Medicare HMO | Admitting: Family Medicine

## 2018-09-13 ENCOUNTER — Ambulatory Visit (HOSPITAL_COMMUNITY)
Admission: RE | Admit: 2018-09-13 | Discharge: 2018-09-13 | Disposition: A | Discharge status: Home / Self Care | Payer: Medicare HMO | Source: Ambulatory Visit | Attending: Family Medicine | Admitting: Family Medicine

## 2018-09-13 ENCOUNTER — Other Ambulatory Visit: Payer: Self-pay

## 2018-09-13 ENCOUNTER — Telehealth: Payer: Self-pay

## 2018-09-13 VITALS — BP 136/78 | HR 107 | Temp 98.4°F | Resp 18 | Ht 72.0 in | Wt >= 6400 oz

## 2018-09-13 DIAGNOSIS — I739 Peripheral vascular disease, unspecified: Secondary | ICD-10-CM

## 2018-09-13 DIAGNOSIS — L97923 Non-pressure chronic ulcer of unspecified part of left lower leg with necrosis of muscle: Secondary | ICD-10-CM | POA: Diagnosis not present

## 2018-09-13 DIAGNOSIS — E1169 Type 2 diabetes mellitus with other specified complication: Secondary | ICD-10-CM

## 2018-09-13 DIAGNOSIS — S81802A Unspecified open wound, left lower leg, initial encounter: Secondary | ICD-10-CM

## 2018-09-13 DIAGNOSIS — F321 Major depressive disorder, single episode, moderate: Secondary | ICD-10-CM

## 2018-09-13 DIAGNOSIS — E1151 Type 2 diabetes mellitus with diabetic peripheral angiopathy without gangrene: Secondary | ICD-10-CM | POA: Diagnosis not present

## 2018-09-13 DIAGNOSIS — E669 Obesity, unspecified: Secondary | ICD-10-CM

## 2018-09-13 DIAGNOSIS — Z594 Lack of adequate food and safe drinking water: Secondary | ICD-10-CM

## 2018-09-13 DIAGNOSIS — Z5941 Food insecurity: Secondary | ICD-10-CM

## 2018-09-13 DIAGNOSIS — R69 Illness, unspecified: Secondary | ICD-10-CM | POA: Diagnosis not present

## 2018-09-13 DIAGNOSIS — M19072 Primary osteoarthritis, left ankle and foot: Secondary | ICD-10-CM | POA: Diagnosis not present

## 2018-09-13 MED ORDER — CLINDAMYCIN HCL 300 MG PO CAPS: 300.0000 mg | ORAL_CAPSULE | Freq: Three times a day (TID) | ORAL | 0 refills | Status: DC

## 2018-09-13 NOTE — Assessment & Plan Note (Signed)
Necrosis of underlying muscle, 8' x 5 ", refer to wound center, needs X ray of leg and also cleocin p[rescribed, in home wound care

## 2018-09-13 NOTE — Telephone Encounter (Signed)
Home health referral placed

## 2018-09-13 NOTE — Patient Instructions (Signed)
Please cancel 3/24 office visit  I recommend X ray of left leg today to ensure bone is not infected, please go to the hospital after you leave , X ray dept Cleocin antibiotic id prescribed for 10 days   Reschedule f/u with PCP in 3.5 months  You need to be followed for this ulcer by a wound Doc, and you also will be referred to [facility] surgery to evaluate blood flow.  You are being referred for in home home health for 3 times weekly dressing .  You are being referred to social worker in [facility] for help with food

## 2018-09-13 NOTE — Progress Notes (Signed)
Nicholas [name]     MRN: [id number]      DOB: February 25, 1956   HPI Nicholas [name] is here with chronic bilateral leg ulcers, non healing , left leg lateral aspect 8in x 3 in, right leg lateral is 1 " long. C/o scant purulent drainage from left ulcer Bilateral rubor of both feet, bi pedal swelling blistering and eschar on 4 right toes and 3 left toes. He has controlled diabetes as well as severe morbid obesity  With marked limitation in mobility. C/o food insecurity, no food in the house except for meals on wheels   ROS Denies recent fever or chills. Denies sinus pressure, nasal congestion, ear pain or sore throat. Denies chest congestion, productive cough or wheezing. Denies chest pains, palpitations and leg swelling Denies abdominal pain, nausea, vomiting,diarrhea or constipation.   Denies dysuria, frequency, hesitancy or incontinence.  Denies uncontrolled  depression, anxiety or insomnia.  PE  BP 136/78   Pulse (!) 107   Temp 98.4 F (36.9 C) (Oral)   Resp 18   Ht 6' (1.829 m)   Wt (!) 437 lb (198.2 kg)   SpO2 93% Comment: room air  BMI 59.27 kg/m   Patient alert and oriented and in no cardiopulmonary distress.  HEENT: No facial asymmetry, EOMI,   oropharynx pink and moist.  Neck supple no JVD, no mass.  Chest: Clear to auscultation bilaterally.  CVS: S1, S2 no murmurs, no S3.Regular rate.  ABD: Soft non tender.   Ext: No edema  MS: Decreased ROM  spine, shoulders, hips and knees.  Skin: ulceration of both lower extremities as described  Psych: Good eye contact, normal affect. Memory intact mildly  anxious and  depressed appearing.  CNS: CN 2-12 intact, power,  normal tdecreased sensation in lower extremities.   Assessment & Plan  Leg ulcer, left (HCC) Necrosis of underlying muscle, 8' x 5 ", refer to wound center, needs X ray of leg and also cleocin p[rescribed, in home wound care  Depression, major, single episode, moderate (HCC) Controlled , no change  in medication  MORBID OBESITY Deteriorated.  Patient re-educated about  the importance of commitment to a  minimum of 150 minutes of exercise per week as able.  The importance of healthy food choices with portion control discussed, as well as eating regularly and within a 12 hour window most days. The need to choose "clean , green" food 50 to 75% of the time is discussed, as well as to make water the primary drink and set a goal of 64 ounces water daily.  Encouraged to start a food diary,  and to consider  joining a support group. Sample diet sheets offered. Goals set by the patient for the next several months.   Weight /BMI 09/13/2018 07/15/2018 04/27/2018  WEIGHT 437 lb 441 lb 423 lb  HEIGHT 6\' 0"  6\' 0"  6\' 0"   BMI 59.27 kg/m2 59.81 kg/m2 57.37 kg/m2      Type 2 diabetes mellitus with peripheral vascular disease ([place]) Managed by Endo and well controlled Nicholas [name] is reminded of the importance of commitment to daily physical activity for 30 minutes or more, as able and the need to limit carbohydrate intake to 30 to 60 grams per meal to help with blood sugar control.   The need to take medication as prescribed, test blood sugar as directed, and to call between visits if there is a concern that blood sugar is uncontrolled is also discussed.   Nicholas [name] is reminded of  the importance of daily foot exam, annual eye examination, and good blood sugar, blood pressure and cholesterol control.  Diabetic Labs Latest Ref Rng & Units 08/23/2018 04/21/2018 03/28/2018 01/10/2018 09/14/2017  HbA1c <5.7 % of total Hgb 6.4(H) 7.4(H) - 6.9(H) 7.6(H)  Microalbumin <2.0 mg/dL - - - - -  Micro/Creat Ratio 0.0 - 30.0 mg/g - - - - -  Chol <200 mg/dL 122 - - - -  HDL > OR = 40 mg/dL 34(L) - - - -  Calc LDL mg/dL (calc) 70 - - - -  Triglycerides <150 mg/dL 97 - - - -  Creatinine 0.70 - 1.25 mg/dL 1.69(H) 1.38(H) 1.26(H) 1.19 1.14   BP/Weight 09/13/2018 07/15/2018 05/03/2018 04/27/2018 04/21/2018  03/30/2018 5/46/5035  Systolic BP 465 681 275 170 017 494 496  Diastolic BP 78 91 70 72 70 90 69  Wt. (Lbs) 437 441 - 423 423 422.04 425  BMI 59.27 59.81 - 57.37 57.37 57.24 57.64   Foot/eye exam completion dates Latest Ref Rng & Units 10/12/2017 05/29/2016  Eye Exam No Retinopathy No Retinopathy No Retinopathy  Foot exam Order - - -  Foot Form Completion - - -        PAD (peripheral artery disease) (HCC)  Poor circulation in lower extremities with evidence of necrotic ulceration of toes with eschar/ and rubor of skin with bipeal swelling , needs eval by vascular surgery asap  Food insecurity Reports no food , except for meals on wheels, refer to [facility]

## 2018-09-14 ENCOUNTER — Encounter (INDEPENDENT_AMBULATORY_CARE_PROVIDER_SITE_OTHER): Payer: Self-pay | Admitting: Orthopedic Surgery

## 2018-09-14 ENCOUNTER — Ambulatory Visit (INDEPENDENT_AMBULATORY_CARE_PROVIDER_SITE_OTHER): Payer: Medicare HMO | Admitting: Orthopedic Surgery

## 2018-09-14 ENCOUNTER — Other Ambulatory Visit: Payer: Self-pay | Admitting: Family Medicine

## 2018-09-14 ENCOUNTER — Encounter: Payer: Self-pay | Admitting: Family Medicine

## 2018-09-14 ENCOUNTER — Other Ambulatory Visit: Payer: Self-pay | Admitting: *Deleted

## 2018-09-14 VITALS — Ht 72.0 in | Wt >= 6400 oz

## 2018-09-14 DIAGNOSIS — I872 Venous insufficiency (chronic) (peripheral): Secondary | ICD-10-CM | POA: Diagnosis not present

## 2018-09-14 DIAGNOSIS — I739 Peripheral vascular disease, unspecified: Secondary | ICD-10-CM

## 2018-09-14 DIAGNOSIS — L97921 Non-pressure chronic ulcer of unspecified part of left lower leg limited to breakdown of skin: Secondary | ICD-10-CM | POA: Diagnosis not present

## 2018-09-14 DIAGNOSIS — E1151 Type 2 diabetes mellitus with diabetic peripheral angiopathy without gangrene: Secondary | ICD-10-CM

## 2018-09-14 DIAGNOSIS — Z594 Lack of adequate food and safe drinking water: Secondary | ICD-10-CM | POA: Insufficient documentation

## 2018-09-14 DIAGNOSIS — Z5941 Food insecurity: Secondary | ICD-10-CM | POA: Insufficient documentation

## 2018-09-14 NOTE — Assessment & Plan Note (Signed)
Controlled, no change in medication  

## 2018-09-14 NOTE — Assessment & Plan Note (Signed)
Reports no food , except for meals on wheels, refer to [facility]

## 2018-09-14 NOTE — Assessment & Plan Note (Signed)
Deteriorated.  Patient re-educated about  the importance of commitment to a  minimum of 150 minutes of exercise per week as able.  The importance of healthy food choices with portion control discussed, as well as eating regularly and within a 12 hour window most days. The need to choose "clean , green" food 50 to 75% of the time is discussed, as well as to make water the primary drink and set a goal of 64 ounces water daily.  Encouraged to start a food diary,  and to consider  joining a support group. Sample diet sheets offered. Goals set by the patient for the next several months.   Weight /BMI 09/13/2018 07/15/2018 04/27/2018  WEIGHT 437 lb 441 lb 423 lb  HEIGHT 6\' 0"  6\' 0"  6\' 0"   BMI 59.27 kg/m2 59.81 kg/m2 57.37 kg/m2

## 2018-09-14 NOTE — Assessment & Plan Note (Signed)
Managed by Endo and well controlled Nicholas [name] is reminded of the importance of commitment to daily physical activity for 30 minutes or more, as able and the need to limit carbohydrate intake to 30 to 60 grams per meal to help with blood sugar control.   The need to take medication as prescribed, test blood sugar as directed, and to call between visits if there is a concern that blood sugar is uncontrolled is also discussed.   Nicholas [name] is reminded of the importance of daily foot exam, annual eye examination, and good blood sugar, blood pressure and cholesterol control.  Diabetic Labs Latest Ref Rng & Units 08/23/2018 04/21/2018 03/28/2018 01/10/2018 09/14/2017  HbA1c <5.7 % of total Hgb 6.4(H) 7.4(H) - 6.9(H) 7.6(H)  Microalbumin <2.0 mg/dL - - - - -  Micro/Creat Ratio 0.0 - 30.0 mg/g - - - - -  Chol <200 mg/dL 122 - - - -  HDL > OR = 40 mg/dL 34(L) - - - -  Calc LDL mg/dL (calc) 70 - - - -  Triglycerides <150 mg/dL 97 - - - -  Creatinine 0.70 - 1.25 mg/dL 1.69(H) 1.38(H) 1.26(H) 1.19 1.14   BP/Weight 09/13/2018 07/15/2018 05/03/2018 04/27/2018 04/21/2018 03/30/2018 07/31/5425  Systolic BP 062 376 283 151 761 607 371  Diastolic BP 78 91 70 72 70 90 69  Wt. (Lbs) 437 441 - 423 423 422.04 425  BMI 59.27 59.81 - 57.37 57.37 57.24 57.64   Foot/eye exam completion dates Latest Ref Rng & Units 10/12/2017 05/29/2016  Eye Exam No Retinopathy No Retinopathy No Retinopathy  Foot exam Order - - -  Foot Form Completion - - -

## 2018-09-14 NOTE — Patient Outreach (Signed)
[place] [name]) Care Management  09/14/2018  Nicholas [name] 1955-07-29 [id number]   Subjective: Telephone call to patient's home number, spoke with patient, and HIPAA verified.  Discussed [place] MD Referral follow up, patient voiced understanding, and is in agreement to follow up.  Patient states he is doing fair, is getting ready to go to an MD appointment, and requested call back.     Objective: Per KPN (Knowledge Performance Now, point of care tool) and chart review, patient has had no recent hospitalizations.  Patient also has a history of diabetes, hypertension, Hyperlipidemia, MRSA (methicillin resistant Staphylococcus aureus), Neuropathy, Suicide attempt , Charcot foot due to diabetes mellitus, and PVD (peripheral vascular disease.     Assessment: Nicholas Gula MD Referral on 09/13/2018.  Referral source: Dr. Tula [name].  Referral reason: Social Worker consult, decline mobility, and no food in the house.   Screening  follow up pending patient contact.      Plan: RNCM will send unsuccessful outreach  letter, [facility] pamphlet, will call patient for 2nd telephone outreach attempt within 4 business days, screening follow up, and will proceed with case closure within 10 business days if no return call.     Nicholas [name] H. Nicholas [name], BSN, Nicholas [name] Management Nicholas [name] Telephonic CM Phone: [phone] Fax: [phone]

## 2018-09-14 NOTE — Assessment & Plan Note (Signed)
Poor circulation in lower extremities with evidence of necrotic ulceration of toes with eschar/ and rubor of skin with bipeal swelling , needs eval by vascular surgery asap

## 2018-09-15 ENCOUNTER — Other Ambulatory Visit: Payer: Self-pay | Admitting: *Deleted

## 2018-09-15 NOTE — Patient Outreach (Signed)
[place] [facility]) Care Management  09/15/2018  Nicholas [name] 10-07-1955 [id number]   Subjective: Telephone call to patient's home number, spoke with patient, and HIPAA verified.  Discussed [place] MD Referral  follow up, patient voiced understanding, and is in agreement to follow up.  Patient states he is doing alright, currently receiving Meals on Wheels ( 1 meal for 5 days per week), sister is paying for the meals at this times, started receiving meals last week, is in agreement to a referral to [place] Social Worker for additional meals and for mail delivery community resources. States this is the only food that he currently has access too.   States he is also needing a Data processing manager for mail delivery to his door since he has a hard time going to the mail box, due to it causing increase shortness of breath.   Discussed breathing and energy conservation strategies, patient voiced understanding, and is in agreement.   States he his breathing is at his baseline.   Discussed home safety / fall risk assessment, states he was falling due medications, had last fall 3 weeks ago, has had no falls since medication adjustment, and does not need any fall risk interventions at this time.   Patient states he is aware of signs / symptoms to report to MD and how to reach MD after hours if needed.   Patient states he does not have any education material, transition of care, care coordination, disease management, disease monitoring, transportation, or pharmacy needs at this time.  States he is very appreciative of the follow up and is in agreement to receive [place] Management information.      Objective: Per KPN (Knowledge Performance Now, point of care tool) and chart review, patient has had no recent hospitalizations.  Patient also has a history of diabetes, hypertension, Hyperlipidemia, MRSA (methicillin resistant Staphylococcus aureus), Neuropathy, Suicide  attempt , Charcot foot due to diabetes mellitus, and PVD (peripheral vascular disease.     Assessment: [name] MD Referral on 09/13/2018.  Referral source: Dr. Tula Nakayama. Referral reason: Social Worker consult, decline mobility, and no food in the house.   Screening follow up completed and will refer to [place] Worker for meal assistance, and community resources for door mail delivery.         Plan: RNCM will refer to [place] Management Social Worker for meal assistance, and community resources for door mail delivery.      Jaydi Bray H. Annia Friendly, BSN, [place] Management [facility] Telephonic CM Phone: [phone] Fax: [phone]

## 2018-09-19 ENCOUNTER — Encounter (INDEPENDENT_AMBULATORY_CARE_PROVIDER_SITE_OTHER): Payer: Self-pay | Admitting: Orthopedic Surgery

## 2018-09-19 ENCOUNTER — Ambulatory Visit (INDEPENDENT_AMBULATORY_CARE_PROVIDER_SITE_OTHER): Payer: Medicare HMO | Admitting: Orthopedic Surgery

## 2018-09-19 ENCOUNTER — Telehealth: Payer: Self-pay | Admitting: *Deleted

## 2018-09-19 NOTE — Patient Outreach (Signed)
[place] [facility]) Care Management  09/19/2018  Nicholas [name] [birth year] [id number]   CSW received referral from [place] for additional meals and formail delivery community resources. States this is the only food that he currently has access too. States he is also needing a Data processing manager for mail delivery to his door since he has a hard time going to the mail box, due to it causing increase shortness of breath. He is currently receiving Meals on Wheels (1 meal for 5 days per week), sister is paying for the meals at this times, started receiving meals last week. CSW called & spoke with patient who confirmed that his sister is currently paying for a month of Meals on Wheels and was unsure whether she had put him on the waiting list for MOW but plans to check with his sister to make sure. Patient also was agreeable to having community resources mailed to him as well - food pantry list & brochure to [facility]. CSW also explained process to having mail delivered to his home as opposed to mailbox which involves having a letter written to the [place] but since patient is a resident of [place] apartments, [place] encouraged him to discuss issue with the manager there to see if anything could be done. Patient states that his sister had suggested him getting some help in the home as well but states that he was unsure where to start with that. CSW discussed Aging, [place] [facility] where they only charge $10/hour with minimum of 2 hours/visit, patient seemed open to that - CSW will have brochure mailed to him as well. CSW will follow-up with patient in 2 weeks to ensure that he has received information & encouraged patient to call should any other needs arise before then.   Raynaldo Opitz, LCSW [facility]  Clinical Social Worker cell #: [phone]

## 2018-09-19 NOTE — Progress Notes (Signed)
Office Visit Note   Patient: Nicholas [name]           Date of Birth: 04-Jan-1956           MRN: [id number] Visit Date: 09/14/2018              Requested by: Fayrene Helper, MD [address] [place], [place] [address] PCP: Fayrene Helper, MD  Chief Complaint  Patient presents with  . Left Leg - Pain  . Right Leg - Pain      HPI: The patient is a 63 year old gentleman seen today for initial evaluation of bilateral lower extremity edema and ulceration.  The patient has been seen by his primary care who was alarmed for massive size and involvement of ulcerations.  Patient today is in a wheelchair for ambulation.  He has massive ulcers to his lower extremities left much worse than right.  Also has some ulcers to the dorsal aspect of many toes.  Currently has not been doing any wound care whatsoever.  Has no history of fevers chills purulence or foul odor.  He has followed with vascular in the past and has had angioplasty.  Currently taking Cleocin as prescribed by Dr. Moshe Cipro yesterday.  Assessment & Plan: Visit Diagnoses:  1. PVD (peripheral vascular disease) ([place])   2. Type 2 diabetes mellitus with peripheral vascular disease (HCC)   3. Venous insufficiency (chronic) (peripheral)   4. Ulcer of left lower extremity, limited to breakdown of skin ([place])     Plan: We will apply compressive wraps, in [place] boots today bilaterally.  Silvadene dressings to his toes.  We will request home health assistance with dressing changes twice weekly.  He will follow-up in the office next Monday.  Follow-Up Instructions: Return in about 5 days (around 09/19/2018).   Ortho Exam  Patient is alert, oriented, no adenopathy, well-dressed, normal affect, normal respiratory effort. On examination of lower extremities there is massive ulceration and pitting edema there is serous drainage.  Rubor to lower extremities and feet.  The left anterior shin with an 8 inch x 5 inch ulceration  this is 90% fibrinous tissue there is scant granulation to wound edges.  There is no surrounding maceration there is scant serous drainage today.  The right shin with ulceration as well this is significantly smaller also with about 80% fibrinous tissue about 10% granulation.  There is no purulence no odor no surrounding erythema no concerning signs for cellulitis. Ulceration to the 4 lesser toes on the right and toes 2 through 4 on the left.  These are dry covered with eschar.  No surrounding erythema no sign of cellulitis.  Does have a 2+ dorsalis pedis pulse bilaterally. Imaging: No results found. No images are attached to the encounter.  Labs: Lab Results  Component Value Date   HGBA1C 6.4 (H) 08/23/2018   HGBA1C 7.4 (H) 04/21/2018   HGBA1C 6.9 (H) 01/10/2018   ESRSEDRATE 25 (H) 12/11/2008   REPTSTATUS 12/12/2008 FINAL 12/10/2008   GRAMSTAIN No WBC Seen 08/30/2013   GRAMSTAIN Rare Squamous Epithelial Cells Present 08/30/2013   GRAMSTAIN Few Gram Negative Coccobacilli 08/30/2013   GRAMSTAIN Rare Gram Positive Cocci In Pairs 08/30/2013   GRAMSTAIN Rare Gram Positive Rods 08/30/2013   CULT  12/10/2008    MODERATE METHICILLIN RESISTANT STAPHYLOCOCCUS AUREUS Note: RIFAMPIN AND GENTAMICIN SHOULD NOT BE USED AS SINGLE DRUGS FOR TREATMENT OF STAPH INFECTIONS. This organism DOES NOT demonstrate inducible Clindamycin resistance in vitro. CRITICAL RESULT CALLED  TO, READ BACK BY AND VERIFIED WITH: TAMMY BLACKWELL  12/12/08 0840 BY SMITHERSJ   LABORGA Normal Oropharyngeal Flora 08/30/2013     Lab Results  Component Value Date   ALBUMIN 3.5 03/28/2018   ALBUMIN 3.9 02/22/2017   ALBUMIN 3.9 11/11/2016    Body mass index is 59.27 kg/m.  Orders:  No orders of the defined types were placed in this encounter.  No orders of the defined types were placed in this encounter.    Procedures: No procedures performed  Clinical Data: No additional findings.  ROS:  All other systems  negative, except as noted in the HPI. Review of Systems  Constitutional: Negative for chills and fever.  Skin: Positive for color change and wound. Negative for rash.    Objective: Vital Signs: Ht 6' (1.829 m)   Wt (!) 437 lb (198.2 kg)   BMI 59.27 kg/m   Specialty Comments:  No specialty comments available.  PMFS History: Patient Active Problem List   Diagnosis Date Noted  . Type 2 diabetes mellitus with peripheral vascular disease ([place]) 09/14/2018  . PAD (peripheral artery disease) ([place]) 09/14/2018  . Food insecurity 09/14/2018  . Vitamin D deficiency 04/27/2018  . Back pain of lumbar region with sciatica 02/22/2018  . Headache 10/22/2017  . Depression, major, single episode, moderate ([place]) 10/22/2017  . Neck pain on left side 04/10/2017  . Dental caries 04/10/2017  . Hypothyroidism 08/19/2016  . GERD (gastroesophageal reflux disease) 04/14/2016  . Charcot foot due to diabetes mellitus ([place]) 01/28/2016  . Benign essential tremor 01/28/2016  . Skin lesion of back 10/07/2015  . Mixed hyperlipidemia 04/10/2015  . Benign hypertension 04/10/2015  . Abdominal wall bulge 01/15/2015  . Hx of adenomatous colonic polyps 02/21/2014  . Allergic rhinitis 07/24/2013  . Onychomycosis 05/21/2013  . Type 2 diabetes mellitus with pressure callus ([place]) 05/21/2013  . Leg ulcer, left ([place]) 11/24/2011  . Back pain with radiation 04/20/2011  . Seborrheic dermatitis 11/20/2010  . Dermatomycosis 11/20/2010  . FATIGUE 04/01/2010  . CARPAL TUNNEL SYNDROME, BILATERAL 03/24/2010  . Hypogonadism in male 01/27/2010  . DEPRESSION/ANXIETY 01/27/2010  . CHF 01/13/2010  . DYSPNEA 01/13/2010  . Diabetes mellitus type 2 in obese ([place]) 12/12/2009  . MORBID OBESITY 12/12/2009  . Essential hypertension 12/12/2009  . PVD (peripheral vascular disease) ([place]) 12/12/2009   Past Medical History:  Diagnosis Date  . Chronic knee pain   . Depression   . Diabetes ([place])   . Glaucoma   . Hyperlipidemia    . Hypertension   . MRSA (methicillin resistant Staphylococcus aureus)    patient denies  . Neuropathy   . Poor circulation    leg   . Suicide attempt ([place])   . Testosterone deficiency     Family History  Problem Relation Age of Onset  . Diabetes Mother   . Leukemia Mother   . Cancer Father        colon, age greater than 48?  Marland Kitchen Heart disease Father   . Glaucoma Father   . Parkinsonism Father        brain hemorrhage  . Alzheimer's disease Father   . Glaucoma Sister   . Dementia Sister   . Colon cancer Neg Hx     Past Surgical History:  Procedure Laterality Date  . CHOLECYSTECTOMY N/A 02/25/2015   Procedure: LAPAROSCOPIC CHOLECYSTECTOMY;  Surgeon: Aviva Signs, MD;  Location: [facility] ORS;  Service: General;  Laterality: N/A;  . COLONOSCOPY  03/2008   DJM:EQASTM external hemorrhoidal tag,  otherwise normal rectum/Two diminutive rectosigmoid polyps s/p bx/ Polyp in the opposite the ileocecal valve s/p bx. tubular adenoma.  . COLONOSCOPY N/A 03/23/2014   ZOX:WRUEAVWU colon polyps throughout his rectum and colon. Sessile and pedunculated. The largest polyp was proximally 6 mm in dimensions in the retum at 3 cm with adjacent diminutive polyp.  There was also a polyps on ileocecal valve, ascending segmen descending and sigmoid segment. as the colon was tortuous & elongated requiring externa abd pressure and changing of the pts postion to reach cecum  . COLONOSCOPY WITH PROPOFOL N/A 07/29/2017   Procedure: COLONOSCOPY WITH PROPOFOL;  Surgeon: Daneil Dolin, MD;  Location: [facility] ENDO SUITE;  Service: Endoscopy;  Laterality: N/A;  12:30pm  . ESOPHAGOGASTRODUODENOSCOPY N/A 03/23/2014   RMR: 1. Patient had circumferential distal esophageal erosions within 5 mm of the GE junction.  No Barrett's esophagus. Tubular esophagus patent throughout its course. 2.  Diffuse gastric erosions. (1) 71mm area of healing ulceration in the antrum. No ulcer or infiltrating process observed. Patent pylorus. Normal-appearing  first and second portion of the duodenum .  . kidney stones     cysto with litholapexy  . POLYPECTOMY  07/29/2017   Procedure: POLYPECTOMY;  Surgeon: Daneil Dolin, MD;  Location: [facility] ENDO SUITE;  Service: Endoscopy;;  colon    Social History   Occupational History  . Occupation: DISABLED    Employer: UNEMPLOYED  Tobacco Use  . Smoking status: Former Smoker    Packs/day: 1.50    Years: 25.00    Pack years: 37.50    Types: Cigarettes    Last attempt to quit: 02/20/2002    Years since quitting: 16.5  . Smokeless tobacco: Former Systems developer    Types: Snuff    Quit date: 11/02/1980  . Tobacco comment: Quit x 15 years  Substance and Sexual Activity  . Alcohol use: No  . Drug use: No  . Sexual activity: Not Currently    Birth control/protection: None

## 2018-09-20 ENCOUNTER — Ambulatory Visit: Payer: Medicare HMO | Admitting: Family Medicine

## 2018-09-21 ENCOUNTER — Telehealth (INDEPENDENT_AMBULATORY_CARE_PROVIDER_SITE_OTHER): Payer: Self-pay | Admitting: Orthopedic Surgery

## 2018-09-21 ENCOUNTER — Ambulatory Visit (INDEPENDENT_AMBULATORY_CARE_PROVIDER_SITE_OTHER): Payer: Medicare HMO | Admitting: Orthopedic Surgery

## 2018-09-21 NOTE — Telephone Encounter (Signed)
I faxed demo information to Sonia Side with [facility] to see if they can pick up this pt for services. I called pt and he is not running a fever, no cough but does c/o diarrhea and states that this is due to ABX that he has been on. I advised that I am trying to see if there is a HHA that will go out and remove the dressings and then reapply one a week and will call once I am able to find out.

## 2018-09-21 NOTE — Telephone Encounter (Signed)
Patient is unable to make his appointment today because of not feeling well.  He is wanting to know what he is suppose to do with the dressings because he does not know when he will be able to make it down for an appointment.  CB#[phone].  Thank you.

## 2018-09-22 ENCOUNTER — Telehealth: Payer: Self-pay | Admitting: *Deleted

## 2018-09-22 ENCOUNTER — Telehealth (INDEPENDENT_AMBULATORY_CARE_PROVIDER_SITE_OTHER): Payer: Self-pay | Admitting: Orthopedic Surgery

## 2018-09-22 ENCOUNTER — Other Ambulatory Visit (INDEPENDENT_AMBULATORY_CARE_PROVIDER_SITE_OTHER): Payer: Self-pay

## 2018-09-22 ENCOUNTER — Other Ambulatory Visit: Payer: Self-pay | Admitting: Family Medicine

## 2018-09-22 NOTE — Telephone Encounter (Signed)
Vein specialist that pt was referred out to called to let us know that they reached out to Nicholas [name] and he told them he refused to come in the office as he was under the impression home health would come out. They recommended he come in but pt refused stated that there was no way that he could make it to the office. Just an [place]

## 2018-09-22 NOTE — Telephone Encounter (Signed)
Received call from Santiago Glad with [facility] she advised they will be excepting the patient. Santiago Glad asked for a call back if Autumn need to contact her. The number to contact Santiago Glad is [phone]

## 2018-09-22 NOTE — Telephone Encounter (Signed)
I called pt to advise that [facility] has called and advised that they would see the pt. I gave the number to the main office so that if he has not heard from them by tomorrow he can call and check the status. He is to call with update on Monday to let me know if he has been seen or at least scheduled.

## 2018-09-22 NOTE — Telephone Encounter (Signed)
Called [place] office [phone] and they asked that demo sheet, last office note to be faxed to [phone]. I asked for them to call the office and advise if they are able to accept this pt. Will continue to hold message pending advisement and will call the pt to also advise if they will be able to treat him. If they do not accept this assignment then he will have to come into the office to be treated here when he is able but will have to remove the dressings.

## 2018-09-22 NOTE — Telephone Encounter (Signed)
[facility] is not able to pick up this pt. [facility] is out of network for the pt. I will hold message and try [facility] to see if we can get services approved and contact the pt to see if we are able to get him in the office tomorrow to see if we can take off wraps and apply something else.

## 2018-09-22 NOTE — Telephone Encounter (Signed)
Spoke with patient and asked him why he was refusing to return to to vein specialist. He stated it "just about killed me" to go to [facility] the first time." He stated it hurts too bad trying to get in and out of the car and when he got home he had to take pain pills. I advised the patient I would send the message to Dr.Simpson letting her know he was refusing to return to the specialist.

## 2018-09-23 ENCOUNTER — Telehealth (INDEPENDENT_AMBULATORY_CARE_PROVIDER_SITE_OTHER): Payer: Self-pay

## 2018-09-23 ENCOUNTER — Other Ambulatory Visit (INDEPENDENT_AMBULATORY_CARE_PROVIDER_SITE_OTHER): Payer: Self-pay

## 2018-09-23 DIAGNOSIS — L97519 Non-pressure chronic ulcer of other part of right foot with unspecified severity: Secondary | ICD-10-CM | POA: Diagnosis not present

## 2018-09-23 DIAGNOSIS — I872 Venous insufficiency (chronic) (peripheral): Secondary | ICD-10-CM | POA: Diagnosis not present

## 2018-09-23 DIAGNOSIS — Z594 Lack of adequate food and safe drinking water: Secondary | ICD-10-CM | POA: Diagnosis not present

## 2018-09-23 DIAGNOSIS — E11610 Type 2 diabetes mellitus with diabetic neuropathic arthropathy: Secondary | ICD-10-CM | POA: Diagnosis not present

## 2018-09-23 DIAGNOSIS — L97221 Non-pressure chronic ulcer of left calf limited to breakdown of skin: Secondary | ICD-10-CM | POA: Diagnosis not present

## 2018-09-23 DIAGNOSIS — Z6841 Body Mass Index (BMI) 40.0 and over, adult: Secondary | ICD-10-CM | POA: Diagnosis not present

## 2018-09-23 DIAGNOSIS — Z4800 Encounter for change or removal of nonsurgical wound dressing: Secondary | ICD-10-CM | POA: Diagnosis not present

## 2018-09-23 DIAGNOSIS — L97319 Non-pressure chronic ulcer of right ankle with unspecified severity: Secondary | ICD-10-CM | POA: Diagnosis not present

## 2018-09-23 DIAGNOSIS — E1151 Type 2 diabetes mellitus with diabetic peripheral angiopathy without gangrene: Secondary | ICD-10-CM | POA: Diagnosis not present

## 2018-09-23 DIAGNOSIS — E1140 Type 2 diabetes mellitus with diabetic neuropathy, unspecified: Secondary | ICD-10-CM | POA: Diagnosis not present

## 2018-09-23 DIAGNOSIS — L97219 Non-pressure chronic ulcer of right calf with unspecified severity: Secondary | ICD-10-CM | POA: Diagnosis not present

## 2018-09-23 DIAGNOSIS — L97529 Non-pressure chronic ulcer of other part of left foot with unspecified severity: Secondary | ICD-10-CM | POA: Diagnosis not present

## 2018-09-23 MED ORDER — SILVER SULFADIAZINE 1 % EX CREA: 1.0000 "application " | TOPICAL_CREAM | Freq: Every day | CUTANEOUS | 0 refills | Status: AC

## 2018-09-23 NOTE — Telephone Encounter (Signed)
Nicholas [name], [facility] with [facility] called to clarify orders for patient.  Per Autumn F., Dr. Jess Barters assistant, patient is to have [employer] for bilateral leg once a week.  [employer] of message above per Autumn.   Cb# for Nicholas [name] is [phone].

## 2018-09-23 NOTE — Telephone Encounter (Signed)
Rx was sent. Malachy Mood was called and informed.

## 2018-09-23 NOTE — Telephone Encounter (Signed)
Malachy Mood, [facility] with [facility] called concerning  Rx for Silvadene for bilateral feet.  Stated that no Rx was sent in to patient's pharmacy.  Cb# is [phone].  Please advise.  Thank you.

## 2018-09-26 NOTE — Telephone Encounter (Signed)
He is going against medical advice , if there are more complications with the foot , he needs to go to the ED. He is aware of the severity of hies medical condition, diabetes, poor circulation and open wound, refusing to have a wound Doc supervise the ulcer is NOT a good decision at this time, if he changes his mind please pursue the [facility] option he is to let us know if that is the case

## 2018-09-26 NOTE — Telephone Encounter (Signed)
Patient refused to go to any office and is only wanting the home health which he is being [facility] by [facility] for his wounds. Please advise

## 2018-09-26 NOTE — Telephone Encounter (Signed)
pls see if Dr Donnetta Hutching I still taking pts in the [place] office, I doubt it, if not, please ensure that home health is treating the wound, also needed to be seen by Wound Specialist in [place], pls get back to me on this

## 2018-09-27 DIAGNOSIS — L97221 Non-pressure chronic ulcer of left calf limited to breakdown of skin: Secondary | ICD-10-CM | POA: Diagnosis not present

## 2018-09-27 DIAGNOSIS — L97529 Non-pressure chronic ulcer of other part of left foot with unspecified severity: Secondary | ICD-10-CM | POA: Diagnosis not present

## 2018-09-27 DIAGNOSIS — Z4800 Encounter for change or removal of nonsurgical wound dressing: Secondary | ICD-10-CM | POA: Diagnosis not present

## 2018-09-27 DIAGNOSIS — L97219 Non-pressure chronic ulcer of right calf with unspecified severity: Secondary | ICD-10-CM | POA: Diagnosis not present

## 2018-09-27 DIAGNOSIS — Z6841 Body Mass Index (BMI) 40.0 and over, adult: Secondary | ICD-10-CM | POA: Diagnosis not present

## 2018-09-27 DIAGNOSIS — L97319 Non-pressure chronic ulcer of right ankle with unspecified severity: Secondary | ICD-10-CM | POA: Diagnosis not present

## 2018-09-27 DIAGNOSIS — L97519 Non-pressure chronic ulcer of other part of right foot with unspecified severity: Secondary | ICD-10-CM | POA: Diagnosis not present

## 2018-09-27 DIAGNOSIS — I872 Venous insufficiency (chronic) (peripheral): Secondary | ICD-10-CM | POA: Diagnosis not present

## 2018-09-27 DIAGNOSIS — E1151 Type 2 diabetes mellitus with diabetic peripheral angiopathy without gangrene: Secondary | ICD-10-CM | POA: Diagnosis not present

## 2018-09-27 NOTE — Telephone Encounter (Signed)
Spoke with patient and he is waiting on the home health nurse to come out to wrap his legs today and advised again that he needed to be seen by wound care in [place] and he is just in so much pain that he is unable to get out of the house but if his condition worsens than he will call back and get [place] to get him in the [place] office for wound care.

## 2018-09-29 ENCOUNTER — Telehealth: Payer: Self-pay | Admitting: *Deleted

## 2018-09-29 NOTE — Telephone Encounter (Signed)
FYI

## 2018-09-29 NOTE — Telephone Encounter (Signed)
Pt LVM stating that home health came out to wrap his legs and clean the wound.  He was surprised at how good it looked. She is going to come back out and keep wrapping and redressing. He stated because it looked so good he was not going to go the dr that he was referred to and he would stick with the homehealth. Wanted to just let Dr. Moshe Cipro know.

## 2018-09-30 DIAGNOSIS — Z6841 Body Mass Index (BMI) 40.0 and over, adult: Secondary | ICD-10-CM | POA: Diagnosis not present

## 2018-09-30 DIAGNOSIS — L97221 Non-pressure chronic ulcer of left calf limited to breakdown of skin: Secondary | ICD-10-CM | POA: Diagnosis not present

## 2018-09-30 DIAGNOSIS — L97519 Non-pressure chronic ulcer of other part of right foot with unspecified severity: Secondary | ICD-10-CM | POA: Diagnosis not present

## 2018-09-30 DIAGNOSIS — L97219 Non-pressure chronic ulcer of right calf with unspecified severity: Secondary | ICD-10-CM | POA: Diagnosis not present

## 2018-09-30 DIAGNOSIS — L97529 Non-pressure chronic ulcer of other part of left foot with unspecified severity: Secondary | ICD-10-CM | POA: Diagnosis not present

## 2018-09-30 DIAGNOSIS — L97319 Non-pressure chronic ulcer of right ankle with unspecified severity: Secondary | ICD-10-CM | POA: Diagnosis not present

## 2018-09-30 DIAGNOSIS — Z4800 Encounter for change or removal of nonsurgical wound dressing: Secondary | ICD-10-CM | POA: Diagnosis not present

## 2018-09-30 DIAGNOSIS — I872 Venous insufficiency (chronic) (peripheral): Secondary | ICD-10-CM | POA: Diagnosis not present

## 2018-09-30 DIAGNOSIS — E1151 Type 2 diabetes mellitus with diabetic peripheral angiopathy without gangrene: Secondary | ICD-10-CM | POA: Diagnosis not present

## 2018-10-03 ENCOUNTER — Other Ambulatory Visit: Payer: Self-pay | Admitting: *Deleted

## 2018-10-03 DIAGNOSIS — L97319 Non-pressure chronic ulcer of right ankle with unspecified severity: Secondary | ICD-10-CM | POA: Diagnosis not present

## 2018-10-03 DIAGNOSIS — I872 Venous insufficiency (chronic) (peripheral): Secondary | ICD-10-CM | POA: Diagnosis not present

## 2018-10-03 DIAGNOSIS — Z4800 Encounter for change or removal of nonsurgical wound dressing: Secondary | ICD-10-CM | POA: Diagnosis not present

## 2018-10-03 DIAGNOSIS — E1151 Type 2 diabetes mellitus with diabetic peripheral angiopathy without gangrene: Secondary | ICD-10-CM | POA: Diagnosis not present

## 2018-10-03 DIAGNOSIS — L97529 Non-pressure chronic ulcer of other part of left foot with unspecified severity: Secondary | ICD-10-CM | POA: Diagnosis not present

## 2018-10-03 DIAGNOSIS — L97219 Non-pressure chronic ulcer of right calf with unspecified severity: Secondary | ICD-10-CM | POA: Diagnosis not present

## 2018-10-03 DIAGNOSIS — L97221 Non-pressure chronic ulcer of left calf limited to breakdown of skin: Secondary | ICD-10-CM | POA: Diagnosis not present

## 2018-10-03 DIAGNOSIS — L97519 Non-pressure chronic ulcer of other part of right foot with unspecified severity: Secondary | ICD-10-CM | POA: Diagnosis not present

## 2018-10-03 DIAGNOSIS — Z6841 Body Mass Index (BMI) 40.0 and over, adult: Secondary | ICD-10-CM | POA: Diagnosis not present

## 2018-10-04 ENCOUNTER — Telehealth: Payer: Self-pay | Admitting: Family Medicine

## 2018-10-04 DIAGNOSIS — L97221 Non-pressure chronic ulcer of left calf limited to breakdown of skin: Secondary | ICD-10-CM | POA: Diagnosis not present

## 2018-10-04 DIAGNOSIS — E1151 Type 2 diabetes mellitus with diabetic peripheral angiopathy without gangrene: Secondary | ICD-10-CM | POA: Diagnosis not present

## 2018-10-04 DIAGNOSIS — L97529 Non-pressure chronic ulcer of other part of left foot with unspecified severity: Secondary | ICD-10-CM | POA: Diagnosis not present

## 2018-10-04 DIAGNOSIS — L97319 Non-pressure chronic ulcer of right ankle with unspecified severity: Secondary | ICD-10-CM | POA: Diagnosis not present

## 2018-10-04 DIAGNOSIS — I872 Venous insufficiency (chronic) (peripheral): Secondary | ICD-10-CM | POA: Diagnosis not present

## 2018-10-04 DIAGNOSIS — L97219 Non-pressure chronic ulcer of right calf with unspecified severity: Secondary | ICD-10-CM | POA: Diagnosis not present

## 2018-10-04 DIAGNOSIS — L97519 Non-pressure chronic ulcer of other part of right foot with unspecified severity: Secondary | ICD-10-CM | POA: Diagnosis not present

## 2018-10-04 NOTE — Telephone Encounter (Signed)
VERBAL ORDER GIVEN

## 2018-10-04 NOTE — Telephone Encounter (Signed)
Social worker called and said Mr Sanker had a fall yesterday.  He hit his back on the tub.  She feels he needs a Education officer, museum consult.

## 2018-10-05 ENCOUNTER — Other Ambulatory Visit: Payer: Self-pay | Admitting: "Endocrinology

## 2018-10-10 NOTE — Patient Outreach (Signed)
[facility]) Care Management  10/10/2018  CEDRICK PARTAIN 01-04-1956 [id number]   CSW called to follow-up on resources mailed to patient. Patient states that his sister had checked his mailbox and looked over them briefly but has been pleased with [employer] Whe[employer] stating that they've even been sending additional food for him. Patient was appreciative of resources and plans to hold on to them and utilize the food pantries when he needs the additional food but for now he's in a good spot. CSW will close case as no further CSW needs identified at this time.    Raynaldo Opitz, LCSW [facility]  Clinical Social Worker cell #: [phone]

## 2018-10-11 DIAGNOSIS — L97219 Non-pressure chronic ulcer of right calf with unspecified severity: Secondary | ICD-10-CM | POA: Diagnosis not present

## 2018-10-11 DIAGNOSIS — L97221 Non-pressure chronic ulcer of left calf limited to breakdown of skin: Secondary | ICD-10-CM | POA: Diagnosis not present

## 2018-10-11 DIAGNOSIS — L97529 Non-pressure chronic ulcer of other part of left foot with unspecified severity: Secondary | ICD-10-CM | POA: Diagnosis not present

## 2018-10-11 DIAGNOSIS — E1151 Type 2 diabetes mellitus with diabetic peripheral angiopathy without gangrene: Secondary | ICD-10-CM | POA: Diagnosis not present

## 2018-10-11 DIAGNOSIS — I872 Venous insufficiency (chronic) (peripheral): Secondary | ICD-10-CM | POA: Diagnosis not present

## 2018-10-11 DIAGNOSIS — Z4800 Encounter for change or removal of nonsurgical wound dressing: Secondary | ICD-10-CM | POA: Diagnosis not present

## 2018-10-11 DIAGNOSIS — Z6841 Body Mass Index (BMI) 40.0 and over, adult: Secondary | ICD-10-CM | POA: Diagnosis not present

## 2018-10-11 DIAGNOSIS — L97519 Non-pressure chronic ulcer of other part of right foot with unspecified severity: Secondary | ICD-10-CM | POA: Diagnosis not present

## 2018-10-11 DIAGNOSIS — L97319 Non-pressure chronic ulcer of right ankle with unspecified severity: Secondary | ICD-10-CM | POA: Diagnosis not present

## 2018-10-12 DIAGNOSIS — E669 Obesity, unspecified: Secondary | ICD-10-CM

## 2018-10-12 DIAGNOSIS — E291 Testicular hypofunction: Secondary | ICD-10-CM

## 2018-10-12 DIAGNOSIS — E1169 Type 2 diabetes mellitus with other specified complication: Secondary | ICD-10-CM

## 2018-10-18 DIAGNOSIS — L97319 Non-pressure chronic ulcer of right ankle with unspecified severity: Secondary | ICD-10-CM | POA: Diagnosis not present

## 2018-10-18 DIAGNOSIS — L97519 Non-pressure chronic ulcer of other part of right foot with unspecified severity: Secondary | ICD-10-CM | POA: Diagnosis not present

## 2018-10-18 DIAGNOSIS — L97219 Non-pressure chronic ulcer of right calf with unspecified severity: Secondary | ICD-10-CM | POA: Diagnosis not present

## 2018-10-18 DIAGNOSIS — L97221 Non-pressure chronic ulcer of left calf limited to breakdown of skin: Secondary | ICD-10-CM | POA: Diagnosis not present

## 2018-10-18 DIAGNOSIS — L97529 Non-pressure chronic ulcer of other part of left foot with unspecified severity: Secondary | ICD-10-CM | POA: Diagnosis not present

## 2018-10-18 DIAGNOSIS — Z4800 Encounter for change or removal of nonsurgical wound dressing: Secondary | ICD-10-CM | POA: Diagnosis not present

## 2018-10-18 DIAGNOSIS — E1151 Type 2 diabetes mellitus with diabetic peripheral angiopathy without gangrene: Secondary | ICD-10-CM | POA: Diagnosis not present

## 2018-10-18 DIAGNOSIS — Z6841 Body Mass Index (BMI) 40.0 and over, adult: Secondary | ICD-10-CM | POA: Diagnosis not present

## 2018-10-18 DIAGNOSIS — I872 Venous insufficiency (chronic) (peripheral): Secondary | ICD-10-CM | POA: Diagnosis not present

## 2018-10-19 ENCOUNTER — Telehealth: Payer: Self-pay | Admitting: Family Medicine

## 2018-10-19 NOTE — Telephone Encounter (Signed)
Would like to extend visits for home health for 2 a week. Please call

## 2018-10-19 NOTE — Telephone Encounter (Signed)
Noted, thank you

## 2018-10-19 NOTE — Telephone Encounter (Signed)
Spoke with Nicholas [name] and she requested home health be extended to twice weekly for 4 weeks. Tops of legs are healing but feet still have some spots that aren't healing yet. Gave verbal OK. They will be faxing over the paperwork. Just an [place]

## 2018-10-21 ENCOUNTER — Other Ambulatory Visit: Payer: Self-pay | Admitting: Family Medicine

## 2018-10-25 ENCOUNTER — Other Ambulatory Visit: Payer: Self-pay | Admitting: Family Medicine

## 2018-10-25 ENCOUNTER — Ambulatory Visit: Payer: Medicare HMO | Admitting: Sports Medicine

## 2018-10-25 ENCOUNTER — Other Ambulatory Visit: Payer: Medicare HMO | Admitting: Orthotics

## 2018-10-26 DIAGNOSIS — L97319 Non-pressure chronic ulcer of right ankle with unspecified severity: Secondary | ICD-10-CM | POA: Diagnosis not present

## 2018-10-26 DIAGNOSIS — L97529 Non-pressure chronic ulcer of other part of left foot with unspecified severity: Secondary | ICD-10-CM | POA: Diagnosis not present

## 2018-10-26 DIAGNOSIS — L97519 Non-pressure chronic ulcer of other part of right foot with unspecified severity: Secondary | ICD-10-CM | POA: Diagnosis not present

## 2018-10-26 DIAGNOSIS — I872 Venous insufficiency (chronic) (peripheral): Secondary | ICD-10-CM | POA: Diagnosis not present

## 2018-10-26 DIAGNOSIS — L97221 Non-pressure chronic ulcer of left calf limited to breakdown of skin: Secondary | ICD-10-CM | POA: Diagnosis not present

## 2018-10-26 DIAGNOSIS — E1151 Type 2 diabetes mellitus with diabetic peripheral angiopathy without gangrene: Secondary | ICD-10-CM | POA: Diagnosis not present

## 2018-10-26 DIAGNOSIS — L97219 Non-pressure chronic ulcer of right calf with unspecified severity: Secondary | ICD-10-CM | POA: Diagnosis not present

## 2018-10-27 ENCOUNTER — Telehealth: Payer: Self-pay | Admitting: *Deleted

## 2018-10-27 NOTE — Telephone Encounter (Signed)
Nicholas [name] patients brother (NOT ON DPR) called wanting to speak with Dr. Moshe Cipro about Nicholas [name]. Nicholas [name] he had spoke with his sisters and Nicholas [name] is sick he is having back pain and throwing up but he is more concerned with getting Nicholas [name] placed somewhere. He is over 400lbs and now he cannot manouver around his house or even get outside. He said he had decline dratically in the last few weeks and wanted to see what he could do about getting him placed somewhere. He is not on the DPR so I let him know that he wasn't and I could not release any information to him. Also let him know if Nicholas [name] was sick he could call in the office and do a phone visit for the time being. But we would have to speak to the contact on his DPR. He said he would get in touch with Nicholas [name] and see what he wanted to do.

## 2018-10-27 NOTE — Telephone Encounter (Signed)
Sister, [name] called back and was concerned about Nicholas [name] being in severe pain and not being able to walk hardly at all and living in a small apartment. His social worker is working to get him placed but has to wait until 5/1 to process paperwork. Unable to walk to the car and has been nauseated today due to the pain that he describes as in his lower back and radiating all over his body. Had a couple falls recently and dr s advised me to let the sister know that he needs evaluation at the er and that EMS would need to transport him there. She agrees

## 2018-11-01 ENCOUNTER — Other Ambulatory Visit: Payer: Self-pay

## 2018-11-01 DIAGNOSIS — L97221 Non-pressure chronic ulcer of left calf limited to breakdown of skin: Secondary | ICD-10-CM | POA: Diagnosis not present

## 2018-11-01 DIAGNOSIS — E1151 Type 2 diabetes mellitus with diabetic peripheral angiopathy without gangrene: Secondary | ICD-10-CM | POA: Diagnosis not present

## 2018-11-01 DIAGNOSIS — Z4800 Encounter for change or removal of nonsurgical wound dressing: Secondary | ICD-10-CM | POA: Diagnosis not present

## 2018-11-01 DIAGNOSIS — L97519 Non-pressure chronic ulcer of other part of right foot with unspecified severity: Secondary | ICD-10-CM | POA: Diagnosis not present

## 2018-11-01 DIAGNOSIS — L97529 Non-pressure chronic ulcer of other part of left foot with unspecified severity: Secondary | ICD-10-CM | POA: Diagnosis not present

## 2018-11-01 DIAGNOSIS — L97219 Non-pressure chronic ulcer of right calf with unspecified severity: Secondary | ICD-10-CM | POA: Diagnosis not present

## 2018-11-01 DIAGNOSIS — I872 Venous insufficiency (chronic) (peripheral): Secondary | ICD-10-CM | POA: Diagnosis not present

## 2018-11-01 DIAGNOSIS — L97319 Non-pressure chronic ulcer of right ankle with unspecified severity: Secondary | ICD-10-CM | POA: Diagnosis not present

## 2018-11-01 DIAGNOSIS — Z6841 Body Mass Index (BMI) 40.0 and over, adult: Secondary | ICD-10-CM | POA: Diagnosis not present

## 2018-11-01 MED ORDER — BRIMONIDINE TARTRATE-TIMOLOL 0.2-0.5 % OP SOLN: 1.0000 [drp] | Freq: Two times a day (BID) | OPHTHALMIC | 0 refills | Status: AC

## 2018-11-02 NOTE — Addendum Note (Signed)
Addended by: Eual Fines on: 11/02/2018 01:58 PM   Modules accepted: Orders

## 2018-11-04 DIAGNOSIS — L97221 Non-pressure chronic ulcer of left calf limited to breakdown of skin: Secondary | ICD-10-CM | POA: Diagnosis not present

## 2018-11-04 DIAGNOSIS — Z6841 Body Mass Index (BMI) 40.0 and over, adult: Secondary | ICD-10-CM | POA: Diagnosis not present

## 2018-11-04 DIAGNOSIS — L97519 Non-pressure chronic ulcer of other part of right foot with unspecified severity: Secondary | ICD-10-CM | POA: Diagnosis not present

## 2018-11-04 DIAGNOSIS — L97219 Non-pressure chronic ulcer of right calf with unspecified severity: Secondary | ICD-10-CM | POA: Diagnosis not present

## 2018-11-04 DIAGNOSIS — E1151 Type 2 diabetes mellitus with diabetic peripheral angiopathy without gangrene: Secondary | ICD-10-CM | POA: Diagnosis not present

## 2018-11-04 DIAGNOSIS — L97319 Non-pressure chronic ulcer of right ankle with unspecified severity: Secondary | ICD-10-CM | POA: Diagnosis not present

## 2018-11-04 DIAGNOSIS — L97529 Non-pressure chronic ulcer of other part of left foot with unspecified severity: Secondary | ICD-10-CM | POA: Diagnosis not present

## 2018-11-04 DIAGNOSIS — Z4800 Encounter for change or removal of nonsurgical wound dressing: Secondary | ICD-10-CM | POA: Diagnosis not present

## 2018-11-04 DIAGNOSIS — I872 Venous insufficiency (chronic) (peripheral): Secondary | ICD-10-CM | POA: Diagnosis not present

## 2018-11-05 ENCOUNTER — Other Ambulatory Visit: Payer: Self-pay | Admitting: "Endocrinology

## 2018-11-07 DIAGNOSIS — Z6841 Body Mass Index (BMI) 40.0 and over, adult: Secondary | ICD-10-CM | POA: Diagnosis not present

## 2018-11-07 DIAGNOSIS — L97519 Non-pressure chronic ulcer of other part of right foot with unspecified severity: Secondary | ICD-10-CM | POA: Diagnosis not present

## 2018-11-07 DIAGNOSIS — L97221 Non-pressure chronic ulcer of left calf limited to breakdown of skin: Secondary | ICD-10-CM | POA: Diagnosis not present

## 2018-11-07 DIAGNOSIS — E1151 Type 2 diabetes mellitus with diabetic peripheral angiopathy without gangrene: Secondary | ICD-10-CM | POA: Diagnosis not present

## 2018-11-07 DIAGNOSIS — L97529 Non-pressure chronic ulcer of other part of left foot with unspecified severity: Secondary | ICD-10-CM | POA: Diagnosis not present

## 2018-11-07 DIAGNOSIS — I872 Venous insufficiency (chronic) (peripheral): Secondary | ICD-10-CM | POA: Diagnosis not present

## 2018-11-07 DIAGNOSIS — Z4800 Encounter for change or removal of nonsurgical wound dressing: Secondary | ICD-10-CM | POA: Diagnosis not present

## 2018-11-07 DIAGNOSIS — L97219 Non-pressure chronic ulcer of right calf with unspecified severity: Secondary | ICD-10-CM | POA: Diagnosis not present

## 2018-11-07 DIAGNOSIS — L97319 Non-pressure chronic ulcer of right ankle with unspecified severity: Secondary | ICD-10-CM | POA: Diagnosis not present

## 2018-11-10 DIAGNOSIS — L97529 Non-pressure chronic ulcer of other part of left foot with unspecified severity: Secondary | ICD-10-CM | POA: Diagnosis not present

## 2018-11-10 DIAGNOSIS — I872 Venous insufficiency (chronic) (peripheral): Secondary | ICD-10-CM | POA: Diagnosis not present

## 2018-11-10 DIAGNOSIS — Z6841 Body Mass Index (BMI) 40.0 and over, adult: Secondary | ICD-10-CM | POA: Diagnosis not present

## 2018-11-10 DIAGNOSIS — L97519 Non-pressure chronic ulcer of other part of right foot with unspecified severity: Secondary | ICD-10-CM | POA: Diagnosis not present

## 2018-11-10 DIAGNOSIS — L97219 Non-pressure chronic ulcer of right calf with unspecified severity: Secondary | ICD-10-CM | POA: Diagnosis not present

## 2018-11-10 DIAGNOSIS — L97319 Non-pressure chronic ulcer of right ankle with unspecified severity: Secondary | ICD-10-CM | POA: Diagnosis not present

## 2018-11-10 DIAGNOSIS — Z4800 Encounter for change or removal of nonsurgical wound dressing: Secondary | ICD-10-CM | POA: Diagnosis not present

## 2018-11-10 DIAGNOSIS — L97221 Non-pressure chronic ulcer of left calf limited to breakdown of skin: Secondary | ICD-10-CM | POA: Diagnosis not present

## 2018-11-10 DIAGNOSIS — E1151 Type 2 diabetes mellitus with diabetic peripheral angiopathy without gangrene: Secondary | ICD-10-CM | POA: Diagnosis not present

## 2018-11-13 ENCOUNTER — Other Ambulatory Visit: Payer: Self-pay | Admitting: Family Medicine

## 2018-11-14 ENCOUNTER — Telehealth: Payer: Self-pay | Admitting: Family Medicine

## 2018-11-14 NOTE — Telephone Encounter (Signed)
Pt having back pain and is requesting pain med called in

## 2018-11-14 NOTE — Telephone Encounter (Signed)
Can he be scheduled for a phone visit? (his sister said the last time that his phone didn't work)

## 2018-11-15 ENCOUNTER — Other Ambulatory Visit: Payer: Self-pay | Admitting: Family Medicine

## 2018-11-15 DIAGNOSIS — L97219 Non-pressure chronic ulcer of right calf with unspecified severity: Secondary | ICD-10-CM | POA: Diagnosis not present

## 2018-11-15 DIAGNOSIS — E1151 Type 2 diabetes mellitus with diabetic peripheral angiopathy without gangrene: Secondary | ICD-10-CM | POA: Diagnosis not present

## 2018-11-15 DIAGNOSIS — L97221 Non-pressure chronic ulcer of left calf limited to breakdown of skin: Secondary | ICD-10-CM | POA: Diagnosis not present

## 2018-11-15 DIAGNOSIS — L97529 Non-pressure chronic ulcer of other part of left foot with unspecified severity: Secondary | ICD-10-CM | POA: Diagnosis not present

## 2018-11-15 DIAGNOSIS — L97319 Non-pressure chronic ulcer of right ankle with unspecified severity: Secondary | ICD-10-CM | POA: Diagnosis not present

## 2018-11-15 DIAGNOSIS — L97519 Non-pressure chronic ulcer of other part of right foot with unspecified severity: Secondary | ICD-10-CM | POA: Diagnosis not present

## 2018-11-15 DIAGNOSIS — I872 Venous insufficiency (chronic) (peripheral): Secondary | ICD-10-CM | POA: Diagnosis not present

## 2018-11-15 DIAGNOSIS — Z6841 Body Mass Index (BMI) 40.0 and over, adult: Secondary | ICD-10-CM | POA: Diagnosis not present

## 2018-11-15 DIAGNOSIS — Z4800 Encounter for change or removal of nonsurgical wound dressing: Secondary | ICD-10-CM | POA: Diagnosis not present

## 2018-11-16 ENCOUNTER — Telehealth: Payer: Self-pay

## 2018-11-16 ENCOUNTER — Other Ambulatory Visit: Payer: Self-pay | Admitting: Family Medicine

## 2018-11-16 DIAGNOSIS — M549 Dorsalgia, unspecified: Secondary | ICD-10-CM

## 2018-11-16 MED ORDER — PREDNISONE 10 MG (21) PO TBPK: ORAL_TABLET | ORAL | 0 refills | Status: DC

## 2018-11-16 NOTE — Telephone Encounter (Signed)
Called patient to inquire if he had been placed in a home. He stated they are working on it but as of right now he has not. It looks like he will be placed if all goes as planned in [facility] in [place] but there is no time line right now.  He is requesting a stronger pain medicine for his back pain. He states his back is killing him and he can't handle the pain. I advised him I would send a message.

## 2018-11-16 NOTE — Telephone Encounter (Signed)
Sorry to hear, last time we discussed in office he agreed to go back to Dr Merlene Laughter fioor pain management and was referred in 03/2018. Already on high doses of gabapentin and tramadol, so I am referring him again, I hae sent the referral in I will send prednisone dose pack short term

## 2018-11-16 NOTE — Progress Notes (Signed)
pred 5  

## 2018-11-16 NOTE — Telephone Encounter (Signed)
Noted, thanks!

## 2018-11-16 NOTE — Telephone Encounter (Signed)
Spoke with patient and let him know treatment plan. He stated he couldn't go see Dr.Doonquah because he couldn't leave his house. He has sold his car and is trying to get into a rest home. If it involved him leaving his house to see any doctors he couldn't do it. I advised him Prednisone had been sent in for short term he verbalized understanding. Advised him when they called to try and schedule him an appt and if he still didn't want to go he could refuse the appt. He verbalized understanding.

## 2018-11-18 DIAGNOSIS — Z6841 Body Mass Index (BMI) 40.0 and over, adult: Secondary | ICD-10-CM | POA: Diagnosis not present

## 2018-11-18 DIAGNOSIS — L97219 Non-pressure chronic ulcer of right calf with unspecified severity: Secondary | ICD-10-CM | POA: Diagnosis not present

## 2018-11-18 DIAGNOSIS — L97319 Non-pressure chronic ulcer of right ankle with unspecified severity: Secondary | ICD-10-CM | POA: Diagnosis not present

## 2018-11-18 DIAGNOSIS — L97221 Non-pressure chronic ulcer of left calf limited to breakdown of skin: Secondary | ICD-10-CM | POA: Diagnosis not present

## 2018-11-18 DIAGNOSIS — L97529 Non-pressure chronic ulcer of other part of left foot with unspecified severity: Secondary | ICD-10-CM | POA: Diagnosis not present

## 2018-11-18 DIAGNOSIS — Z4800 Encounter for change or removal of nonsurgical wound dressing: Secondary | ICD-10-CM | POA: Diagnosis not present

## 2018-11-18 DIAGNOSIS — I872 Venous insufficiency (chronic) (peripheral): Secondary | ICD-10-CM | POA: Diagnosis not present

## 2018-11-18 DIAGNOSIS — L97519 Non-pressure chronic ulcer of other part of right foot with unspecified severity: Secondary | ICD-10-CM | POA: Diagnosis not present

## 2018-11-18 DIAGNOSIS — E1151 Type 2 diabetes mellitus with diabetic peripheral angiopathy without gangrene: Secondary | ICD-10-CM | POA: Diagnosis not present

## 2018-11-21 ENCOUNTER — Other Ambulatory Visit: Payer: Self-pay | Admitting: Family Medicine

## 2018-11-23 DIAGNOSIS — E1151 Type 2 diabetes mellitus with diabetic peripheral angiopathy without gangrene: Secondary | ICD-10-CM | POA: Diagnosis not present

## 2018-11-23 DIAGNOSIS — Z6841 Body Mass Index (BMI) 40.0 and over, adult: Secondary | ICD-10-CM | POA: Diagnosis not present

## 2018-11-23 DIAGNOSIS — L97219 Non-pressure chronic ulcer of right calf with unspecified severity: Secondary | ICD-10-CM | POA: Diagnosis not present

## 2018-11-23 DIAGNOSIS — I872 Venous insufficiency (chronic) (peripheral): Secondary | ICD-10-CM | POA: Diagnosis not present

## 2018-11-23 DIAGNOSIS — L97319 Non-pressure chronic ulcer of right ankle with unspecified severity: Secondary | ICD-10-CM | POA: Diagnosis not present

## 2018-11-23 DIAGNOSIS — L97221 Non-pressure chronic ulcer of left calf limited to breakdown of skin: Secondary | ICD-10-CM | POA: Diagnosis not present

## 2018-11-23 DIAGNOSIS — L97529 Non-pressure chronic ulcer of other part of left foot with unspecified severity: Secondary | ICD-10-CM | POA: Diagnosis not present

## 2018-11-23 DIAGNOSIS — L97519 Non-pressure chronic ulcer of other part of right foot with unspecified severity: Secondary | ICD-10-CM | POA: Diagnosis not present

## 2018-11-23 DIAGNOSIS — Z4800 Encounter for change or removal of nonsurgical wound dressing: Secondary | ICD-10-CM | POA: Diagnosis not present

## 2018-11-25 DIAGNOSIS — L97519 Non-pressure chronic ulcer of other part of right foot with unspecified severity: Secondary | ICD-10-CM | POA: Diagnosis not present

## 2018-11-25 DIAGNOSIS — L97221 Non-pressure chronic ulcer of left calf limited to breakdown of skin: Secondary | ICD-10-CM | POA: Diagnosis not present

## 2018-11-25 DIAGNOSIS — I872 Venous insufficiency (chronic) (peripheral): Secondary | ICD-10-CM | POA: Diagnosis not present

## 2018-11-25 DIAGNOSIS — Z6841 Body Mass Index (BMI) 40.0 and over, adult: Secondary | ICD-10-CM | POA: Diagnosis not present

## 2018-11-25 DIAGNOSIS — L97529 Non-pressure chronic ulcer of other part of left foot with unspecified severity: Secondary | ICD-10-CM | POA: Diagnosis not present

## 2018-11-25 DIAGNOSIS — E1151 Type 2 diabetes mellitus with diabetic peripheral angiopathy without gangrene: Secondary | ICD-10-CM | POA: Diagnosis not present

## 2018-11-25 DIAGNOSIS — L97319 Non-pressure chronic ulcer of right ankle with unspecified severity: Secondary | ICD-10-CM | POA: Diagnosis not present

## 2018-11-25 DIAGNOSIS — Z4800 Encounter for change or removal of nonsurgical wound dressing: Secondary | ICD-10-CM | POA: Diagnosis not present

## 2018-11-25 DIAGNOSIS — L97219 Non-pressure chronic ulcer of right calf with unspecified severity: Secondary | ICD-10-CM | POA: Diagnosis not present

## 2018-11-28 DIAGNOSIS — L97221 Non-pressure chronic ulcer of left calf limited to breakdown of skin: Secondary | ICD-10-CM | POA: Diagnosis not present

## 2018-11-28 DIAGNOSIS — L97529 Non-pressure chronic ulcer of other part of left foot with unspecified severity: Secondary | ICD-10-CM | POA: Diagnosis not present

## 2018-11-28 DIAGNOSIS — L97319 Non-pressure chronic ulcer of right ankle with unspecified severity: Secondary | ICD-10-CM | POA: Diagnosis not present

## 2018-11-28 DIAGNOSIS — E1151 Type 2 diabetes mellitus with diabetic peripheral angiopathy without gangrene: Secondary | ICD-10-CM | POA: Diagnosis not present

## 2018-11-28 DIAGNOSIS — L97519 Non-pressure chronic ulcer of other part of right foot with unspecified severity: Secondary | ICD-10-CM | POA: Diagnosis not present

## 2018-11-28 DIAGNOSIS — I872 Venous insufficiency (chronic) (peripheral): Secondary | ICD-10-CM | POA: Diagnosis not present

## 2018-11-28 DIAGNOSIS — L97219 Non-pressure chronic ulcer of right calf with unspecified severity: Secondary | ICD-10-CM | POA: Diagnosis not present

## 2018-11-29 DIAGNOSIS — L97529 Non-pressure chronic ulcer of other part of left foot with unspecified severity: Secondary | ICD-10-CM | POA: Diagnosis not present

## 2018-11-29 DIAGNOSIS — Z4800 Encounter for change or removal of nonsurgical wound dressing: Secondary | ICD-10-CM | POA: Diagnosis not present

## 2018-11-29 DIAGNOSIS — E1151 Type 2 diabetes mellitus with diabetic peripheral angiopathy without gangrene: Secondary | ICD-10-CM | POA: Diagnosis not present

## 2018-11-29 DIAGNOSIS — L97219 Non-pressure chronic ulcer of right calf with unspecified severity: Secondary | ICD-10-CM | POA: Diagnosis not present

## 2018-11-29 DIAGNOSIS — Z6841 Body Mass Index (BMI) 40.0 and over, adult: Secondary | ICD-10-CM | POA: Diagnosis not present

## 2018-11-29 DIAGNOSIS — L97319 Non-pressure chronic ulcer of right ankle with unspecified severity: Secondary | ICD-10-CM | POA: Diagnosis not present

## 2018-11-29 DIAGNOSIS — L97221 Non-pressure chronic ulcer of left calf limited to breakdown of skin: Secondary | ICD-10-CM | POA: Diagnosis not present

## 2018-11-29 DIAGNOSIS — L97519 Non-pressure chronic ulcer of other part of right foot with unspecified severity: Secondary | ICD-10-CM | POA: Diagnosis not present

## 2018-11-29 DIAGNOSIS — I872 Venous insufficiency (chronic) (peripheral): Secondary | ICD-10-CM | POA: Diagnosis not present

## 2018-11-30 ENCOUNTER — Telehealth: Payer: Self-pay | Admitting: Family Medicine

## 2018-11-30 NOTE — Telephone Encounter (Signed)
Returned [employer]. No answer. Left generic vm requesting call back

## 2018-11-30 NOTE — Telephone Encounter (Signed)
Unable to return call because no phone number was left with message.

## 2018-11-30 NOTE — Telephone Encounter (Signed)
Called [facility] and asked for Larene Beach working with patient. Was given the number [phone]. Left a generic message on vm requesting a call back.

## 2018-11-30 NOTE — Telephone Encounter (Signed)
Gave verbal orders for wound care after speaking with case manager

## 2018-11-30 NOTE — Telephone Encounter (Signed)
Larene Beach is calling for verbal orders for wound care

## 2018-12-01 DIAGNOSIS — L97219 Non-pressure chronic ulcer of right calf with unspecified severity: Secondary | ICD-10-CM | POA: Diagnosis not present

## 2018-12-01 DIAGNOSIS — L97529 Non-pressure chronic ulcer of other part of left foot with unspecified severity: Secondary | ICD-10-CM | POA: Diagnosis not present

## 2018-12-01 DIAGNOSIS — L97319 Non-pressure chronic ulcer of right ankle with unspecified severity: Secondary | ICD-10-CM | POA: Diagnosis not present

## 2018-12-01 DIAGNOSIS — L97519 Non-pressure chronic ulcer of other part of right foot with unspecified severity: Secondary | ICD-10-CM | POA: Diagnosis not present

## 2018-12-01 DIAGNOSIS — Z4800 Encounter for change or removal of nonsurgical wound dressing: Secondary | ICD-10-CM | POA: Diagnosis not present

## 2018-12-01 DIAGNOSIS — I872 Venous insufficiency (chronic) (peripheral): Secondary | ICD-10-CM | POA: Diagnosis not present

## 2018-12-01 DIAGNOSIS — E1151 Type 2 diabetes mellitus with diabetic peripheral angiopathy without gangrene: Secondary | ICD-10-CM | POA: Diagnosis not present

## 2018-12-01 DIAGNOSIS — L97221 Non-pressure chronic ulcer of left calf limited to breakdown of skin: Secondary | ICD-10-CM | POA: Diagnosis not present

## 2018-12-01 DIAGNOSIS — Z6841 Body Mass Index (BMI) 40.0 and over, adult: Secondary | ICD-10-CM | POA: Diagnosis not present

## 2018-12-06 ENCOUNTER — Telehealth: Payer: Self-pay | Admitting: *Deleted

## 2018-12-06 ENCOUNTER — Other Ambulatory Visit: Payer: Self-pay | Admitting: "Endocrinology

## 2018-12-06 DIAGNOSIS — L97529 Non-pressure chronic ulcer of other part of left foot with unspecified severity: Secondary | ICD-10-CM | POA: Diagnosis not present

## 2018-12-06 DIAGNOSIS — Z4800 Encounter for change or removal of nonsurgical wound dressing: Secondary | ICD-10-CM | POA: Diagnosis not present

## 2018-12-06 DIAGNOSIS — E1151 Type 2 diabetes mellitus with diabetic peripheral angiopathy without gangrene: Secondary | ICD-10-CM | POA: Diagnosis not present

## 2018-12-06 DIAGNOSIS — Z6841 Body Mass Index (BMI) 40.0 and over, adult: Secondary | ICD-10-CM | POA: Diagnosis not present

## 2018-12-06 DIAGNOSIS — L97519 Non-pressure chronic ulcer of other part of right foot with unspecified severity: Secondary | ICD-10-CM | POA: Diagnosis not present

## 2018-12-06 DIAGNOSIS — L97221 Non-pressure chronic ulcer of left calf limited to breakdown of skin: Secondary | ICD-10-CM | POA: Diagnosis not present

## 2018-12-06 DIAGNOSIS — L97319 Non-pressure chronic ulcer of right ankle with unspecified severity: Secondary | ICD-10-CM | POA: Diagnosis not present

## 2018-12-06 DIAGNOSIS — L97219 Non-pressure chronic ulcer of right calf with unspecified severity: Secondary | ICD-10-CM | POA: Diagnosis not present

## 2018-12-06 DIAGNOSIS — I872 Venous insufficiency (chronic) (peripheral): Secondary | ICD-10-CM | POA: Diagnosis not present

## 2018-12-06 NOTE — Telephone Encounter (Signed)
[name] with [facility] called went to see Mr. Nicholas [name]. Pt had wound on his feet that were already noted but she said he had a new wound on the side of his right foot and they were cleaning it dressing it and wrapping it. Would like a call back at [phone]

## 2018-12-07 DIAGNOSIS — E1151 Type 2 diabetes mellitus with diabetic peripheral angiopathy without gangrene: Secondary | ICD-10-CM | POA: Diagnosis not present

## 2018-12-07 DIAGNOSIS — L97529 Non-pressure chronic ulcer of other part of left foot with unspecified severity: Secondary | ICD-10-CM | POA: Diagnosis not present

## 2018-12-07 DIAGNOSIS — I872 Venous insufficiency (chronic) (peripheral): Secondary | ICD-10-CM | POA: Diagnosis not present

## 2018-12-07 DIAGNOSIS — L97221 Non-pressure chronic ulcer of left calf limited to breakdown of skin: Secondary | ICD-10-CM | POA: Diagnosis not present

## 2018-12-07 DIAGNOSIS — L97519 Non-pressure chronic ulcer of other part of right foot with unspecified severity: Secondary | ICD-10-CM | POA: Diagnosis not present

## 2018-12-07 DIAGNOSIS — L97219 Non-pressure chronic ulcer of right calf with unspecified severity: Secondary | ICD-10-CM | POA: Diagnosis not present

## 2018-12-07 DIAGNOSIS — L97319 Non-pressure chronic ulcer of right ankle with unspecified severity: Secondary | ICD-10-CM | POA: Diagnosis not present

## 2018-12-07 NOTE — Telephone Encounter (Signed)
Spoke with [name] and gave verbal ok for wrappings.

## 2018-12-07 NOTE — Telephone Encounter (Signed)
Nicholas [name] called back and said there was New area on right foot. They were using calcium agenate and covering with gauze. They were also  wrapping his whole legs with curlex and codan. Needed verbal oks for this. Can be reached at [phone]

## 2018-12-07 NOTE — Telephone Encounter (Signed)
FYI

## 2018-12-08 ENCOUNTER — Telehealth: Payer: Self-pay | Admitting: *Deleted

## 2018-12-08 DIAGNOSIS — L97529 Non-pressure chronic ulcer of other part of left foot with unspecified severity: Secondary | ICD-10-CM | POA: Diagnosis not present

## 2018-12-08 DIAGNOSIS — L97519 Non-pressure chronic ulcer of other part of right foot with unspecified severity: Secondary | ICD-10-CM | POA: Diagnosis not present

## 2018-12-08 DIAGNOSIS — L97221 Non-pressure chronic ulcer of left calf limited to breakdown of skin: Secondary | ICD-10-CM | POA: Diagnosis not present

## 2018-12-08 DIAGNOSIS — L97219 Non-pressure chronic ulcer of right calf with unspecified severity: Secondary | ICD-10-CM | POA: Diagnosis not present

## 2018-12-08 DIAGNOSIS — I872 Venous insufficiency (chronic) (peripheral): Secondary | ICD-10-CM | POA: Diagnosis not present

## 2018-12-08 DIAGNOSIS — E1151 Type 2 diabetes mellitus with diabetic peripheral angiopathy without gangrene: Secondary | ICD-10-CM | POA: Diagnosis not present

## 2018-12-08 DIAGNOSIS — Z4800 Encounter for change or removal of nonsurgical wound dressing: Secondary | ICD-10-CM | POA: Diagnosis not present

## 2018-12-08 DIAGNOSIS — L97319 Non-pressure chronic ulcer of right ankle with unspecified severity: Secondary | ICD-10-CM | POA: Diagnosis not present

## 2018-12-08 DIAGNOSIS — Z6841 Body Mass Index (BMI) 40.0 and over, adult: Secondary | ICD-10-CM | POA: Diagnosis not present

## 2018-12-08 NOTE — Telephone Encounter (Signed)
I do not see any ED vsits in our system, HOWEVER , I recommend ED assessment for recurrent falls , if safety is an issue this can be reason to admit

## 2018-12-08 NOTE — Telephone Encounter (Signed)
Noted, thanks!

## 2018-12-08 NOTE — Telephone Encounter (Signed)
Aleta pts sister (on [place]) called wanting to know what she could do about getting Nicholas [name] admitted to the hospital. She said he had fell 3 weeks ago and they keep having to take him back to the hospital but each time they are sending him home and its getting too hard to get him in the car. They keep taking him but ER keeps sending him home and she wanted to know what to do about it. Can be reached at [phone]

## 2018-12-09 DIAGNOSIS — L97221 Non-pressure chronic ulcer of left calf limited to breakdown of skin: Secondary | ICD-10-CM | POA: Diagnosis not present

## 2018-12-09 DIAGNOSIS — I872 Venous insufficiency (chronic) (peripheral): Secondary | ICD-10-CM | POA: Diagnosis not present

## 2018-12-09 DIAGNOSIS — E1151 Type 2 diabetes mellitus with diabetic peripheral angiopathy without gangrene: Secondary | ICD-10-CM | POA: Diagnosis not present

## 2018-12-09 DIAGNOSIS — L97219 Non-pressure chronic ulcer of right calf with unspecified severity: Secondary | ICD-10-CM | POA: Diagnosis not present

## 2018-12-12 ENCOUNTER — Telehealth: Payer: Self-pay | Admitting: *Deleted

## 2018-12-12 NOTE — Telephone Encounter (Signed)
[phone]   Stated when he went to the ER that they sent him home and told him to call his PCP and when he does he is sent back there.

## 2018-12-12 NOTE — Telephone Encounter (Signed)
States he feel badly, hurts all over,  having chills, unable to move, states he needs help with pain management , takes med irregularly, uncontrolled back pain, states he is not  Suicidal Ppls contact home health nurse, seems as tho all pt needs is pain management , he states his wound is closed, After spe  Needs updated labs from home health cbc, cmp and EGFr, HBA1C and tSH asap ( nor stat)

## 2018-12-12 NOTE — Telephone Encounter (Signed)
Pt sister Nicholas [name] called ( on DPR) she was requesting a call back as she said Nicholas [name] is now homebound. He is no longer able to get in the bed he stays in a chair. She called very concerned as he is now saying he wants to kill himself. He has stopped eating the meals that meals on wheels brings him and he has stopped taking his insulin as he says he has nothing to live for anymore. She needs to know what she can do to get him placed. Says she tried to call 911 to transport him to ER but he refuses to go and she doesn't know what else she can do but she is very concerned for his well being. She has no one to help them take care of him and she needs guidance.

## 2018-12-13 ENCOUNTER — Other Ambulatory Visit: Payer: Self-pay

## 2018-12-13 ENCOUNTER — Telehealth: Payer: Self-pay

## 2018-12-13 DIAGNOSIS — L97529 Non-pressure chronic ulcer of other part of left foot with unspecified severity: Secondary | ICD-10-CM | POA: Diagnosis not present

## 2018-12-13 DIAGNOSIS — E782 Mixed hyperlipidemia: Secondary | ICD-10-CM

## 2018-12-13 DIAGNOSIS — I10 Essential (primary) hypertension: Secondary | ICD-10-CM

## 2018-12-13 DIAGNOSIS — E669 Obesity, unspecified: Secondary | ICD-10-CM

## 2018-12-13 DIAGNOSIS — E1151 Type 2 diabetes mellitus with diabetic peripheral angiopathy without gangrene: Secondary | ICD-10-CM | POA: Diagnosis not present

## 2018-12-13 DIAGNOSIS — E1169 Type 2 diabetes mellitus with other specified complication: Secondary | ICD-10-CM

## 2018-12-13 DIAGNOSIS — L97319 Non-pressure chronic ulcer of right ankle with unspecified severity: Secondary | ICD-10-CM | POA: Diagnosis not present

## 2018-12-13 DIAGNOSIS — E039 Hypothyroidism, unspecified: Secondary | ICD-10-CM

## 2018-12-13 DIAGNOSIS — Z6841 Body Mass Index (BMI) 40.0 and over, adult: Secondary | ICD-10-CM | POA: Diagnosis not present

## 2018-12-13 DIAGNOSIS — L97519 Non-pressure chronic ulcer of other part of right foot with unspecified severity: Secondary | ICD-10-CM | POA: Diagnosis not present

## 2018-12-13 DIAGNOSIS — E559 Vitamin D deficiency, unspecified: Secondary | ICD-10-CM

## 2018-12-13 DIAGNOSIS — Z4800 Encounter for change or removal of nonsurgical wound dressing: Secondary | ICD-10-CM | POA: Diagnosis not present

## 2018-12-13 DIAGNOSIS — L97219 Non-pressure chronic ulcer of right calf with unspecified severity: Secondary | ICD-10-CM | POA: Diagnosis not present

## 2018-12-13 DIAGNOSIS — I872 Venous insufficiency (chronic) (peripheral): Secondary | ICD-10-CM | POA: Diagnosis not present

## 2018-12-13 DIAGNOSIS — L97221 Non-pressure chronic ulcer of left calf limited to breakdown of skin: Secondary | ICD-10-CM | POA: Diagnosis not present

## 2018-12-13 MED ORDER — VITAMIN D (ERGOCALCIFEROL) 1.25 MG (50000 UNIT) PO CAPS: 50000.0000 [IU] | ORAL_CAPSULE | ORAL | 0 refills | Status: AC

## 2018-12-13 NOTE — Telephone Encounter (Signed)
Called and left vm for shannon to call back to let her know he needs labs drawn and for her to assess the wound and give an update as to the healing.  I faxed orders to [facility] as well.   Called sister Hilda Blades and gave her an update as requested

## 2018-12-13 NOTE — Telephone Encounter (Signed)
Nicholas [name], CMA  

## 2018-12-13 NOTE — Telephone Encounter (Signed)
See previous message

## 2018-12-13 NOTE — Telephone Encounter (Signed)
Larene Beach aware and orders faxed

## 2018-12-14 ENCOUNTER — Telehealth: Payer: Self-pay | Admitting: Internal Medicine

## 2018-12-14 ENCOUNTER — Telehealth: Payer: Self-pay

## 2018-12-14 ENCOUNTER — Telehealth: Payer: Self-pay | Admitting: Family Medicine

## 2018-12-14 DIAGNOSIS — L97519 Non-pressure chronic ulcer of other part of right foot with unspecified severity: Secondary | ICD-10-CM | POA: Diagnosis not present

## 2018-12-14 DIAGNOSIS — E1140 Type 2 diabetes mellitus with diabetic neuropathy, unspecified: Secondary | ICD-10-CM | POA: Diagnosis not present

## 2018-12-14 DIAGNOSIS — Z6841 Body Mass Index (BMI) 40.0 and over, adult: Secondary | ICD-10-CM | POA: Diagnosis not present

## 2018-12-14 DIAGNOSIS — Z594 Lack of adequate food and safe drinking water: Secondary | ICD-10-CM | POA: Diagnosis not present

## 2018-12-14 DIAGNOSIS — Z4800 Encounter for change or removal of nonsurgical wound dressing: Secondary | ICD-10-CM | POA: Diagnosis not present

## 2018-12-14 DIAGNOSIS — L97529 Non-pressure chronic ulcer of other part of left foot with unspecified severity: Secondary | ICD-10-CM | POA: Diagnosis not present

## 2018-12-14 DIAGNOSIS — I872 Venous insufficiency (chronic) (peripheral): Secondary | ICD-10-CM | POA: Diagnosis not present

## 2018-12-14 DIAGNOSIS — E11610 Type 2 diabetes mellitus with diabetic neuropathic arthropathy: Secondary | ICD-10-CM | POA: Diagnosis not present

## 2018-12-14 DIAGNOSIS — E1151 Type 2 diabetes mellitus with diabetic peripheral angiopathy without gangrene: Secondary | ICD-10-CM | POA: Diagnosis not present

## 2018-12-14 NOTE — Telephone Encounter (Signed)
Talked with sister Carlean Purl and she has requested an in-person Palliative Consult.  Consult has been scheduled for 12/16/18 @ 12:30 PM.

## 2018-12-14 NOTE — Telephone Encounter (Signed)
Pls contact pt and let him know that I have agreed to Palliative care consultation and management moving forward. I believe that this will best serve him in his current situation. I wish him all the best

## 2018-12-14 NOTE — Telephone Encounter (Signed)
Received Palliative Care Referral.

## 2018-12-15 DIAGNOSIS — L97529 Non-pressure chronic ulcer of other part of left foot with unspecified severity: Secondary | ICD-10-CM | POA: Diagnosis not present

## 2018-12-15 DIAGNOSIS — Z6841 Body Mass Index (BMI) 40.0 and over, adult: Secondary | ICD-10-CM | POA: Diagnosis not present

## 2018-12-15 DIAGNOSIS — I872 Venous insufficiency (chronic) (peripheral): Secondary | ICD-10-CM | POA: Diagnosis not present

## 2018-12-15 DIAGNOSIS — Z4800 Encounter for change or removal of nonsurgical wound dressing: Secondary | ICD-10-CM | POA: Diagnosis not present

## 2018-12-15 DIAGNOSIS — Z594 Lack of adequate food and safe drinking water: Secondary | ICD-10-CM | POA: Diagnosis not present

## 2018-12-15 DIAGNOSIS — E1151 Type 2 diabetes mellitus with diabetic peripheral angiopathy without gangrene: Secondary | ICD-10-CM | POA: Diagnosis not present

## 2018-12-15 DIAGNOSIS — E1140 Type 2 diabetes mellitus with diabetic neuropathy, unspecified: Secondary | ICD-10-CM | POA: Diagnosis not present

## 2018-12-15 DIAGNOSIS — L97519 Non-pressure chronic ulcer of other part of right foot with unspecified severity: Secondary | ICD-10-CM | POA: Diagnosis not present

## 2018-12-15 DIAGNOSIS — E11610 Type 2 diabetes mellitus with diabetic neuropathic arthropathy: Secondary | ICD-10-CM | POA: Diagnosis not present

## 2018-12-15 NOTE — Telephone Encounter (Signed)
Called patient no answer.

## 2018-12-16 ENCOUNTER — Other Ambulatory Visit: Payer: Self-pay

## 2018-12-16 ENCOUNTER — Encounter: Payer: Self-pay | Admitting: Internal Medicine

## 2018-12-16 ENCOUNTER — Other Ambulatory Visit: Payer: Self-pay | Admitting: Family Medicine

## 2018-12-16 ENCOUNTER — Other Ambulatory Visit: Payer: Medicare HMO | Admitting: Internal Medicine

## 2018-12-16 DIAGNOSIS — Z515 Encounter for palliative care: Secondary | ICD-10-CM

## 2018-12-16 DIAGNOSIS — E1151 Type 2 diabetes mellitus with diabetic peripheral angiopathy without gangrene: Secondary | ICD-10-CM | POA: Diagnosis not present

## 2018-12-16 DIAGNOSIS — Z6841 Body Mass Index (BMI) 40.0 and over, adult: Secondary | ICD-10-CM | POA: Diagnosis not present

## 2018-12-16 DIAGNOSIS — L97519 Non-pressure chronic ulcer of other part of right foot with unspecified severity: Secondary | ICD-10-CM | POA: Diagnosis not present

## 2018-12-16 DIAGNOSIS — I872 Venous insufficiency (chronic) (peripheral): Secondary | ICD-10-CM | POA: Diagnosis not present

## 2018-12-16 DIAGNOSIS — E11610 Type 2 diabetes mellitus with diabetic neuropathic arthropathy: Secondary | ICD-10-CM | POA: Diagnosis not present

## 2018-12-16 DIAGNOSIS — L97529 Non-pressure chronic ulcer of other part of left foot with unspecified severity: Secondary | ICD-10-CM | POA: Diagnosis not present

## 2018-12-16 DIAGNOSIS — E1140 Type 2 diabetes mellitus with diabetic neuropathy, unspecified: Secondary | ICD-10-CM | POA: Diagnosis not present

## 2018-12-16 DIAGNOSIS — Z594 Lack of adequate food and safe drinking water: Secondary | ICD-10-CM | POA: Diagnosis not present

## 2018-12-16 DIAGNOSIS — Z4800 Encounter for change or removal of nonsurgical wound dressing: Secondary | ICD-10-CM | POA: Diagnosis not present

## 2018-12-16 NOTE — Progress Notes (Signed)
June 19th, 2020 [place] Note Telephone: [phone]  Fax: [phone]  PATIENT NAME: Nicholas [name] DOB: Feb 08, 1956 MRN: [id number] [facility], [address], [phone] [email or web address]  PRIMARY CARE PROVIDER:   Fayrene [name], [place] (office) [phone]  REFERRING PROVIDER:  Fayrene Helper, MD [address] [place],  [place] [address]  RESPONSIBLE PARTY: (sister) Nicholas [name] [phone]. E-mail:  [email or web address]  RECOMMENDATIONS and PLAN:  1.Cognitive / Functional decline d/t deconditioning, morbid obesity, and back pain: Patient is A & O x 3. He can self-transfer and ambulate about 15 feet via rollator walker (to bathroom and kitchen), though with increasing difficulty. He is continent of bowel and bladder. His last know weight (? date) was 435 lbs, but his sister reports he looks like he has lost weight since then. He has bilateral toes and R LE ulcers, which are being dressed  2 X / week by SN from [place]. Nurse Nicholas [name] was just completing a dressing change, with subsequent Kerlex and Coban wrap bilaterally toes to knees. She reports that the wounds are healing. Patient with chronic back pain, exacerbated with minimal movement.   2. Symptom Management:  A. Chronic Back Pain: Present for many years but exacerbated with past falls. Managed with Neurontin 600 mg bid, Tramadol 50 mg BID, Flexeril 10 mg qd prn, and Naproxen 500 mg bid. He is currently out of all but his Neurontin, as he took more than the prescribed dose d/t inadequate pain relief. Patients PCP Nicholas [name] had tried to arrange an appointment with a neurology group [facility] [phone]). I contacted their office. The nurse there mentioned they had seen the patient about 3 years ago. However, since patient is unable to travel to the office for an initial  evaluation visit (patient is homebound), they cannot see him (per their office policy), even by telehealth. Patients Tramadol, Flexeril, and Naproxen are up for renewal over the next few days; I called the pharmacy and ask that they call the PCP and ask for renewals. I discussed with the patient that he needs to more closely monitor the amount of pills dispensed within the time frame he has till next renewed, and follow that schedule so that he doesnt run out. We discussed the hesitancy of providers to prescribe opioids for back pain; that the long term benefits of pain relief are dismal, with an associated high risk for dependence/addiction. Patient understands and is not interested in opioids for his pain.   B. Decreased vision: Difficult for him to read the fine print on his prescription bottles. He compensates by taking a picture with his phone, then enlarging it.   3. Family / Community Supports: Patient is homebound; has not been able to leave his home for months d/t extreme pain with ambulation. He is on Medicare but because he receives $1300/month disability he makes too much to qualify for Medicaid. Patient has 10 siblings. Both his parents are deceased. He is close to his sister Nicholas [name], who checks in on him weekly and assists with buying his groceries. He has a twin sister who lives in [place]; not much contact. Patient worked as a Forensic scientist. He is followed by [place] Worker Nicholas [name] [phone]), and nurse Nicholas [name] [phone]) for LE dressing changes. Patient is grieving the loss of his little dog (died a few weeks ago) that he  owned for many years.   A. I provided a list of 3 providers who can make in-home visits:  Nicholas [name]: [phone], Nicholas Pel NP: [phone], and Nicholas Chou NP: [phone]. Patient plans to call and try to establish with one of them.  B. I spent a bit of time instructing patient how to use telehealth, for  future provider visits. We were able to complete this successfully and tested it out on his android phone.  [facility] nurse Nicholas [name] plans to call PCP and request referral for home PT/OT consult and treat.  4. Advanced Care Directive: Has Living Will (08/02/2017) in [place]. We discussed DNR; patient was firm in his wishes that he would not want CPR in the event of a cardio-pulmonary arrest. We discussed in some detail, and completed a MOST form. Details: DNR/DNI. Comfort level of care. Antibiotics and IVFs: determine at the time of need. No tube feeding. I left a completed copy of a DNR form (we discussed keeping it on the fridge as that is usually where EMS would look for it) and the MOST form. I uploads both these forms into the [facility] EMR [facility]).   5. Goals of Care:  A. To improve management of pain control.  -reviewed current regimen; discussed taking pills on a spaced out basis so he wouldnt run out prematurely. -plan to establish with PCP who will provide home visits. -Home PT request: I spoke with [place] who states she will contact office of PCP to request a referral.  6. Follow up Palliative Care Visit: call to schedule in 4 weeks (week of July 12th).  I spent 120 minutes providing this consultation, from 1 pm to 3 pm. More than 50% of the time in this consultation was spent coordinating communication.   HISTORY OF PRESENT ILLNESS:  Nicholas [name] is a 63 y.o. year old morbidly obese male with h/o DM2, HTN, HLD, depression, neuropathy chronic knee pain, and past suicide attempt.  Palliative Care was asked to help address goals of care.   CODE STATUS: DNR. MOST form completed.  PPS: weak 40%  HOSPICE ELIGIBILITY/DIAGNOSIS: No as prognosis thought to be greater than 6 months.  PAST MEDICAL HISTORY:  Past Medical History:  Diagnosis Date   Chronic knee pain    Depression    Diabetes (HCC)    Glaucoma    Hyperlipidemia     Hypertension    MRSA (methicillin resistant Staphylococcus aureus)    patient denies   Neuropathy    Poor circulation    leg    Suicide attempt ([place])    Testosterone deficiency     SOCIAL HX:  Social History   Tobacco Use   Smoking status: Former Smoker    Packs/day: 1.50    Years: 25.00    Pack years: 37.50    Types: Cigarettes    Quit date: 02/20/2002    Years since quitting: 16.8   Smokeless tobacco: Former Systems developer    Types: Snuff    Quit date: 11/02/1980   Tobacco comment: Quit x 15 years  Substance Use Topics   Alcohol use: No    ALLERGIES:  Allergies  Allergen Reactions   No Allergies On File    No Known Allergies      PERTINENT MEDICATIONS:  Outpatient Encounter Medications as of 12/16/2018  Medication Sig   anastrozole (ARIMIDEX) 1 MG tablet Take 1 mg by mouth daily.   aspirin EC 81 MG  tablet Take 81 mg by mouth every evening.    brimonidine-timolol (COMBIGAN) 0.2-0.5 % ophthalmic solution Place 1 drop into both eyes 2 (two) times daily.   buPROPion (WELLBUTRIN XL) 150 MG 24 hr tablet TAKE 1 TABLET BY MOUTH ONCE A DAY.   citalopram (CELEXA) 40 MG tablet TAKE 1 TABLET BY MOUTH ONCE A DAY.   cyclobenzaprine (FLEXERIL) 10 MG tablet TAKE (1) TABLET BY MOUTH EVERY EIGHT HOURS AS NEEDED FOR MUSCLE SPASMS.   gabapentin (NEURONTIN) 600 MG tablet TAKE ONE TABLET BY MOUTH 3 TIMES DAILY.   glucose blood test strip 1 each by Other route 4 (four) times daily. Use as instructed 4 x daily. Verio. E11.65   HUMULIN R U-500 KWIKPEN 500 UNIT/ML kwikpen INJECT UP TO 100 UNITS SUBCUTANEOUSLY 30 MINUTES BEFORE MEALS.   hydrochlorothiazide (HYDRODIURIL) 25 MG tablet TAKE 1 TABLET BY MOUTH DAILY.   Insulin Pen Needle (SURE COMFORT PEN NEEDLES) 31G X 8 MM MISC Inject 1 each into the skin 4 (four) times daily.   lamoTRIgine (LAMICTAL) 200 MG tablet TAKE 1 TABLET BY MOUTH ONCE A DAY.   levothyroxine (SYNTHROID, LEVOTHROID) 125 MCG tablet Take 1 tablet (125 mcg total) by  mouth daily before breakfast.   lisinopril (PRINIVIL,ZESTRIL) 5 MG tablet Take 1 tablet (5 mg total) by mouth daily.   metoprolol succinate (TOPROL-XL) 25 MG 24 hr tablet TAKE (1) TABLET BY MOUTH ONCE DAILY.   naproxen (NAPROSYN) 500 MG tablet TAKE ONE TABLET BY MOUTH TWICE DAILY WITH A MEAL.   NOVOFINE 30G X 8 MM MISC    pantoprazole (PROTONIX) 40 MG tablet TAKE (1) TABLET BY MOUTH TWICE DAILY.   predniSONE (STERAPRED UNI-PAK 21 TAB) 10 MG (21) TBPK tablet Use as directed, take tablets by mouth.   silver sulfADIAZINE (SILVADENE) 1 % cream Apply 1 application topically daily.   simvastatin (ZOCOR) 40 MG tablet TAKE 1 TABLET BY MOUTH ONCE A DAY.   traMADol (ULTRAM) 50 MG tablet TAKE 2 TABLETS BY MOUTH EVERY 8 HOURS AS NEEDED.   TRAVATAN Z 0.004 % SOLN ophthalmic solution Place 1 drop into both eyes at bedtime.    triamcinolone ointment (KENALOG) 0.1 % APPLY TO AFFECTED AREAS TWICE DAILY.   VICTOZA 18 MG/3ML SOPN INJECT 1.8 MG SUBCUTANEOUSLY ONCE DAILY.   Vitamin D, Ergocalciferol, (DRISDOL) 1.25 MG (50000 UT) CAPS capsule Take 1 capsule (50,000 Units total) by mouth once a week.   Facility-Administered Encounter Medications as of 12/16/2018  Medication   triamcinolone acetonide (KENALOG-40) injection 20 mg    PHYSICAL EXAM:   General: Obese, pleasantly conversant male seated in recliner  Limited physical exam d/t COVID-19 crisis Extremities: mild bilateral LE edema. Bilateral LE's wrapped toes to knees.  Skin: no rashes Neurological: Weakness but otherwise nonfocal  Julianne Handler, NP

## 2018-12-20 ENCOUNTER — Telehealth: Payer: Self-pay | Admitting: *Deleted

## 2018-12-20 DIAGNOSIS — Z6841 Body Mass Index (BMI) 40.0 and over, adult: Secondary | ICD-10-CM | POA: Diagnosis not present

## 2018-12-20 DIAGNOSIS — I509 Heart failure, unspecified: Secondary | ICD-10-CM | POA: Diagnosis not present

## 2018-12-20 DIAGNOSIS — E1151 Type 2 diabetes mellitus with diabetic peripheral angiopathy without gangrene: Secondary | ICD-10-CM | POA: Diagnosis not present

## 2018-12-20 DIAGNOSIS — L97519 Non-pressure chronic ulcer of other part of right foot with unspecified severity: Secondary | ICD-10-CM | POA: Diagnosis not present

## 2018-12-20 DIAGNOSIS — I872 Venous insufficiency (chronic) (peripheral): Secondary | ICD-10-CM | POA: Diagnosis not present

## 2018-12-20 DIAGNOSIS — Z4800 Encounter for change or removal of nonsurgical wound dressing: Secondary | ICD-10-CM | POA: Diagnosis not present

## 2018-12-20 DIAGNOSIS — R69 Illness, unspecified: Secondary | ICD-10-CM | POA: Diagnosis not present

## 2018-12-20 DIAGNOSIS — E11610 Type 2 diabetes mellitus with diabetic neuropathic arthropathy: Secondary | ICD-10-CM | POA: Diagnosis not present

## 2018-12-20 DIAGNOSIS — Z594 Lack of adequate food and safe drinking water: Secondary | ICD-10-CM | POA: Diagnosis not present

## 2018-12-20 DIAGNOSIS — E1140 Type 2 diabetes mellitus with diabetic neuropathy, unspecified: Secondary | ICD-10-CM | POA: Diagnosis not present

## 2018-12-20 DIAGNOSIS — I110 Hypertensive heart disease with heart failure: Secondary | ICD-10-CM | POA: Diagnosis not present

## 2018-12-20 DIAGNOSIS — L97529 Non-pressure chronic ulcer of other part of left foot with unspecified severity: Secondary | ICD-10-CM | POA: Diagnosis not present

## 2018-12-20 NOTE — Telephone Encounter (Signed)
Can he get a refill of tramadol, flexeril and naproxen? They had 0 refills

## 2018-12-20 NOTE — Telephone Encounter (Signed)
Colletta [place] with [facility] called requesting an evaluation for PT and a Home health Aide to help with Mr. Printy bathing. Can be reached at [phone]

## 2018-12-20 NOTE — Telephone Encounter (Signed)
Gave verbal ok for home health aid to assist in bathing and evaluation for PT

## 2018-12-21 DIAGNOSIS — L97319 Non-pressure chronic ulcer of right ankle with unspecified severity: Secondary | ICD-10-CM | POA: Diagnosis not present

## 2018-12-21 DIAGNOSIS — L97519 Non-pressure chronic ulcer of other part of right foot with unspecified severity: Secondary | ICD-10-CM | POA: Diagnosis not present

## 2018-12-21 DIAGNOSIS — L97219 Non-pressure chronic ulcer of right calf with unspecified severity: Secondary | ICD-10-CM | POA: Diagnosis not present

## 2018-12-21 DIAGNOSIS — I872 Venous insufficiency (chronic) (peripheral): Secondary | ICD-10-CM | POA: Diagnosis not present

## 2018-12-21 DIAGNOSIS — L97529 Non-pressure chronic ulcer of other part of left foot with unspecified severity: Secondary | ICD-10-CM | POA: Diagnosis not present

## 2018-12-21 DIAGNOSIS — E1151 Type 2 diabetes mellitus with diabetic peripheral angiopathy without gangrene: Secondary | ICD-10-CM | POA: Diagnosis not present

## 2018-12-21 DIAGNOSIS — L97221 Non-pressure chronic ulcer of left calf limited to breakdown of skin: Secondary | ICD-10-CM | POA: Diagnosis not present

## 2018-12-22 ENCOUNTER — Other Ambulatory Visit: Payer: Self-pay | Admitting: Family Medicine

## 2018-12-22 DIAGNOSIS — Z594 Lack of adequate food and safe drinking water: Secondary | ICD-10-CM | POA: Diagnosis not present

## 2018-12-22 DIAGNOSIS — I739 Peripheral vascular disease, unspecified: Secondary | ICD-10-CM | POA: Diagnosis not present

## 2018-12-22 DIAGNOSIS — Z0000 Encounter for general adult medical examination without abnormal findings: Secondary | ICD-10-CM | POA: Diagnosis not present

## 2018-12-22 DIAGNOSIS — I509 Heart failure, unspecified: Secondary | ICD-10-CM | POA: Diagnosis not present

## 2018-12-22 DIAGNOSIS — Z4800 Encounter for change or removal of nonsurgical wound dressing: Secondary | ICD-10-CM

## 2018-12-22 DIAGNOSIS — Z6841 Body Mass Index (BMI) 40.0 and over, adult: Secondary | ICD-10-CM | POA: Diagnosis not present

## 2018-12-22 DIAGNOSIS — E11610 Type 2 diabetes mellitus with diabetic neuropathic arthropathy: Secondary | ICD-10-CM | POA: Diagnosis not present

## 2018-12-22 DIAGNOSIS — L97529 Non-pressure chronic ulcer of other part of left foot with unspecified severity: Secondary | ICD-10-CM | POA: Diagnosis not present

## 2018-12-22 DIAGNOSIS — Z7189 Other specified counseling: Secondary | ICD-10-CM | POA: Diagnosis not present

## 2018-12-22 DIAGNOSIS — I10 Essential (primary) hypertension: Secondary | ICD-10-CM | POA: Diagnosis not present

## 2018-12-22 DIAGNOSIS — E1151 Type 2 diabetes mellitus with diabetic peripheral angiopathy without gangrene: Secondary | ICD-10-CM | POA: Diagnosis not present

## 2018-12-22 DIAGNOSIS — E1165 Type 2 diabetes mellitus with hyperglycemia: Secondary | ICD-10-CM | POA: Diagnosis not present

## 2018-12-22 DIAGNOSIS — E1140 Type 2 diabetes mellitus with diabetic neuropathy, unspecified: Secondary | ICD-10-CM | POA: Diagnosis not present

## 2018-12-22 DIAGNOSIS — L97909 Non-pressure chronic ulcer of unspecified part of unspecified lower leg with unspecified severity: Secondary | ICD-10-CM | POA: Diagnosis not present

## 2018-12-22 DIAGNOSIS — I872 Venous insufficiency (chronic) (peripheral): Secondary | ICD-10-CM | POA: Diagnosis not present

## 2018-12-22 DIAGNOSIS — L97519 Non-pressure chronic ulcer of other part of right foot with unspecified severity: Secondary | ICD-10-CM | POA: Diagnosis not present

## 2018-12-22 MED ORDER — TRAMADOL HCL 50 MG PO TABS: ORAL_TABLET | ORAL | 0 refills | Status: AC

## 2018-12-22 MED ORDER — CYCLOBENZAPRINE HCL 10 MG PO TABS: ORAL_TABLET | ORAL | 3 refills | Status: AC

## 2018-12-22 NOTE — Progress Notes (Unsigned)
Tramadol 50 mg

## 2018-12-23 DIAGNOSIS — Z4800 Encounter for change or removal of nonsurgical wound dressing: Secondary | ICD-10-CM | POA: Diagnosis not present

## 2018-12-23 DIAGNOSIS — I872 Venous insufficiency (chronic) (peripheral): Secondary | ICD-10-CM | POA: Diagnosis not present

## 2018-12-23 DIAGNOSIS — I110 Hypertensive heart disease with heart failure: Secondary | ICD-10-CM | POA: Diagnosis not present

## 2018-12-23 DIAGNOSIS — L97519 Non-pressure chronic ulcer of other part of right foot with unspecified severity: Secondary | ICD-10-CM | POA: Diagnosis not present

## 2018-12-23 DIAGNOSIS — Z594 Lack of adequate food and safe drinking water: Secondary | ICD-10-CM | POA: Diagnosis not present

## 2018-12-23 DIAGNOSIS — E1140 Type 2 diabetes mellitus with diabetic neuropathy, unspecified: Secondary | ICD-10-CM | POA: Diagnosis not present

## 2018-12-23 DIAGNOSIS — Z6841 Body Mass Index (BMI) 40.0 and over, adult: Secondary | ICD-10-CM | POA: Diagnosis not present

## 2018-12-23 DIAGNOSIS — L97529 Non-pressure chronic ulcer of other part of left foot with unspecified severity: Secondary | ICD-10-CM | POA: Diagnosis not present

## 2018-12-23 DIAGNOSIS — E11610 Type 2 diabetes mellitus with diabetic neuropathic arthropathy: Secondary | ICD-10-CM | POA: Diagnosis not present

## 2018-12-23 NOTE — Telephone Encounter (Signed)
Called patient and left message for them to return call at the office   

## 2018-12-25 DIAGNOSIS — E1151 Type 2 diabetes mellitus with diabetic peripheral angiopathy without gangrene: Secondary | ICD-10-CM | POA: Diagnosis not present

## 2018-12-25 DIAGNOSIS — Z594 Lack of adequate food and safe drinking water: Secondary | ICD-10-CM | POA: Diagnosis not present

## 2018-12-25 DIAGNOSIS — E11610 Type 2 diabetes mellitus with diabetic neuropathic arthropathy: Secondary | ICD-10-CM | POA: Diagnosis not present

## 2018-12-25 DIAGNOSIS — E1140 Type 2 diabetes mellitus with diabetic neuropathy, unspecified: Secondary | ICD-10-CM | POA: Diagnosis not present

## 2018-12-25 DIAGNOSIS — I872 Venous insufficiency (chronic) (peripheral): Secondary | ICD-10-CM | POA: Diagnosis not present

## 2018-12-25 DIAGNOSIS — Z6841 Body Mass Index (BMI) 40.0 and over, adult: Secondary | ICD-10-CM | POA: Diagnosis not present

## 2018-12-25 DIAGNOSIS — Z4800 Encounter for change or removal of nonsurgical wound dressing: Secondary | ICD-10-CM | POA: Diagnosis not present

## 2018-12-25 DIAGNOSIS — L97519 Non-pressure chronic ulcer of other part of right foot with unspecified severity: Secondary | ICD-10-CM | POA: Diagnosis not present

## 2018-12-25 DIAGNOSIS — L97529 Non-pressure chronic ulcer of other part of left foot with unspecified severity: Secondary | ICD-10-CM | POA: Diagnosis not present

## 2018-12-26 ENCOUNTER — Telehealth: Payer: Self-pay | Admitting: Family Medicine

## 2018-12-26 DIAGNOSIS — L97529 Non-pressure chronic ulcer of other part of left foot with unspecified severity: Secondary | ICD-10-CM | POA: Diagnosis not present

## 2018-12-26 DIAGNOSIS — L97219 Non-pressure chronic ulcer of right calf with unspecified severity: Secondary | ICD-10-CM | POA: Diagnosis not present

## 2018-12-26 DIAGNOSIS — L97519 Non-pressure chronic ulcer of other part of right foot with unspecified severity: Secondary | ICD-10-CM | POA: Diagnosis not present

## 2018-12-26 DIAGNOSIS — E1151 Type 2 diabetes mellitus with diabetic peripheral angiopathy without gangrene: Secondary | ICD-10-CM | POA: Diagnosis not present

## 2018-12-26 DIAGNOSIS — I872 Venous insufficiency (chronic) (peripheral): Secondary | ICD-10-CM | POA: Diagnosis not present

## 2018-12-26 DIAGNOSIS — E1140 Type 2 diabetes mellitus with diabetic neuropathy, unspecified: Secondary | ICD-10-CM | POA: Diagnosis not present

## 2018-12-26 DIAGNOSIS — Z4800 Encounter for change or removal of nonsurgical wound dressing: Secondary | ICD-10-CM | POA: Diagnosis not present

## 2018-12-26 DIAGNOSIS — L97319 Non-pressure chronic ulcer of right ankle with unspecified severity: Secondary | ICD-10-CM | POA: Diagnosis not present

## 2018-12-26 DIAGNOSIS — L97221 Non-pressure chronic ulcer of left calf limited to breakdown of skin: Secondary | ICD-10-CM | POA: Diagnosis not present

## 2018-12-26 DIAGNOSIS — Z6841 Body Mass Index (BMI) 40.0 and over, adult: Secondary | ICD-10-CM | POA: Diagnosis not present

## 2018-12-26 DIAGNOSIS — E11610 Type 2 diabetes mellitus with diabetic neuropathic arthropathy: Secondary | ICD-10-CM | POA: Diagnosis not present

## 2018-12-26 DIAGNOSIS — Z594 Lack of adequate food and safe drinking water: Secondary | ICD-10-CM | POA: Diagnosis not present

## 2018-12-26 NOTE — Telephone Encounter (Signed)
Pt receiving palliative care services and cancelled his f/u appt per [facility]

## 2018-12-26 NOTE — Telephone Encounter (Signed)
Nicholas [name], lvm that she needs verbal orders for skilled nursing

## 2018-12-26 NOTE — Telephone Encounter (Signed)
Left message giving verbal orders for skilled nursing.

## 2018-12-27 ENCOUNTER — Ambulatory Visit: Payer: Medicare HMO | Admitting: Family Medicine

## 2018-12-30 DIAGNOSIS — I469 Cardiac arrest, cause unspecified: Secondary | ICD-10-CM | POA: Diagnosis not present

## 2018-12-30 DIAGNOSIS — R4020 Unspecified coma: Secondary | ICD-10-CM | POA: Diagnosis not present

## 2018-12-30 DIAGNOSIS — R402431 Glasgow coma scale score 3-8, in the field [EMT or ambulance]: Secondary | ICD-10-CM | POA: Diagnosis not present

## 2018-12-30 DIAGNOSIS — R404 Transient alteration of awareness: Secondary | ICD-10-CM | POA: Diagnosis not present

## 2019-01-10 ENCOUNTER — Telehealth: Payer: Self-pay | Admitting: Internal Medicine

## 2019-01-28 DIAGNOSIS — 419620001 Death: Secondary | SNOMED CT | POA: Diagnosis not present

## 2019-01-28 DEATH — deceased

## 2019-03-24 NOTE — Telephone Encounter (Signed)
Opened in error; no note

## 2019-04-12 ENCOUNTER — Telehealth: Payer: Self-pay | Admitting: *Deleted

## 2019-04-12 NOTE — Telephone Encounter (Signed)
Sister Hilda Blades (on [place]) called in after we left a voicemail checking on pt as he hasnt been in in awhile. She said pt committed suicide 12/29/2018.

## 2019-04-13 NOTE — Telephone Encounter (Signed)
Noted , I was unaware

## 2019-06-14 ENCOUNTER — Encounter: Payer: Self-pay | Admitting: Internal Medicine

## 2020-07-31 ENCOUNTER — Encounter: Payer: Self-pay | Admitting: Internal Medicine

## 2023-08-03 NOTE — Progress Notes (Signed)
RFC
